# Patient Record
Sex: Male | Born: 1950 | Race: White | Hispanic: No | Smoking: Never smoker
Health system: Southern US, Community
[De-identification: ages and names within clinical notes are randomized; demographics above are authoritative.]

## PROBLEM LIST (undated history)

## (undated) DIAGNOSIS — M4696 Unspecified inflammatory spondylopathy, lumbar region: Secondary | ICD-10-CM

## (undated) DIAGNOSIS — F419 Anxiety disorder, unspecified: Secondary | ICD-10-CM

## (undated) DIAGNOSIS — R040 Epistaxis: Secondary | ICD-10-CM

## (undated) DIAGNOSIS — K219 Gastro-esophageal reflux disease without esophagitis: Secondary | ICD-10-CM

## (undated) DIAGNOSIS — I639 Cerebral infarction, unspecified: Secondary | ICD-10-CM

## (undated) DIAGNOSIS — Z8719 Personal history of other diseases of the digestive system: Secondary | ICD-10-CM

## (undated) DIAGNOSIS — F1021 Alcohol dependence, in remission: Secondary | ICD-10-CM

## (undated) DIAGNOSIS — S42302A Unspecified fracture of shaft of humerus, left arm, initial encounter for closed fracture: Secondary | ICD-10-CM

## (undated) DIAGNOSIS — N318 Other neuromuscular dysfunction of bladder: Secondary | ICD-10-CM

## (undated) DIAGNOSIS — I513 Intracardiac thrombosis, not elsewhere classified: Secondary | ICD-10-CM

## (undated) DIAGNOSIS — M810 Age-related osteoporosis without current pathological fracture: Secondary | ICD-10-CM

## (undated) DIAGNOSIS — F1027 Alcohol dependence with alcohol-induced persisting dementia: Secondary | ICD-10-CM

## (undated) DIAGNOSIS — K7010 Alcoholic hepatitis without ascites: Secondary | ICD-10-CM

## (undated) DIAGNOSIS — G8929 Other chronic pain: Secondary | ICD-10-CM

## (undated) DIAGNOSIS — F1910 Other psychoactive substance abuse, uncomplicated: Secondary | ICD-10-CM

## (undated) DIAGNOSIS — F325 Major depressive disorder, single episode, in full remission: Secondary | ICD-10-CM

## (undated) HISTORY — DX: Major depressive disorder, single episode, in full remission: F32.5

## (undated) HISTORY — DX: Personal history of other diseases of the digestive system: Z87.19

## (undated) HISTORY — DX: Age-related osteoporosis without current pathological fracture: M81.0

## (undated) HISTORY — DX: Alcohol dependence with alcohol-induced persisting dementia: F10.27

---

## 1994-03-10 HISTORY — PX: KNEE SURGERY: SHX244

## 1999-03-11 DIAGNOSIS — R040 Epistaxis: Secondary | ICD-10-CM

## 1999-03-11 DIAGNOSIS — F1910 Other psychoactive substance abuse, uncomplicated: Secondary | ICD-10-CM

## 1999-03-11 DIAGNOSIS — K7010 Alcoholic hepatitis without ascites: Secondary | ICD-10-CM

## 1999-03-11 HISTORY — DX: Other psychoactive substance abuse, uncomplicated: F19.10

## 1999-03-11 HISTORY — DX: Epistaxis: R04.0

## 1999-03-11 HISTORY — DX: Alcoholic hepatitis without ascites: K70.10

## 1999-05-07 ENCOUNTER — Encounter: Payer: Self-pay | Admitting: Emergency Medicine

## 1999-05-07 ENCOUNTER — Emergency Department (HOSPITAL_COMMUNITY): Admission: EM | Admit: 1999-05-07 | Discharge: 1999-05-07 | Payer: Self-pay | Admitting: Emergency Medicine

## 1999-05-08 ENCOUNTER — Inpatient Hospital Stay (HOSPITAL_COMMUNITY): Admission: EM | Admit: 1999-05-08 | Discharge: 1999-05-10 | Payer: Self-pay | Admitting: Emergency Medicine

## 2001-03-10 DIAGNOSIS — I513 Intracardiac thrombosis, not elsewhere classified: Secondary | ICD-10-CM

## 2001-03-10 HISTORY — PX: HAND SURGERY: SHX662

## 2001-03-10 HISTORY — DX: Intracardiac thrombosis, not elsewhere classified: I51.3

## 2001-06-01 ENCOUNTER — Encounter: Payer: Self-pay | Admitting: Emergency Medicine

## 2001-06-01 ENCOUNTER — Inpatient Hospital Stay (HOSPITAL_COMMUNITY): Admission: EM | Admit: 2001-06-01 | Discharge: 2001-06-04 | Payer: Self-pay | Admitting: Emergency Medicine

## 2001-06-12 ENCOUNTER — Ambulatory Visit (HOSPITAL_COMMUNITY): Admission: RE | Admit: 2001-06-12 | Discharge: 2001-06-13 | Payer: Self-pay | Admitting: Orthopedic Surgery

## 2001-06-24 ENCOUNTER — Inpatient Hospital Stay (HOSPITAL_COMMUNITY): Admission: EM | Admit: 2001-06-24 | Discharge: 2001-07-02 | Payer: Self-pay | Admitting: Emergency Medicine

## 2001-06-24 ENCOUNTER — Encounter: Payer: Self-pay | Admitting: Emergency Medicine

## 2001-06-25 ENCOUNTER — Encounter: Payer: Self-pay | Admitting: Internal Medicine

## 2001-09-24 ENCOUNTER — Encounter: Payer: Self-pay | Admitting: Emergency Medicine

## 2001-09-24 ENCOUNTER — Inpatient Hospital Stay (HOSPITAL_COMMUNITY): Admission: EM | Admit: 2001-09-24 | Discharge: 2001-10-04 | Payer: Self-pay | Admitting: Emergency Medicine

## 2001-09-25 ENCOUNTER — Encounter: Payer: Self-pay | Admitting: Internal Medicine

## 2001-09-27 ENCOUNTER — Encounter: Payer: Self-pay | Admitting: Cardiovascular Disease

## 2001-10-04 ENCOUNTER — Inpatient Hospital Stay (HOSPITAL_COMMUNITY)
Admission: RE | Admit: 2001-10-04 | Discharge: 2001-10-13 | Payer: Self-pay | Admitting: Physical Medicine & Rehabilitation

## 2006-12-05 ENCOUNTER — Emergency Department (HOSPITAL_COMMUNITY): Admission: EM | Admit: 2006-12-05 | Discharge: 2006-12-05 | Payer: Self-pay | Admitting: Emergency Medicine

## 2007-12-16 ENCOUNTER — Emergency Department (HOSPITAL_COMMUNITY): Admission: EM | Admit: 2007-12-16 | Discharge: 2007-12-16 | Payer: Self-pay | Admitting: Emergency Medicine

## 2008-03-21 DIAGNOSIS — I6350 Cerebral infarction due to unspecified occlusion or stenosis of unspecified cerebral artery: Secondary | ICD-10-CM

## 2008-03-21 DIAGNOSIS — R32 Unspecified urinary incontinence: Secondary | ICD-10-CM | POA: Insufficient documentation

## 2009-07-25 ENCOUNTER — Emergency Department (HOSPITAL_COMMUNITY): Admission: EM | Admit: 2009-07-25 | Discharge: 2009-07-25 | Payer: Self-pay | Admitting: Emergency Medicine

## 2010-01-02 ENCOUNTER — Ambulatory Visit: Payer: Self-pay | Admitting: Nurse Practitioner

## 2010-01-02 DIAGNOSIS — F1097 Alcohol use, unspecified with alcohol-induced persisting dementia: Secondary | ICD-10-CM

## 2010-01-02 DIAGNOSIS — R630 Anorexia: Secondary | ICD-10-CM

## 2010-01-08 ENCOUNTER — Ambulatory Visit: Payer: Self-pay | Admitting: Nurse Practitioner

## 2010-01-08 DIAGNOSIS — R209 Unspecified disturbances of skin sensation: Secondary | ICD-10-CM

## 2010-01-08 LAB — CONVERTED CEMR LAB
Basophils Absolute: 0 10*3/uL (ref 0.0–0.1)
Basophils Relative: 0 % (ref 0–1)
Cholesterol: 195 mg/dL (ref 0–200)
Eosinophils Absolute: 0.1 10*3/uL (ref 0.0–0.7)
Eosinophils Relative: 1 % (ref 0–5)
HCT: 48.6 % (ref 39.0–52.0)
HDL: 51 mg/dL (ref >39)
Hemoglobin: 16.1 g/dL (ref 13.0–17.0)
LDL Cholesterol: 126 mg/dL — ABNORMAL HIGH (ref 0–99)
Lymphocytes Relative: 35 % (ref 12–46)
Lymphs Abs: 1.8 10*3/uL (ref 0.7–4.0)
MCHC: 33.1 g/dL (ref 30.0–36.0)
MCV: 87.6 fL (ref 78.0–100.0)
Monocytes Absolute: 0.4 10*3/uL (ref 0.1–1.0)
Monocytes Relative: 7 % (ref 3–12)
Neutro Abs: 2.9 10*3/uL (ref 1.7–7.7)
Neutrophils Relative %: 56 % (ref 43–77)
Platelets: 196 10*3/uL (ref 150–400)
RBC: 5.55 M/uL (ref 4.22–5.81)
RDW: 13.3 % (ref 11.5–15.5)
TSH: 2.058 microintl units/mL (ref 0.350–4.500)
Total CHOL/HDL Ratio: 3.8
Triglycerides: 91 mg/dL (ref <150)
VLDL: 18 mg/dL (ref 0–40)
Vitamin B-12: 378 pg/mL (ref 211–911)
WBC: 5.1 10*3/uL (ref 4.0–10.5)

## 2010-01-09 ENCOUNTER — Encounter (INDEPENDENT_AMBULATORY_CARE_PROVIDER_SITE_OTHER): Payer: Self-pay | Admitting: Nurse Practitioner

## 2010-01-10 ENCOUNTER — Ambulatory Visit: Payer: Self-pay | Admitting: Nurse Practitioner

## 2010-01-10 DIAGNOSIS — I959 Hypotension, unspecified: Secondary | ICD-10-CM

## 2010-01-15 ENCOUNTER — Encounter (INDEPENDENT_AMBULATORY_CARE_PROVIDER_SITE_OTHER): Payer: Self-pay | Admitting: Nurse Practitioner

## 2010-02-20 ENCOUNTER — Encounter (INDEPENDENT_AMBULATORY_CARE_PROVIDER_SITE_OTHER): Payer: Self-pay | Admitting: Nurse Practitioner

## 2010-04-11 NOTE — Letter (Signed)
Summary: PT INFORMATION SHEET  PT INFORMATION SHEET   Imported By: Arta Bruce 01/03/2010 14:38:12  _____________________________________________________________________  External Attachment:    Type:   Image     Comment:   External Document

## 2010-04-11 NOTE — Letter (Signed)
Summary: Lipid Letter  [facility]  [address] [place], [place] [address]   Phone: [phone]  Fax: [phone]    01/09/2010  [name] [address] [place], [place]  [address]  Dear Steven [name]:  We have carefully reviewed your last lipid profile from 01/08/2010 and the results are noted below with a summary of recommendations for lipid management.    Cholesterol:       195     Goal: less than 200   HDL "good" Cholesterol:   51     Goal: greater than 40   LDL "bad" Cholesterol:   126     Goal: less than 130   Triglycerides:       91     Goal: less than 150    Labs done during recent office visit are normal.     Current Medications:  None If you have any questions, please call. We appreciate being able to work with you.   Sincerely,     Lehman Prom, FNP [facility]

## 2010-04-11 NOTE — Miscellaneous (Addendum)
Summary: RESCANNED W/DATES/AUTHORIZATION FOR HEALTH INFORMATION  RESCANNED W/DATES/AUTHORIZATION FOR HEALTH INFORMATION   Imported By: Arta Bruce 01/02/2010 15:53:34  _____________________________________________________________________  External Attachments:     1. Type:   Image          Comment:   External Document    2. Type:   Image          Comment:   External Document

## 2010-04-11 NOTE — Assessment & Plan Note (Signed)
Summary: S/P Fall  Nurse Visit   Vital Signs:  Patient profile:   60 year old male Temp:     97.6 degrees F oral Pulse rate:   108 / minute Pulse rhythm:   regular Resp:     20 per minute BP sitting:   96 / 62  (right arm) Cuff size:   regular  Vitals Entered By: Dutch Quint RN (January 10, 2010 10:24 AM)  CC:  S/p fall and injured ear.  History of Present Illness: Was here to have PPD read, sister states that pt. had fall night before last, injuring left ear.     Review of Systems Derm:  Complains of changes in color of skin; Bruised left ear with bleeding to external ear.   Sister states that he refused to allow ice or treatment to ear.Marland Kitchen   Physical Exam  Ears:  Left pinna has denuded area, actively bleeding.  Entire ear is bruised with some swelling.  Direct pressure applied and bleeding stopped. Lungs:  normal respiratory effort, no crackles, no wheezes, and L base dullness.    CC: S/p fall, injured ear Is Patient Diabetic? No Pain Assessment Patient in pain? yes     Location: left ear Type: sharp Onset of pain  s/p fall 01/08/10  Does patient need assistance? Ambulation Impaired:Risk for fall Comments Uses cane sporadically   Patient Instructions: 1)  Seen by Jesse Fall 2)  Tetanus vaccine given 3)  PPD was negative 4)  Blood pressure was low -- make sure you stand up and change position slowly; that could create another fall if your blood pressure drops too low. 5)  You are taking Plavix, which could cause increased bleeding.  Apply direct pressure to the ear if it starts to bleed again. 6)  You can take Tylenol regular strength 325 mg. two tabs by mouth every 4-6 hours.  If you take Extra Strength Tylenol 500 mg., take only one tablet.   7)  This office will let you know when your form is complete. 8)  Call if anything changes or if you have any questions.   Impression & Recommendations:  Problem # 1:  ACCIDENTAL FALL (ICD-E888.9) Trauma to left ear,   denuded area bleeding, stopped with direct pressure.  Taking Plavix.  Had been given aspirin for headache by sister, advised to use Tylenol.  Problem # 2:  HYPOTENSION (ICD-458.9) BP is low -- may be what contributed to fall Advised on care with postural changes  Complete Medication List: 1)  Plavix 75 Mg Tabs (Clopidogrel bisulfate) .... Take one tab daily 2)  Vesicare 5 Mg Tabs (Solifenacin succinate)  Other Orders: Tdap => 32yrs IM ([address]) Admin 1st Vaccine ([address])   Allergies: 1)  ! Penicillin  Immunizations Administered:  Tetanus Vaccine:    Vaccine Type: Tdap    Site: left deltoid    Mfr: Sanofi Pasteur    Dose: 0.5 ml    Route: IM    Given by: Dutch Quint RN    Exp. Date: 03/27/2012    Lot #: [id number]    VIS given: 01/26/08 version given January 10, 2010.  Orders Added: 1)  Est. Patient Level II [99212] 2)  Tdap => 47yrs IM [90715] 3)  Admin 1st Vaccine [47829]

## 2010-04-11 NOTE — Assessment & Plan Note (Signed)
Summary: NEW - Establish Care   Vital Signs:  Patient profile:   60 year old male Height:      62 inches Weight:      138 pounds BMI:     25.33 Temp:     98.1 degrees F oral Pulse rate:   88 / minute Pulse rhythm:   regular Resp:     18 per minute BP sitting:   110 / 84  (left arm) Cuff size:   regular  Vitals Entered By: Arm[place] Shannon (January 02, 2010 2:12 PM)  Nutrition Counseling: Patient's BMI is greater than 25 and therefore counseled on weight management options. CC: pt sister would like pt to be placed in an assist living...  pt needs a tb skin test... sister would like pt's b12 checked... sister says he takes vesicare and paxil but did not bring meds... Is Patient Diabetic? No Pain Assessment Patient in pain? no       Does patient need assistance? Functional Status Self care Ambulation Impaired:Risk for fall   CC:  pt sister would like pt to be placed in an assist living...  pt needs a tb skin test... sister would like pt's b12 checked... sister says he takes vesicare and paxil but did not bring meds....  History of Present Illness:  Pt into the office to establish care. No permanent PCP - he has been seeing several [facility] in [place] He was living at home and had some caretakers who the sister reports was taking him to several doctors.  Sister present with pt today and she states that she wants to get pt into the Assisted LIving. He is now living with her at this time. reports that he has a recent history of ETOH and drug use in his previous residence she moved him with her to separate him from that environment  Pt did not bring his medication into the office today She is unsure of the pt's history  Social - History of ETOH abuse but is now down to drinking 2 beers every other day (sister monitors ETOH use) Pt has a motorized wheelchair but did not present today with it in the office.  He does have a cane that he uses when he can. Pt does prefers to walk  rather than use the wheelchair however he has had several falls over the past few months Pt does need help getting dressed.  Habits & Providers  Alcohol-Tobacco-Diet     Alcohol drinks/day: 2     Alcohol Counseling: to decrease amount and/or frequency of alcohol intake     Alcohol type: beer     Tobacco Status: never  Exercise-Depression-Behavior     Does Patient Exercise: no     Have you felt down or hopeless? no     Have you felt little pleasure in things? no     Depression Counseling: not indicated; screening negative for depression     Drug Use: past  Allergies: 1)  ! Penicillin  Family History: mother - diabetes (deceased at age 54) father - diabetes (alive at 9) sister - htn  Social History: none ETOH - 2 beers per day Drug - previous cocaine use (last 2010) Tobacco - none Smoking Status:  never Does Patient Exercise:  no Drug Use:  past  Review of Systems General:  Complains of loss of appetite; "I'm just not hungry" Sister prepares breakfast and supper.  he can fix his own food during the day. . CV:  Denies chest pain or  discomfort. Resp:  Complains of cough; intermittent. GI:  Complains of diarrhea; sister has restriced chocolate and whole milk and diarrhea has improved. GU:  Complains of incontinence; wears incontinence pads. Neuro:  Complains of memory loss; denies numbness and tingling; some days pt has problems with memory - he is not able to recall the names of people in the family.  Physical Exam  General:  alert.   Eyes:  glasses Lungs:  normal breath sounds.   Heart:  normal rate and regular rhythm.   Neurologic:  cane use left sided weakness (hemiparesis) hobbling gait   Impression & Recommendations:  Problem # 1:  CVA (ICD-434.91) need to get full history from previous PCP  Problem # 2:  LOSS OF APPETITE (ICD-783.0) ? if due to hx of substance abuse will continue to workup  Problem # 3:  NEED PROPHYLACTIC VACCINATION&INOCULATION FLU  (ICD-V04.81) given today in office  Other Orders: Flu Vaccine 19yrs + ([address]) Admin 1st Vaccine ([address])  Patient Instructions: 1)  Sign a release to get records from [facility] 2)  Sign a release to get records from office on [address] 3)  I need the records from the previous providers before I can complete the FL2 form with this history and meds 4)  Schedule a nurse visit on Tuesday morning - will need to PPD 5)   come fasting for labs (no food after midnight before this visit). 6)  Will need lipids, cbc, cmp, tsh, vit b 12, rapid hiv 7)  Bring all your bottles of medications into this office 8)  Schedule nurse visit on Thursday or Friday morning for PPD reading.   Orders Added: 1)  Flu Vaccine 39yrs + [90658] 2)  Admin 1st Vaccine [90471] 3)  New Patient Level III [99203]   Immunizations Administered:  Influenza Vaccine # 1:    Vaccine Type: Fluvax 3+    Site: right deltoid    Mfr: GlaxoSmithKline    Dose: 0.5 ml    Route: IM    Given by: Levon Hedger    Exp. Date: 09/07/2010    Lot #: [id number]    VIS given: 10/02/09 version given January 02, 2010.  Flu Vaccine Consent Questions:    Do you have a history of severe allergic reactions to this vaccine? no    Any prior history of allergic reactions to egg and/or gelatin? no    Do you have a sensitivity to the preservative Thimersol? no    Do you have a past history of Guillan-Barre Syndrome? no    Do you currently have an acute febrile illness? no    Have you ever had a severe reaction to latex? no    Vaccine information given and explained to patient? yes    ndc [phone]  Immunizations Administered:  Influenza Vaccine # 1:    Vaccine Type: Fluvax 3+    Site: right deltoid    Mfr: GlaxoSmithKline    Dose: 0.5 ml    Route: IM    Given by: Levon Hedger    Exp. Date: 09/07/2010    Lot #: [id number]    VIS given: 10/02/09 version given January 02, 2010.  Prevention & Chronic  Care Immunizations   Influenza vaccine: Fluvax 3+  (01/02/2010)    Tetanus booster: Not documented    Pneumococcal vaccine: Not documented  Colorectal Screening   Hemoccult: Not documented    Colonoscopy: Not documented  Other Screening   PSA: Not documented   Smoking status: never  (  01/02/2010)  Lipids   Total Cholesterol: Not documented   LDL: Not documented   LDL Direct: Not documented   HDL: Not documented   Triglycerides: Not documented   Nursing Instructions: Give Flu vaccine today

## 2010-04-11 NOTE — Letter (Signed)
Summary: REQUESTING RECORDS FROM [facility]  REQUESTING RECORDS FROM [facility]   Imported By: Arta Bruce 01/17/2010 15:28:30  _____________________________________________________________________  External Attachment:    Type:   Image     Comment:   External Document

## 2010-04-11 NOTE — Miscellaneous (Signed)
Summary: AUTHORIZATION TO RELEASE MEDICAL INFORMATION TO SISTER  AUTHORIZATION TO RELEASE MEDICAL INFORMATION TO SISTER   Imported By: Arta Bruce 01/02/2010 14:15:44  _____________________________________________________________________  External Attachment:    Type:   Image     Comment:   External Document

## 2010-04-25 ENCOUNTER — Encounter (INDEPENDENT_AMBULATORY_CARE_PROVIDER_SITE_OTHER): Payer: Self-pay | Admitting: Nurse Practitioner

## 2010-04-25 DIAGNOSIS — F419 Anxiety disorder, unspecified: Secondary | ICD-10-CM | POA: Insufficient documentation

## 2010-04-25 DIAGNOSIS — S42309A Unspecified fracture of shaft of humerus, unspecified arm, initial encounter for closed fracture: Secondary | ICD-10-CM | POA: Insufficient documentation

## 2010-04-25 DIAGNOSIS — M47817 Spondylosis without myelopathy or radiculopathy, lumbosacral region: Secondary | ICD-10-CM | POA: Insufficient documentation

## 2010-04-25 DIAGNOSIS — K219 Gastro-esophageal reflux disease without esophagitis: Secondary | ICD-10-CM | POA: Insufficient documentation

## 2010-04-25 DIAGNOSIS — G894 Chronic pain syndrome: Secondary | ICD-10-CM | POA: Insufficient documentation

## 2010-05-01 NOTE — Letter (Signed)
Summary: RECEIVED RECORDS FROM [facility]  RECEIVED RECORDS FROM [facility]   Imported By: Arta Bruce 04/26/2010 09:32:45  _____________________________________________________________________  External Attachment:    Type:   Image     Comment:   External Document

## 2010-05-01 NOTE — Miscellaneous (Signed)
Summary: Hx update  Clinical Lists Changes  Problems: Changed problem from CVA (ICD-434.91) to CVA (ICD-434.91) Added new problem of URINARY INCONTINENCE (ICD-788.30) Added new problem of CHRONIC PAIN SYNDROME (ICD-338.4) Added new problem of ANXIETY (ICD-300.00) Added new problem of History of  FRACTURE, HUMERUS, LEFT (ICD-812.20) Added new problem of SPONDYLOSIS, LUMBAR (ICD-721.3) Added new problem of GERD (ICD-530.81)

## 2010-07-16 ENCOUNTER — Emergency Department (HOSPITAL_COMMUNITY)
Admission: EM | Admit: 2010-07-16 | Discharge: 2010-07-16 | Disposition: A | Discharge status: Home / Self Care | Payer: Medicaid Other | Attending: Emergency Medicine | Admitting: Emergency Medicine

## 2010-07-16 ENCOUNTER — Emergency Department (HOSPITAL_COMMUNITY): Payer: Medicaid Other

## 2010-07-16 DIAGNOSIS — S8000XA Contusion of unspecified knee, initial encounter: Secondary | ICD-10-CM | POA: Insufficient documentation

## 2010-07-16 DIAGNOSIS — Z8673 Personal history of transient ischemic attack (TIA), and cerebral infarction without residual deficits: Secondary | ICD-10-CM | POA: Insufficient documentation

## 2010-07-16 DIAGNOSIS — M25569 Pain in unspecified knee: Secondary | ICD-10-CM | POA: Insufficient documentation

## 2010-07-16 DIAGNOSIS — M542 Cervicalgia: Secondary | ICD-10-CM | POA: Insufficient documentation

## 2010-07-16 DIAGNOSIS — G8929 Other chronic pain: Secondary | ICD-10-CM | POA: Insufficient documentation

## 2010-07-16 DIAGNOSIS — Y9210 Unspecified residential institution as the place of occurrence of the external cause: Secondary | ICD-10-CM | POA: Insufficient documentation

## 2010-07-16 DIAGNOSIS — S0990XA Unspecified injury of head, initial encounter: Secondary | ICD-10-CM | POA: Insufficient documentation

## 2010-07-16 DIAGNOSIS — W050XXA Fall from non-moving wheelchair, initial encounter: Secondary | ICD-10-CM | POA: Insufficient documentation

## 2010-07-16 DIAGNOSIS — R51 Headache: Secondary | ICD-10-CM | POA: Insufficient documentation

## 2010-07-26 NOTE — Consult Note (Signed)
[facility]  Patient:    Steven [name], Steven [name]                       MRN: [id number] Proc. Date: 05/09/99 Adm. Date:  [id number] Attending:  Farley Ly                          Consultation Report  REASON FOR CONSULTATION:  Epistaxis.  HISTORY OF PRESENT ILLNESS:  This is a 60 year old who was evaluated in the emergency department a few nights ago with severe epistaxis on the left side. e had some treatment performed and then was sent home and started bleeding again nd bled throughout the night.  He was admitted to the hospital the next day. Hemoglobin was found to be 8.6.  He just finished receiving two units of red cells and has not been rechecked as of yet.  He has not had an further bleeding in about 24 hours now.   He has a history of cocaine abuse.   The last time he used it was about one month ago.  He also has a history of alcohol abuse.  No prior history of nose bleeds or nasal trauma.  PAST MEDICAL HISTORY: Otherwise negative.  MEDICATIONS:  No medications except occasional aspirin.  SOCIAL HISTORY:  Positive for alcohol and cocaine abuse.  No history of smoking.  PHYSICAL EXAMINATION:  Generally healthy appearing gentleman without any significant bleeding currently.  HEENT:   Oral cavity and pharynx of any abnormalities.  The dentition is sparse and in very poor condition.  Nasal exam reveals some deviation of the septum with a  large caliber vessel visible just below the mucosa of the anterior septum on the left side.   The are some cautery marks on the anterior edge of the left nasal vestibule, just behind the mucocutaneous junction.  Nasal endoscopy was performed and there was no other bleeding site identified or potential bleeding site identified.  The remainder of the head and neck exam was unremarkable.  IMPRESSION:  Severe epistaxis with anemia.   Possibly related to cold, dry air;  possibly related to cocaine  use.  Recommend cauterization of the vessel in the eft anterior septum.  This was then performed using topical cocaine anesthetic and ith silver nitrate.  The vessel was completely obliterated using this method. Recommend saline nasal spray several times a day to keep the nose moist and I will see him again if he starts having any further bleeding. DD:  05/09/99 TD:  05/09/99 Job: [id number]

## 2010-07-26 NOTE — Discharge Summary (Signed)
NAME:  Steven [name], Steven [name]                          ACCOUNT NO.:  [id number]   MEDICAL RECORD NO.:  [id number]                   PATIENT TYPE:  IPS   LOCATION:  4038                                 FACILITY:  [facility]   PHYSICIAN:  Bless Lisenby DICTATOR                    DATE OF BIRTH:  10-12-1950   DATE OF ADMISSION:  10/04/2001  DATE OF DISCHARGE:  10/13/2001                                 DISCHARGE SUMMARY   DISCHARGE DIAGNOSES:  1. 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  2. LAA clot.  3. Hypertension.  4. History of polysubstance abuse.   HISTORY OF PRESENT ILLNESS:  Steven [name] is a 60 year old right handed male  with a history of hypertension and polysubstance abuse, admitted on September 24, 2001 with a four day history of left lower extremity pain and left lower  extremity weakness. MRI and MRA done and showed pronounced atrophy with  chronic small vessel disease and a number of acute infarcts in each  hemispheres, worrisome for cardiac aortic fills. No stenosis noted. Carotid  duplex done showed no ICA stenosis. Cardiac echo was inadequate to rule out  left atrial thrombus. EF of 55-65%. TEE was therefore done showing small  density clots in LAA with possible thrombus in base of LAA. [facility] has been  following the patient and felt that the patient was a poor anticoagulation  candidate secondary to his poor insight and history of drug and alcohol  abuse. He was placed on aspirin for CVA prophylaxis. PT was initiated and  the patient was noted to be minimal assist for transfers and minimal assist  by family of 75 feet with walker. Problems advancing left lower extremity.   PAST MEDICAL HISTORY:  1. Hypertension.  2. History of bilateral hand crushing with compartment syndrome of right     upper extremity.  3. History of alcoholic hepatitis.  4. History of cocaine, alcohol, and marijuana abuse.   ALLERGIES:  ANCEF.   SOCIAL HISTORY:  The patient lives with a  girlfriend in a one level home  with three steps at entry. Was independent prior to admission. Denies  tobacco use and drug use. Reports that he drinks six packs on the weekend.   HOSPITAL COURSE:  Mr. Carlis Blanchard was admitted to rehab on October 04, 2001  for inpatient therapies to consist of PT, OT, and speech therapy daily. The  blood pressures were monitored on a bid basis and have been reasonably  stable. He is noted to have some tachycardia with increased activity. No  complaints of chest pain or pressure dyspnea reported. The patient was  maintained on folic acid for his history of drug and alcohol abuse. Plavix  was added additionally to aspirin for CVA prophylaxis. At the time of  admission, the patient was noted to have some confusion. He was able to  follow two step commands with  50% accuracy. He was noted to have mild left  hemiparesis. Speech therapy evaluation showed the patient with mild  cognitive linguistic deficits, minimal attention,  high level of multi-  process reasoning, short term memory deficits, as well as in reading and  comprehension. The patient is noted to have extreme deviation with decrease  in left knee flexion and hip hike. He has demonstrated the ability to safely  ambulate despite the deviation. He was supervision for decrease in safety  awareness at times. The patient is modified independence for ADLs, modified  independence for ambulating. Further follow-up therapy to include home  health, PT, and OT advanced home care post discharge.   DISPOSITION:  On October 13, 2001, the patient is discharged to home. Samples  for a month supply of Plavix given to the patient. The patient has been  instructed to follow-up with Dr. Meredith Pel in the next three to four weeks for  post hospital recheck and has been advised to continue his medications as  directed.   DISCHARGE MEDICATIONS:  1. Folic acid 1 mg daily.  2. Coated aspirin 325 mg per day.  3. Thiamine 100 mg  daily.  4. Plavix 75 mg daily.  5. May use alcohol as needed for pain management.   ACTIVITY:  Supervision.   SPECIAL INSTRUCTIONS:  Absolutely no alcohol, no smoking, no drugs, and no  driving.   FOLLOW UP:  [facility] to provide PT and OT. The patient to follow-  up with Dr. Thomasena Edis on December 02, 2001 at 12:30 p.m. and follow-up with  Dr. Meredith Pel in two to three weeks.                                               Quashon Jesus DICTATOR    DD/MEDQ  D:  10/13/2001  T:  10/18/2001  Job:  [id number]   cc:   Ileana Roup, M.D.

## 2010-07-26 NOTE — Discharge Summary (Signed)
[place]. [facility]  Patient:    Steven [name], Steven [name] Visit Number: [id number] MRN: [id number]          Service Type: MED Location: [phone] Attending Physician:  Farley Ly Admit Date:  09/24/2001 Disc. Date: 10/04/01   CC:         Rehab Unit   Discharge Summary  DATE OF BIRTH:  [birth year]  DISCHARGE DIAGNOSES: 1. Cerebrovascular accident with multiple infarcts. 2. Alcohol abuse. 3. Hypokalemia. 4. Thrombocytopenia. 5. Hypertension.  DISCHARGE MEDICATIONS: 1. Aspirin 325 mg p.o. q.d. 2. Thiamine 100 mg p.o. q.d. 3. Folic acid 1 mg p.o. q.d.  CHIEF COMPLAINT:  Left leg weakness.  HISTORY OF PRESENT ILLNESS:  This is a 60 year old white male with no significant past medical history except bilateral arm fractures with fixation following injury while at work.  The patient presents with a four-day history of left leg weakness, states that he woke up on September 20, 2001 with weakness in left leg.  States that he has fallen approximately five times since this initially happened; "my foot is dragging."  He did have some pain in his left hip for the last few months.  He has also had some urinary incontinence x2 months.  He has never had any similar events like this one before.  He denies any blackouts, any loss of consciousness with falls.  He may have hit his head on cabinet in bathroom with one of these falls.  He denies any pain in his left leg and he feels as though he cannot put any weight on his left leg.  PAST MEDICAL HISTORY: 1. Bilateral fixation of fractures in wrists sustained on the job where he    works.  The surgical repair was done March 2003 by Dr. Teressa Senter. 2. Hypertension. 3. Severe epistaxis, 2001, followed by Dr. Pollyann Kennedy. 4. Mental status changes, April 2003, questionable secondary to benzo or ETOH    withdrawal. 5. ETOH abuse. 6. History of cocaine abuse.  FAMILY HISTORY:  Father is 51 years old and healthy; only history  of hypertension.  Mother had hypertension, diabetes mellitus, and a CVA.  She died around the age of 2.  SOCIAL HISTORY:  Lives in [place] with girlfriend.  Denies any tobacco use. ETOH:  Has two six-packs a week for about 10 years.  Denies any IV drug use.  MEDICATIONS ON ADMISSION:  None.  ALLERGIES:  ANCEF.  REVIEW OF SYSTEMS:  Essentially negative except for incontinence.  PHYSICAL EXAMINATION ON ADMISSION:  VITAL SIGNS:  Temperature 97.8, blood pressure 107/85, pulse 109, respirations 20.  GENERAL:  Older-than-stated-age white male, alert and oriented x3, no acute distress.  HEENT:  Oropharynx clear.  No retinal hemorrhages.  Pupils equal, round, and reactive to light and accommodation.  Slight reactive exophthalmos on right. No lymphadenopathy, no facial muscle weakness, no JVD.  CARDIOVASCULAR:  Slightly tachycardic, regular rate.  No murmurs, gallops, or rubs.  LUNGS:  Clear to auscultation bilaterally.  ABDOMEN:  Soft, nontender, nondistended, positive bowel sounds.  EXTREMITIES:  Strength 5/5 on the right lower extremity; 4+/5 on left lower extremity.  Sensation decreased to pinprick dorsum of left foot up to ankle. Pulses 2+ bilaterally, bilaterally downgoing toes, and 2+ reflexes lower extremities.  Internal rotation of left leg and unsteady gait.  NEUROLOGIC:  Cranial nerves II-XII intact.  Mini mental status exam 25.  No facial weakness or deficits noted.  LABORATORY DATA ON ADMISSION:  UA 1.005, pH 5.5, all other  findings negative. WBC 4.2, HGB 15.5, platelets 130.  Sodium 134, potassium 3.3, chloride 100, bicarb 26, BUN 7, creatinine 0.6, glucose 116, total protein 7.1, albumin 3.9, AST 22, ALT 19, alk phos 55, T bili 0.5.  PT 13.0, INR 0.9, PTT 28.  Alcohol level found to be 16.  Procedures: 1. CT of head showed old lacunar stroke, no acute changes. 2. Carotid Doppler:  No ICA stenosis bilaterally. 3. MRI showed a number of acute infarcts in each  hemisphere; subcentimeter    strokes bilaterally in right thalmus, right basal ganglia, right corona    radiata, left caudate head, left white matter scattered. 4. MRA negative - intracranial. 5. A 2-D echo:  LVEF 55-65%. 6. Left hip:  Hip joints appear normal. 7. TEE:  Small 3-4 mm mobile density attached to LAA, possible thrombus.  PHYSICAL EXAMINATION ON DISCHARGE:  VITAL SIGNS:  Temperature 97.2, blood pressure 118/97, pulse 90, respirations 18, 96% on room air.  CARDIOVASCULAR:  Regular rate and rhythm, no murmurs.  LUNGS:  Clear to auscultation bilaterally.  ABDOMEN:  Soft, nontender, nondistended, positive bowel sounds.  EXTREMITIES:  No edema.  NEUROLOGIC:  Continues to have left leg weakness and internal rotation; no change since admission.  DISCHARGE LABORATORY DATA:  Labs last before discharge - October 01, 2001:  WBC 5.7, HGB 15.8, platelets 150.  Sodium 135, potassium 3.7, chloride 95, bicarb 28, BUN 9, creatinine 0.7, glucose 107, calcium 10.4.  HOSPITAL COURSE: #1 - CEREBROVASCULAR ACCIDENT WITH MULTIPLE INFARCTS:  The patient was initially seen in the emergency department with this left internal rotation of his leg and complaint of weakness in the left leg.  No other symptoms noted on admission.  A CT of head was obtained which showed old lacunar stroke but no acute changes.  MRA was ordered at that time which showed a number of acute infarcts; please refer to above for results.  These MRI results were indicative of a questionable cardiac source due to the multiple miniature strokes found.  A 2-D echo was obtained which essentially did not give any result for a cardiac source.  It did show the left ventricular EF of 55-65%. A TEE was obtained which did show a small 3-4 mm mobile density attached to the left atrial appendage could possibly be a thrombus.  The patient was initially approached about anticoagulation and initially was in agreement  to  anticoagulation, given the risks associated with bleeding related to his falls.  He was in agreement about having to follow up multiple times for Coumadin replacement as well as cessation of his ETOH and substance abuse.  He initially promised that he would cooperate with rehab, abstinence from ETOH and drugs, and appropriate medical follow-up, but quickly changed his mind. The patient appeared to have limited insight into exactly what the treatment would entail.  It was discussed with the team that anticoagulation would pose a substantial risk and be very difficult to use in this patients case, although his risk of stroke is also very substantial given findings on the TEE and MRI.  At this time, do not believe that it would be safe to anticoagulate him unless he was able to agree to further follow-up.  For this reason, we continued him on just antiplatelet therapy with aspirin; no other therapy suggested at this time.  While on rehab he may be approached again with the possibility of starting on Coumadin with the express knowledge that he will require multiple follow-ups for this and continual  treatment.  #2 - ALCOHOL ABUSE:  The patient states that he has significant alcohol consumption in the past several years and appears to have no insight into risk factors associated with heavy drinking.  He shows no interest in stopping ETOH consumption at this time.  #3 - HYPOKALEMIA:  On admission his potassium was 3.3.  Was given a one-time dose of K-Dur 40 mEq which brought his potassium up to a level in the normal range.  On discharge his potassium level was 3.7.  #4 - THROMBOCYTOPENIA:  His initial platelet count was 130.  This is probably secondary to his ETOH use.  It continued to increase slightly while in the hospital and on discharge it was 150.  #5 - HYPERTENSION:  Several times during admission his blood pressure reached in the 150s range over 100s range but it would slowly  come down to approximately the 100s range over 80s range.  No therapy was suggested at this time.  Continued monitoring of blood pressure will be essential for health maintenance.  His blood pressure on discharge was 118/97.  DISPOSITION:  The patient initially threatened to leave AMA and was really not interested in rehab but after sitting down and talking with him about the possibility of him going home and how he would take care of himself, and the problems he was having with his walking, he agreed that staying for rehab would be his best option.  The patient was transferred to rehab for further evaluation.  HOSPITAL FOLLOW-UP:  Once able to leave rehab may set up a follow-up appointment with Dr. Margo Aye in the outpatient clinic at [facility], number is [phone]. Attending Physician:  Farley Ly DD:  10/04/01 TD:  10/04/01 Job: [phone]

## 2010-07-26 NOTE — Discharge Summary (Signed)
[place]. [facility]  Patient:    Steven [name], Steven [name] Visit Number: [id number] MRN: [id number]          Service Type: MED Location: 5000 [id number] Attending Physician:  Alfonso Ramus Dictated by:   Harrold Donath, M.D. Admit Date:  06/24/2001 Discharge Date: 07/02/2001                             Discharge Summary  CONTINUATION:  ADDENDUM:  The patient also was told to follow up with Dr. Teressa Senter, the orthopedic surgeon who set his arm.  He is also arranged to have home health PT and OT. Dictated by:   Harrold Donath, M.D. Attending Physician:  Alfonso Ramus DD:  07/23/01 TD:  07/27/01 Job: [id number]

## 2010-07-26 NOTE — Op Note (Signed)
[place]. [facility]  Patient:    Steven [name], Steven [name] Visit Number: [id number] MRN: [id number]          Service Type: MED Location: MICU 2107 [id number] Attending Physician:  Steven [name] Dictated by:   Steven [name], M.D. Admit Date:  06/24/2001   CC:         Steven [name], M.D.  Steven [name]., M.D.   Operative Report  DATE OF BIRTH:  November 06, 1950  I was asked to see the patient today at the [facility] by the medicine service in regards to the patient. The patient is a 60 year old male who underwent a crushing injury to his bilateral hand and wrist regions. He was taken to surgery June 01, 2001, by Dr. Josephine [name]. He was taken back to the operative suite on June 12, 2001, for secondary reconstruction about the right hand. I have reviewed these notes at length. I have tried to contact Dr. Teressa [name] as well as his patients family. Dr. Teressa [name] is unavailable. I have thoroughly reviewed this patients history through the charts. This patient was admitted to the hospital on June 24, 2001, and was noted to be unresponsive. Secondary to being seen in the emergency room, he was admitted by the medicine service and has been followed since that time. I have discussed this with medicine service who asked me to see the patient. I was asked to see the patient on June 26, 2001. The patient has been fairly unresponsive. He has spiked a high fever during this hospital course. At the present time, he is noted to have a temperature of 100.4 with a T-max over the last 24 hours of 102.6. He is on Rocephin and acyclovir at present. He has previously been on Tequin and Zithromax but is currently off of the Tequin and Zithromax.  I have discussed his predicament with Dr. Bettye [name].  The patient has had a negative CT scan of his brain. Multiple old infarcts were noted. His white blood cell count is 6.8. His hematocrit is 35.9.  His platelets are 105. He has had a CSF which was not particularly remarkable in terms of the lumbar puncture results. I should note he has been with a normal white count since the time of his admission. His platelet count has gone from 150 to 105. His coag studies are normal. His sedimentation rate is 30. His urine drug screen is significant for cocaine and marijuana. I have been asked to examine his upper extremities due to the multiple surgeries he underwent to rule out any source of infection in the upper extremities that is contributing to his current unresponsiveness/mental status change, etc.  At the present time, the patient has a temperature of 100.4. He is breathing on his own and oxygenating well. He is unresponsive to command. He does respond to a sternal rub.  I have very carefully examined the hands, taking care to not manipulate the hand, wrist, or forearm on either extremity in an aggressive fashion. I have been cognizant of his prior surgeries and injuries in terms of his examination, etc.  On examining the left upper extremity, I have carefully removed Orthoplast splints and following removal of the Orthoplast splint, cleansed the hands. The patient had some devitalized skin within the web spaces. We washed his hands at bedside nicely. He has a well-healed incision about the volar midline including the carpel tunnel release extension. The patient has no erythema, fullness, signs of infection,  or vascular compromise. Thenar, hypothenar, and midpalmar space are soft; forearm is soft; there is no evidence of compartment syndrome or infection at the present time. The elbow is nontender. The patient does not use the fingers or wrist at the present time. The patient certainly is unable to give a motor or sensory examination due to his altered mental state. I see no evidence of infection at the present time. I have specifically tried to evaluate the patient to see if he has  any type of deep situated abscess and currently do not see any signs of this. Following thoroughly examining the patient, I then gently placed new stocking net on the arm after washing it and then placed a sugar tong OCL splint well-molded without difficulty. He tolerated this well.  Following this, I examined the right upper extremity. The patient has an IV access site in the right arm. The patient has pin protruding through the thumb, and pin visible in the 5th metacarpal region on the ulnar aspect of the wrist and hand. The patient has normal splay of the fingers. There is some swelling. I have removed the digital sleeves about the fingers. The patient has no obvious ulcers, breakdown, foul smell, erythema, or deeply situated abscess. There is no fluctuance in the soft tissues. Thenar and midpalmars as well as hypothenar space have no obvious evidence of abscess. His incisions have no dehiscence, and I see no evidence of obvious infection at the present time. I did not manipulate the hand aggressively. I was very careful when moving the wrist and was very protective of the MCP joints. At the present time, he has vascular integrity to the hand and no signs of compromise. It is absolutely impossible to perform a motor or sensory examination on the patient at the present time given his altered mental status.  I have reviewed his current medicines and his studies to date. I have gone over all of these issues in detail.  At the present time, I did not see any signs of an obvious deep abscess about the upper extremities including the wrist and hand region. I have examined him carefully and thoroughly and see no erythema, fluctuance, dehiscence, or warmth to the areas. It is always possible with multiple upper extremity injuries that a focus of osteomyelitis can develop, however, it would be very rare for osteomyelitis to present in this fashion. The patient has been on  appropriate  antibiotics throughout his course, and at the present time, the wounds look fairly benign.  I will continue to try and contact Dr. Teressa [name] to make him aware of all issues. This is a holiday weekend. The patient will be followed closely while in the hospital. I have discussed these issues with family members who came late in his examination. All questions have been encouraged and answered. Dictated by:   Steven [name], M.D. Attending Physician:  Steven [name] DD:  06/26/01 TD:  06/28/01 Job: [id number]

## 2010-07-26 NOTE — Op Note (Signed)
. [facility]  Patient:    Steven [name], Steven [name] Visit Number: [id number] MRN: [id number]          Service Type: DSU Location: 5000 [phone] Attending Physician:  Susa Day Dictated by:   [name], M.D. Proc. Date: 06/12/01 Admit Date:  06/12/2001 Discharge Date: 06/13/2001                             Operative Report  PREOPERATIVE DIAGNOSIS: 1. Status post complex crushing injury of right hand with comminuted fracture    of right thumb, proximal phalanx, right index metacarpal head with complete    disruption of metacarpal head articular surface. 2. Comminuted right long finger metacarpal shaft fracture. 3. Comminuted right ring finger metacarpal neck and head fracture.  POSTOPERATIVE DIAGNOSIS: 1. Status post complex crushing injury of right hand with comminuted fracture    of right thumb, proximal phalanx, right index metacarpal head with complete    disruption of metacarpal head articular surface. 2. Comminuted right long finger metacarpal shaft fracture. 3. Comminuted right ring finger metacarpal neck and head fracture.  OPERATION PERFORMED: 1. Implant hemiarthroplasty reconstruction of right index finger metacarpal    head with pyrocarbon implant. 2. Open reduction internal fixation of right long finger metacarpal fracture. 3. Open reduction internal fixation of right ring finger metacarpal head and    neck fracture intra-articular at the metacarpophalangeal joint. 4. Repeat 0.045 inch Kirschner wire fixation of right thumb proximal    phalanx fracture.  SURGEON:  Katy Fitch. Sypher, Montez Hageman., M.D.  ASSISTANT:  Jonni Sanger, P.A.  ANESTHESIA:  General orotracheal.  SUPERVISING ANESTHESIOLOGIST:  Dr. Gelene Mink.  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  He was  seen on an urgent basis at that time and underwent bilateral hand and forearm fasciotomies as well as carpal tunnel releases.  He had severe comminuted fractures of his right thumb, index, long and ring fingers.  The thumb fracture was addressed on June 01, 2001 with shortening osteoplasty and box wire, Kirschner wire fixation.  In the early postoperative period, Steven [name] accidentally caught one of his Kirschner wires caught one of his Kirschner wires on his clothing and removed it.  This rendered his thumb fracture somewhat unstable.  His index metacarpal head fracture was severely comminuted and would require hemiarthroplasty.  We postponed surgery to contact the manufacturers of the carbon implants to determine whether or not hemiarthroplasty was sanctioned at this time.  The long finger metacarpal fracture was severely comminuted.  The intrinsics were decompressed on June 01, 2001 and arrangements were made for delayed primary reconstruction at this time.  The ring finger metacarpal fracture was initially minimally displaced but comminuted.  With observation, this went on to be rather significantly displaced and will require internal fixation at this time.  After informed consent, Steven [name] returns for reconstruction of his right hand at this time.  DESCRIPTION OF PROCEDURE:  Steven [name] was brought to the operating room and placed in supine position on the operating table.  Following induction of general orotracheal anesthesia, the right arm was prepped with Betadine soap and solution and sterilely draped.  A pneumatic tourniquet was applied to the proximal brachium.  Following exsanguination of the limb with an Esmarch bandage, the arterial tourniquet  was inflated to 240 mHg at the proximal brachium.  Prior to tourniquet inflation, 1 gm of Ancef was administered as an IV prophylactic antibiotic.  The procedure commenced with exposure of the ring finger metacarpal head  and neck through a longitudinal incision.  The extensor mechanism was split in the midline and the periosteum split over the neck of the metacarpal.  The severely comminuted of the head and neck was identified.  There were about four small fragments that rendered anatomic reduction and use of lag screw fixation impossible.  Therefore, the head was replaced in the shaft as anatomically as possible and repaired with a pair of figure-of-eight 25 gauge wires placed with tension band technique.  This led to a virtually anatomic reconstruction that was quite stable.  Attention was directed to the long finger metacarpal.  The severely comminuted shaft fracture was not amenable to plate fixation.  We considered possible use of an intramedullary cortical graft.  However, due to the basically intact periosteum, I elected to piece the fracture back together and hold the main fragment with a lag screw followed by use of a cerclage type Vicryl suture to suture the periosteum around the fracture.  This led to a satisfactory approximation of the major fracture fragment.  The length of the fracture was maintained by driving a 1.610 inch Kirschner wire across the small, ring and long finger metacarpal heads maintaining proper rotation and length.  This should prevent a rotational deformity as well as help maintain the proper length of the long finger ray.  Attention was then directed to the index metacarpal.  The comminuted fracture was exposed through a longitudinal incision which revealed complete destruction of the head.  There were approximately 15 fragments of the head within the empty capsule.  There was some comminution of the metacarpal neck. The proximal origins of the lateral ligament were disrupted both on the radial and ulnar aspect.  We removed all the comminuted fractures of both metacarpal heads, debrided the MP joint followed by maintenance of the periosteal structures at the site  of the collateral ligaments origins.  The proper broaches and guides were used for the carbon implant set to obtain an osteotomy at 27.5 degrees of the  metacarpal neck followed by broaching up to a size 40 implant.  The size 40 trial was placed and found to be proper.  A size 40 carbon implant was placed with no-touch technique after thorough lavage with triple antibiotic solution.  This was tamped into place followed by reconstruction of the periosteal sleeve of the metacarpal around the implant with mattress sutures of 2-0 Ethibond and 3-0 Ethibond.  The collateral ligaments were tensioned appropriately by repair over the neck of the metacarpal.  The alignment of the finger was satisfactory with proper function of the radial collateral ligament.  The extensor mechanism was then repaired with a running suture of 3-0 Ethibond.  The thumb fracture was reduced and secured with percutaneous 0.045 inch Kirschner wire using C-arm fluoroscope to control proper positioning.  The voluminous dressings on the left hand were changed with a new sugar-tong splint maintaining the forearm in supination, stabilizing the radius and ulnar fractures on that side.  The right hand was mobilized with a voluminous hand dressing with dorsal and palmar plaster sandwich splints maintaining the hand in safe position.  There were no apparent complications.  Steven [name] will be admitted for observation of his vital signs, intravenous antibiotics in the form of Ancef 1 gm IV q.8h.  and analgesics in the form of IV PCA morphine and p.o. Dilaudid.Dictated by:   [name], M.D.  Attending Physician:  Susa Day DD:  06/12/01 TD:  06/14/01 Job: [id number]

## 2010-07-26 NOTE — Discharge Summary (Signed)
[place]  Patient:    Steven [name], Steven [name]                       MRN: [id number] Adm. Date:  [id number] Disc. Date: [id number] Attending:  Farley Ly Dictator:   Joetta Manners, M.D. CC:         Farley Ly, M.D., Medicine             Joetta Manners, M.D.             Serena Colonel, M.D., ENT Surgery                           Discharge Summary  CONSULTANTS:  Dr. Pollyann Kennedy, ENT.  ADMISSION DIAGNOSIS:  Epistaxis.  DISCHARGE DIAGNOSIS AND PROBLEM LIST: 1. Persistent epistaxis. 2. Anemia secondary to #1. 3. Hypokalemia, which is resolved. 4. Alcohol abuse with alcoholic hepatitis. 5. Cocaine and marijuana abuse.  PROCEDURES:  Nasal endoscopy performed by Dr. Pollyann Kennedy on 05/09/99.  DISCHARGE MEDICATIONS: 1. Saline nasal spray 2 sprays to each nostril four times a day. 2. Multivitamin p.o. q. day.  DISCHARGE ACTIVITY:  No restrictions.  DISCHARGE DIET:  No restrictions.  WOUND CARE:  He is to continue to use his nasal spray for nasal moisture.  FOLLOWUP:  He is to follow up Friday, which would be 05/17/99 at 2 p.m. with myself in the [facility].  ADMISSION HISTORY:  Briefly, this is a 60 year old gentleman with no past medical history who presented to the emergency department on 05/07/99 at 5 a.m. with a 12-hour history of persistent nosebleeds.   He was treated with cauterization to the left nares medial wall and IV fluids.  His labs were not concerning at that  time and therefore, he was discharged; however, shortly thereafter, his bleeding resumed throughout the day, soaking his pillows and his sheets.  Patient states  that the bleeding stopped the night prior to admission; however, states that he has vomited blood x 3 and had a black, tarry stool x anywhere from 5-10.   States that he is feeling palpitations, is dizzy on standing, and is worried about the blood in his stool.   He states that he believes the nosebleeds aer secondary  to compounding material, which has gotten into his nose while standing.  PAST MEDICAL HISTORY:  Knee surgery in 1996.   No history of hypertension, diabetes, coronary artery disease.  MEDICATIONS:   None, except states he uses aspirin daily for aches and pain.  ALLERGIES:  None.  FAMILY HISTORY:  Significant for diabetes mellitus in his mother and father.  A  previous cerebrovascular accident in his mother.  SOCIAL HISTORY:  He lives in [place] with his girlfriend.  He is a Occupational psychologist.  Does drink a significant amount of alcohol, which is hard to quantify.  Does use illicit substances, including THC and a history of cocaine use approximately one month ago.  REVIEW OF SYSTEMS ON ADMISSION:  Negative for heartburn, epigastric pain, prior  rectal bleeding, or history of alcohol withdrawal.  PHYSICAL EXAMINATION:   On admission, his temperature was 96.7, pulse 166, blood pressure 114/92, respiratory rate 20, O2 sat 100% on room air.  Physical exam of note, his left nares had dried blood, but no active bleeding.   Cardiovascular:  He was tachycardic with no murmurs, rubs, or gallops and regular rate and rhythm.  Chest:  Clear to auscultation bilaterally.  Abdomen:  Soft and nontender, nondistended, positive bowel sounds, no rebound or guarding.  Rectal:  Grossly heme-positive without prostate enlargement.  EKG on admission showed sinus tachycardia, some mild ST segment changes. Hemoglobin on admission was 11.2.  Electrolytes on admission included a sodium f 135, potassium 2.9, chloride 100, bicarb 21, BUN 27, creatinine 0.9, glucose 165. Other labs of note, SGOT 63, SGPT 43.  A urine drug screen was obtained, which as positive for cocaine and marijuana.   Urinalysis was significant for 100 glucose, small leukocyte esterase, few bacteria.  HOSPITAL COURSE: 1. Epistaxis/anemia.  After admission, patients EKG was rechecked and the ST segment changes were  resolved.   He suffered no chest pain, further palpitations, dizziness, or shortness of breath.   He did well during his admission with no further episodes of epistaxis.   ENT consult was obtained on 05/09/99 and patient  underwent nasal endoscopy, which was remarkable for a large caliber vessel visible just below the mucosa of the anterior septum on the left side.  There were no further bleeding sites identified and the vessel was cauterized, using a topical cocaine anesthetic with silver nitrate without complications.   On 05/09/99, patients H&H had dropped to 8.6 and 26.0, therefore, he was transfused two units f packed red blood cells.  By the time of discharge, his H&H had rebounded to 9.3 and 27.0 and there was no further evidence of blood loss.  As patients nosebleeds were stable and ENT was comfortable.  Patient was discharged on 05/10/99 on a saline nasal spray to be used q.i.d. to ensure nasal mucosa hydration.  2. Polysubstance abuse.  Patient was placed on Librium 50 mg p.o. q. day for DT  prophylaxis, as well as a multivitamin.  Patient continued to do well without evidence of DTs.  Social work consulted patient to offer substance abuse counseling; however, he refused all treatment options.  3. Hypokalemia.   This resolved by the time of discharge with oral potassium replacement.  His discharge potassium was 3.6.  ISSUES TO FOLLOW UP ON:  At his follow-up appointment, we will need to check a BG on the patient as he had glycosuria and a family history of diabetes.  We will lso need to recheck his H&H to make sure that it remains stable, as well as a serum  potassium.     DD:  05/10/99 TD:  05/12/99 Job: [id number]

## 2010-07-26 NOTE — Discharge Summary (Signed)
[place]  Patient:    Steven [name], Steven [name] Visit Number: [id number] MRN: [id number]          Service Type: MED Location: 5000 [id number] Attending Physician:  Alfonso Ramus Dictated by:   Harrold Donath, M.D. Admit Date:  06/24/2001 Discharge Date: 07/02/2001                             Discharge Summary  DISCHARGE DIAGNOSES: 1. Mental status changes. 2. Hypertension. 3. Polysubstance abuse. 4. Thrombocytopenia.  DISCHARGE MEDICATIONS: 1. Lopressor 25 mg p.o. q.d. 2. Hydrochlorothiazide 25 mg p.o. q.d.  HISTORY OF PRESENT ILLNESS:  The patient is a 60 year old, white male with history of recent bilateral arm fractures secondary to trauma who three weeks prior to admission, was found unresponsive at home.  The length of unresponsiveness is unknown.  He had a history of alcohol, marijuana and cocaine abuse.  When found by EMS, he had a normal CBG, but was noted to be agitated and hot to touch with sluggish pupils.  Narcan was given without response.  HOSPITAL COURSE:  #1 - MENTAL STATUS CHANGES:  The patient was noted to be combative and unresponsive on admission.  He would turn his head in response to voice, but would not follow commands.  He was moving lower extremities, but his upper extremities were in braces.  He was also noted to have clonus with multiple beats on the left and a few on the right.  His urine drug screen was positive for cocaine and marijuana and his head CT showed multiple old lacunar infarcts, but no acute disease.  The patient was admitted to the ICU and was ruled out by enzymes for an acute myocardial infarction.  Initially, this was thought this could be secondary to polysubstance abuse versus a cerebrovascular accident.  The patient was also noted to be febrile with a T-max of 102.6.  The patient was tapped and started on Tequin and Rocephin IV. He was then started on acyclovir when his cultures came back  negative.  After the HSV, TCR came back negative and acyclovir was discontinued.  After a few days, the patient had improved mental status.  He became more alert and oriented.  His antibiotics were discontinued after all cultures came back negative.  The patient began to take p.o. without aspiration and was started on a regular diet and physical therapy was consulted for any assistance.  #2 - HYPERTENSION:  Initially, the patient was noted to be hypertensive with a blood pressure of 198/109.  He was started on IV labetalol which was then switched to p.o. medications after the patient was taking p.o.  His blood pressures trended down into systolics of 150-170 with diastolic of 80.  #3 - THROMBOCYTOPENIA:  The patient was noted to have a relatively normal platelet count on admission of 150.  This then trended downward throughout his stay to a low of 82.  This was thought to be secondary to alcohol use.  His DIC panel was negative.  DISPOSITION:  The patient was going to have a course of rehabilitation with physical therapy since it was initially thought the family could not care for the patient.  The family then decided that they could supervise the patient 24 hours a day and the patient was discharged home with them after his mental status cleared.  CONDITION ON DISCHARGE:  Stable.  SPECIAL INSTRUCTIONS:  The patients family was  told of his medical regimen and also to call the [facility] to make an appointment with Dr. Rodman Pickle. Dictated by:   Harrold Donath, M.D. Attending Physician:  Alfonso Ramus DD:  07/23/01 TD:  07/27/01 Job: [phone]

## 2010-07-26 NOTE — Discharge Summary (Signed)
NAME:  Steven [name], Steven [name]                          ACCOUNT NO.:  [id number]   MEDICAL RECORD NO.:  [id number]                   PATIENT TYPE:  IPS   LOCATION:  4038                                 FACILITY:  [facility]   PHYSICIAN:  Rande Brunt. Thomasena Edis, M.D.             DATE OF BIRTH:  June 18, 1950   DATE OF ADMISSION:  10/04/2001  DATE OF DISCHARGE:  10/13/2001                                 DISCHARGE SUMMARY   DISCHARGE DIAGNOSES:  1. 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  2. Hypertension.  3. History of alcohol abuse.  4. 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   HISTORY OF PRESENT ILLNESS:  The patient is a 59 year old, right-handed male  with a history of hypertension and polysubstance abuse admitted to [facility] on July 18 with a four-day history of left lower extremity  weakness and left lower extremity pain.  MRI and MRA done showed pronounced  active acute on chronic small vessel disease with acute infarction in each  hemisphere worrisome for cardiac or aortic source, no stenosis.  Carotid  duplex done showed no internal carotid artery stenosis.  Echocardiogram  showed ejection fraction of 55 to 65% and  to rule out left atrial thrombus.  Therefore, followup transesophageal echocardiogram was done showing small  density compatible with clot in LAA, possible thrombus at the base of LAA.  [facility] has been following and felt the patient to be a high-risk for  anticoagulation secondary to poor insight and history of drug and alcohol  abuse.  The patient was placed on aspirin for cerebrovascular accident  prophylaxis.  Physical therapy was initiated and the patient was noted to be  minimal assistance for transfers, minimal assistance for ambulating 25 feet  with hemiwalker, noted to have problems advancing left lower extremity.   PAST MEDICAL HISTORY:  Significant for hypertension, history of epistaxis,  history of bilateral hand crush injuries with compartment syndrome right  upper extremity, history of cocaine, alcohol and marijuana abuse, history of  alcoholic hepatitis.   ALLERGIES:  To Ancef.   SOCIAL HISTORY:  The patient lives with his girlfriend in a one-level home  with three steps.  He was independent prior to admission.  He denies any  drug or tobacco use.  Reports using a six-pack on the weekends.   HOSPITAL COURSE:  The patient was admitted to rehabilitation on October 04, 2001, for inpatient therapies to consist of physical therapy and  occupational therapy daily.  Past admission Plavix was added to aspirin for  cerebrovascular accident prophylaxis.  At the time of admission the patient  was oriented to self and place, unable to recall situation.  Some impulsivity as well as problems with recall noted.  He was unable to  follow two-step commands with _________  and accuracy.  He was noted to have  mild left hemiparesis.   DICTATION ENDS HERE     Dalina Samara DICTATOR  Daniel L. Thomasena Edis, M.D.    DD/MEDQ  D:  10/13/2001  T:  10/18/2001  Job:  [id number]   cc:   Ileana Roup, M.D.

## 2010-07-26 NOTE — Op Note (Signed)
[place]  Patient:    KOSTON, HENNES Visit Number: [id number] MRN: [id number]          Service Type: SUR Location: 1800 [id number] Attending Physician:  Lorre Nick Dictated by:   Katy Fitch. Naaman Plummer., M.D. Proc. Date: 06/01/01 Admit Date:  06/01/2001                             Operative Report  PREOPERATIVE DIAGNOSIS:  Complex crushing lacerations, right and left upper extremities with the following injuries affecting the right upper extremity: near avulsion open fracture of proximal phalangeal diaphysis and proximal metaphysis with circumferential skin laceration; however, there was preservation of the radial and ulnar proper digital nerves and preservation of the ulnar proper digital artery and a few dorsal veins with preservation of an intact vascular status to the right thumb.  Avulsion of the extensor pollicis longus at the interphalangeal joint of the thumb.  The index finger sustained a closed severely comminuted head-splitting fracture of the metacarpal at the metacarpophalangeal joint with loss of length with angular and rotational deformity.  The long finger sustained an extraordinarily comminuted fracture of the entire shaft including the distal metaphysis, diaphysis and proximal metaphysis with impending compartment syndrome of the intrinsic muscles of the right hand and carpal tunnel syndrome.  Left upper extremity injuries were noted to be the following: Severely comminuted fractures of the distal diaphysis and metaphysis of the radius and ulna with shortening and angular deformity with forearm volar compartment syndrome and carpal tunnel syndrome.  There was a minor avulsion type laceration to the dorsal aspect of the left thumb interphalangeal joint.  This was explored to be certain that there was no sign of an extensor pollicis longus avulsion.  POSTOPERATIVE DIAGNOSIS:  OPERATION PERFORMED: Right upper extremity  - 1. Shortening osteoplasty, right thumb proximal phalanx followed by open    reduction internal fixation with interosseous wire technique utilizing two    intramedullary 0.045 inch Kirschner wire and a 26 gauge box wire. 2. Reconstruction of extensor pollicis longus. 3. Closure of complex near circumferential laceration. 4. Index finger incidental fasciotomy. 5. Attempted open reduction internal fixation of long finger metacarpal    comminuted fracture ultimately abandoned due to more than 10 fracture    fragments and an inability to create a proper fracture construct for stable    internal fixation utilizing either titanium or ASIF plate set.  This will    ultimately require some type of cortical bone grafting and either    interosseous wire technique and/or some type of external fixation to obtain    and maintain reduction of this fracture.  Left upper extremity - 1. Volar forearm fasciotomy. 2. Left carpal tunnel release and evacuation of clot. 3. Open reduction internal fixation of a severely comminuted radius fracture    utilizing a long DVR volar plate and supplemental open reduction internal    fixation of the shaft fracture with an ASIF neutralization plate placed    radially. 4. Application of an MIG graft substrate.  SURGEON:  Katy Fitch. Sypher, Montez Hageman., M.D.  ASSISTANT:  Marveen Reeks Dasnoit, P.A.C.  ANESTHESIA:  General orotracheal.  SUPERVISING ANESTHESIOLOGIST:  Dr. Ivin Booty.  FLUID REPLACEMENT:  See anesthesia sheets.  TOURNIQUET TIME:  Right upper extremity 1 hour 53 minutes at 240 mHg, left upper extremity 1 hour 24 minutes 240 mHg.  COMPLICATIONS:  None noted.  This is the first of a  staged procedure.  We will need to return for reconstruction of the right index metacarpophalangeal joint and open reduction internal fixation of the right long finger metacarpal, utilizing either cortical graft or some type of complex interosseous wire armature construct.  INDICATIONS  FOR PROCEDURE:  The patient is a 60 year old gentleman who is employed at [employer].  Early this morning, he accidentally caught his right and left upper extremities in a grinding mechanism sustaining severe crushing of his right hand, left thumb and left distal forearm.  He was transported by EMS to the [facility] Emergency Department where he was evaluated by Dr. Freida Busman, attending emergency room physician. Dr. Freida Busman identified impending compartment syndromes of the right hand and left forearm and called for immediate upper extremity orthopedic consult. X-rays revealed the aforementioned comminuted fractures and clinical examination documented the complex soft tissue injuries.  Arrangements were made for Mr. Schramm to be transferred to the operating room as a class 2 emergency.  After informed consent at which time he was advised we would likely need to perform a staged series of procedures following fasciotomy, irrigation and debridement of his wounds and internal fixation as complete as possible at this initial surgical setting.  Due to the severe comminuted nature of his metacarpal fracture on the right as well as his radius and ulnar fracture, plate fixation may be inadequate to obtain a stable construct.  We may need to rely on external fixation and/or some type of allograft/autogenous graft for late reconstruction.  Given the impending compartment syndrome predicament, we brought him to surgery on an urgent basis for decompression, irrigation and debridement and initial reconstruction.  DESCRIPTION OF PROCEDURE:  Mauricio Dahlen was brought to the operating room and placed in supine position on the operating table.  Following induction of general orotracheal anesthesia, pneumatic tourniquets were applied to proximal right and left brachii.  The right arm was prepped with Betadine soap and solution and sterilely draped as was the left arm prepped with  Betadine soap and solution and sterilely draped.  Following exsanguination of the right arm with an Esmarch bandage, the  arterial tourniquet on the proximal brachium was inflated to 240 mHg.  The procedure commenced with fasciotomy incisions on the dorsal aspect of the hand.  An extended incision was fashioned from the long finger metacarpal head to the base of the metacarpal.  The subcutaneous region was dissected and the intrinsic fascia released for the index and long fingers.  The ring and small fascia was not particularly tense due to the comminuted nature of the fractures.  In my judgment, compartment syndrome was unlikely in this compartment.  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  Given this predicament, I anticipate that Mr. Spurling would benefit from an implant arthroplasty reconstruction of his index metacarpophalangeal joint at a later date.  The long finger metacarpal fracture was exposed, taking care to preserve as much of the soft tissue envelope as possible on the fracture fragment.  There were at least eight large and more than 10 total fragments of this metacarpal.  We initially attempted to create a staggered plate fixation construct utilizing the ASIF minifragment set.  However, this was technically not obtaining adequate purchase to obtain stable fixation.  Therefore, we attempted use of Leibinger titanium plate set which allows more enveloping reconstruction of the fracture fragments; however, due to  extraordinary comminution as well as the degree of swelling of the hand, we simply could not achieve a stable construct.  Therefore, I elected to halt our efforts today and return in a second stage with either an allograft or autogenous graft to reconstruct the metacarpal.  Given the compartment predicament on  the left side, I simply could not spend hours reconstructing the fracture at this time. The long finger wound was irrigated and closed with a running intradermal Prolene suture.  Attention was directed to the thumb where the wound was irrigated and debrided.  The periosteum was peeled back allowing the severely comminuted fracture to be studied.  Once again. There were multiple small fragments which did not allow interdigitating the fracture.  Therefore an oscillating saw was used to remove approximately 3 mm of bone from the distal fragment and 2 mm of bone from the proximal fragment, performing a shortening osteoplasty of the thumb proximal phalanx allowing reconstruction.  The fracture was then repaired with a box wire 26 gauge stainless followed by use of two 0.045 inch Kirschner wires intramedullary with retrograde technique.  An anatomic albeit shortened proximal phalanx ORIF was achieved. The periosteum was then repaired with interrupted sutures of 4-0 Vicryl, followed by repair of the extensor pollicis longus with 4-0 Mersilene.  The K-wires were trimmed and bent.  The skin wound was then repaired with multiple interrupted sutures of 5-0 nylon.  The carpal tunnel was released through a standard long carpal tunnel palmar incision with release of the volar forearm fascia from the distal wrist flexion crease region subcutaneously with rhitidectomy scissors.  The carpal tunnel was extraordinarily tense with some hematoma noted.  This was irrigated and evacuated.  The palmar wound was then repaired with intradermal 3-0 Prolene suture.  The thenar muscles were moderately swollen; however, there did not appear to be signs of compartment syndrome affecting the adductor pollicis muscle.  The tourniquet was released with good capillary refill in the fingers and immediate capillary refill in the thumb.  The wounds were dressed with Lyda Perone and a voluminous gauze dressing with 4 x 4s and  Kerlix followed by application of a safe position plaster splint.  Attention was then directed to the left upper extremity.  This was exsanguinated with an Esmarch bandage and an arterial tourniquet about the proximal brachium inflated to 240 mHg.  An extended volar incision from the level of the midpalm releasing the carpal tunnel and volar forearm fascia was accomplished.  This was taken along the plane of the flexor carpi radialis in the forearm and traditional carpal tunnel incision was completed in the palm. The transverse carpal ligament was isolated, taking care to protect the superficial palmar arch followed by release of the volar carpal ligament and the transverse carpal ligament.  There was hematoma in the carpal canal and the synovium was under considerable tension due to edema.  The volar forearm fascia was released to nearly the level of the antecubital fossa subcutaneously followed by radial artery identification and ulnar retraction of the flexor pollicis longus.  The pronator quadratus was carefully elevated off the comminuted fracture as was the flexor pollicis longus.  The fracture was reconstructed with a neutralization plate along the radial shaft utilizing an ASIF semitubular plate and 2.7 mm cortical screws.  The radial fracture fragments were severely comminuted; however, a long DVR plate was useful for recovering full length of the radius.  It was challenging to recovery upper volar tilt; however, a virtually anatomic reduction was  achieved.  The comminuted fracture fragments were placed in their anatomic position and MIG substrate was injected after routine preparation to consolidate the comminuted fracture fragments.  The DVR plate was completed with distal fixation pegs utilizing threaded pegs and C-arm fluoroscopy revealed a very satisfactory anatomic reduction of the radius.  Given the extraordinary comminution of the ulna and concern about  devitalizing the ulna, I elected to treat this closed utilizing the radius as a traction device.  The wound was irrigated with sterile saline followed by triple antibiotic solution.  The pronator quadratus was repaired over the DVR plate with interrupted sutures of 4-0 Vicryl followed by repair of the skin with subdermal sutures of 4-0 Vicryl and intradermal 3-0 Prolene.  The tourniquet  was released with immediate capillary refill of the fingers and thumb followed by application of voluminous gauze dressing and sugar-tong splint maintaining the wrist in neutral position and full supination of the forearm.  There were no apparent complications.  Mr. Marchena was awakened from anesthesia and transferred to recovery with stable vital signs.  He will be admitted to the hospital for several days of observation.  At some point, we will need to schedule elective reconstruction of the right metacarpal predicaments. Dictated by:   [name], M.D. Attending Physician:  Lorre Nick DD:  06/01/01 TD:  06/02/01 Job: [id number]

## 2010-07-26 NOTE — Op Note (Signed)
[facility]  Patient:    Steven [name], Steven [name] Visit Number: [id number] MRN: [id number]          Service Type: MED Location: [phone] Attending Physician:  Farley Ly Dictated by:   Netta Cedars, M.D. Proc. Date: 09/30/01 Admit Date:  09/24/2001   CC:         Farley Ly, M.D.   Operative Report  REFERRING PHYSICIAN:  Farley Ly, M.D.  PROCEDURE:  Transesophageal echocardiography.  INDICATION:  History of strokes.  COMPLICATIONS:  None immediate.  SEDATION: Versed 3 mg IV and fentanyl 15 mg IV.  FINDINGS:  1. Aortic valve was thin and pliable with good excursion.  2. No aortic insufficiency noted.  3. Mitral valve is also reasonably thin with normal excursion.  4. A mild central jet of mitral regurgitation is noted.  5. Tricuspid valve is thin and pliable with normal excursion.  6. Mild tricuspid regurgitation.  7. Left ventricular chamber size and wall thicknesses appear normal.  8. Normal left ventricular systolic function.  9. No regional wall motion abnormality is noted. 10. Right ventricle also appears normal in size with normal right ventricular     systolic function. 11. Left atrium appears subjectively normal in size. 12. The left atrial appendage is small and is notable for a small (3-4 mm)     mobile density attached just to the mouth of the left atrial appendage     consistent with thrombus.  Also, cannot exclude the possibility of     thrombus at the base of the left atrial appendage which is not as well as     visualized. 13. Left atrial appendage outflow velocities are approximately 40 cm per     second. 14. Bubble study is negative. 15. Aorta has mild localized grade 1 plaque in the distending aorta just     beyond the aortic arch (otherwise the aorta is unremarkable). Dictated by:   Netta Cedars, M.D. Attending Physician:  Farley Ly DD:  09/30/01 TD:  10/04/01 Job:  [id number]

## 2010-07-26 NOTE — Discharge Summary (Signed)
[place]  Patient:    Steven [name], Steven [name] Visit Number: [id number] MRN: [id number]          Service Type: MED Location: [phone] Attending Physician:  Alfonso Ramus Dictated by:   Jonni Sanger, P.A. Admit Date:  06/24/2001 Discharge Date: 07/02/2001                             Discharge Summary  ADMISSION DIAGNOSIS:  Grinding wheel crush injuries to the right hand and left distal forearm with impending compartment syndrome of the right hand intrinsics.  DISCHARGE DIAGNOSIS: Grinding wheel crush injuries to the right hand and left distal forearm with impending compartment syndrome of the right hand intrinsics.  OPERATIONS PERFORMED: 1. Right hand    a. Thumb shortening of the proximal phalanx/ORIF with K-wire and  interosseous 26 gauge wire.    b. Index fascial release.    c. Attempted ORIF of comminuted fracture of the long metacarpal.    d. Right carpal tunnel release. 2. Left hand.    a. Left hand fasciotomy.    b. Left carpal tunnel release.    c. An ORIF of complex radial fracture with long DVR plate and ASIF       neutralization plate with MIG graft.  SURGEON: Katy Fitch. Naaman Plummer., M.D.  DATE OF SURGERY:  June 01, 2001.  CONSULTATIONS:  Pharmacy Department.  HISTORY OF PRESENT ILLNESS: The patient is a 60 year old right hand dominant gentleman who caught his bilateral upper extremities in a grinding wheel at work, which he works at the [employer]. He was transported by his brother to the [facility] emergency room. X-rays there revealed severely comminuted fractures of the thumb, middle finger metacarpal, and index finger metacarpal head in addition to severely comminuted fracture of the left distal radius and ulna. He was seen in an orthopedics/hand consult by Dr. Josephine Igo. It was felt that he would need to be taken to the operating room for extensive reconstructive surgeries of  both upper extremities.  LABORATORY DATA: Preoperative labs revealed a white blood cell count of 5.5, hemoglobin of 14.0 with hematocrit of 40.4, platelets 106,000.  Chemistry profile revealed a slightly low sodium at 133, CO2 was slightly low at 18, glucose was elevated at 125.  Preoperative EKG revealed normal sinus rhythm with sinus arrhythmia.  HOSPITAL COURSE: The patient was seen and evaluated in the emergency room at [facility]. It was felt that he would need to be taken to the operating room immediately for reconstructive surgery with fasciotomies for impending compartment syndrome. The procedure risks, benefits and complications were discussed with the patient at length and he was in agreement with this plan. He was taken to the operating room where he underwent the above listed surgeries under general anesthesia. He tolerated these procedures well and was taken to the recovery room in stable condition.  Postoperatively he was placed on vancomycin per the pharmacy protocol. His pain was well controlled with morphine by PCA and p.o. Percocet. The first day postoperatively he had a slightly elevated temperature of 101.2. His pain was well controlled. We had a lengthy discussion with the patient concerning the need for second and possible third procedures for further reconstruction of his hand. At this time we allowed the patient to be out of bed and asked the nurses to help the patient monitor his PCA since he was having difficulty  with this.  The second day postop on June 03, 2001, he was afebrile. Blood pressures remained elevated in the 150 to 100 range.  Oxygen saturations were normal. He was voiding without difficulty and had a bowel movement. He was tolerating the pain fairly well on his PCA morphine. He had a good appetite. On examination dressings were changed on both upper extremities. There was no sign of wound necrosis.  Neurovascularly he was intact.  He  had 1+ swelling of both hands. Pin tracts were OK. At this time new dressings were applied to bilateral hands. We continued his IV PCA and encouraged the patient to use p.o. Percocet and the patient was discharged the following day. We continued his bedside incentive spirometry and encouraged him to be out of bed walking.  On June 04, 2001, he remained afebrile. Blood pressures were under better control. He continued with good appetite. He was voiding well. His pain was under good control with p.o. medications. Neurovascularly he was intact. He had increasing range of motion to the left digits. Swelling was decreased and he had diffuse ecchymosis of both hands. The plan at this time, it was felt that the patient was stable and ready for discharge to home.  DISCHARGE MEDICATIONS: He was given prescriptions for Dilaudid 2 mg #30, 1 p.o. q.4-6h. p.r.n. pain and Motrin 600 mg 1 p.o. q.6h. with food and Keflex 500 mg 1 p.o. q.8h.  DISCHARGE INSTRUCTIONS:  Wound care, he will keep both dressings elevated and dry. Diet is as tolerated.  FOLLOW UP:  Appointments, he will return to see Dr. Teressa Senter in our office on Monday, June 07, 2001, or sooner should he have any problems.  CONDITION ON DISCHARGE:  Stable and improved. Dictated by:   Jonni Sanger, P.A. Attending Physician:  Alfonso Ramus DD:  07/15/01 TD:  07/20/98 Job: [id number]

## 2010-12-10 LAB — DIFFERENTIAL
Basophils Absolute: 0
Basophils Relative: 1
Eosinophils Absolute: 0.1
Eosinophils Relative: 1
Monocytes Absolute: 0.3
Neutro Abs: 3

## 2010-12-10 LAB — POCT I-STAT, CHEM 8
BUN: 12
Creatinine, Ser: 1
Glucose, Bld: 93
Hemoglobin: 15.6
Potassium: 4.2

## 2010-12-10 LAB — POCT URINALYSIS DIP (DEVICE)
Hgb urine dipstick: NEGATIVE
Protein, ur: NEGATIVE
Specific Gravity, Urine: 1.015
Urobilinogen, UA: 0.2

## 2010-12-10 LAB — CBC
Hemoglobin: 15
MCHC: 33.5
MCV: 89.4
RDW: 12.6

## 2010-12-17 ENCOUNTER — Emergency Department (HOSPITAL_COMMUNITY)
Admission: EM | Admit: 2010-12-17 | Discharge: 2010-12-17 | Disposition: A | Discharge status: Home / Self Care | Payer: Medicaid Other | Attending: Emergency Medicine | Admitting: Emergency Medicine

## 2010-12-17 DIAGNOSIS — Z8673 Personal history of transient ischemic attack (TIA), and cerebral infarction without residual deficits: Secondary | ICD-10-CM | POA: Insufficient documentation

## 2010-12-17 DIAGNOSIS — R1110 Vomiting, unspecified: Secondary | ICD-10-CM | POA: Insufficient documentation

## 2010-12-17 DIAGNOSIS — Z79899 Other long term (current) drug therapy: Secondary | ICD-10-CM | POA: Insufficient documentation

## 2010-12-17 DIAGNOSIS — K219 Gastro-esophageal reflux disease without esophagitis: Secondary | ICD-10-CM | POA: Insufficient documentation

## 2010-12-17 DIAGNOSIS — G8929 Other chronic pain: Secondary | ICD-10-CM | POA: Insufficient documentation

## 2010-12-17 DIAGNOSIS — K2970 Gastritis, unspecified, without bleeding: Secondary | ICD-10-CM | POA: Insufficient documentation

## 2011-06-02 ENCOUNTER — Emergency Department (HOSPITAL_COMMUNITY): Payer: Medicaid Other

## 2011-06-02 ENCOUNTER — Encounter (HOSPITAL_COMMUNITY): Payer: Self-pay | Admitting: *Deleted

## 2011-06-02 ENCOUNTER — Emergency Department (HOSPITAL_COMMUNITY)
Admission: EM | Admit: 2011-06-02 | Discharge: 2011-06-02 | Disposition: A | Discharge status: Skilled Nursing Facility | Payer: Medicaid Other | Attending: Emergency Medicine | Admitting: Emergency Medicine

## 2011-06-02 DIAGNOSIS — R112 Nausea with vomiting, unspecified: Secondary | ICD-10-CM | POA: Insufficient documentation

## 2011-06-02 DIAGNOSIS — R071 Chest pain on breathing: Secondary | ICD-10-CM | POA: Insufficient documentation

## 2011-06-02 DIAGNOSIS — Z8679 Personal history of other diseases of the circulatory system: Secondary | ICD-10-CM | POA: Insufficient documentation

## 2011-06-02 DIAGNOSIS — K219 Gastro-esophageal reflux disease without esophagitis: Secondary | ICD-10-CM | POA: Insufficient documentation

## 2011-06-02 DIAGNOSIS — R0789 Other chest pain: Secondary | ICD-10-CM

## 2011-06-02 DIAGNOSIS — G8929 Other chronic pain: Secondary | ICD-10-CM | POA: Insufficient documentation

## 2011-06-02 DIAGNOSIS — F411 Generalized anxiety disorder: Secondary | ICD-10-CM | POA: Insufficient documentation

## 2011-06-02 DIAGNOSIS — R197 Diarrhea, unspecified: Secondary | ICD-10-CM | POA: Insufficient documentation

## 2011-06-02 HISTORY — DX: Other chronic pain: G89.29

## 2011-06-02 HISTORY — DX: Unspecified fracture of shaft of humerus, left arm, initial encounter for closed fracture: S42.302A

## 2011-06-02 HISTORY — DX: Cerebral infarction, unspecified: I63.9

## 2011-06-02 HISTORY — DX: Other neuromuscular dysfunction of bladder: N31.8

## 2011-06-02 HISTORY — DX: Unspecified inflammatory spondylopathy, lumbar region: M46.96

## 2011-06-02 HISTORY — DX: Anxiety disorder, unspecified: F41.9

## 2011-06-02 HISTORY — DX: Gastro-esophageal reflux disease without esophagitis: K21.9

## 2011-06-02 MED ORDER — OXYCODONE-ACETAMINOPHEN 5-325 MG PO TABS
1.0000 | ORAL_TABLET | Freq: Once | ORAL | Status: AC
  Administered 2011-06-02: 1 via ORAL
  Filled 2011-06-02: qty 1

## 2011-06-02 MED ORDER — LOPERAMIDE HCL 2 MG PO CAPS: 2.0000 mg | ORAL_CAPSULE | Freq: Four times a day (QID) | ORAL | Status: AC | PRN

## 2011-06-02 MED ORDER — PROMETHAZINE HCL 25 MG PO TABS: 25.0000 mg | ORAL_TABLET | Freq: Four times a day (QID) | ORAL | Status: DC | PRN

## 2011-06-02 MED ORDER — OXYCODONE-ACETAMINOPHEN 5-325 MG PO TABS: 1.0000 | ORAL_TABLET | ORAL | Status: AC | PRN

## 2011-06-02 NOTE — ED Provider Notes (Signed)
History     CSN: [id number]  Arrival date & time 06/02/11  1040   First MD Initiated Contact with Patient 06/02/11 1157      Chief Complaint  Patient presents with  . Nausea  . Vomiting  . Diarrhea    (Consider location/radiation/quality/duration/timing/severity/associated sxs/prior treatment) Patient is a 61 y.o. male presenting with diarrhea. The history is provided by the patient and the nursing home.  Diarrhea The primary symptoms include nausea, vomiting and diarrhea. Primary symptoms do not include fever, abdominal pain or rash.  The illness does not include chills.   the patient is a 61 year old, male, with a history of stroke, who lives in a nursing home.  He was sent over here, but the nursing home report of vomiting and diarrhea.  He states that he is still nauseated.  He also says that he fell a few days ago onto his left chest wall.  He complains of left-sided chest wall pain.  He denies pain anywhere else.  He denies fever, chills, or respiratory symptoms.  He does not want anything for the pain   Past Medical History  Diagnosis Date  . Lumbar spondylitis   . Anxiety   . Urinary incontinence   . GERD (gastroesophageal reflux disease)   . Stroke   . Chronic pain   . Hypertonicity of bladder   . Fracture of left humerus     History reviewed. No pertinent past surgical history.  History reviewed. No pertinent family history.  History  Substance Use Topics  . Smoking status: Never Smoker   . Smokeless tobacco: Not on file  . Alcohol Use: Yes     6pack a week      Review of Systems  Constitutional: Negative for fever and chills.  Respiratory: Negative for cough and shortness of breath.   Cardiovascular: Positive for chest pain.       Left lower anterior chest wall pain  Gastrointestinal: Positive for nausea, vomiting and diarrhea. Negative for abdominal pain.  Skin: Negative for rash.  Neurological: Negative for headaches.  Psychiatric/Behavioral:  Negative for confusion.  All other systems reviewed and are negative.    Allergies  Penicillins  Home Medications   Current Outpatient Rx  Name Route Sig Dispense Refill  . ACETAMINOPHEN 325 MG PO TABS Oral Take 650 mg by mouth every 6 (six) hours as needed. For pain    . VITAMIN D 1000 UNITS PO TABS Oral Take 1,000 Units by mouth daily.    Marland Kitchen CLOPIDOGREL BISULFATE 75 MG PO TABS Oral Take 75 mg by mouth daily.    . ADULT MULTIVITAMIN W/MINERALS CH Oral Take 1 tablet by mouth daily.    Marland Kitchen OMEPRAZOLE 20 MG PO CPDR Oral Take 40 mg by mouth daily.    Marland Kitchen SIMVASTATIN 20 MG PO TABS Oral Take 20 mg by mouth at bedtime.    . TOLTERODINE TARTRATE ER 4 MG PO CP24 Oral Take 4 mg by mouth daily.      BP 109/79  Pulse 100  Temp(Src) 98.3 F (36.8 C) (Oral)  Resp 16  SpO2 94%  Physical Exam  Vitals reviewed. Constitutional: He is oriented to person, place, and time. He appears well-developed and well-nourished.  HENT:  Head: Normocephalic and atraumatic.  Eyes: Conjunctivae are normal. Pupils are equal, round, and reactive to light.  Neck: Normal range of motion. Neck supple.  Cardiovascular: Normal rate.   No murmur heard. Pulmonary/Chest: Effort normal and breath sounds normal. He exhibits tenderness.  Left lower anterior chest wall tenderness, with no ecchymoses.  No crepitance  Abdominal: Soft. Bowel sounds are normal. He exhibits no distension. There is no tenderness.  Neurological: He is alert and oriented to person, place, and time.  Skin: Skin is warm and dry.  Psychiatric: He has a normal mood and affect. Thought content normal.    ED Course  Procedures (including critical care time)  Labs Reviewed - No data to display Dg Chest [facility] 1 View  06/02/2011  *RADIOLOGY REPORT*  Clinical Data: Left-sided chest pain.  Fell.  PORTABLE CHEST - 1 VIEW  Comparison: None  Findings: The cardiac silhouette, mediastinal and hilar contours are within normal limits.  The lungs are clear.  Low lung volumes with minimal basilar atelectasis.  No pleural effusion or pneumothorax.  The bony thorax is intact.  No definite acute left- sided rib fractures.  Remote appearing post-traumatic changes involving the left humeral neck.  IMPRESSION: No acute cardiopulmonary findings.  Original Report Authenticated By: P. Loralie Champagne, M.D.     No diagnosis found.    MDM  Chest wall pain        Cheri Guppy, MD 06/02/11 1311

## 2011-06-02 NOTE — ED Notes (Signed)
To [facility] via [facility]

## 2011-06-02 NOTE — ED Notes (Signed)
ptar reports pt is from [facility]. Alert and oriented x4, semi ambulatory, hx of CVA. C/o of n/v/d x1 week. Denies pain and sob. Pt reports he "fell off the wheelchair van last week, but did not get hurt or come to hospital".

## 2011-06-02 NOTE — Discharge Instructions (Signed)
Use Phenergan for nausea and vomiting, Imodium for diarrhea, and ibuprofen for pain.  Use Percocet for more severe pain.  Followup with your Dr. if your symptoms.  Last more than 2-3 days.  Return for worse or uncontrolled symptoms

## 2011-06-02 NOTE — ED Notes (Signed)
Bed:WHALA<BR> Expected date:<BR> Expected time:10:27 AM<BR> Means of arrival:Ambulance<BR> Comments:<BR> [facility] 22 -- N/V/D

## 2011-06-11 ENCOUNTER — Encounter (HOSPITAL_COMMUNITY): Payer: Self-pay | Admitting: Emergency Medicine

## 2011-06-11 ENCOUNTER — Emergency Department (HOSPITAL_COMMUNITY)
Admission: EM | Admit: 2011-06-11 | Discharge: 2011-06-12 | Disposition: A | Discharge status: Skilled Nursing Facility | Payer: Medicaid Other | Attending: Emergency Medicine | Admitting: Emergency Medicine

## 2011-06-11 DIAGNOSIS — G8929 Other chronic pain: Secondary | ICD-10-CM | POA: Insufficient documentation

## 2011-06-11 DIAGNOSIS — Z8673 Personal history of transient ischemic attack (TIA), and cerebral infarction without residual deficits: Secondary | ICD-10-CM | POA: Insufficient documentation

## 2011-06-11 DIAGNOSIS — R109 Unspecified abdominal pain: Secondary | ICD-10-CM | POA: Insufficient documentation

## 2011-06-11 DIAGNOSIS — R110 Nausea: Secondary | ICD-10-CM | POA: Insufficient documentation

## 2011-06-11 DIAGNOSIS — K219 Gastro-esophageal reflux disease without esophagitis: Secondary | ICD-10-CM | POA: Insufficient documentation

## 2011-06-11 NOTE — ED Notes (Addendum)
No N/V/D. L sided abdominal pain. Staff at [facility] state that he has been to the hospital several times prior to this visit for same symptoms. [facility] Staff states that he has AMS but was behaving normally? Answered all questions appropriately for EMS. Hx of stroke and residual weakness on L side.

## 2011-06-11 NOTE — ED Notes (Signed)
Patient brought in by EMS from [facility]. Patient alert and oriented x 3, to self, place and time. Unable to say why he was brought to the ED tonight. Patient denies pain or any other complaints at this time.

## 2011-06-12 LAB — POCT I-STAT, CHEM 8
BUN: 17 mg/dL (ref 6–23)
Calcium, Ion: 1.17 mmol/L (ref 1.12–1.32)
Creatinine, Ser: 1 mg/dL (ref 0.50–1.35)
Glucose, Bld: 88 mg/dL (ref 70–99)
Hemoglobin: 14.6 g/dL (ref 13.0–17.0)
TCO2: 26 mmol/L (ref 0–100)

## 2011-06-12 LAB — URINALYSIS, ROUTINE W REFLEX MICROSCOPIC
Glucose, UA: NEGATIVE mg/dL
Hgb urine dipstick: NEGATIVE
Leukocytes, UA: NEGATIVE
Nitrite: NEGATIVE
Protein, ur: NEGATIVE mg/dL
Specific Gravity, Urine: 1.026 (ref 1.005–1.030)
Urobilinogen, UA: 1 mg/dL (ref 0.0–1.0)
pH: 5 (ref 5.0–8.0)

## 2011-06-12 MED ORDER — ONDANSETRON 8 MG PO TBDP
8.0000 mg | ORAL_TABLET | Freq: Once | ORAL | Status: AC
  Administered 2011-06-12: 8 mg via ORAL
  Filled 2011-06-12: qty 1

## 2011-06-12 MED ORDER — ONDANSETRON 8 MG PO TBDP: 8.0000 mg | ORAL_TABLET | Freq: Three times a day (TID) | ORAL | Status: AC | PRN

## 2011-06-12 NOTE — ED Notes (Signed)
[facility] called for transport back to [facility] 

## 2011-06-12 NOTE — ED Provider Notes (Signed)
Medical screening examination/treatment/procedure(s) were performed by non-physician practitioner and as supervising physician I was immediately available for consultation/collaboration.   Hanley Seamen, MD 06/12/11 [phone]

## 2011-06-12 NOTE — Discharge Instructions (Signed)
  Please review the instructions below. Steven [name] was seen in the emergency room tonight for his reported nausea and intermittent abdominal pain. He has denied abdominal pain in the emergency department. He was however given Zofran and has tolerated  PO fluids without difficulty. His blood work and urine were without acute findings. We have prescribed a short course of medication for nausea that can be can administered as directed for his complaints of nausea. Please arrange for him to be reevaluated by his primary care physician sometime in the next few days. Return him to the emergency department if he has worsening symptoms.    Abdominal Pain Many things can cause belly (abdominal) pain. Most times, the belly pain is not dangerous. The amount of belly pain does not tell how serious the problem may be. Many cases of belly pain can be watched and treated at home. HOME CARE   Do not take medicines that help you go poop (laxatives) unless told to by your doctor.   Only take medicine as told by your doctor.   Eat or drink as told by your doctor. Your doctor will tell you if you should be on a special diet.  GET HELP RIGHT AWAY IF:   The pain does not go away.   You have a fever.   You keep throwing up (vomiting).   The pain changes and is only in the right or left part of the belly.   You have bloody or tarry looking poop.  MAKE SURE YOU:   Understand these instructions.   Will watch your condition.   Will get help right away if you are not doing well or get worse.  Document Released: 08/13/2007 Document Revised: 02/13/2011 Document Reviewed: 03/12/2009 [facility] Patient Information 2012 [place], [place].Nausea, Adult Nausea is the feeling that you have an upset stomach or have to vomit. Nausea by itself is not likely a serious concern, but it may be an early sign of more serious medical problems. As nausea gets worse, it can lead to vomiting. If vomiting develops, there is the risk of  dehydration.  CAUSES   Viral infections.   Food poisoning.   Medicines.   Pregnancy.   Motion sickness.   Migraine headaches.   Emotional distress.   Severe pain from any source.   Alcohol intoxication.  HOME CARE INSTRUCTIONS  Get plenty of rest.   Ask your caregiver about specific rehydration instructions.   Eat small amounts of food and sip liquids more often.   Take all medicines as told by your caregiver.  SEEK MEDICAL CARE IF:  You have not improved after 2 days, or you get worse.   You have a headache.  SEEK IMMEDIATE MEDICAL CARE IF:   You have a fever.   You faint.   You keep vomiting or have blood in your vomit.   You are extremely weak or dehydrated.   You have dark or bloody stools.   You have severe chest or abdominal pain.  MAKE SURE YOU:  Understand these instructions.   Will watch your condition.   Will get help right away if you are not doing well or get worse.  Document Released: 04/03/2004 Document Revised: 02/13/2011 Document Reviewed: 11/06/2010 [facility] Patient Information 2012 [place], [place].

## 2011-06-12 NOTE — ED Provider Notes (Signed)
History     CSN: [id number]  Arrival date & time 06/11/11  2234   First MD Initiated Contact with Patient 06/11/11 2329      Chief Complaint  Patient presents with  . Abdominal Pain   HPI: Patient is a 61 y.o. male presenting with abdominal pain. The history is provided by the patient and the nursing home.  Abdominal Pain The primary symptoms of the illness include abdominal pain and nausea. The primary symptoms of the illness do not include fever, shortness of breath, vomiting, diarrhea, hematemesis, hematochezia or dysuria. The current episode started 6 to 12 hours ago. The onset of the illness was gradual. The problem has been resolved.  The patient states that she believes she is currently not pregnant. Risk factors for an acute abdominal problem include being elderly.  Upon initial examination patient questioned as to why he is in the emergency department tonight and patient states he does not know why he is here. States that the staff at the nursing home told him to come. Patient does admit that "my stomach hurts every now and then". RN spoke with staff at [facility] who stated that patient had complained of nausea and occasional abdominal pain today and was "giving them a hard time". Apparently there was no history for vomiting, diarrhea, fever, cough or other complaints. Past Medical History  Diagnosis Date  . Lumbar spondylitis   . Anxiety   . Urinary incontinence   . GERD (gastroesophageal reflux disease)   . Stroke   . Chronic pain   . Hypertonicity of bladder   . Fracture of left humerus     History reviewed. No pertinent past surgical history.  No family history on file.  History  Substance Use Topics  . Smoking status: Never Smoker   . Smokeless tobacco: Not on file  . Alcohol Use: Yes     6pack a week      Review of Systems  Constitutional: Negative.  Negative for fever.  HENT: Negative.   Eyes: Negative.   Respiratory: Negative.  Negative  for shortness of breath.   Cardiovascular: Negative.   Gastrointestinal: Positive for nausea and abdominal pain. Negative for vomiting, diarrhea, hematochezia and hematemesis.  Genitourinary: Negative.  Negative for dysuria.  Musculoskeletal: Negative.   Skin: Negative.   Neurological: Negative.   Hematological: Negative.   Psychiatric/Behavioral: Negative.     Allergies  Penicillins  Home Medications   Current Outpatient Rx  Name Route Sig Dispense Refill  . VITAMIN D 1000 UNITS PO TABS Oral Take 1,000 Units by mouth daily.    Marland Kitchen CLOPIDOGREL BISULFATE 75 MG PO TABS Oral Take 75 mg by mouth daily.    Marland Kitchen LOPERAMIDE HCL 2 MG PO CAPS Oral Take 1 capsule (2 mg total) by mouth 4 (four) times daily as needed for diarrhea or loose stools. 12 capsule 0  . ADULT MULTIVITAMIN W/MINERALS CH Oral Take 1 tablet by mouth daily.    Marland Kitchen OMEPRAZOLE 20 MG PO CPDR Oral Take 40 mg by mouth daily.    . OXYCODONE-ACETAMINOPHEN 5-325 MG PO TABS Oral Take 1 tablet by mouth every 4 (four) hours as needed for pain. 10 tablet 0  . PROMETHAZINE HCL 25 MG PO TABS Oral Take 25 mg by mouth every 6 (six) hours as needed. Nausea    . SIMVASTATIN 20 MG PO TABS Oral Take 20 mg by mouth at bedtime.    Marland Kitchen PROMETHAZINE HCL 25 MG PO TABS Oral Take 1  tablet (25 mg total) by mouth every 6 (six) hours as needed for nausea. 30 tablet 0    BP 108/74  Pulse 97  Temp(Src) 98.7 F (37.1 C) (Oral)  Resp 15  SpO2 96%  Physical Exam  Constitutional: He is oriented to person, place, and time. He appears well-developed and well-nourished.  HENT:  Head: Normocephalic and atraumatic.  Eyes: Conjunctivae are normal.  Neck: Neck supple.  Cardiovascular: Normal rate and regular rhythm.   Pulmonary/Chest: Effort normal and breath sounds normal.  Abdominal: Soft. Bowel sounds are normal.       No focal abdominal TTP  Musculoskeletal: Normal range of motion.  Neurological: He is alert and oriented to person, place, and time.  Skin:  Skin is warm and dry.  Psychiatric: He has a normal mood and affect.    ED Course  Procedures   The patient has denied abdominal pain in the emergency department. His exam is unremarkable. He was given Zofran ODT and has tolerated by mouth fluids without difficulty. Will plan for discharge back to the nursing home with a short course of ODT Zofran for nausea and encourage staff to arrange follow up with his PCP sometime in next few days.  Labs Reviewed  URINALYSIS, ROUTINE W REFLEX MICROSCOPIC - Abnormal; Notable for the following:    Color, Urine AMBER (*) BIOCHEMICALS MAY BE AFFECTED BY COLOR   Bilirubin Urine SMALL (*)    Ketones, ur TRACE (*)    All other components within normal limits  POCT I-STAT, CHEM 8   No results found.   No diagnosis found.    MDM  HPI/PE and clinical findings c/w 1. Nausea (reported by staff at nursing home the patient has had frequent complaints of nausea today without vomiting. Patient admits to occasional nausea. Given Zofran in the ED and has tolerated PO fluids w/o difficulty) 2. Abd pain. (Reported by staff at nursing home the patient complains of intermittent abdominal pain. Patient denies abdominal pain at this time and has a benign abdominal exam). Patient will be sent back to the nursing home with instructions to give Zofran as directed when necessary nausea and to arrange for patient to be followed up by primary care physician as soon as possible.       Leanne Chang, NP 06/12/11 0315   Roma Kayser Jabar Krysiak, NP 06/12/11 [phone]

## 2011-06-25 ENCOUNTER — Emergency Department (HOSPITAL_COMMUNITY)
Admission: EM | Admit: 2011-06-25 | Discharge: 2011-06-25 | Disposition: A | Discharge status: Home / Self Care | Payer: Medicaid Other | Attending: Emergency Medicine | Admitting: Emergency Medicine

## 2011-06-25 ENCOUNTER — Encounter (HOSPITAL_COMMUNITY): Payer: Self-pay | Admitting: Family Medicine

## 2011-06-25 ENCOUNTER — Emergency Department (HOSPITAL_COMMUNITY): Payer: Medicaid Other

## 2011-06-25 DIAGNOSIS — M25569 Pain in unspecified knee: Secondary | ICD-10-CM | POA: Insufficient documentation

## 2011-06-25 DIAGNOSIS — Z8673 Personal history of transient ischemic attack (TIA), and cerebral infarction without residual deficits: Secondary | ICD-10-CM | POA: Insufficient documentation

## 2011-06-25 DIAGNOSIS — W06XXXA Fall from bed, initial encounter: Secondary | ICD-10-CM | POA: Insufficient documentation

## 2011-06-25 DIAGNOSIS — Z79899 Other long term (current) drug therapy: Secondary | ICD-10-CM | POA: Insufficient documentation

## 2011-06-25 DIAGNOSIS — S82009A Unspecified fracture of unspecified patella, initial encounter for closed fracture: Secondary | ICD-10-CM | POA: Insufficient documentation

## 2011-06-25 DIAGNOSIS — G8929 Other chronic pain: Secondary | ICD-10-CM | POA: Insufficient documentation

## 2011-06-25 DIAGNOSIS — M25469 Effusion, unspecified knee: Secondary | ICD-10-CM | POA: Insufficient documentation

## 2011-06-25 DIAGNOSIS — K219 Gastro-esophageal reflux disease without esophagitis: Secondary | ICD-10-CM | POA: Insufficient documentation

## 2011-06-25 NOTE — ED Provider Notes (Signed)
History     CSN: [id number]  Arrival date & time 06/25/11  1226   First MD Initiated Contact with Patient 06/25/11 1421      Chief Complaint  Patient presents with  . Knee Pain  . Fall    (Consider location/radiation/quality/duration/timing/severity/associated sxs/prior treatment) Patient is a 61 y.o. male presenting with knee pain. The history is provided by the patient.  Knee Pain This is a new problem. The current episode started yesterday. The problem occurs constantly. The problem has been unchanged. Associated symptoms include arthralgias and joint swelling. Pertinent negatives include no numbness or weakness. The symptoms are aggravated by bending and standing. He has tried nothing for the symptoms.  Pt states he is wheelchair bound due to prior stroke, he transfers himself. States yestetrday, got out of his bed and fell onto right knee. Reports pain in right knee. Pain with bending and standing. Pt denies any other injuries. Denies hitting his head. Denies LOC.   Past Medical History  Diagnosis Date  . Lumbar spondylitis   . Anxiety   . Urinary incontinence   . GERD (gastroesophageal reflux disease)   . Stroke   . Chronic pain   . Hypertonicity of bladder   . Fracture of left humerus     History reviewed. No pertinent past surgical history.  History reviewed. No pertinent family history.  History  Substance Use Topics  . Smoking status: Never Smoker   . Smokeless tobacco: Not on file  . Alcohol Use: Yes     6pack a week      Review of Systems  Constitutional: Negative.   Respiratory: Negative.   Cardiovascular: Negative.   Gastrointestinal: Negative.   Musculoskeletal: Positive for joint swelling and arthralgias. Negative for back pain.  Skin: Negative.   Neurological: Negative for weakness, light-headedness and numbness.  Psychiatric/Behavioral: Negative.     Allergies  Penicillins  Home Medications   Current Outpatient Rx  Name Route Sig  Dispense Refill  . VITAMIN D 1000 UNITS PO TABS Oral Take 1,000 Units by mouth daily.    Marland Kitchen CLOPIDOGREL BISULFATE 75 MG PO TABS Oral Take 75 mg by mouth daily.    Marland Kitchen LOPERAMIDE HCL 2 MG PO CAPS Oral Take 2 mg by mouth 4 (four) times daily as needed. Loose stool    . ADULT MULTIVITAMIN W/MINERALS CH Oral Take 1 tablet by mouth daily.    Marland Kitchen OMEPRAZOLE 20 MG PO CPDR Oral Take 40 mg by mouth daily.    Marland Kitchen ONDANSETRON HCL 8 MG PO TABS Oral Take 8 mg by mouth every 8 (eight) hours as needed.    . OXYCODONE-ACETAMINOPHEN 5-325 MG PO TABS Oral Take 1 tablet by mouth every 4 (four) hours as needed. pain    . SIMVASTATIN 20 MG PO TABS Oral Take 20 mg by mouth at bedtime.    Marland Kitchen PROMETHAZINE HCL 25 MG PO TABS Oral Take 1 tablet (25 mg total) by mouth every 6 (six) hours as needed for nausea. 30 tablet 0  . PROMETHAZINE HCL 25 MG PO TABS Oral Take 25 mg by mouth every 6 (six) hours as needed. Nausea      BP 112/94  Pulse 93  Temp(Src) 98.3 F (36.8 C) (Oral)  Resp 18  SpO2 100%  Physical Exam  Nursing note and vitals reviewed. Constitutional: He is oriented to person, place, and time. He appears well-developed and well-nourished. No distress.  HENT:  Head: Normocephalic.  Eyes: Conjunctivae are normal.  Neck: Neck supple.  Cardiovascular: Normal rate, regular rhythm and normal heart sounds.   Pulmonary/Chest: Effort normal and breath sounds normal. No respiratory distress.  Musculoskeletal: He exhibits tenderness.       Mild swelling noted over anterior right knee. Tender to palpation over his patella. Patellar tendon intact. Joint stable with negative anterior and posterior drawer sighs. Full ROM of the knee, pain with ROM.   Neurological: He is alert and oriented to person, place, and time.  Skin: Skin is warm and dry.  Psychiatric: He has a normal mood and affect.    ED Course  Procedures (including critical care time)  Labs Reviewed - No data to display Dg Knee Complete 4 Views  Right  06/25/2011  *RADIOLOGY REPORT*  Clinical Data: Anterior knee pain since a fall.  RIGHT KNEE - COMPLETE 4+ VIEW  Comparison: Plain films 07/16/2010.  Findings: The patient has a transverse fracture through the mid pole of the patella.  The fracture shows minimal distraction. Anterior soft tissue swelling noted.  No other acute bony or joint abnormality.  Joint spaces are preserved.  Atherosclerosis noted.  IMPRESSION: Acute, mildly distracted transverse fracture through the mid pole of the patella.  Original Report Authenticated By: Bernadene Bell. Maricela Curet, M.D.   Pt with patellar fracture. He is at baseline non ambulatory, wheelchair bound. Spoke with Dr.Graves who recommended immobilizer and follow up next week with him in the office.   No diagnosis found.    MDM          Lottie Mussel, PA 06/25/11 1539

## 2011-06-25 NOTE — ED Notes (Signed)
Pt reports he slid out of his bed and fell-landed on his R knee.  No swelling or deformity noted at this time

## 2011-06-25 NOTE — ED Notes (Signed)
Patient reports that he fell yesterday striking right knee, slipped out of bed striking table. Reports pain with ambulation

## 2011-06-25 NOTE — ED Notes (Signed)
Assisted pt to the BR in a wheelchair

## 2011-06-25 NOTE — ED Notes (Signed)
Per EMS: Pt from [facility]. Reports fall yesterday, denies loc. Denies neck or back pain today. Reports right knee pain. No swelling or deformity noted. Normally ambulatory, unable to since fall.

## 2011-06-25 NOTE — Discharge Instructions (Signed)
You have a fracture in your patella bone. Keep immobilizer on at all times. Ice knee several times a day. Continue your pain medications. Call and get an appointment with Dr.Graves next week in the office.   Knee Fracture, Adult A knee fracture is a break in any of the bones of the lower part of the thigh bone, the upper part of the bones of the lower leg, or of the kneecap. When the bones no longer meet the way they are supposed to it is called a dislocation. Sometimes there can be a dislocation along with fractures. SYMPTOMS  Symptoms may include pain, swelling, inability to bend the knee, deformity of the knee, and inability to walk.  DIAGNOSIS  This problem is usually diagnosed with x-rays. Special studies are sometimes done if a fracture is suspected but cannot be seen on ordinary x-rays. If vessels around the knee are injured, special tests may be done to see what the damage is. TREATMENT   The leg is usually splinted for the first couple of days to allow for swelling. After the swelling is down a cast is put on. Sometimes a cast is put on right away with the sides of the cast cut to allow the knee to swell. If the bones are in place, this may be all that is needed.   If the bones are out of place, medications for pain are given to allow them to be put back in place. If they are seriously out of place, surgery may be needed to hold the pieces or breaks in place using wires, pins, screws or metal plates.   Generally most fractures will heal in 4 to 6 weeks.  HOME CARE INSTRUCTIONS   Use your crutches as directed.   To lessen the swelling, keep the injured leg elevated while sitting or lying down.   Apply ice to the injury for 15 to 20 minutes, 3 to 4 times per day while awake for 2 days. Put the ice in a plastic bag and place a thin towel between the bag of ice and your cast.   If you have a plaster or fiberglass cast:   Do not try to scratch the skin under the cast using sharp or  pointed objects.   Check the skin around the cast every day. You may put lotion on any red or sore areas.   Keep your cast dry and clean.   If you have a plaster splint:   Wear the splint as directed.   You may loosen the elastic around the splint if your toes become numb, tingle, or turn cold or blue.   Do not put pressure on any part of your cast or splint; it may break. Rest your cast only on a pillow the first 24 hours until it is fully hardened.   Your cast or splint can be protected during bathing with a plastic bag. Do not lower the cast or splint into water.   Only take over-the-counter or prescription medicines for pain, discomfort, or fever as directed by your caregiver.   See your caregiver soon if your cast gets damaged or breaks.   It is very important to keep all follow up appointments. Not following up as directed may result in a worsening of your condition or a failure of the fracture to heal properly.  SEEK IMMEDIATE MEDICAL CARE IF:  You have continued severe pain.   You have more swelling than you did before the cast was put on.  The area below the fracture becomes painful.   Your skin or toenails below the injury turn blue or gray, or feel cold or numb.   There is drainage coming from under the cast.   New, unexplained symptoms develop (drugs used in treatment may produce side effects).  MAKE SURE YOU:   Understand these instructions.   Will watch your condition.   Will get help right away if you are not doing well or get worse.  Document Released: 01/07/2006 Document Revised: 02/13/2011 Document Reviewed: 02/08/2007 [facility] Patient Information 2012 [place], [place].

## 2011-06-25 NOTE — ED Notes (Signed)
[facility] ortho tech contacted to place knee immobilizer on pt.  [facility] contacted for transport back to [facility]

## 2011-06-26 NOTE — ED Provider Notes (Signed)
Medical screening examination/treatment/procedure(s) were performed by non-physician practitioner and as supervising physician I was immediately available for consultation/collaboration.  Ameliyah Sarno, MD 06/26/11 1749 

## 2011-07-31 ENCOUNTER — Encounter (HOSPITAL_COMMUNITY): Payer: Self-pay

## 2011-07-31 ENCOUNTER — Emergency Department (HOSPITAL_COMMUNITY): Payer: Medicaid Other

## 2011-07-31 ENCOUNTER — Emergency Department (HOSPITAL_COMMUNITY)
Admission: EM | Admit: 2011-07-31 | Discharge: 2011-07-31 | Disposition: A | Discharge status: Home Health | Payer: Medicaid Other | Attending: Emergency Medicine | Admitting: Emergency Medicine

## 2011-07-31 DIAGNOSIS — Z8673 Personal history of transient ischemic attack (TIA), and cerebral infarction without residual deficits: Secondary | ICD-10-CM | POA: Insufficient documentation

## 2011-07-31 DIAGNOSIS — G8929 Other chronic pain: Secondary | ICD-10-CM | POA: Insufficient documentation

## 2011-07-31 DIAGNOSIS — W19XXXA Unspecified fall, initial encounter: Secondary | ICD-10-CM

## 2011-07-31 DIAGNOSIS — S0003XA Contusion of scalp, initial encounter: Secondary | ICD-10-CM | POA: Insufficient documentation

## 2011-07-31 DIAGNOSIS — W1809XA Striking against other object with subsequent fall, initial encounter: Secondary | ICD-10-CM | POA: Insufficient documentation

## 2011-07-31 DIAGNOSIS — K219 Gastro-esophageal reflux disease without esophagitis: Secondary | ICD-10-CM | POA: Insufficient documentation

## 2011-07-31 DIAGNOSIS — S0093XA Contusion of unspecified part of head, initial encounter: Secondary | ICD-10-CM

## 2011-07-31 DIAGNOSIS — Z79899 Other long term (current) drug therapy: Secondary | ICD-10-CM | POA: Insufficient documentation

## 2011-07-31 NOTE — ED Notes (Signed)
Called [facility], to check on ride for pt, [facility] states they are running behind

## 2011-07-31 NOTE — ED Provider Notes (Signed)
History     CSN: [id number]  Arrival date & time 07/31/11  1448   First MD Initiated Contact with Patient 07/31/11 1502      Chief Complaint  Patient presents with  . Fall    small abrasion to lt side of head, no loc    HPI The patient presents following a fall with head and R knee pain.  He had a fall one month ago, suffering a patellar fracture on the R.  Since that time he has been (supposedly) wearing an immobilizer.  Today, just pta, he was not wearing his immobilizer when he arose from a chair, felt the sudden onset of pain in the R knee and fell.  He struck his head against a coffee table.  No LOC, no emesis, no visual complaints, no nausea..  Since that fall he has had pain focally about the L parietal scalp and the knee.  No confusion / no disorientation.  Pain (in knee) is worse w motion.  No attempts at analgesia thus far.  Past Medical History  Diagnosis Date  . Lumbar spondylitis   . Anxiety   . Urinary incontinence   . GERD (gastroesophageal reflux disease)   . Stroke   . Chronic pain   . Hypertonicity of bladder   . Fracture of left humerus     History reviewed. No pertinent past surgical history.  No family history on file.  History  Substance Use Topics  . Smoking status: Never Smoker   . Smokeless tobacco: Not on file  . Alcohol Use: Yes     6pack a week      Review of Systems  Constitutional:       Per HPI, otherwise negative  HENT:       Per HPI, otherwise negative  Eyes: Negative.   Respiratory:       Per HPI, otherwise negative  Cardiovascular:       Per HPI, otherwise negative  Gastrointestinal: Negative for vomiting.  Genitourinary: Negative.   Musculoskeletal:       Chronic L shoulder pain.  Currently "the same"  Skin: Negative.   Neurological: Negative for syncope.    Allergies  Penicillins  Home Medications   Current Outpatient Rx  Name Route Sig Dispense Refill  . VITAMIN D 1000 UNITS PO TABS Oral Take 1,000 Units by  mouth daily.    Marland Kitchen CLOPIDOGREL BISULFATE 75 MG PO TABS Oral Take 75 mg by mouth daily.    Marland Kitchen LOPERAMIDE HCL 2 MG PO CAPS Oral Take 2 mg by mouth 4 (four) times daily as needed. Loose stool    . ADULT MULTIVITAMIN W/MINERALS CH Oral Take 1 tablet by mouth daily.    Marland Kitchen OMEPRAZOLE 20 MG PO CPDR Oral Take 40 mg by mouth daily.    Marland Kitchen ONDANSETRON HCL 8 MG PO TABS Oral Take 8 mg by mouth every 8 (eight) hours as needed.    . OXYCODONE-ACETAMINOPHEN 5-325 MG PO TABS Oral Take 1 tablet by mouth every 4 (four) hours as needed. pain    . PROMETHAZINE HCL 25 MG PO TABS Oral Take 1 tablet (25 mg total) by mouth every 6 (six) hours as needed for nausea. 30 tablet 0  . PROMETHAZINE HCL 25 MG PO TABS Oral Take 25 mg by mouth every 6 (six) hours as needed. Nausea    . SIMVASTATIN 20 MG PO TABS Oral Take 20 mg by mouth at bedtime.      BP 107/75  Pulse 81  Temp(Src) 98 F (36.7 C) (Oral)  SpO2 99%  Physical Exam  Constitutional: He appears well-developed and well-nourished. No distress.  HENT:  Head: Normocephalic.    Eyes: Conjunctivae and EOM are normal.  Neck: Trachea normal and full passive range of motion without pain. No spinous process tenderness and no muscular tenderness present.  Cardiovascular: Normal rate and regular rhythm.   Pulmonary/Chest: Effort normal. No respiratory distress.  Abdominal: Soft. He exhibits no distension.  Musculoskeletal:       Left shoulder: He exhibits tenderness, bony tenderness and decreased strength. He exhibits no swelling, no effusion, no crepitus and normal pulse.       Right knee: He exhibits decreased range of motion. He exhibits no swelling, no effusion, no ecchymosis, no deformity and no laceration. tenderness found. No medial joint line and no lateral joint line tenderness noted.       Patient denies any recent changes in his L shoulder.    ED Course  Procedures (including critical care time)  Labs Reviewed - No data to display No results  found.   No diagnosis found.  XR reviewed by me  MDM  This patient presents one month after a fall that resulted in the patellar fracture, now after another fall occurred when he was not wearing his knee immobilizer.  On exam the patient is in no distress.  He has a minor hematoma to the left parietal scalp.  Absent any neural complaints, and with his absence of distress, there is no indication for CT brain.  The patient's knee was x-rayed, and he was discharged in stable condition with instructions to resume wearing his immobilizer as directed.  (XR demonstrated a still distracted fracture).     Gerhard Munch, MD 07/31/11 [phone]

## 2011-07-31 NOTE — ED Notes (Signed)
Called report to [facility] at [place]. Called [facility] for pt to be returned.

## 2011-07-31 NOTE — Discharge Instructions (Signed)
It is very important that you followup with your orthopedist via telephone tomorrow to discuss today's fall and to arrange appropriate followup care.  If you develop any new, or concerning changes in your condition, please return to the emergency department immediately.  It is extremely important that you wear your immobilizer at all times, as directed.

## 2011-07-31 NOTE — ED Notes (Signed)
Per EMS pt from [facility], states stood up out of wheel chair to change tv channel and fell, abrasion to lt side of head, no bleeding noting, denies LOC.

## 2011-07-31 NOTE — ED Notes (Signed)
[facility] came to pick up patient. -- [facility] received facesheet, pt's documents in chart, and discharge paperwork.

## 2012-06-23 ENCOUNTER — Emergency Department (HOSPITAL_COMMUNITY): Payer: Medicaid Other

## 2012-06-23 ENCOUNTER — Inpatient Hospital Stay (HOSPITAL_COMMUNITY)
Admission: EM | Admit: 2012-06-23 | Discharge: 2012-07-15 | DRG: 480 | Disposition: A | Discharge status: Skilled Nursing Facility | Payer: Medicaid Other | Attending: Internal Medicine | Admitting: Internal Medicine

## 2012-06-23 ENCOUNTER — Encounter (HOSPITAL_COMMUNITY): Payer: Self-pay

## 2012-06-23 DIAGNOSIS — J189 Pneumonia, unspecified organism: Secondary | ICD-10-CM | POA: Diagnosis not present

## 2012-06-23 DIAGNOSIS — S42302D Unspecified fracture of shaft of humerus, left arm, subsequent encounter for fracture with routine healing: Secondary | ICD-10-CM

## 2012-06-23 DIAGNOSIS — K838 Other specified diseases of biliary tract: Secondary | ICD-10-CM | POA: Clinically undetermined

## 2012-06-23 DIAGNOSIS — K8020 Calculus of gallbladder without cholecystitis without obstruction: Secondary | ICD-10-CM

## 2012-06-23 DIAGNOSIS — N318 Other neuromuscular dysfunction of bladder: Secondary | ICD-10-CM | POA: Diagnosis present

## 2012-06-23 DIAGNOSIS — K55059 Acute (reversible) ischemia of intestine, part and extent unspecified: Secondary | ICD-10-CM | POA: Diagnosis not present

## 2012-06-23 DIAGNOSIS — K859 Acute pancreatitis without necrosis or infection, unspecified: Secondary | ICD-10-CM | POA: Clinically undetermined

## 2012-06-23 DIAGNOSIS — K219 Gastro-esophageal reflux disease without esophagitis: Secondary | ICD-10-CM

## 2012-06-23 DIAGNOSIS — I69959 Hemiplegia and hemiparesis following unspecified cerebrovascular disease affecting unspecified side: Secondary | ICD-10-CM

## 2012-06-23 DIAGNOSIS — Z6826 Body mass index (BMI) 26.0-26.9, adult: Secondary | ICD-10-CM

## 2012-06-23 DIAGNOSIS — K55069 Acute infarction of intestine, part and extent unspecified: Secondary | ICD-10-CM | POA: Diagnosis not present

## 2012-06-23 DIAGNOSIS — G894 Chronic pain syndrome: Secondary | ICD-10-CM

## 2012-06-23 DIAGNOSIS — R630 Anorexia: Secondary | ICD-10-CM

## 2012-06-23 DIAGNOSIS — F1021 Alcohol dependence, in remission: Secondary | ICD-10-CM

## 2012-06-23 DIAGNOSIS — R209 Unspecified disturbances of skin sensation: Secondary | ICD-10-CM

## 2012-06-23 DIAGNOSIS — E860 Dehydration: Secondary | ICD-10-CM

## 2012-06-23 DIAGNOSIS — F1010 Alcohol abuse, uncomplicated: Secondary | ICD-10-CM

## 2012-06-23 DIAGNOSIS — E876 Hypokalemia: Secondary | ICD-10-CM | POA: Diagnosis not present

## 2012-06-23 DIAGNOSIS — R32 Unspecified urinary incontinence: Secondary | ICD-10-CM | POA: Diagnosis present

## 2012-06-23 DIAGNOSIS — M84429A Pathological fracture, unspecified humerus, initial encounter for fracture: Secondary | ICD-10-CM | POA: Diagnosis present

## 2012-06-23 DIAGNOSIS — F411 Generalized anxiety disorder: Secondary | ICD-10-CM | POA: Diagnosis present

## 2012-06-23 DIAGNOSIS — M47817 Spondylosis without myelopathy or radiculopathy, lumbosacral region: Secondary | ICD-10-CM

## 2012-06-23 DIAGNOSIS — I959 Hypotension, unspecified: Secondary | ICD-10-CM

## 2012-06-23 DIAGNOSIS — S72142A Displaced intertrochanteric fracture of left femur, initial encounter for closed fracture: Secondary | ICD-10-CM

## 2012-06-23 DIAGNOSIS — S72143A Displaced intertrochanteric fracture of unspecified femur, initial encounter for closed fracture: Principal | ICD-10-CM | POA: Diagnosis present

## 2012-06-23 DIAGNOSIS — M4690 Unspecified inflammatory spondylopathy, site unspecified: Secondary | ICD-10-CM | POA: Diagnosis present

## 2012-06-23 DIAGNOSIS — R7881 Bacteremia: Secondary | ICD-10-CM

## 2012-06-23 DIAGNOSIS — I6350 Cerebral infarction due to unspecified occlusion or stenosis of unspecified cerebral artery: Secondary | ICD-10-CM | POA: Diagnosis present

## 2012-06-23 DIAGNOSIS — S42309A Unspecified fracture of shaft of humerus, unspecified arm, initial encounter for closed fracture: Secondary | ICD-10-CM

## 2012-06-23 DIAGNOSIS — B961 Klebsiella pneumoniae [K. pneumoniae] as the cause of diseases classified elsewhere: Secondary | ICD-10-CM | POA: Diagnosis present

## 2012-06-23 DIAGNOSIS — R933 Abnormal findings on diagnostic imaging of other parts of digestive tract: Secondary | ICD-10-CM | POA: Clinically undetermined

## 2012-06-23 DIAGNOSIS — S72002D Fracture of unspecified part of neck of left femur, subsequent encounter for closed fracture with routine healing: Secondary | ICD-10-CM

## 2012-06-23 DIAGNOSIS — B952 Enterococcus as the cause of diseases classified elsewhere: Secondary | ICD-10-CM

## 2012-06-23 DIAGNOSIS — E46 Unspecified protein-calorie malnutrition: Secondary | ICD-10-CM | POA: Diagnosis present

## 2012-06-23 DIAGNOSIS — S42302A Unspecified fracture of shaft of humerus, left arm, initial encounter for closed fracture: Secondary | ICD-10-CM

## 2012-06-23 DIAGNOSIS — K8591 Acute pancreatitis with uninfected necrosis, unspecified: Secondary | ICD-10-CM

## 2012-06-23 DIAGNOSIS — S72002A Fracture of unspecified part of neck of left femur, initial encounter for closed fracture: Secondary | ICD-10-CM

## 2012-06-23 DIAGNOSIS — D7389 Other diseases of spleen: Secondary | ICD-10-CM | POA: Diagnosis present

## 2012-06-23 DIAGNOSIS — D72829 Elevated white blood cell count, unspecified: Secondary | ICD-10-CM | POA: Diagnosis present

## 2012-06-23 DIAGNOSIS — D638 Anemia in other chronic diseases classified elsewhere: Secondary | ICD-10-CM | POA: Diagnosis present

## 2012-06-23 DIAGNOSIS — I498 Other specified cardiac arrhythmias: Secondary | ICD-10-CM | POA: Diagnosis present

## 2012-06-23 DIAGNOSIS — F0390 Unspecified dementia without behavioral disturbance: Secondary | ICD-10-CM | POA: Diagnosis present

## 2012-06-23 DIAGNOSIS — Z79899 Other long term (current) drug therapy: Secondary | ICD-10-CM

## 2012-06-23 DIAGNOSIS — R000 Tachycardia, unspecified: Secondary | ICD-10-CM

## 2012-06-23 DIAGNOSIS — E871 Hypo-osmolality and hyponatremia: Secondary | ICD-10-CM | POA: Diagnosis not present

## 2012-06-23 DIAGNOSIS — I252 Old myocardial infarction: Secondary | ICD-10-CM

## 2012-06-23 DIAGNOSIS — W19XXXA Unspecified fall, initial encounter: Secondary | ICD-10-CM | POA: Diagnosis present

## 2012-06-23 DIAGNOSIS — K8689 Other specified diseases of pancreas: Secondary | ICD-10-CM | POA: Clinically undetermined

## 2012-06-23 HISTORY — DX: Alcoholic hepatitis without ascites: K70.10

## 2012-06-23 HISTORY — DX: Alcohol dependence, in remission: F10.21

## 2012-06-23 HISTORY — DX: Other psychoactive substance abuse, uncomplicated: F19.10

## 2012-06-23 HISTORY — DX: Epistaxis: R04.0

## 2012-06-23 HISTORY — DX: Intracardiac thrombosis, not elsewhere classified: I51.3

## 2012-06-23 LAB — CBC WITH DIFFERENTIAL/PLATELET
Eosinophils Absolute: 0 10*3/uL (ref 0.0–0.7)
Eosinophils Relative: 0 % (ref 0–5)
HCT: 47.7 % (ref 39.0–52.0)
Lymphocytes Relative: 6 % — ABNORMAL LOW (ref 12–46)
Lymphs Abs: 1.2 10*3/uL (ref 0.7–4.0)
MCH: 29.4 pg (ref 26.0–34.0)
MCV: 81.4 fL (ref 78.0–100.0)
Monocytes Absolute: 1.2 10*3/uL — ABNORMAL HIGH (ref 0.1–1.0)
Platelets: 193 10*3/uL (ref 150–400)
RBC: 5.86 MIL/uL — ABNORMAL HIGH (ref 4.22–5.81)
RDW: 12.8 % (ref 11.5–15.5)

## 2012-06-23 MED ORDER — SODIUM CHLORIDE 0.9 % IV SOLN: Freq: Once | INTRAVENOUS | Status: DC

## 2012-06-23 MED ORDER — DILTIAZEM HCL 25 MG/5ML IV SOLN
20.0000 mg | Freq: Once | INTRAVENOUS | Status: AC
  Administered 2012-06-24: 20 mg via INTRAVENOUS
  Filled 2012-06-23: qty 5

## 2012-06-23 NOTE — ED Notes (Signed)
NGE:XBMW<UX> Expected date:<BR> Expected time:<BR> Means of arrival:<BR> Comments:<BR> EMS/61 yo male from SNF-fall

## 2012-06-23 NOTE — ED Notes (Signed)
Pt arrives from [name]-fall 30 minutes ago-hx CVA in past-tried to stand and pushed over his night stand-unwitnessed fall-c/o left upper leg pain-no LOC

## 2012-06-24 ENCOUNTER — Encounter (HOSPITAL_COMMUNITY): Payer: Self-pay | Admitting: Internal Medicine

## 2012-06-24 ENCOUNTER — Inpatient Hospital Stay (HOSPITAL_COMMUNITY): Payer: Medicaid Other

## 2012-06-24 DIAGNOSIS — S72143A Displaced intertrochanteric fracture of unspecified femur, initial encounter for closed fracture: Secondary | ICD-10-CM

## 2012-06-24 DIAGNOSIS — D72829 Elevated white blood cell count, unspecified: Secondary | ICD-10-CM

## 2012-06-24 DIAGNOSIS — F0390 Unspecified dementia without behavioral disturbance: Secondary | ICD-10-CM | POA: Diagnosis present

## 2012-06-24 DIAGNOSIS — I498 Other specified cardiac arrhythmias: Secondary | ICD-10-CM

## 2012-06-24 DIAGNOSIS — S72002A Fracture of unspecified part of neck of left femur, initial encounter for closed fracture: Secondary | ICD-10-CM

## 2012-06-24 DIAGNOSIS — S42302A Unspecified fracture of shaft of humerus, left arm, initial encounter for closed fracture: Secondary | ICD-10-CM

## 2012-06-24 LAB — CBC
HCT: 44 % (ref 39.0–52.0)
Hemoglobin: 15.6 g/dL (ref 13.0–17.0)
MCHC: 35.5 g/dL (ref 30.0–36.0)
RBC: 5.36 MIL/uL (ref 4.22–5.81)

## 2012-06-24 LAB — TYPE AND SCREEN
ABO/RH(D): A POS
Antibody Screen: NEGATIVE

## 2012-06-24 LAB — BASIC METABOLIC PANEL
BUN: 17 mg/dL (ref 6–23)
BUN: 19 mg/dL (ref 6–23)
CO2: 20 mEq/L (ref 19–32)
CO2: 20 mEq/L (ref 19–32)
Calcium: 9.3 mg/dL (ref 8.4–10.5)
Chloride: 100 mEq/L (ref 96–112)
Creatinine, Ser: 0.74 mg/dL (ref 0.50–1.35)
GFR calc non Af Amer: >90 mL/min (ref >90)
GFR calc non Af Amer: >90 mL/min (ref >90)
Glucose, Bld: 129 mg/dL — ABNORMAL HIGH (ref 70–99)
Glucose, Bld: 144 mg/dL — ABNORMAL HIGH (ref 70–99)
Potassium: 3.7 mEq/L (ref 3.5–5.1)
Sodium: 135 mEq/L (ref 135–145)
Sodium: 136 mEq/L (ref 135–145)

## 2012-06-24 LAB — URINALYSIS, MICROSCOPIC ONLY
Glucose, UA: NEGATIVE mg/dL
Hgb urine dipstick: NEGATIVE
Specific Gravity, Urine: 1.029 (ref 1.005–1.030)
Urobilinogen, UA: 2 mg/dL — ABNORMAL HIGH (ref 0.0–1.0)
pH: 5 (ref 5.0–8.0)

## 2012-06-24 LAB — BLOOD GAS, VENOUS
Acid-base deficit: 2.3 mmol/L — ABNORMAL HIGH (ref 0.0–2.0)
Bicarbonate: 20.3 mEq/L (ref 20.0–24.0)
O2 Content: 3 L/min
O2 Saturation: 94.2 %
TCO2: 17.1 mmol/L (ref 0–100)
pCO2, Ven: 31.4 mmHg — ABNORMAL LOW (ref 45.0–50.0)
pO2, Ven: 71.7 mmHg — ABNORMAL HIGH (ref 30.0–45.0)

## 2012-06-24 LAB — TSH: TSH: 0.757 u[IU]/mL (ref 0.350–4.500)

## 2012-06-24 LAB — ABO/RH: ABO/RH(D): A POS

## 2012-06-24 LAB — PRO B NATRIURETIC PEPTIDE: Pro B Natriuretic peptide (BNP): 1830 pg/mL — ABNORMAL HIGH (ref 0–125)

## 2012-06-24 MED ORDER — SODIUM CHLORIDE 0.9 % IV BOLUS (SEPSIS)
1000.0000 mL | Freq: Once | INTRAVENOUS | Status: AC
  Administered 2012-06-24: 1000 mL via INTRAVENOUS

## 2012-06-24 MED ORDER — HYDROCODONE-ACETAMINOPHEN 5-325 MG PO TABS
1.0000 | ORAL_TABLET | Freq: Four times a day (QID) | ORAL | Status: DC | PRN
  Administered 2012-06-24: 2 via ORAL
  Administered 2012-06-25 – 2012-06-26 (×3): 1 via ORAL
  Administered 2012-06-26 – 2012-07-01 (×5): 2 via ORAL
  Administered 2012-07-02: 1 via ORAL
  Filled 2012-06-24 (×4): qty 2
  Filled 2012-06-24 (×2): qty 1
  Filled 2012-06-24: qty 2
  Filled 2012-06-24: qty 1
  Filled 2012-06-24: qty 2
  Filled 2012-06-24: qty 1

## 2012-06-24 MED ORDER — FENTANYL CITRATE 0.05 MG/ML IJ SOLN
INTRAMUSCULAR | Status: AC
  Administered 2012-06-24: 50 ug via INTRAVENOUS
  Filled 2012-06-24: qty 2

## 2012-06-24 MED ORDER — DEXTROSE 5 % IV SOLN
1.0000 g | Freq: Three times a day (TID) | INTRAVENOUS | Status: DC
  Administered 2012-06-24 – 2012-06-27 (×9): 1 g via INTRAVENOUS
  Filled 2012-06-24 (×11): qty 1

## 2012-06-24 MED ORDER — TAMSULOSIN HCL 0.4 MG PO CAPS
0.4000 mg | ORAL_CAPSULE | Freq: Every day | ORAL | Status: DC
  Administered 2012-06-24 – 2012-07-14 (×18): 0.4 mg via ORAL
  Filled 2012-06-24 (×22): qty 1

## 2012-06-24 MED ORDER — IOHEXOL 350 MG/ML SOLN
100.0000 mL | Freq: Once | INTRAVENOUS | Status: AC | PRN
  Administered 2012-06-24: 100 mL via INTRAVENOUS

## 2012-06-24 MED ORDER — FENTANYL CITRATE 0.05 MG/ML IJ SOLN: 100.0000 ug | Freq: Once | INTRAMUSCULAR | Status: AC

## 2012-06-24 MED ORDER — MORPHINE SULFATE 2 MG/ML IJ SOLN: 0.5000 mg | INTRAMUSCULAR | Status: DC | PRN

## 2012-06-24 MED ORDER — LEVOFLOXACIN IN D5W 750 MG/150ML IV SOLN
750.0000 mg | INTRAVENOUS | Status: DC
  Administered 2012-06-26 – 2012-06-27 (×2): 750 mg via INTRAVENOUS
  Filled 2012-06-24 (×3): qty 150

## 2012-06-24 MED ORDER — DEXTROSE 5 % IV SOLN
2.0000 g | Freq: Once | INTRAVENOUS | Status: AC
  Administered 2012-06-24: 2 g via INTRAVENOUS
  Filled 2012-06-24 (×2): qty 2

## 2012-06-24 MED ORDER — METOPROLOL TARTRATE 25 MG PO TABS
25.0000 mg | ORAL_TABLET | Freq: Two times a day (BID) | ORAL | Status: DC
  Administered 2012-06-24: 25 mg via ORAL
  Filled 2012-06-24 (×3): qty 1

## 2012-06-24 MED ORDER — METOPROLOL TARTRATE 1 MG/ML IV SOLN
2.5000 mg | Freq: Once | INTRAVENOUS | Status: AC | PRN
  Filled 2012-06-24: qty 5

## 2012-06-24 MED ORDER — LEVOFLOXACIN IN D5W 750 MG/150ML IV SOLN
750.0000 mg | Freq: Once | INTRAVENOUS | Status: AC
  Administered 2012-06-24: 750 mg via INTRAVENOUS
  Filled 2012-06-24: qty 150

## 2012-06-24 MED ORDER — VANCOMYCIN HCL IN DEXTROSE 1-5 GM/200ML-% IV SOLN
1000.0000 mg | Freq: Once | INTRAVENOUS | Status: AC
  Administered 2012-06-24: 1000 mg via INTRAVENOUS
  Filled 2012-06-24 (×2): qty 200

## 2012-06-24 MED ORDER — FENTANYL CITRATE 0.05 MG/ML IJ SOLN: 50.0000 ug | Freq: Once | INTRAMUSCULAR | Status: AC

## 2012-06-24 MED ORDER — PANTOPRAZOLE SODIUM 40 MG PO TBEC
40.0000 mg | DELAYED_RELEASE_TABLET | Freq: Every day | ORAL | Status: DC
  Administered 2012-06-24 – 2012-07-15 (×17): 40 mg via ORAL
  Filled 2012-06-24 (×22): qty 1

## 2012-06-24 MED ORDER — VANCOMYCIN HCL IN DEXTROSE 750-5 MG/150ML-% IV SOLN
750.0000 mg | Freq: Two times a day (BID) | INTRAVENOUS | Status: DC
  Administered 2012-06-24 – 2012-06-26 (×3): 750 mg via INTRAVENOUS
  Filled 2012-06-24 (×5): qty 150

## 2012-06-24 MED ORDER — SODIUM CHLORIDE 0.9 % IV SOLN
INTRAVENOUS | Status: AC
  Administered 2012-06-24: 05:00:00 via INTRAVENOUS
  Administered 2012-06-24: 100 mL/h via INTRAVENOUS

## 2012-06-24 MED ORDER — MORPHINE SULFATE 2 MG/ML IJ SOLN
1.0000 mg | INTRAMUSCULAR | Status: DC | PRN
Start: 2012-06-24 — End: 2012-07-03
  Administered 2012-06-24 – 2012-07-03 (×7): 2 mg via INTRAVENOUS
  Filled 2012-06-24 (×8): qty 1

## 2012-06-24 MED ORDER — FENTANYL CITRATE 0.05 MG/ML IJ SOLN
INTRAMUSCULAR | Status: AC
  Administered 2012-06-24: 100 ug via INTRAVENOUS
  Filled 2012-06-24: qty 2

## 2012-06-24 NOTE — Progress Notes (Signed)
Nutrition Brief Note  RD consulted due to Hip Fracture Protocol  Body mass index is 26.13 kg/(m^2). Patient meets criteria for Overweight based on current BMI. Pt denies wt loss stating that he usually weighs 142 lbs- wt history shows wt maintenance for past year.   Current diet order is NPO but, pt reports having a good appetite and eating 3 meals daly PTA. Labs and medications reviewed.   No nutrition interventions warranted at this time. If nutrition issues arise, please re-consult RD.   Ian Malkin RD, LDN Inpatient Clinical Dietitian Pager: [phone] After Hours Pager: [phone]

## 2012-06-24 NOTE — ED Provider Notes (Addendum)
History     CSN: [id number]  Arrival date & time 06/23/12  2327   First MD Initiated Contact with Patient 06/23/12 2341      Chief Complaint  Patient presents with  . Fall    (Consider location/radiation/quality/duration/timing/severity/associated sxs/prior treatment) HPI Level 5 Caveat: dementia. This is a 62 year old nursing home patient who fell attempting to get out of bed about 30 minutes prior to arrival. The fall was unwitnessed but it was noted that he pushed over his night stand. He is complaining of moderate to severe pain in his left hip. He is also complaining of pain in his left upper arm at the site of a known nonhealing fracture. He was not given any pain medications prior to arrival. He was noted to be tachycardic up to the 160s on arrival.  Past Medical History  Diagnosis Date  . Lumbar spondylitis   . Anxiety   . Urinary incontinence   . GERD (gastroesophageal reflux disease)   . Stroke   . Chronic pain   . Hypertonicity of bladder   . Fracture of left humerus     History reviewed. No pertinent past surgical history.  No family history on file.  History  Substance Use Topics  . Smoking status: Never Smoker   . Smokeless tobacco: Not on file  . Alcohol Use: No     Comment: 6pack a week      Review of Systems  Unable to perform ROS   Allergies  Penicillins  Home Medications   Current Outpatient Rx  Name  Route  Sig  Dispense  Refill  . cholecalciferol (VITAMIN D) 1000 UNITS tablet   Oral   Take 1,000 Units by mouth every morning.          . clopidogrel (PLAVIX) 75 MG tablet   Oral   Take 75 mg by mouth every morning.          . Multiple Vitamin (MULITIVITAMIN WITH MINERALS) TABS   Oral   Take 1 tablet by mouth every morning.          Marland Kitchen omeprazole (PRILOSEC) 20 MG capsule   Oral   Take 40 mg by mouth every morning.          . tamsulosin (FLOMAX) 0.4 MG CAPS   Oral   Take 0.4 mg by mouth at bedtime.           BP  131/78  Pulse 124  Temp(Src) 97.9 F (36.6 C) (Oral)  Resp 27  SpO2 99%  Physical Exam General: Well-developed, well-nourished male in no acute distress; appears older than age of record HENT: normocephalic, atraumatic Eyes: pupils equal round and reactive to light; extraocular muscles intact Neck: supple Heart: regular rate and rhythm; tachycardic Lungs: clear to auscultation bilaterally; tachypnea Abdomen: soft; nondistended; nontender; bowel sounds present Extremities: Chronic appearing deformity of left upper arm with a pseudarthrosis, tender to palpation; tenderness of left hip with shortening and external rotation of left lower extremity; extremity is distally neurovascularly intact Neurologic: Awake, alert; motor function intact in all extremities but examination limited due to fractures; no facial droop; hard of hearing Skin: Warm and dry Psychiatric: Flat affect    ED Course  Procedures (including critical care time)     MDM   Nursing notes and vitals signs, including pulse oximetry, reviewed.  Summary of this visit's results, reviewed by myself:  Labs:  Results for orders placed during the hospital encounter of 06/23/12 (from the past 24 hour(s))  CBC WITH DIFFERENTIAL     Status: Abnormal   Collection Time    06/23/12 11:41 PM      Result Value Range   WBC 20.7 (*) 4.0 - 10.5 K/uL   RBC 5.86 (*) 4.22 - 5.81 MIL/uL   Hemoglobin 17.2 (*) 13.0 - 17.0 g/dL   HCT 01.0  27.2 - 53.6 %   MCV 81.4  78.0 - 100.0 fL   MCH 29.4  26.0 - 34.0 pg   MCHC 36.1 (*) 30.0 - 36.0 g/dL   RDW 64.4  03.4 - 74.2 %   Platelets 193  150 - 400 K/uL   Neutrophils Relative 88 (*) 43 - 77 %   Neutro Abs 18.2 (*) 1.7 - 7.7 K/uL   Lymphocytes Relative 6 (*) 12 - 46 %   Lymphs Abs 1.2  0.7 - 4.0 K/uL   Monocytes Relative 6  3 - 12 %   Monocytes Absolute 1.2 (*) 0.1 - 1.0 K/uL   Eosinophils Relative 0  0 - 5 %   Eosinophils Absolute 0.0  0.0 - 0.7 K/uL   Basophils Relative 0  0 - 1 %    Basophils Absolute 0.0  0.0 - 0.1 K/uL  BASIC METABOLIC PANEL     Status: Abnormal   Collection Time    06/23/12 11:41 PM      Result Value Range   Sodium 136  135 - 145 mEq/L   Potassium 4.0  3.5 - 5.1 mEq/L   Chloride 97  96 - 112 mEq/L   CO2 20  19 - 32 mEq/L   Glucose, Bld 144 (*) 70 - 99 mg/dL   BUN 19  6 - 23 mg/dL   Creatinine, Ser 5.95  0.50 - 1.35 mg/dL   Calcium 9.3  8.4 - 63.8 mg/dL   GFR calc non Af Amer >90  >90 mL/min   GFR calc Af Amer >90  >90 mL/min  TROPONIN I     Status: None   Collection Time    06/23/12 11:41 PM      Result Value Range   Troponin I <0.30  <0.30 ng/mL  BLOOD GAS, VENOUS     Status: Abnormal   Collection Time    06/24/12 12:20 AM      Result Value Range   O2 Content 3.0     Delivery systems NASAL CANNULA     pH, Ven 7.428 (*) 7.250 - 7.300   pCO2, Ven 31.4 (*) 45.0 - 50.0 mmHg   pO2, Ven 71.7 (*) 30.0 - 45.0 mmHg   Bicarbonate 20.3  20.0 - 24.0 mEq/L   TCO2 17.1  0 - 100 mmol/L   Acid-base deficit 2.3 (*) 0.0 - 2.0 mmol/L   O2 Saturation 94.2     Patient temperature 98.6     Collection site VEIN     Drawn by COLLECTED BY LABORATORY     Sample type VEIN    URINALYSIS, MICROSCOPIC ONLY     Status: Abnormal   Collection Time    06/24/12 12:23 AM      Result Value Range   Color, Urine AMBER (*) YELLOW   APPearance CLOUDY (*) CLEAR   Specific Gravity, Urine 1.029  1.005 - 1.030   pH 5.0  5.0 - 8.0   Glucose, UA NEGATIVE  NEGATIVE mg/dL   Hgb urine dipstick NEGATIVE  NEGATIVE   Bilirubin Urine SMALL (*) NEGATIVE   Ketones, ur NEGATIVE  NEGATIVE mg/dL   Protein, ur 30 (*)  NEGATIVE mg/dL   Urobilinogen, UA 2.0 (*) 0.0 - 1.0 mg/dL   Nitrite NEGATIVE  NEGATIVE   Leukocytes, UA MODERATE (*) NEGATIVE   WBC, UA 3-6  <3 WBC/hpf   Bacteria, UA RARE  RARE   Squamous Epithelial / LPF RARE  RARE   Urine-Other MUCOUS PRESENT      Imaging Studies: Dg Chest 1 View  06/24/2012  *RADIOLOGY REPORT*  Clinical Data: Status post fall; concern  for chest injury.  CHEST - 1 VIEW  Comparison: Chest radiograph performed 06/02/2011  Findings: The lungs are mildly hypoexpanded.  Mild vascular congestion is seen.  The lungs remain grossly clear.  There is no evidence of focal opacification, pleural effusion or pneumothorax.  The cardiomediastinal silhouette is within normal limits.  No acute osseous abnormalities are seen.  IMPRESSION: Mild vascular congestion seen; lungs hypoexpanded but grossly clear.  No displaced rib fractures identified.   Original Report Authenticated By: Tonia Ghent, M.D.    Dg Hip Complete Left  06/24/2012  *RADIOLOGY REPORT*  Clinical Data: Status post fall; left hip pain.  LEFT HIP - COMPLETE 2+ VIEW  Comparison: None.  Findings: There is a comminuted left femoral intertrochanteric fracture, though given comminution, this appears to involve an underlying basicervical left femoral neck fracture.  A displaced lesser trochanteric fragment is seen.  There is significant medial angulation; the left femoral head remains seated at the acetabulum.  No additional fractures are seen.  The right hip joint is grossly unremarkable in appearance.  No significant degenerative change is appreciated.  The sacroiliac joints are unremarkable in appearance.  The visualized bowel gas pattern is grossly unremarkable in appearance.  Scattered vascular calcifications are seen.  IMPRESSION:  1.  Comminuted left femoral intertrochanteric fracture; given comminution, this appears to involve an underlying basicervical left femoral neck fracture.  Displaced lesser trochanteric fragment seen, with significant medial angulation.  No evidence of dislocation. 2.  Scattered vascular calcifications seen.   Original Report Authenticated By: Tonia Ghent, M.D.    Dg Shoulder Left  06/24/2012  *RADIOLOGY REPORT*  Clinical Data: Status post fall; left shoulder pain.  LEFT SHOULDER - 2+ VIEW  Comparison: None.  Findings: There is a chronic fracture of the proximal  left humeral diaphysis, with significant surrounding healing reaction and bony remodelling, and an apparent pseudoarthrosis.  Mild medial angulation is noted.  The left humeral head remains seated at the glenoid fossa.  No additional fractures are seen.  The visualized portions of the left lung appear clear.  No significant soft tissue abnormalities are characterized on radiograph.  IMPRESSION: Chronic fracture of the proximal left humeral diaphysis, with significant surrounding healing reaction and bony remodelling, and apparent pseudarthrosis.  Mild medial angulation noted.   Original Report Authenticated By: Tonia Ghent, M.D.    12:54 AM No significant change in heart rate with 20 mg Cardizem IV. Rhythm appears to be sinus tachycardia. There has been some improvement with IV hydration.  EKG Interpretation:  Date & Time: 06/23/2012 11:37 AM  Rate: 154  Rhythm: sinus tachycardia  QRS Axis: right  Intervals: normal  ST/T Wave abnormalities: Q waves inferiorly  Conduction Disutrbances:none  Narrative Interpretation:   Old EKG Reviewed: Previously normal sinus rhythm, inferior Q waves not seen           Hanley Seamen, MD 06/24/12 0143  Hanley Seamen, MD 06/24/12 1610

## 2012-06-24 NOTE — Consult Note (Addendum)
Reason for Consult:  Comminuted left femoral intertrochanteric hip fracture Referring Physician:  ER Physician Consulted Physician:  Dr. Shela Leff is an 62 y.o. male.  HPI:   No family in room and patient a poor historian. Information gathered from previous notes. Steven [name] is a 62 y.o. male was brought from the nursing home after patient had a fall which was unwitnessed. Patient as per the history has not had any loss of consciousness. In the ER x-rays revealed comminuted left femoral intertrochanteric hip fracture. Patient does have chronic left humerus fracture. In the ER patient was found to be tachycardic at this time pain the medication and fluids were given. As per patient's sister with whom the medical Dr. Sherron Monday, patient also had a fall last week and was taken to the orthopedic doctor and at that time no acute fractures.  Patient is usually not ambulatory as per patient's sister. At this time patient does not complain of any chest pain or short of breath. Patient documented dementia and contributes little to the history. Dr. Charlann Boxer was consulted by the ER physician.  Past Medical History  Diagnosis Date  . Lumbar spondylitis   . Anxiety   . Urinary incontinence   . GERD (gastroesophageal reflux disease)   . Stroke   . Chronic pain   . Hypertonicity of bladder   . Fracture of left humerus     History reviewed. No pertinent past surgical history.  Family History  Problem Relation Age of Onset  . Diabetes type II Mother   . Hypertension Mother   . Diabetes type II Father   . Hypertension Father   . Dementia Father     Social History:  reports that he has never smoked. He does not have any smokeless tobacco history on file. He reports that he does not drink alcohol or use illicit drugs.  Allergies:  Allergies  Allergen Reactions  . Penicillins     REACTION: unknown allergy - hx since childhood    Results for orders placed during the hospital encounter  of 06/23/12 (from the past 48 hour(s))  CBC WITH DIFFERENTIAL     Status: Abnormal   Collection Time    06/23/12 11:41 PM      Result Value Range   WBC 20.7 (*) 4.0 - 10.5 K/uL   RBC 5.86 (*) 4.22 - 5.81 MIL/uL   Hemoglobin 17.2 (*) 13.0 - 17.0 g/dL   HCT 40.9  81.1 - 91.4 %   MCV 81.4  78.0 - 100.0 fL   MCH 29.4  26.0 - 34.0 pg   MCHC 36.1 (*) 30.0 - 36.0 g/dL   RDW 78.2  95.6 - 21.3 %   Platelets 193  150 - 400 K/uL   Neutrophils Relative 88 (*) 43 - 77 %   Neutro Abs 18.2 (*) 1.7 - 7.7 K/uL   Lymphocytes Relative 6 (*) 12 - 46 %   Lymphs Abs 1.2  0.7 - 4.0 K/uL   Monocytes Relative 6  3 - 12 %   Monocytes Absolute 1.2 (*) 0.1 - 1.0 K/uL   Eosinophils Relative 0  0 - 5 %   Eosinophils Absolute 0.0  0.0 - 0.7 K/uL   Basophils Relative 0  0 - 1 %   Basophils Absolute 0.0  0.0 - 0.1 K/uL  BASIC METABOLIC PANEL     Status: Abnormal   Collection Time    06/23/12 11:41 PM      Result Value  Range   Sodium 136  135 - 145 mEq/L   Potassium 4.0  3.5 - 5.1 mEq/L   Comment: SLIGHT HEMOLYSIS     HEMOLYSIS AT THIS LEVEL MAY AFFECT RESULT   Chloride 97  96 - 112 mEq/L   CO2 20  19 - 32 mEq/L   Glucose, Bld 144 (*) 70 - 99 mg/dL   BUN 19  6 - 23 mg/dL   Creatinine, Ser 0.34  0.50 - 1.35 mg/dL   Calcium 9.3  8.4 - 74.2 mg/dL   GFR calc non Af Amer >90  >90 mL/min   GFR calc Af Amer >90  >90 mL/min   Comment:            The eGFR has been calculated     using the CKD EPI equation.     This calculation has not been     validated in all clinical     situations.     eGFR's persistently     <90 mL/min signify     possible Chronic Kidney Disease.  TROPONIN I     Status: None   Collection Time    06/23/12 11:41 PM      Result Value Range   Troponin I <0.30  <0.30 ng/mL   Comment:            Due to the release kinetics of cTnI,     a negative result within the first hours     of the onset of symptoms does not rule out     myocardial infarction with certainty.     If myocardial  infarction is still suspected,     repeat the test at appropriate intervals.  BLOOD GAS, VENOUS     Status: Abnormal   Collection Time    06/24/12 12:20 AM      Result Value Range   O2 Content 3.0     Delivery systems NASAL CANNULA     pH, Ven 7.428 (*) 7.250 - 7.300   pCO2, Ven 31.4 (*) 45.0 - 50.0 mmHg   pO2, Ven 71.7 (*) 30.0 - 45.0 mmHg   Bicarbonate 20.3  20.0 - 24.0 mEq/L   TCO2 17.1  0 - 100 mmol/L   Acid-base deficit 2.3 (*) 0.0 - 2.0 mmol/L   O2 Saturation 94.2     Patient temperature 98.6     Collection site VEIN     Drawn by COLLECTED BY LABORATORY     Sample type VEIN    URINALYSIS, MICROSCOPIC ONLY     Status: Abnormal   Collection Time    06/24/12 12:23 AM      Result Value Range   Color, Urine AMBER (*) YELLOW   Comment: BIOCHEMICALS MAY BE AFFECTED BY COLOR   APPearance CLOUDY (*) CLEAR   Specific Gravity, Urine 1.029  1.005 - 1.030   pH 5.0  5.0 - 8.0   Glucose, UA NEGATIVE  NEGATIVE mg/dL   Hgb urine dipstick NEGATIVE  NEGATIVE   Bilirubin Urine SMALL (*) NEGATIVE   Ketones, ur NEGATIVE  NEGATIVE mg/dL   Protein, ur 30 (*) NEGATIVE mg/dL   Urobilinogen, UA 2.0 (*) 0.0 - 1.0 mg/dL   Nitrite NEGATIVE  NEGATIVE   Leukocytes, UA MODERATE (*) NEGATIVE   WBC, UA 3-6  <3 WBC/hpf   Bacteria, UA RARE  RARE   Squamous Epithelial / LPF RARE  RARE   Urine-Other MUCOUS PRESENT    TSH     Status: None  Collection Time    06/24/12  4:05 AM      Result Value Range   TSH 0.757  0.350 - 4.500 uIU/mL  T4, FREE     Status: None   Collection Time    06/24/12  4:05 AM      Result Value Range   Free T4 1.41  0.80 - 1.80 ng/dL  T3, FREE     Status: None   Collection Time    06/24/12  4:05 AM      Result Value Range   T3, Free 2.4  2.3 - 4.2 pg/mL  TROPONIN I     Status: None   Collection Time    06/24/12  4:05 AM      Result Value Range   Troponin I <0.30  <0.30 ng/mL   Comment:            Due to the release kinetics of cTnI,     a negative result within the  first hours     of the onset of symptoms does not rule out     myocardial infarction with certainty.     If myocardial infarction is still suspected,     repeat the test at appropriate intervals.  APTT     Status: None   Collection Time    06/24/12  4:05 AM      Result Value Range   aPTT 34  24 - 37 seconds  ALBUMIN     Status: Abnormal   Collection Time    06/24/12  4:05 AM      Result Value Range   Albumin 3.1 (*) 3.5 - 5.2 g/dL  TYPE AND SCREEN     Status: None   Collection Time    06/24/12  4:05 AM      Result Value Range   ABO/RH(D) A POS     Antibody Screen NEG     Sample Expiration 06/27/2012    PRO B NATRIURETIC PEPTIDE     Status: Abnormal   Collection Time    06/24/12  4:05 AM      Result Value Range   Pro B Natriuretic peptide (BNP) 1874.0 (*) 0 - 125 pg/mL  CBC     Status: Abnormal   Collection Time    06/24/12  4:05 AM      Result Value Range   WBC 17.0 (*) 4.0 - 10.5 K/uL   RBC 5.36  4.22 - 5.81 MIL/uL   Hemoglobin 15.6  13.0 - 17.0 g/dL   HCT 45.4  09.8 - 11.9 %   MCV 82.1  78.0 - 100.0 fL   MCH 29.1  26.0 - 34.0 pg   MCHC 35.5  30.0 - 36.0 g/dL   RDW 14.7  82.9 - 56.2 %   Platelets 147 (*) 150 - 400 K/uL   Comment: DELTA CHECK NOTED     REPEATED TO VERIFY     SPECIMEN CHECKED FOR CLOTS  BASIC METABOLIC PANEL     Status: Abnormal   Collection Time    06/24/12  4:05 AM      Result Value Range   Sodium 135  135 - 145 mEq/L   Potassium 3.7  3.5 - 5.1 mEq/L   Chloride 100  96 - 112 mEq/L   CO2 20  19 - 32 mEq/L   Glucose, Bld 129 (*) 70 - 99 mg/dL   BUN 17  6 - 23 mg/dL   Creatinine, Ser 1.30  0.50 - 1.35 mg/dL  Calcium 8.4  8.4 - 10.5 mg/dL   GFR calc non Af Amer >90  >90 mL/min   GFR calc Af Amer >90  >90 mL/min   Comment:            The eGFR has been calculated     using the CKD EPI equation.     This calculation has not been     validated in all clinical     situations.     eGFR's persistently     <90 mL/min signify     possible Chronic  Kidney Disease.  ABO/RH     Status: None   Collection Time    06/24/12  4:05 AM      Result Value Range   ABO/RH(D) A POS    MRSA PCR SCREENING     Status: None   Collection Time    06/24/12  4:52 AM      Result Value Range   MRSA by PCR NEGATIVE  NEGATIVE   Comment:            The GeneXpert MRSA Assay (FDA     approved for NASAL specimens     only), is one component of a     comprehensive MRSA colonization     surveillance program. It is not     intended to diagnose MRSA     infection nor to guide or     monitor treatment for     MRSA infections.  PRO B NATRIURETIC PEPTIDE     Status: Abnormal   Collection Time    06/24/12  5:51 AM      Result Value Range   Pro B Natriuretic peptide (BNP) 1830.0 (*) 0 - 125 pg/mL    Dg Chest 1 View  06/24/2012  *RADIOLOGY REPORT*  Clinical Data: Status post fall; concern for chest injury.  CHEST - 1 VIEW  Comparison: Chest radiograph performed 06/02/2011  Findings: The lungs are mildly hypoexpanded.  Mild vascular congestion is seen.  The lungs remain grossly clear.  There is no evidence of focal opacification, pleural effusion or pneumothorax.  The cardiomediastinal silhouette is within normal limits.  No acute osseous abnormalities are seen.  IMPRESSION: Mild vascular congestion seen; lungs hypoexpanded but grossly clear.  No displaced rib fractures identified.   Original Report Authenticated By: Tonia Ghent, M.D.    Dg Hip Complete Left  06/24/2012  *RADIOLOGY REPORT*  Clinical Data: Status post fall; left hip pain.  LEFT HIP - COMPLETE 2+ VIEW  Comparison: None.  Findings: There is a comminuted left femoral intertrochanteric fracture, though given comminution, this appears to involve an underlying basicervical left femoral neck fracture.  A displaced lesser trochanteric fragment is seen.  There is significant medial angulation; the left femoral head remains seated at the acetabulum.  No additional fractures are seen.  The right hip joint is  grossly unremarkable in appearance.  No significant degenerative change is appreciated.  The sacroiliac joints are unremarkable in appearance.  The visualized bowel gas pattern is grossly unremarkable in appearance.  Scattered vascular calcifications are seen.  IMPRESSION:  1.  Comminuted left femoral intertrochanteric fracture; given comminution, this appears to involve an underlying basicervical left femoral neck fracture.  Displaced lesser trochanteric fragment seen, with significant medial angulation.  No evidence of dislocation. 2.  Scattered vascular calcifications seen.   Original Report Authenticated By: Tonia Ghent, M.D.    Ct Head Wo Contrast  06/24/2012  *RADIOLOGY REPORT*  Clinical Data: Status post fall  out of bed.  Concern for head injury.  CT HEAD WITHOUT CONTRAST  Technique:  Contiguous axial images were obtained from the base of the skull through the vertex without contrast.  Comparison: CT of the head performed 07/16/2010  Findings: There is no evidence of acute infarction, mass lesion, or intra- or extra-axial hemorrhage on CT.  Prominence of the ventricles and sulci reflects moderate cortical volume loss.  Diffuse periventricular and subcortical white matter change reflects small vessel ischemic microangiopathy.  Chronic lacunar infarcts are seen within the basal ganglia bilaterally, and within the right cerebellar hemisphere.  Mild cerebellar atrophy is noted.  The brainstem and fourth ventricle are within normal limits.  The cerebral hemispheres demonstrate grossly normal gray-white differentiation.  No mass effect or midline shift is seen.  There is no evidence of fracture; visualized osseous structures are unremarkable in appearance.  The orbits are within normal limits. The paranasal sinuses and mastoid air cells are well-aerated.  No significant soft tissue abnormalities are seen.  IMPRESSION:  1.  No evidence of traumatic intracranial injury or fracture. 2.  Moderate cortical volume  loss and diffuse small vessel ischemic microangiopathy. 3.  Chronic lacunar infarcts within the basal ganglia bilaterally, and in the right cerebellar hemisphere.   Original Report Authenticated By: Tonia Ghent, M.D.    Ct Angio Chest Pe W/cm &/or Wo Cm  06/24/2012  *RADIOLOGY REPORT*  Clinical Data: Larey Seat out of bed.  Tachycardia.  Leukocytosis. Concern for pulmonary embolus.  CT ANGIOGRAPHY CHEST  Technique:  Multidetector CT imaging of the chest using the standard protocol during bolus administration of intravenous contrast. Multiplanar reconstructed images including MIPs were obtained and reviewed to evaluate the vascular anatomy.  Contrast: OMNIPAQUE IOHEXOL 350 MG/ML SOLN  Comparison: Chest radiograph performed 06/23/2012  Findings: There is no evidence of significant pulmonary embolus.  Patchy airspace opacities within both lower lung lobes most likely reflect atelectasis, though mild pneumonia could have a similar appearance.  No pleural effusion or pneumothorax is seen.  No masses are identified; no abnormal focal contrast enhancement is seen.  Diffuse coronary artery calcifications are seen.  The mediastinum is otherwise unremarkable in appearance.  No mediastinal lymphadenopathy is seen.  No pericardial effusion is identified. The great vessels are grossly unremarkable in appearance.  The esophagus is largely filled with air and fluid, raising question for esophageal dysmotility; an apparent small hiatal hernia is noted.  No axillary lymphadenopathy is seen.  The thyroid gland is unremarkable in appearance.  The visualized portions of the liver are unremarkable.  No acute osseous abnormalities are seen.  IMPRESSION:  1.  No evidence of significant pulmonary embolus. 2.  Patchy airspace opacities within both lower lung lobes most likely reflect atelectasis, though mild pneumonia could have a similar appearance. 3.  Diffuse coronary artery calcifications seen. 4.  Esophagus largely filled with air  and fluid, raising question for esophageal dysmotility.  Apparent small hiatal hernia seen.   Original Report Authenticated By: Tonia Ghent, M.D.    Dg Shoulder Left  06/24/2012  *RADIOLOGY REPORT*  Clinical Data: Status post fall; left shoulder pain.  LEFT SHOULDER - 2+ VIEW  Comparison: None.  Findings: There is a chronic fracture of the proximal left humeral diaphysis, with significant surrounding healing reaction and bony remodelling, and an apparent pseudoarthrosis.  Mild medial angulation is noted.  The left humeral head remains seated at the glenoid fossa.  No additional fractures are seen.  The visualized portions of the left lung appear clear.  No significant soft tissue abnormalities are characterized on radiograph.  IMPRESSION: Chronic fracture of the proximal left humeral diaphysis, with significant surrounding healing reaction and bony remodelling, and apparent pseudarthrosis.  Mild medial angulation noted.   Original Report Authenticated By: Tonia Ghent, M.D.     Review of Systems  Unable to perform ROS: dementia  Musculoskeletal: Positive for joint pain (left hip pain with motion, however the patient does have the leg pulled up under him. Also left arm pain from chronic humerus fracture.).   Blood pressure 136/92, pulse 135, temperature 97.7 F (36.5 C), temperature source Oral, resp. rate 20, height 5' 1.81" (1.57 m), weight 64.411 kg (142 lb), SpO2 96.00%. Physical Exam  Constitutional: He appears well-developed.  HENT:  Head: Normocephalic.  Eyes: Pupils are equal, round, and reactive to light.  Neck: Neck supple. No JVD present. No tracheal deviation present. No thyromegaly present.  Cardiovascular: Intact distal pulses.  Tachycardia present.   Respiratory: Effort normal and breath sounds normal. No respiratory distress. He has no wheezes.  GI: Soft. There is no tenderness. There is no guarding.  Musculoskeletal:       Left shoulder: He exhibits decreased range of motion,  tenderness, bony tenderness, deformity and decreased strength. He exhibits no effusion.       Left hip: He exhibits decreased range of motion, decreased strength, tenderness, bony tenderness and deformity. He exhibits no crepitus and no laceration.  Neurological: He is alert.  Skin: Skin is warm, dry and intact.  Psychiatric: His affect is not angry.  Appears to have confusion and inability to answer some questions. Reported to have dementia.     Assessment: Comminuted left femoral intertrochanteric hip fracture   Plan: Awaiting clearance for surgery Will discuss case with family Will allow the patient to eat today, but make NPO after midnight in anticipation for surgery tomorrow. Plan is for a ORIF of the left hip fracture on Fri 06/25/2012, if consent obtained and he gets clearance.    Gerrit Halls 06/24/2012, 11:57 AM     Patient has been seen and examined.  Agree with above assessment and plan. Consent signed by his sister I spoke to his sister about the medical necessity of this procedure and she wishes for it to be performed in his best interest  Spoke to her  About post-operative course and expectations Patient will need to SNF

## 2012-06-24 NOTE — Progress Notes (Signed)
ANTIBIOTIC CONSULT NOTE - INITIAL  Pharmacy Consult for Vancomycin, Levofloxacin, Aztreonam Indication: rule out Health-care Acquired Pneumonia  Allergies  Allergen Reactions  . Penicillins     REACTION: unknown allergy - hx since childhood    Patient Measurements: Height: 5' 1.81" (157 cm) Weight: 142 lb (64.411 kg) IBW/kg (Calculated) : 54.17   Vital Signs: Temp: 97.7 F (36.5 C) (04/17 0448) Temp src: Oral (04/17 0448) BP: 136/92 mmHg (04/17 0448) Pulse Rate: 135 (04/17 0448) Intake/Output from previous day: 04/16 0701 - 04/17 0700 In: 2000 [I.V.:2000] Out: 600 [Urine:600] Intake/Output from this shift:    Labs:  Recent Labs  06/23/12 2341 06/24/12 0405  WBC 20.7* 17.0*  HGB 17.2* 15.6  PLT 193 147*  CREATININE 0.74 0.66   Estimated Creatinine Clearance: 74.3 ml/min (by C-G formula based on Cr of 0.66). No results found for this basename: VANCOTROUGH, Leodis Binet, VANCORANDOM, GENTTROUGH, GENTPEAK, GENTRANDOM, TOBRATROUGH, TOBRAPEAK, TOBRARND, AMIKACINPEAK, AMIKACINTROU, AMIKACIN,  in the last 72 hours   Microbiology: Recent Results (from the past 720 hour(s))  MRSA PCR SCREENING     Status: None   Collection Time    06/24/12  4:52 AM      Result Value Range Status   MRSA by PCR NEGATIVE  NEGATIVE Final   Comment:            The GeneXpert MRSA Assay (FDA     approved for NASAL specimens     only), is one component of a     comprehensive MRSA colonization     surveillance program. It is not     intended to diagnose MRSA     infection nor to guide or     monitor treatment for     MRSA infections.    Medical History: Past Medical History  Diagnosis Date  . Lumbar spondylitis   . Anxiety   . Urinary incontinence   . GERD (gastroesophageal reflux disease)   . Stroke   . Chronic pain   . Hypertonicity of bladder   . Fracture of left humerus     Medications:  Prescriptions prior to admission  Medication Sig Dispense Refill  . cholecalciferol  (VITAMIN D) 1000 UNITS tablet Take 1,000 Units by mouth every morning.       . clopidogrel (PLAVIX) 75 MG tablet Take 75 mg by mouth every morning.       . Multiple Vitamin (MULITIVITAMIN WITH MINERALS) TABS Take 1 tablet by mouth every morning.       Marland Kitchen omeprazole (PRILOSEC) 20 MG capsule Take 40 mg by mouth every morning.       . tamsulosin (FLOMAX) 0.4 MG CAPS Take 0.4 mg by mouth at bedtime.       Scheduled:  . [COMPLETED] aztreonam  2 g Intravenous Once  . [COMPLETED] diltiazem  20 mg Intravenous Once  . [COMPLETED] fentaNYL  100 mcg Intravenous Once  . [COMPLETED] fentaNYL  50 mcg Intravenous Once  . [COMPLETED] levofloxacin (LEVAQUIN) IV  750 mg Intravenous Once  . pantoprazole  40 mg Oral Daily  . [COMPLETED] sodium chloride  1,000 mL Intravenous Once  . [COMPLETED] sodium chloride  1,000 mL Intravenous Once  . tamsulosin  0.4 mg Oral QHS  . [COMPLETED] vancomycin  1,000 mg Intravenous Once  . [DISCONTINUED] sodium chloride   Intravenous Once   Assessment: 62 y.o. male s/p unwitnessed fall at assisted living facility. X-ray reveals left hip fracture. CT of chest shows possible pneumonia. Pharmacy consulted to assist with ABX for  suspected HCAP.   Blood cultures pending, MRSA by PCR negative   SCr WNL, CrCl ~74 ml/min CG  Vancomycin 1g, levofloxacin 750mg , aztreonam 2g given x 1 dose pending patient's weight  Goal of Therapy:  Vancomycin trough level 15-20 mcg/ml  Plan:   Decrease Vancomycin dose from 1g to 750mg  IV q12h   Continue Levofloxacin 750mg  IV q24h  Decrease Aztreonam dose from 2g to 1g IV q8h  F/U with cultures, SCr, VT levels at steady state  Beatrix Shipper 06/24/2012,9:29 AM

## 2012-06-24 NOTE — H&P (Addendum)
[facility] History and Physical  Steven [name] NFA:[id number] DOB: 11-May-1950 DOA: 06/23/2012  Referring physician: Dr.Molpus. PCP: No primary provider on file.   Chief Complaint: Fall with left hip pain.  HPI: Steven [name] is a 62 y.o. male was brought from the nursing home after patient had a fall which was unwitnessed. Patient as per the history has not had any loss of consciousness. In the ER x-rays revealed left hip fracture. Patient does have chronic left humerus fracture. In the ER patient was found to be tachycardic at this time pain the medication and fluids were given. As per patient's sister with whom I spoke patient also had a fall last week and was taken to the orthopedic doctor and at that time no acute fractures were found except for the chronic left humerus fracture. Patient is usually is not ambulatory as per patient's sister. At this time patient does not complain of any chest pain or short of breath. Patient probably has dementia and contributes very less to history. Orthopedic surgeon on call was consulted by the ER physician.  Review of Systems: As presented in the history of presenting illness, rest negative.  Past Medical History  Diagnosis Date  . Lumbar spondylitis   . Anxiety   . Urinary incontinence   . GERD (gastroesophageal reflux disease)   . Stroke   . Chronic pain   . Hypertonicity of bladder   . Fracture of left humerus    History reviewed. No pertinent past surgical history. Social History:  reports that he has never smoked. He does not have any smokeless tobacco history on file. He reports that he does not drink alcohol or use illicit drugs. Lives at nursing home. where does patient live-- Cannot do ADLs. Can patient participate in ADLs?  Allergies  Allergen Reactions  . Penicillins     REACTION: unknown allergy - hx since childhood    Family History  Problem Relation Age of Onset  . Diabetes type II Mother   . Hypertension Mother    . Diabetes type II Father   . Hypertension Father   . Dementia Father       Prior to Admission medications   Medication Sig Start Date End Date Taking? Authorizing Provider  cholecalciferol (VITAMIN D) 1000 UNITS tablet Take 1,000 Units by mouth every morning.    Yes Historical Provider, MD  clopidogrel (PLAVIX) 75 MG tablet Take 75 mg by mouth every morning.    Yes Historical Provider, MD  Multiple Vitamin (MULITIVITAMIN WITH MINERALS) TABS Take 1 tablet by mouth every morning.    Yes Historical Provider, MD  omeprazole (PRILOSEC) 20 MG capsule Take 40 mg by mouth every morning.    Yes Historical Provider, MD  tamsulosin (FLOMAX) 0.4 MG CAPS Take 0.4 mg by mouth at bedtime.   Yes Historical Provider, MD   Physical Exam: Filed Vitals:   06/23/12 2340 06/24/12 0031 06/24/12 0100 06/24/12 0130  BP:  125/79 131/78 130/82  Pulse:  128 124 125  Temp:      TempSrc:      Resp: 41 31 27 30   SpO2:  99% 99% 98%     General:  Well-developed well-nourished.  Eyes: Anicteric no pallor.  ENT: No discharge from the ears eyes nose and mouth.  Neck: No mass felt.  Cardiovascular: S1-S2 heard tachycardic.  Respiratory: No rhonchi or crepitations.  Abdomen: Soft nontender bowel sounds present.  Skin: No rash seen.  Musculoskeletal: Pain on moving left shoulder and left  leg.  Psychiatric: Patient looks demented. Oriented to his name only.  Neurologic: Has left-sided weakness from old stroke.  Labs on Admission:  Basic Metabolic Panel:  Recent Labs Lab 06/23/12 2341  NA 136  K 4.0  CL 97  CO2 20  GLUCOSE 144*  BUN 19  CREATININE 0.74  CALCIUM 9.3   Liver Function Tests: No results found for this basename: AST, ALT, ALKPHOS, BILITOT, PROT, ALBUMIN,  in the last 168 hours No results found for this basename: LIPASE, AMYLASE,  in the last 168 hours No results found for this basename: AMMONIA,  in the last 168 hours CBC:  Recent Labs Lab 06/23/12 2341  WBC 20.7*   NEUTROABS 18.2*  HGB 17.2*  HCT 47.7  MCV 81.4  PLT 193   Cardiac Enzymes:  Recent Labs Lab 06/23/12 2341  TROPONINI <0.30    BNP (last 3 results) No results found for this basename: PROBNP,  in the last 8760 hours CBG: No results found for this basename: GLUCAP,  in the last 168 hours  Radiological Exams on Admission: Dg Chest 1 View  06/24/2012  *RADIOLOGY REPORT*  Clinical Data: Status post fall; concern for chest injury.  CHEST - 1 VIEW  Comparison: Chest radiograph performed 06/02/2011  Findings: The lungs are mildly hypoexpanded.  Mild vascular congestion is seen.  The lungs remain grossly clear.  There is no evidence of focal opacification, pleural effusion or pneumothorax.  The cardiomediastinal silhouette is within normal limits.  No acute osseous abnormalities are seen.  IMPRESSION: Mild vascular congestion seen; lungs hypoexpanded but grossly clear.  No displaced rib fractures identified.   Original Report Authenticated By: Tonia Ghent, M.D.    Dg Hip Complete Left  06/24/2012  *RADIOLOGY REPORT*  Clinical Data: Status post fall; left hip pain.  LEFT HIP - COMPLETE 2+ VIEW  Comparison: None.  Findings: There is a comminuted left femoral intertrochanteric fracture, though given comminution, this appears to involve an underlying basicervical left femoral neck fracture.  A displaced lesser trochanteric fragment is seen.  There is significant medial angulation; the left femoral head remains seated at the acetabulum.  No additional fractures are seen.  The right hip joint is grossly unremarkable in appearance.  No significant degenerative change is appreciated.  The sacroiliac joints are unremarkable in appearance.  The visualized bowel gas pattern is grossly unremarkable in appearance.  Scattered vascular calcifications are seen.  IMPRESSION:  1.  Comminuted left femoral intertrochanteric fracture; given comminution, this appears to involve an underlying basicervical left femoral  neck fracture.  Displaced lesser trochanteric fragment seen, with significant medial angulation.  No evidence of dislocation. 2.  Scattered vascular calcifications seen.   Original Report Authenticated By: Tonia Ghent, M.D.    Dg Shoulder Left  06/24/2012  *RADIOLOGY REPORT*  Clinical Data: Status post fall; left shoulder pain.  LEFT SHOULDER - 2+ VIEW  Comparison: None.  Findings: There is a chronic fracture of the proximal left humeral diaphysis, with significant surrounding healing reaction and bony remodelling, and an apparent pseudoarthrosis.  Mild medial angulation is noted.  The left humeral head remains seated at the glenoid fossa.  No additional fractures are seen.  The visualized portions of the left lung appear clear.  No significant soft tissue abnormalities are characterized on radiograph.  IMPRESSION: Chronic fracture of the proximal left humeral diaphysis, with significant surrounding healing reaction and bony remodelling, and apparent pseudarthrosis.  Mild medial angulation noted.   Original Report Authenticated By: Tonia Ghent, M.D.  EKG: Independently reviewed. Sinus tachycardia.  Assessment/Plan Principal Problem:   Closed left hip fracture Active Problems:   CVA   Dementia   Fracture of left humerus   Leucocytosis   1. Left femoral intertrochanteric fracture - at this time patient will be kept n.p.o. in anticipation of possible surgery. Orthopedic surgery to see. Patient does have significant tachycardia and if this continues may have to get cardiac clearance before surgery. See #2. 2. Sinus tachycardia - not sure what's causing it. May be pain related or due to dehydration. Continue with hydration. Patient was given monitored normal saline bolus in the ER. At this time I had CT angiogram of the chest to rule out PE which is pending. Check thyroid function tests and troponins and BNP. As discussed in #1 if tachycardia persist may need cardiac clearance. 3. History of  CVA with left-sided hemiparesis - presently holding of Plavix for possible surgery of the left hip. 4. Dementia - no acute issues. 5. Chronic left humerus fracture. 6. Leukocytosis - patient is afebrile. Chest x-ray and UA are unremarkable except the chest x-ray showing mild congestion. Closely follow CBC with differential. Follow CT angiogram of the chest.  CT of the head is pending.  Code Status: Full code as discussed with patient's sister.  Family Communication: Patient's sister.  Disposition Plan: Admit to inpatient.    Steven [name] N. [facility] Pager [phone].  If 7PM-7AM, please contact night-coverage [email or web address] Password [email or web address] 06/24/2012, 2:19 AM Addendum - CT chest was negative for any PE but was showing possibility of pneumonic process. Given patient's leukocytosis at this time I have started patient on empiric antibiotics to cover for health care associated pneumonia and also ordered blood cultures.  Midge Minium

## 2012-06-24 NOTE — Progress Notes (Signed)
[facility] PROGRESS NOTE  Steven [name] RUE:[id number] DOB: [birth year] DOA: 06/23/2012 PCP: No primary provider on file.  Assessment/Plan:                                                                                                                1. Left femoral intertrochanteric fracture -  Orthopedic surgery to see. Cardiology called for pre op clearance. 2.  Sinus tachycardia - not sure what's causing it. May be pain related or due to dehydration. Continue with hydration. Patient was given monitored normal saline bolus in the ER. At this time I had CT angiogram of the chest to rule out PE which was negative. Free t4 and TSH within normal limits.  3. History of CVA with left-sided hemiparesis - presently holding of Plavix for possible surgery of the left hip. 4. Dementia - no acute issues. 5. Chronic left humerus fracture. 6. Leukocytosis - patient is afebrile. Chest x-ray and UA are unremarkable except the chest x-ray showing mild congestion. Closely follow CBC with differential.  7. DVT prophylaxis.  Code Status: full code Family Communication: none at bedside Disposition Plan: pending PT eval.    Consultants:  Cardiology  orthopedics  HPI/Subjective: Pain is controlled.   Objective: Filed Vitals:   06/24/12 0800 06/24/12 0837 06/24/12 1200 06/24/12 1318  BP:    110/79  Pulse:    147  Temp:    98.2 F (36.8 C)  TempSrc:    Oral  Resp: 20  18 18   Height:  5' 1.81" (1.57 m)    Weight:  64.411 kg (142 lb)    SpO2: 96%  96% 94%    Intake/Output Summary (Last 24 hours) at 06/24/12 1455 Last data filed at 06/24/12 1300  Gross per 24 hour  Intake   2000 ml  Output    900 ml  Net   1100 ml   Filed Weights   06/24/12 0837  Weight: 64.411 kg (142 lb)    Exam:  General: Well-developed well-nourished.   Cardiovascular: S1-S2 heard tachycardic.  Respiratory: No rhonchi or crepitations. A bdomen: Soft nontender bowel sounds present.   Musculoskeletal: Pain  on moving left shoulder and left leg.  Marland Kitchen Neurologic: Has left-sided weakness from old stroke  Data Reviewed: Basic Metabolic Panel:  Recent Labs Lab 06/23/12 2341 06/24/12 0405  NA 136 135  K 4.0 3.7  CL 97 100  CO2 20 20  GLUCOSE 144* 129*  BUN 19 17  CREATININE 0.74 0.66  CALCIUM 9.3 8.4   Liver Function Tests:  Recent Labs Lab 06/24/12 0405  ALBUMIN 3.1*   No results found for this basename: LIPASE, AMYLASE,  in the last 168 hours No results found for this basename: AMMONIA,  in the last 168 hours CBC:  Recent Labs Lab 06/23/12 2341 06/24/12 0405  WBC 20.7* 17.0*  NEUTROABS 18.2*  --   HGB 17.2* 15.6  HCT 47.7 44.0  MCV 81.4 82.1  PLT 193 147*   Cardiac Enzymes:  Recent Labs Lab 06/23/12 2341  06/24/12 0405  TROPONINI <0.30 <0.30   BNP (last 3 results)  Recent Labs  06/24/12 0405 06/24/12 0551  PROBNP 1874.0* 1830.0*   CBG: No results found for this basename: GLUCAP,  in the last 168 hours  Recent Results (from the past 240 hour(s))  MRSA PCR SCREENING     Status: None   Collection Time    06/24/12  4:52 AM      Result Value Range Status   MRSA by PCR NEGATIVE  NEGATIVE Final   Comment:            The GeneXpert MRSA Assay (FDA     approved for NASAL specimens     only), is one component of a     comprehensive MRSA colonization     surveillance program. It is not     intended to diagnose MRSA     infection nor to guide or     monitor treatment for     MRSA infections.     Studies: Dg Chest 1 View  06/24/2012  *RADIOLOGY REPORT*  Clinical Data: Status post fall; concern for chest injury.  CHEST - 1 VIEW  Comparison: Chest radiograph performed 06/02/2011  Findings: The lungs are mildly hypoexpanded.  Mild vascular congestion is seen.  The lungs remain grossly clear.  There is no evidence of focal opacification, pleural effusion or pneumothorax.  The cardiomediastinal silhouette is within normal limits.  No acute osseous abnormalities are  seen.  IMPRESSION: Mild vascular congestion seen; lungs hypoexpanded but grossly clear.  No displaced rib fractures identified.   Original Report Authenticated By: Tonia Ghent, M.D.    Dg Hip Complete Left  06/24/2012  *RADIOLOGY REPORT*  Clinical Data: Status post fall; left hip pain.  LEFT HIP - COMPLETE 2+ VIEW  Comparison: None.  Findings: There is a comminuted left femoral intertrochanteric fracture, though given comminution, this appears to involve an underlying basicervical left femoral neck fracture.  A displaced lesser trochanteric fragment is seen.  There is significant medial angulation; the left femoral head remains seated at the acetabulum.  No additional fractures are seen.  The right hip joint is grossly unremarkable in appearance.  No significant degenerative change is appreciated.  The sacroiliac joints are unremarkable in appearance.  The visualized bowel gas pattern is grossly unremarkable in appearance.  Scattered vascular calcifications are seen.  IMPRESSION:  1.  Comminuted left femoral intertrochanteric fracture; given comminution, this appears to involve an underlying basicervical left femoral neck fracture.  Displaced lesser trochanteric fragment seen, with significant medial angulation.  No evidence of dislocation. 2.  Scattered vascular calcifications seen.   Original Report Authenticated By: Tonia Ghent, M.D.    Ct Head Wo Contrast  06/24/2012  *RADIOLOGY REPORT*  Clinical Data: Status post fall out of bed.  Concern for head injury.  CT HEAD WITHOUT CONTRAST  Technique:  Contiguous axial images were obtained from the base of the skull through the vertex without contrast.  Comparison: CT of the head performed 07/16/2010  Findings: There is no evidence of acute infarction, mass lesion, or intra- or extra-axial hemorrhage on CT.  Prominence of the ventricles and sulci reflects moderate cortical volume loss.  Diffuse periventricular and subcortical white matter change reflects small  vessel ischemic microangiopathy.  Chronic lacunar infarcts are seen within the basal ganglia bilaterally, and within the right cerebellar hemisphere.  Mild cerebellar atrophy is noted.  The brainstem and fourth ventricle are within normal limits.  The cerebral hemispheres demonstrate grossly normal gray-white  differentiation.  No mass effect or midline shift is seen.  There is no evidence of fracture; visualized osseous structures are unremarkable in appearance.  The orbits are within normal limits. The paranasal sinuses and mastoid air cells are well-aerated.  No significant soft tissue abnormalities are seen.  IMPRESSION:  1.  No evidence of traumatic intracranial injury or fracture. 2.  Moderate cortical volume loss and diffuse small vessel ischemic microangiopathy. 3.  Chronic lacunar infarcts within the basal ganglia bilaterally, and in the right cerebellar hemisphere.   Original Report Authenticated By: Tonia Ghent, M.D.    Ct Angio Chest Pe W/cm &/or Wo Cm  06/24/2012  *RADIOLOGY REPORT*  Clinical Data: Larey Seat out of bed.  Tachycardia.  Leukocytosis. Concern for pulmonary embolus.  CT ANGIOGRAPHY CHEST  Technique:  Multidetector CT imaging of the chest using the standard protocol during bolus administration of intravenous contrast. Multiplanar reconstructed images including MIPs were obtained and reviewed to evaluate the vascular anatomy.  Contrast: OMNIPAQUE IOHEXOL 350 MG/ML SOLN  Comparison: Chest radiograph performed 06/23/2012  Findings: There is no evidence of significant pulmonary embolus.  Patchy airspace opacities within both lower lung lobes most likely reflect atelectasis, though mild pneumonia could have a similar appearance.  No pleural effusion or pneumothorax is seen.  No masses are identified; no abnormal focal contrast enhancement is seen.  Diffuse coronary artery calcifications are seen.  The mediastinum is otherwise unremarkable in appearance.  No mediastinal lymphadenopathy is  seen.  No pericardial effusion is identified. The great vessels are grossly unremarkable in appearance.  The esophagus is largely filled with air and fluid, raising question for esophageal dysmotility; an apparent small hiatal hernia is noted.  No axillary lymphadenopathy is seen.  The thyroid gland is unremarkable in appearance.  The visualized portions of the liver are unremarkable.  No acute osseous abnormalities are seen.  IMPRESSION:  1.  No evidence of significant pulmonary embolus. 2.  Patchy airspace opacities within both lower lung lobes most likely reflect atelectasis, though mild pneumonia could have a similar appearance. 3.  Diffuse coronary artery calcifications seen. 4.  Esophagus largely filled with air and fluid, raising question for esophageal dysmotility.  Apparent small hiatal hernia seen.   Original Report Authenticated By: Tonia Ghent, M.D.    Dg Shoulder Left  06/24/2012  *RADIOLOGY REPORT*  Clinical Data: Status post fall; left shoulder pain.  LEFT SHOULDER - 2+ VIEW  Comparison: None.  Findings: There is a chronic fracture of the proximal left humeral diaphysis, with significant surrounding healing reaction and bony remodelling, and an apparent pseudoarthrosis.  Mild medial angulation is noted.  The left humeral head remains seated at the glenoid fossa.  No additional fractures are seen.  The visualized portions of the left lung appear clear.  No significant soft tissue abnormalities are characterized on radiograph.  IMPRESSION: Chronic fracture of the proximal left humeral diaphysis, with significant surrounding healing reaction and bony remodelling, and apparent pseudarthrosis.  Mild medial angulation noted.   Original Report Authenticated By: Tonia Ghent, M.D.     Scheduled Meds: . aztreonam  1 g Intravenous Q8H  . [START ON 06/25/2012] levofloxacin (LEVAQUIN) IV  750 mg Intravenous Q24H  . pantoprazole  40 mg Oral Daily  . tamsulosin  0.4 mg Oral QHS  . vancomycin  750 mg  Intravenous Q12H   Continuous Infusions: . sodium chloride 100 mL/hr at 06/24/12 0447    Principal Problem:   Closed left hip fracture Active Problems:   CVA  Dementia   Fracture of left humerus   Leucocytosis    Time spent: 30 min    Kea Callan  [facility] Pager [phone] If 7PM-7AM, please contact night-coverage at [email or web address], password [facility] 06/24/2012, 2:55 PM  LOS: 1 day

## 2012-06-24 NOTE — ED Notes (Signed)
Pts belongings, pajama pants, socks, and t shirt along with a stretch band quartz watch placed in a belongings bag.

## 2012-06-24 NOTE — Progress Notes (Signed)
Hessie Knows, PharmD, BCPS Pager [phone] 06/24/2012 10:12 AM

## 2012-06-24 NOTE — ED Notes (Signed)
Admitting MD at bedside.

## 2012-06-24 NOTE — Consult Note (Signed)
Admit date: 06/23/2012 Referring Physician  Blake Divine, MD Primary Physician  Unknown Primary Cardiologist  None Reason for Consultation  Cardiac clearance for Left hip fracture  ASSESSMENT: 1. Left hip fracture requiring surgery 2. Pre-operative cardiac assessment - has underlying cardiac disease based on data, with no way to judge stability. 3. CAD with prior Inferior MI ? Age 62. Heart failure, possibly systolic, given prior MI on ECG and elevated BNP 5. Sinus tachycardia without explanation 6. Dementia  PLAN: 1. 2 D echocardiogram 2. Cycle cardiac markers 3. Diuresis as tolerated by BP and renal function 4. Low dose beta blocker therapy 5. Clearance once questions answered about cause of tachycardia and LV function.   HPI:  Un-witnessed fall at nursing home. Found to have left hip fracture which needs surgery. We are consulted for cardiac clearance due to tachycardia and elevated troponin. No specific current cardiac complaints but patient is not very communicative.   PMH:   Past Medical History  Diagnosis Date  . Lumbar spondylitis   . Anxiety   . Urinary incontinence   . GERD (gastroesophageal reflux disease)   . Stroke   . Chronic pain   . Hypertonicity of bladder   . Fracture of left humerus      PSH:  History reviewed. No pertinent past surgical history.  Allergies:  Penicillins Prior to Admit Meds:   Prescriptions prior to admission  Medication Sig Dispense Refill  . cholecalciferol (VITAMIN D) 1000 UNITS tablet Take 1,000 Units by mouth every morning.       . clopidogrel (PLAVIX) 75 MG tablet Take 75 mg by mouth every morning.       . Multiple Vitamin (MULITIVITAMIN WITH MINERALS) TABS Take 1 tablet by mouth every morning.       Marland Kitchen omeprazole (PRILOSEC) 20 MG capsule Take 40 mg by mouth every morning.       . tamsulosin (FLOMAX) 0.4 MG CAPS Take 0.4 mg by mouth at bedtime.       Fam HX:    Family History  Problem Relation Age of Onset  . Diabetes type II  Mother   . Hypertension Mother   . Diabetes type II Father   . Hypertension Father   . Dementia Father    Social HX:    History   Social History  . Marital Status: Single    Spouse Name: N/A    Number of Children: N/A  . Years of Education: N/A   Occupational History  . Not on file.   Social History Main Topics  . Smoking status: Never Smoker   . Smokeless tobacco: Not on file  . Alcohol Use: No     Comment: 6pack a week  . Drug Use: No  . Sexually Active: Not on file   Other Topics Concern  . Not on file   Social History Narrative  . No narrative on file     Review of Systems: Not able to obtain Short yes and no answers that contradict within seconds if asked twice.  Physical Exam: Blood pressure 110/79, pulse 147, temperature 98.2 F (36.8 C), temperature source Oral, resp. rate 18, height 5' 1.81" (1.57 m), weight 64.411 kg (142 lb), SpO2 94.00%. Weight change:    Awake and in no distress.  Neck veins are not visible.  Chest is clear to auscultation and percussion  Cardiac exam reveals tachycardia and no gallop or murmur.  Extremities reveal left sided weakness and partial atrophy in left arm and leg.  Neuro reveals gaze disturbance, left sided rigidity, and communication disturbance. Labs:   Lab Results  Component Value Date   WBC 17.0* 06/24/2012   HGB 15.6 06/24/2012   HCT 44.0 06/24/2012   MCV 82.1 06/24/2012   PLT 147* 06/24/2012    Recent Labs Lab 06/24/12 0405  NA 135  K 3.7  CL 100  CO2 20  BUN 17  CREATININE 0.66  CALCIUM 8.4  GLUCOSE 129*   No results found for this basename: PTT   No results found for this basename: INR, PROTIME   Lab Results  Component Value Date   TROPONINI <0.30 06/24/2012     Lab Results  Component Value Date   CHOL 195 01/08/2010   Lab Results  Component Value Date   HDL 51 01/08/2010   Lab Results  Component Value Date   LDLCALC 126* 01/08/2010   Lab Results  Component Value Date   TRIG 91  01/08/2010   Lab Results  Component Value Date   CHOLHDL 3.8 Ratio 01/08/2010   No results found for this basename: LDLDIRECT      Radiology:  Dg Chest 1 View  06/24/2012  *RADIOLOGY REPORT*  Clinical Data: Status post fall; concern for chest injury.  CHEST - 1 VIEW  Comparison: Chest radiograph performed 06/02/2011  Findings: The lungs are mildly hypoexpanded.  Mild vascular congestion is seen.  The lungs remain grossly clear.  There is no evidence of focal opacification, pleural effusion or pneumothorax.  The cardiomediastinal silhouette is within normal limits.  No acute osseous abnormalities are seen.  IMPRESSION: Mild vascular congestion seen; lungs hypoexpanded but grossly clear.  No displaced rib fractures identified.   Original Report Authenticated By: Tonia Ghent, M.D.    Dg Hip Complete Left  06/24/2012  *RADIOLOGY REPORT*  Clinical Data: Status post fall; left hip pain.  LEFT HIP - COMPLETE 2+ VIEW  Comparison: None.  Findings: There is a comminuted left femoral intertrochanteric fracture, though given comminution, this appears to involve an underlying basicervical left femoral neck fracture.  A displaced lesser trochanteric fragment is seen.  There is significant medial angulation; the left femoral head remains seated at the acetabulum.  No additional fractures are seen.  The right hip joint is grossly unremarkable in appearance.  No significant degenerative change is appreciated.  The sacroiliac joints are unremarkable in appearance.  The visualized bowel gas pattern is grossly unremarkable in appearance.  Scattered vascular calcifications are seen.  IMPRESSION:  1.  Comminuted left femoral intertrochanteric fracture; given comminution, this appears to involve an underlying basicervical left femoral neck fracture.  Displaced lesser trochanteric fragment seen, with significant medial angulation.  No evidence of dislocation. 2.  Scattered vascular calcifications seen.   Original Report  Authenticated By: Tonia Ghent, M.D.    Ct Head Wo Contrast  06/24/2012  *RADIOLOGY REPORT*  Clinical Data: Status post fall out of bed.  Concern for head injury.  CT HEAD WITHOUT CONTRAST  Technique:  Contiguous axial images were obtained from the base of the skull through the vertex without contrast.  Comparison: CT of the head performed 07/16/2010  Findings: There is no evidence of acute infarction, mass lesion, or intra- or extra-axial hemorrhage on CT.  Prominence of the ventricles and sulci reflects moderate cortical volume loss.  Diffuse periventricular and subcortical white matter change reflects small vessel ischemic microangiopathy.  Chronic lacunar infarcts are seen within the basal ganglia bilaterally, and within the right cerebellar hemisphere.  Mild cerebellar atrophy is noted.  The brainstem and fourth ventricle are within normal limits.  The cerebral hemispheres demonstrate grossly normal gray-white differentiation.  No mass effect or midline shift is seen.  There is no evidence of fracture; visualized osseous structures are unremarkable in appearance.  The orbits are within normal limits. The paranasal sinuses and mastoid air cells are well-aerated.  No significant soft tissue abnormalities are seen.  IMPRESSION:  1.  No evidence of traumatic intracranial injury or fracture. 2.  Moderate cortical volume loss and diffuse small vessel ischemic microangiopathy. 3.  Chronic lacunar infarcts within the basal ganglia bilaterally, and in the right cerebellar hemisphere.   Original Report Authenticated By: Tonia Ghent, M.D.    Ct Angio Chest Pe W/cm &/or Wo Cm  06/24/2012  *RADIOLOGY REPORT*  Clinical Data: Larey Seat out of bed.  Tachycardia.  Leukocytosis. Concern for pulmonary embolus.  CT ANGIOGRAPHY CHEST  Technique:  Multidetector CT imaging of the chest using the standard protocol during bolus administration of intravenous contrast. Multiplanar reconstructed images including MIPs were obtained and  reviewed to evaluate the vascular anatomy.  Contrast: OMNIPAQUE IOHEXOL 350 MG/ML SOLN  Comparison: Chest radiograph performed 06/23/2012  Findings: There is no evidence of significant pulmonary embolus.  Patchy airspace opacities within both lower lung lobes most likely reflect atelectasis, though mild pneumonia could have a similar appearance.  No pleural effusion or pneumothorax is seen.  No masses are identified; no abnormal focal contrast enhancement is seen.  Diffuse coronary artery calcifications are seen.  The mediastinum is otherwise unremarkable in appearance.  No mediastinal lymphadenopathy is seen.  No pericardial effusion is identified. The great vessels are grossly unremarkable in appearance.  The esophagus is largely filled with air and fluid, raising question for esophageal dysmotility; an apparent small hiatal hernia is noted.  No axillary lymphadenopathy is seen.  The thyroid gland is unremarkable in appearance.  The visualized portions of the liver are unremarkable.  No acute osseous abnormalities are seen.  IMPRESSION:  1.  No evidence of significant pulmonary embolus. 2.  Patchy airspace opacities within both lower lung lobes most likely reflect atelectasis, though mild pneumonia could have a similar appearance. 3.  Diffuse coronary artery calcifications seen. 4.  Esophagus largely filled with air and fluid, raising question for esophageal dysmotility.  Apparent small hiatal hernia seen.   Original Report Authenticated By: Tonia Ghent, M.D.    Dg Shoulder Left  06/24/2012  *RADIOLOGY REPORT*  Clinical Data: Status post fall; left shoulder pain.  LEFT SHOULDER - 2+ VIEW  Comparison: None.  Findings: There is a chronic fracture of the proximal left humeral diaphysis, with significant surrounding healing reaction and bony remodelling, and an apparent pseudoarthrosis.  Mild medial angulation is noted.  The left humeral head remains seated at the glenoid fossa.  No additional fractures are  seen.  The visualized portions of the left lung appear clear.  No significant soft tissue abnormalities are characterized on radiograph.  IMPRESSION: Chronic fracture of the proximal left humeral diaphysis, with significant surrounding healing reaction and bony remodelling, and apparent pseudarthrosis.  Mild medial angulation noted.   Original Report Authenticated By: Tonia Ghent, M.D.    EKG:  ST with inferolateral Q waves c/w prior MI.    Lesleigh Noe 06/24/2012 3:00 PM

## 2012-06-25 ENCOUNTER — Inpatient Hospital Stay (HOSPITAL_COMMUNITY): Payer: Medicaid Other

## 2012-06-25 ENCOUNTER — Inpatient Hospital Stay (HOSPITAL_COMMUNITY): Payer: Medicaid Other | Admitting: Anesthesiology

## 2012-06-25 ENCOUNTER — Encounter (HOSPITAL_COMMUNITY)
Admission: EM | Disposition: A | Discharge status: Skilled Nursing Facility | Payer: Self-pay | Source: Home / Self Care | Attending: Internal Medicine

## 2012-06-25 ENCOUNTER — Encounter (HOSPITAL_COMMUNITY): Payer: Self-pay | Admitting: Anesthesiology

## 2012-06-25 DIAGNOSIS — F411 Generalized anxiety disorder: Secondary | ICD-10-CM

## 2012-06-25 DIAGNOSIS — G894 Chronic pain syndrome: Secondary | ICD-10-CM

## 2012-06-25 DIAGNOSIS — E860 Dehydration: Secondary | ICD-10-CM

## 2012-06-25 HISTORY — PX: FEMUR IM NAIL: SHX1597

## 2012-06-25 LAB — CBC
Hemoglobin: 11.6 g/dL — ABNORMAL LOW (ref 13.0–17.0)
MCHC: 34.9 g/dL (ref 30.0–36.0)
RBC: 3.96 MIL/uL — ABNORMAL LOW (ref 4.22–5.81)
WBC: 13.5 10*3/uL — ABNORMAL HIGH (ref 4.0–10.5)

## 2012-06-25 LAB — TROPONIN I: Troponin I: <0.3 ng/mL (ref <0.30)

## 2012-06-25 SURGERY — INSERTION, INTRAMEDULLARY ROD, FEMUR
Anesthesia: General | Site: Hip | Laterality: Left | Wound class: Clean

## 2012-06-25 MED ORDER — SUCCINYLCHOLINE CHLORIDE 20 MG/ML IJ SOLN
INTRAMUSCULAR | Status: DC | PRN
  Administered 2012-06-25: 100 mg via INTRAVENOUS

## 2012-06-25 MED ORDER — MENTHOL 3 MG MT LOZG: 1.0000 | LOZENGE | OROMUCOSAL | Status: DC | PRN

## 2012-06-25 MED ORDER — CISATRACURIUM BESYLATE (PF) 10 MG/5ML IV SOLN
INTRAVENOUS | Status: DC | PRN
  Administered 2012-06-25: 4 mg via INTRAVENOUS

## 2012-06-25 MED ORDER — ACETAMINOPHEN 325 MG PO TABS: 650.0000 mg | ORAL_TABLET | Freq: Four times a day (QID) | ORAL | Status: DC | PRN

## 2012-06-25 MED ORDER — MEPERIDINE HCL 25 MG/ML IJ SOLN: 6.2500 mg | INTRAMUSCULAR | Status: DC | PRN

## 2012-06-25 MED ORDER — EPHEDRINE SULFATE 50 MG/ML IJ SOLN
INTRAMUSCULAR | Status: DC | PRN
  Administered 2012-06-25: 15 mg via INTRAVENOUS

## 2012-06-25 MED ORDER — FERROUS SULFATE 325 (65 FE) MG PO TABS
325.0000 mg | ORAL_TABLET | Freq: Three times a day (TID) | ORAL | Status: DC
  Administered 2012-06-25 – 2012-07-03 (×21): 325 mg via ORAL
  Filled 2012-06-25 (×27): qty 1

## 2012-06-25 MED ORDER — ENOXAPARIN SODIUM 40 MG/0.4ML ~~LOC~~ SOLN
40.0000 mg | SUBCUTANEOUS | Status: DC
  Administered 2012-06-26 – 2012-07-06 (×11): 40 mg via SUBCUTANEOUS
  Filled 2012-06-25 (×15): qty 0.4

## 2012-06-25 MED ORDER — LACTATED RINGERS IV SOLN
INTRAVENOUS | Status: DC | PRN
  Administered 2012-06-25 (×2): via INTRAVENOUS

## 2012-06-25 MED ORDER — PROMETHAZINE HCL 25 MG/ML IJ SOLN: 6.2500 mg | INTRAMUSCULAR | Status: DC | PRN

## 2012-06-25 MED ORDER — 0.9 % SODIUM CHLORIDE (POUR BTL) OPTIME
TOPICAL | Status: DC | PRN
  Administered 2012-06-25: 1000 mL

## 2012-06-25 MED ORDER — ACETAMINOPHEN 10 MG/ML IV SOLN: INTRAVENOUS | Status: DC | PRN

## 2012-06-25 MED ORDER — PROPOFOL 10 MG/ML IV EMUL
INTRAVENOUS | Status: DC | PRN
  Administered 2012-06-25: 130 mg via INTRAVENOUS

## 2012-06-25 MED ORDER — ONDANSETRON HCL 4 MG/2ML IJ SOLN
INTRAMUSCULAR | Status: DC | PRN
  Administered 2012-06-25 (×2): 2 mg via INTRAVENOUS

## 2012-06-25 MED ORDER — FENTANYL CITRATE 0.05 MG/ML IJ SOLN
INTRAMUSCULAR | Status: AC
  Filled 2012-06-25: qty 2

## 2012-06-25 MED ORDER — DOCUSATE SODIUM 100 MG PO CAPS
100.0000 mg | ORAL_CAPSULE | Freq: Two times a day (BID) | ORAL | Status: DC
  Administered 2012-06-25 – 2012-06-29 (×7): 100 mg via ORAL
  Filled 2012-06-25 (×11): qty 1

## 2012-06-25 MED ORDER — FENTANYL CITRATE 0.05 MG/ML IJ SOLN
25.0000 ug | INTRAMUSCULAR | Status: DC | PRN
  Administered 2012-06-25: 25 ug via INTRAVENOUS

## 2012-06-25 MED ORDER — HYDROCODONE-ACETAMINOPHEN 5-325 MG PO TABS: 1.0000 | ORAL_TABLET | Freq: Four times a day (QID) | ORAL | Status: DC | PRN

## 2012-06-25 MED ORDER — GLYCOPYRROLATE 0.2 MG/ML IJ SOLN
INTRAMUSCULAR | Status: DC | PRN
  Administered 2012-06-25: 0.4 mg via INTRAVENOUS

## 2012-06-25 MED ORDER — METOPROLOL TARTRATE 25 MG PO TABS
25.0000 mg | ORAL_TABLET | Freq: Two times a day (BID) | ORAL | Status: DC
  Administered 2012-06-25 (×2): 25 mg via ORAL
  Filled 2012-06-25 (×4): qty 1

## 2012-06-25 MED ORDER — ENOXAPARIN SODIUM 40 MG/0.4ML ~~LOC~~ SOLN: 40.0000 mg | SUBCUTANEOUS | Status: DC

## 2012-06-25 MED ORDER — ACETAMINOPHEN 10 MG/ML IV SOLN
INTRAVENOUS | Status: DC | PRN
  Administered 2012-06-25: 500 mg via INTRAVENOUS

## 2012-06-25 MED ORDER — POLYETHYLENE GLYCOL 3350 17 G PO PACK
17.0000 g | PACK | Freq: Every day | ORAL | Status: DC | PRN
  Filled 2012-06-25: qty 1

## 2012-06-25 MED ORDER — METOCLOPRAMIDE HCL 5 MG/ML IJ SOLN: 5.0000 mg | Freq: Three times a day (TID) | INTRAMUSCULAR | Status: DC | PRN

## 2012-06-25 MED ORDER — PHENYLEPHRINE HCL 10 MG/ML IJ SOLN
10000.0000 ug | INTRAVENOUS | Status: DC | PRN
  Administered 2012-06-25: 50 ug/min via INTRAVENOUS

## 2012-06-25 MED ORDER — METOCLOPRAMIDE HCL 10 MG PO TABS: 5.0000 mg | ORAL_TABLET | Freq: Three times a day (TID) | ORAL | Status: DC | PRN

## 2012-06-25 MED ORDER — PHENOL 1.4 % MT LIQD: 1.0000 | OROMUCOSAL | Status: DC | PRN

## 2012-06-25 MED ORDER — ONDANSETRON HCL 4 MG/2ML IJ SOLN: 4.0000 mg | Freq: Four times a day (QID) | INTRAMUSCULAR | Status: DC | PRN

## 2012-06-25 MED ORDER — ACETAMINOPHEN 650 MG RE SUPP: 650.0000 mg | Freq: Four times a day (QID) | RECTAL | Status: DC | PRN

## 2012-06-25 MED ORDER — CLINDAMYCIN PHOSPHATE 900 MG/50ML IV SOLN
INTRAVENOUS | Status: DC | PRN
  Administered 2012-06-25: 900 mg via INTRAVENOUS

## 2012-06-25 MED ORDER — PHENYLEPHRINE HCL 10 MG/ML IJ SOLN
INTRAMUSCULAR | Status: DC | PRN
  Administered 2012-06-25: 80 ug via INTRAVENOUS
  Administered 2012-06-25 (×3): 40 ug via INTRAVENOUS

## 2012-06-25 MED ORDER — NEOSTIGMINE METHYLSULFATE 1 MG/ML IJ SOLN
INTRAMUSCULAR | Status: DC | PRN
  Administered 2012-06-25: 3 mg via INTRAVENOUS

## 2012-06-25 MED ORDER — CLINDAMYCIN PHOSPHATE 900 MG/50ML IV SOLN: 900.0000 mg | Freq: Once | INTRAVENOUS | Status: DC

## 2012-06-25 MED ORDER — ONDANSETRON HCL 4 MG PO TABS: 4.0000 mg | ORAL_TABLET | Freq: Four times a day (QID) | ORAL | Status: DC | PRN

## 2012-06-25 MED ORDER — FENTANYL CITRATE 0.05 MG/ML IJ SOLN
INTRAMUSCULAR | Status: DC | PRN
  Administered 2012-06-25: 25 ug via INTRAVENOUS
  Administered 2012-06-25 (×3): 50 ug via INTRAVENOUS
  Administered 2012-06-25: 25 ug via INTRAVENOUS

## 2012-06-25 MED ORDER — LACTATED RINGERS IV SOLN
INTRAVENOUS | Status: AC
  Administered 2012-06-25 – 2012-07-07 (×10): via INTRAVENOUS

## 2012-06-25 SURGICAL SUPPLY — 36 items
ADH SKN CLS APL DERMABOND .7 (GAUZE/BANDAGES/DRESSINGS) ×1
BAG SPEC THK2 15X12 ZIP CLS (MISCELLANEOUS) ×1
BAG ZIPLOCK 12X15 (MISCELLANEOUS) ×2 IMPLANT
BIT DRILL CANN LG 4.3MM (BIT) IMPLANT
CLOTH BEACON ORANGE TIMEOUT ST (SAFETY) ×2 IMPLANT
DERMABOND ADVANCED (GAUZE/BANDAGES/DRESSINGS) ×1
DERMABOND ADVANCED .7 DNX12 (GAUZE/BANDAGES/DRESSINGS) IMPLANT
DRAPE STERI IOBAN 125X83 (DRAPES) ×2 IMPLANT
DRILL BIT CANN LG 4.3MM (BIT) ×2
DRSG AQUACEL AG ADV 3.5X 4 (GAUZE/BANDAGES/DRESSINGS) ×4 IMPLANT
DRSG AQUACEL AG ADV 3.5X 6 (GAUZE/BANDAGES/DRESSINGS) ×2 IMPLANT
DURAPREP 26ML APPLICATOR (WOUND CARE) ×2 IMPLANT
ELECT REM PT RETURN 9FT ADLT (ELECTROSURGICAL) ×2
ELECTRODE REM PT RTRN 9FT ADLT (ELECTROSURGICAL) ×1 IMPLANT
GLOVE BIOGEL PI IND STRL 7.5 (GLOVE) ×1 IMPLANT
GLOVE BIOGEL PI IND STRL 8 (GLOVE) ×1 IMPLANT
GLOVE BIOGEL PI INDICATOR 7.5 (GLOVE) ×1
GLOVE BIOGEL PI INDICATOR 8 (GLOVE) ×1
GLOVE ECLIPSE 8.0 STRL XLNG CF (GLOVE) ×1 IMPLANT
GLOVE ORTHO TXT STRL SZ7.5 (GLOVE) ×3 IMPLANT
GOWN BRE IMP PREV XXLGXLNG (GOWN DISPOSABLE) ×3 IMPLANT
GOWN STRL NON-REIN LRG LVL3 (GOWN DISPOSABLE) ×3 IMPLANT
GUIDEPIN 3.2X17.5 THRD DISP (PIN) ×2 IMPLANT
HFN LAG SCREW 10.5MM X 115MM (Orthopedic Implant) ×1 IMPLANT
KIT BASIN OR (CUSTOM PROCEDURE TRAY) ×2 IMPLANT
MANIFOLD NEPTUNE II (INSTRUMENTS) ×2 IMPLANT
NAIL HIP FRACT 130D 11X180 (Screw) ×1 IMPLANT
PACK GENERAL/GYN (CUSTOM PROCEDURE TRAY) ×2 IMPLANT
POSITIONER SURGICAL ARM (MISCELLANEOUS) ×3 IMPLANT
SCREW BONE CORTICAL 5.0X40 (Screw) ×1 IMPLANT
STAPLER VISISTAT 35W (STAPLE) ×2 IMPLANT
SUT VIC AB 1 CT1 27 (SUTURE) ×2
SUT VIC AB 1 CT1 27XBRD ANTBC (SUTURE) ×1 IMPLANT
SUT VIC AB 2-0 CT1 27 (SUTURE) ×4
SUT VIC AB 2-0 CT1 27XBRD (SUTURE) ×2 IMPLANT
TOWEL OR 17X26 10 PK STRL BLUE (TOWEL DISPOSABLE) ×2 IMPLANT

## 2012-06-25 NOTE — Progress Notes (Signed)
Patient is confused and lethargic and unable to participate in assessment. Patient will need snf. CSW called patient's sister and left voicemail.  Kishan Wachsmuth C. Nyriah Coote MSW, LCSW [phone]

## 2012-06-25 NOTE — Transfer of Care (Signed)
Immediate Anesthesia Transfer of Care Note  Patient: Steven [name]  Procedure(s) Performed: Procedure(s): INTRAMEDULLARY (IM) NAIL FEMORAL (Left)  Patient Location: PACU  Anesthesia Type:General  Level of Consciousness: awake, alert , oriented and patient cooperative  Airway & Oxygen Therapy: Patient Spontanous Breathing, Patient connected to face mask oxygen and Patient connected to face mask  Post-op Assessment: Report given to PACU RN and Post -op Vital signs reviewed and stable  Post vital signs: stable  Complications: No apparent anesthesia complications

## 2012-06-25 NOTE — Progress Notes (Signed)
  Echocardiogram 2D Echocardiogram has been performed.  Steven [name] 06/25/2012, 2:52 PM

## 2012-06-25 NOTE — Progress Notes (Signed)
MD notified of pt oxygen saturations sustaining in upper 80's.  Lung sounds diminished. No crackles auscultated. Pt alert and watching TV.  Confused and tachycardic however these are not new symptoms for this patient. Per MD oxygen increased to 4L per [place].

## 2012-06-25 NOTE — Progress Notes (Addendum)
Subjective:  Had OR this am.  Tells me he has not had an operation.  No c/o chest pain or SOB  Objective:  Vital Signs in the last 24 hours: BP 110/72  Pulse 128  Temp(Src) 98.9 F (37.2 C) (Oral)  Resp 18  Ht 5' 1.81" (1.57 m)  Wt 64.411 kg (142 lb)  BMI 26.13 kg/m2  SpO2 98%  Physical Exam: Confused WM lying quietly in bed Lungs:  Clear Cardiac:  Rapid irregular rhythm, normal S1 and S2, no S3 Extremities:  No edema present  Intake/Output from previous day: 04/17 0701 - 04/18 0700 In: 2811.7 [P.O.:240; I.V.:2321.7; IV Piggyback:250] Out: 650 [Urine:650] Weight Filed Weights   06/24/12 0837  Weight: 64.411 kg (142 lb)    Lab Results: Basic Metabolic Panel:  Recent Labs  65/78/46 2341 06/24/12 0405  NA 136 135  K 4.0 3.7  CL 97 100  CO2 20 20  GLUCOSE 144* 129*  BUN 19 17  CREATININE 0.74 0.66    CBC:  Recent Labs  06/23/12 2341 06/24/12 0405 06/25/12 0949  WBC 20.7* 17.0* 13.5*  NEUTROABS 18.2*  --   --   HGB 17.2* 15.6 11.6*  HCT 47.7 44.0 33.2*  MCV 81.4 82.1 83.8  PLT 193 147* 107*    BNP    Component Value Date/Time   PROBNP 1830.0* 06/24/2012 0551    Telemetry: Sinus tachycardia  Assessment/Plan:  1. Appears relatively stable immediately post op 2. Dementia 3. ?history of CAD  Rec:  Be careful with fluids.  Check ECHO when able to.      Darden Palmer  MD [facility] Cardiology  06/25/2012, 1:55 PM

## 2012-06-25 NOTE — Brief Op Note (Signed)
06/23/2012 - 06/25/2012  8:20 AM  PATIENT:  Steven [name]  62 y.o. male  PRE-OPERATIVE DIAGNOSIS:  Comminuted left intertrochanteric fracture  POST-OPERATIVE DIAGNOSIS:  Comminuted left intertrochanteric fracture  PROCEDURE:  Procedure(s): INTRAMEDULLARY (IM) NAIL FEMORAL (Left), ORIF left hip fracture  SURGEON:  Surgeon(s) and Role:    * Shelda Pal, MD - Primary  PHYSICIAN ASSISTANT: Lanney Gins, PA-C  ANESTHESIA:   general  EBL:  Total I/O In: 1000 [I.V.:1000] Out: -   BLOOD ADMINISTERED:none  DRAINS: none   LOCAL MEDICATIONS USED:  NONE  SPECIMEN:  No Specimen  DISPOSITION OF SPECIMEN:  N/A  COUNTS:  YES  TOURNIQUET:  * No tourniquets in log *  DICTATION: .Other Dictation: Dictation Number [id number]  PLAN OF CARE: Admit to inpatient   PATIENT DISPOSITION:  PACU - hemodynamically stable.   Delay start of Pharmacological VTE agent (>24hrs) due to surgical blood loss or risk of bleeding: no

## 2012-06-25 NOTE — Anesthesia Postprocedure Evaluation (Signed)
  Anesthesia Post-op Note  Patient: Steven [name]  Procedure(s) Performed: Procedure(s) (LRB): INTRAMEDULLARY (IM) NAIL FEMORAL (Left)  Patient Location: PACU  Anesthesia Type: General  Level of Consciousness: awake and alert   Airway and Oxygen Therapy: Patient Spontanous Breathing  Post-op Pain: mild  Post-op Assessment: Post-op Vital signs reviewed, Patient's Cardiovascular Status Stable, Respiratory Function Stable, Patent Airway and No signs of Nausea or vomiting  Last Vitals:  Filed Vitals:   06/25/12 0930  BP: 95/74  Pulse: 124  Temp:   Resp: 12    Post-op Vital Signs: stable   Complications: No apparent anesthesia complications

## 2012-06-25 NOTE — Progress Notes (Signed)
Patient ID: Steven [name], male   DOB: [birth year], 62 y.o.   MRN: [id number]  Patient ready for the OR today Consent signed yesterday by his sister  To OR today for ORIF of left hip

## 2012-06-25 NOTE — Op Note (Signed)
NAMEMarland Kitchen  OLUWADEMILADE, MCKIVER NO.:  [id number]  MEDICAL RECORD NO.:  [id number]  LOCATION:  1508                         FACILITY:  [facility]  PHYSICIAN:  Madlyn Frankel. Charlann Boxer, M.D.  DATE OF BIRTH:  01-27-1951  DATE OF PROCEDURE:  06/25/2012 DATE OF DISCHARGE:                              OPERATIVE REPORT   PREOPERATIVE DIAGNOSIS:  Comminuted left intertrochanteric femur fracture.  POSTOPERATIVE DIAGNOSIS:  Comminuted left intertrochanteric femur fracture.  PROCEDURE:  Open reduction and internal fixation of the left comminuted intertrochanteric femur fracture utilizing a Biomet Affixus nail 11 x 180 mm, 130 degree lag screw, distal interlock.  SURGEON:  Madlyn Frankel. Charlann Boxer, M.D.  ASSISTANT:  Lanney Gins, PA-C.  Mr. Carmon Sails was present for the entirety of the case, utilized for preparation of proximal femur as the fracture was maintained and it was reduced, placed general facilitation of the case, primary wound closure.  ANESTHESIA:  General.  ESTIMATED BLOOD LOSS:  Probably 100 mL.  DRAINS:  None.  COMPLICATION:  None.  SPECIMENS:  None.  INDICATIONS FOR PROCEDURE:  Mr. Mofield is a 62 year old male with multiple medical comorbidities including dementia.  He currently resides in a nursing facility and had a ground level fall.  He was brought to the emergency room with obvious deformity of his hip.  Radiographs revealed a comminuted intertrochanteric fracture.  Orthopedics was consulted.  He was admitted to the medical service.  Risks and benefits of the fracture, reduction, and surgery were reviewed with his sister acting as power of attorney.  Consent was obtained for fracture management.  PROCEDURE IN DETAIL:  The patient was brought to the operative theater. Once adequate anesthesia, preoperative antibiotics, clindamycin administered the patient already had been preoperatively on at least 1 other antibiotic, so we gave him clindamycin preoperatively.  He was  positioned supine on the fracture table.  His left foot was placed in the traction boot.  His right leg was flexed and abducted out of way with bony prominence padded particularly over the lateral peroneal nerve region. Traction and internal rotation applied.  A fluoroscopy used to confirm fracture reduction as well as defined the true anatomy.  The fracture including comminution in the coronal plane  component.  At this point, the left hip was prepped and draped in the sterile fashion using shower curtain technique from the iliac crest to the knee area.  Fluoroscopy was brought into the field again based on the evaluation preoperatively to determine the location of the incision.  The time-out was performed identifying the patient, planned procedure, and extremity.  Incision was then made  posterior lateral to be able to get to the posterior aspect of the greater trochanter based on my assessment preoperatively.  The guidewire was then inserted.  I maintained reduction of the fracture segment by holding the greater trochanter in an elevated position.  The guidewire was then inserted through the tip of the trochanter with the assistance of Babish.  The wire was passed into the femur and confirmed radiographically.  I then drilled the proximal femur.  Then passed the intramedullary nail by hand into the shaft of the femur.  Once it was placed into its  appropriate depth.  I then passed the guidewire into the center of the head in AP and lateral planes, confirmed radiographically.  I measured and selected a 115 mm lag screw.  I drilled and then placed the screw. Based on the comminuted segments and bone quality, I chose to lock this screw down to make it a fixed angle device.  Distal interlock was placed distally.  All wounds were irrigated.  Final radiographs were obtained in the AP and lateral planes.  The proximal wound was closed with #1 Vicryl, then 2-0 Vicryl, and 4-0  running Monocryl.  The other wounds were closed with 2-0 Vicryl and Dermabond. The hip wounds were cleaned, dried, and dressed sterilely using Aquacel dressing.  The patient was brought to the recovery room, extubated in stable condition tolerating the procedure well.     Madlyn Frankel Charlann Boxer, M.D.     MDO/MEDQ  D:  06/25/2012  T:  06/25/2012  Job:  [id number]

## 2012-06-25 NOTE — Progress Notes (Signed)
[facility] PROGRESS NOTE  PAULA ZIETZ ZOX:[id number] DOB: [birth year] DOA: 06/23/2012 PCP: No primary provider on file.  Assessment/Plan:                                                                                                                1. Left femoral intertrochanteric fracture -   Cardiology called for pre op clearance. Orthopedics on board.  2.  Sinus tachycardia - not sure what's causing it.  Improved. May be pain related or due to dehydration. Continue with hydration. CT angiogram of the chest to rule out PE was negative. Free t4 and TSH within normal limits. Echocardiogram ordered. Repeat EKG pending.  3. History of CVA with left-sided hemiparesis - presently holding of Plavix for possible surgery of the left hip. 4. Dementia - no acute issues. 5. Chronic left humerus fracture. 6. Leukocytosis - patient is afebrile. Chest x-ray and UA are unremarkable except the chest x-ray showing mild congestion.  Repeat CBC is pending. 7. Positive Blood cultures showing gram positive cocci and gram negative rods:  Patient is on aztreonam, vancomycin and levaquin for HCAP, which should cover bacteremia.  8. DVT prophylaxis.  Code Status: full code Family Communication: none at bedside Disposition Plan: pending PT eval.    Consultants:  Cardiology  orthopedics  HPI/Subjective:   Objective: Filed Vitals:   06/25/12 0400 06/25/12 0455 06/25/12 0858 06/25/12 0900  BP:  117/78 122/86   Pulse:  110 120 120  Temp:  98.2 F (36.8 C) 98.7 F (37.1 C)   TempSrc:  Oral    Resp: 18 18 16    Height:      Weight:      SpO2: 94% 94% 95%     Intake/Output Summary (Last 24 hours) at 06/25/12 0908 Last data filed at 06/25/12 0903  Gross per 24 hour  Intake 4211.67 ml  Output    900 ml  Net 3311.67 ml   Filed Weights   06/24/12 0837  Weight: 64.411 kg (142 lb)    Exam:  General: Well-developed well-nourished.   Cardiovascular: S1-S2 heard tachycardic.  Respiratory:  No rhonchi or crepitations. A bdomen: Soft nontender bowel sounds present.   Musculoskeletal: Pain on moving left shoulder and left leg.  Marland Kitchen Neurologic: Has left-sided weakness from old stroke  Data Reviewed: Basic Metabolic Panel:  Recent Labs Lab 06/23/12 2341 06/24/12 0405  NA 136 135  K 4.0 3.7  CL 97 100  CO2 20 20  GLUCOSE 144* 129*  BUN 19 17  CREATININE 0.74 0.66  CALCIUM 9.3 8.4   Liver Function Tests:  Recent Labs Lab 06/24/12 0405  ALBUMIN 3.1*   No results found for this basename: LIPASE, AMYLASE,  in the last 168 hours No results found for this basename: AMMONIA,  in the last 168 hours CBC:  Recent Labs Lab 06/23/12 2341 06/24/12 0405  WBC 20.7* 17.0*  NEUTROABS 18.2*  --   HGB 17.2* 15.6  HCT 47.7 44.0  MCV 81.4 82.1  PLT 193 147*  Cardiac Enzymes:  Recent Labs Lab 06/23/12 2341 06/24/12 0405  TROPONINI <0.30 <0.30   BNP (last 3 results)  Recent Labs  06/24/12 0405 06/24/12 0551  PROBNP 1874.0* 1830.0*   CBG: No results found for this basename: GLUCAP,  in the last 168 hours  Recent Results (from the past 240 hour(s))  CULTURE, BLOOD (ROUTINE X 2)     Status: None   Collection Time    06/24/12  4:05 AM      Result Value Range Status   Specimen Description BLOOD LEFT ARM   Final   Special Requests BOTTLES DRAWN AEROBIC AND ANAEROBIC 4CC   Final   Culture  Setup Time 06/24/2012 08:30   Final   Culture     Final   Value: GRAM NEGATIVE RODS     GRAM POSITIVE COCCI IN CHAINS     Note: Gram Stain Report Called to,Read Back By and Verified With: MONIQUE HOWLETT ON 06/24/2012 AT 11:16P BY WILEJ   Report Status PENDING   Incomplete  CULTURE, BLOOD (ROUTINE X 2)     Status: None   Collection Time    06/24/12  4:05 AM      Result Value Range Status   Specimen Description BLOOD RIGHT ANTECUBITAL   Final   Special Requests BOTTLES DRAWN AEROBIC AND ANAEROBIC 5CC   Final   Culture  Setup Time 06/24/2012 08:30   Final   Culture     Final    Value: GRAM POSITIVE COCCI IN CHAINS     GRAM NEGATIVE RODS     Note: Gram Stain Report Called to,Read Back By and Verified With: MONIQUE HOWLETT ON 06/24/2012 AT 11:16P   Report Status PENDING   Incomplete  MRSA PCR SCREENING     Status: None   Collection Time    06/24/12  4:52 AM      Result Value Range Status   MRSA by PCR NEGATIVE  NEGATIVE Final   Comment:            The GeneXpert MRSA Assay (FDA     approved for NASAL specimens     only), is one component of a     comprehensive MRSA colonization     surveillance program. It is not     intended to diagnose MRSA     infection nor to guide or     monitor treatment for     MRSA infections.     Studies: Dg Chest 1 View  06/24/2012  *RADIOLOGY REPORT*  Clinical Data: Status post fall; concern for chest injury.  CHEST - 1 VIEW  Comparison: Chest radiograph performed 06/02/2011  Findings: The lungs are mildly hypoexpanded.  Mild vascular congestion is seen.  The lungs remain grossly clear.  There is no evidence of focal opacification, pleural effusion or pneumothorax.  The cardiomediastinal silhouette is within normal limits.  No acute osseous abnormalities are seen.  IMPRESSION: Mild vascular congestion seen; lungs hypoexpanded but grossly clear.  No displaced rib fractures identified.   Original Report Authenticated By: Tonia Ghent, M.D.    Dg Hip Complete Left  06/24/2012  *RADIOLOGY REPORT*  Clinical Data: Status post fall; left hip pain.  LEFT HIP - COMPLETE 2+ VIEW  Comparison: None.  Findings: There is a comminuted left femoral intertrochanteric fracture, though given comminution, this appears to involve an underlying basicervical left femoral neck fracture.  A displaced lesser trochanteric fragment is seen.  There is significant medial angulation; the left femoral head remains seated at  the acetabulum.  No additional fractures are seen.  The right hip joint is grossly unremarkable in appearance.  No significant degenerative change  is appreciated.  The sacroiliac joints are unremarkable in appearance.  The visualized bowel gas pattern is grossly unremarkable in appearance.  Scattered vascular calcifications are seen.  IMPRESSION:  1.  Comminuted left femoral intertrochanteric fracture; given comminution, this appears to involve an underlying basicervical left femoral neck fracture.  Displaced lesser trochanteric fragment seen, with significant medial angulation.  No evidence of dislocation. 2.  Scattered vascular calcifications seen.   Original Report Authenticated By: Tonia Ghent, M.D.    Dg Hip Operative Left  06/25/2012  *RADIOLOGY REPORT*  Clinical Data: Left hip intramedullary rod.  OPERATIVE LEFT HIP  Comparison: Radiographs 06/23/2012.  Findings: Three spot fluoroscopic images demonstrate the placement of a left hip dynamic screw and intramedullary rod.  This is secured by a distal interlocking screw.  The comminuted intertrochanteric femur fracture demonstrates near anatomic reduction.  No complications are identified.  IMPRESSION: Near anatomic reduction of the intertrochanteric femur fracture status post fixation.   Original Report Authenticated By: Carey Bullocks, M.D.    Ct Head Wo Contrast  06/24/2012  *RADIOLOGY REPORT*  Clinical Data: Status post fall out of bed.  Concern for head injury.  CT HEAD WITHOUT CONTRAST  Technique:  Contiguous axial images were obtained from the base of the skull through the vertex without contrast.  Comparison: CT of the head performed 07/16/2010  Findings: There is no evidence of acute infarction, mass lesion, or intra- or extra-axial hemorrhage on CT.  Prominence of the ventricles and sulci reflects moderate cortical volume loss.  Diffuse periventricular and subcortical white matter change reflects small vessel ischemic microangiopathy.  Chronic lacunar infarcts are seen within the basal ganglia bilaterally, and within the right cerebellar hemisphere.  Mild cerebellar atrophy is noted.  The  brainstem and fourth ventricle are within normal limits.  The cerebral hemispheres demonstrate grossly normal gray-white differentiation.  No mass effect or midline shift is seen.  There is no evidence of fracture; visualized osseous structures are unremarkable in appearance.  The orbits are within normal limits. The paranasal sinuses and mastoid air cells are well-aerated.  No significant soft tissue abnormalities are seen.  IMPRESSION:  1.  No evidence of traumatic intracranial injury or fracture. 2.  Moderate cortical volume loss and diffuse small vessel ischemic microangiopathy. 3.  Chronic lacunar infarcts within the basal ganglia bilaterally, and in the right cerebellar hemisphere.   Original Report Authenticated By: Tonia Ghent, M.D.    Ct Angio Chest Pe W/cm &/or Wo Cm  06/24/2012  *RADIOLOGY REPORT*  Clinical Data: Larey Seat out of bed.  Tachycardia.  Leukocytosis. Concern for pulmonary embolus.  CT ANGIOGRAPHY CHEST  Technique:  Multidetector CT imaging of the chest using the standard protocol during bolus administration of intravenous contrast. Multiplanar reconstructed images including MIPs were obtained and reviewed to evaluate the vascular anatomy.  Contrast: OMNIPAQUE IOHEXOL 350 MG/ML SOLN  Comparison: Chest radiograph performed 06/23/2012  Findings: There is no evidence of significant pulmonary embolus.  Patchy airspace opacities within both lower lung lobes most likely reflect atelectasis, though mild pneumonia could have a similar appearance.  No pleural effusion or pneumothorax is seen.  No masses are identified; no abnormal focal contrast enhancement is seen.  Diffuse coronary artery calcifications are seen.  The mediastinum is otherwise unremarkable in appearance.  No mediastinal lymphadenopathy is seen.  No pericardial effusion is identified. The great vessels are  grossly unremarkable in appearance.  The esophagus is largely filled with air and fluid, raising question for esophageal  dysmotility; an apparent small hiatal hernia is noted.  No axillary lymphadenopathy is seen.  The thyroid gland is unremarkable in appearance.  The visualized portions of the liver are unremarkable.  No acute osseous abnormalities are seen.  IMPRESSION:  1.  No evidence of significant pulmonary embolus. 2.  Patchy airspace opacities within both lower lung lobes most likely reflect atelectasis, though mild pneumonia could have a similar appearance. 3.  Diffuse coronary artery calcifications seen. 4.  Esophagus largely filled with air and fluid, raising question for esophageal dysmotility.  Apparent small hiatal hernia seen.   Original Report Authenticated By: Tonia Ghent, M.D.    Dg Shoulder Left  06/24/2012  *RADIOLOGY REPORT*  Clinical Data: Status post fall; left shoulder pain.  LEFT SHOULDER - 2+ VIEW  Comparison: None.  Findings: There is a chronic fracture of the proximal left humeral diaphysis, with significant surrounding healing reaction and bony remodelling, and an apparent pseudoarthrosis.  Mild medial angulation is noted.  The left humeral head remains seated at the glenoid fossa.  No additional fractures are seen.  The visualized portions of the left lung appear clear.  No significant soft tissue abnormalities are characterized on radiograph.  IMPRESSION: Chronic fracture of the proximal left humeral diaphysis, with significant surrounding healing reaction and bony remodelling, and apparent pseudarthrosis.  Mild medial angulation noted.   Original Report Authenticated By: Tonia Ghent, M.D.     Scheduled Meds: . aztreonam  1 g Intravenous Q8H  . levofloxacin (LEVAQUIN) IV  750 mg Intravenous Q24H  . metoprolol tartrate  25 mg Oral BID  . pantoprazole  40 mg Oral Daily  . tamsulosin  0.4 mg Oral QHS  . vancomycin  750 mg Intravenous Q12H   Continuous Infusions:    Principal Problem:   Closed left hip fracture Active Problems:   CVA   Dementia   Fracture of left humerus    Leucocytosis    Time spent: 30 min    Malorie Bigford  [facility] Pager [phone] If 7PM-7AM, please contact night-coverage at [email or web address], password [facility] 06/25/2012, 9:08 AM  LOS: 2 days

## 2012-06-25 NOTE — Anesthesia Preprocedure Evaluation (Signed)
Anesthesia Evaluation  Patient identified by MRN, date of birth, ID band Patient awake and Patient confused    Reviewed: Allergy & Precautions, H&P , NPO status , Patient's Chart, lab work & pertinent test results  Airway Mallampati: II TM Distance: >3 FB Neck ROM: Full    Dental no notable dental hx.    Pulmonary neg pulmonary ROS,  breath sounds clear to auscultation  Pulmonary exam normal       Cardiovascular - Peripheral Vascular Disease negative cardio ROS  Rhythm:Regular Rate:Normal     Neuro/Psych PSYCHIATRIC DISORDERS Anxiety Dementia. ?h/o EtOH abuse CVA, Residual Symptoms negative neurological ROS  negative psych ROS   GI/Hepatic negative GI ROS, Neg liver ROS,   Endo/Other  negative endocrine ROS  Renal/GU negative Renal ROS  negative genitourinary   Musculoskeletal negative musculoskeletal ROS (+)   Abdominal   Peds negative pediatric ROS (+)  Hematology negative hematology ROS (+)   Anesthesia Other Findings   Reproductive/Obstetrics negative OB ROS                           Anesthesia Physical Anesthesia Plan  ASA: III  Anesthesia Plan: General   Post-op Pain Management:    Induction: Intravenous  Airway Management Planned: Oral ETT  Additional Equipment:   Intra-op Plan:   Post-operative Plan: Extubation in OR  Informed Consent: I have reviewed the patients History and Physical, chart, labs and discussed the procedure including the risks, benefits and alternatives for the proposed anesthesia with the patient or authorized representative who has indicated his/her understanding and acceptance.   Dental advisory given  Plan Discussed with: CRNA  Anesthesia Plan Comments:         Anesthesia Quick Evaluation

## 2012-06-26 DIAGNOSIS — B952 Enterococcus as the cause of diseases classified elsewhere: Secondary | ICD-10-CM | POA: Diagnosis present

## 2012-06-26 LAB — BASIC METABOLIC PANEL
Chloride: 98 mEq/L (ref 96–112)
GFR calc Af Amer: >90 mL/min (ref >90)
GFR calc non Af Amer: >90 mL/min (ref >90)
Potassium: 3.2 mEq/L — ABNORMAL LOW (ref 3.5–5.1)
Sodium: 131 mEq/L — ABNORMAL LOW (ref 135–145)

## 2012-06-26 LAB — CBC
MCHC: 35.3 g/dL (ref 30.0–36.0)
Platelets: 116 10*3/uL — ABNORMAL LOW (ref 150–400)
RDW: 13.3 % (ref 11.5–15.5)
WBC: 13 10*3/uL — ABNORMAL HIGH (ref 4.0–10.5)

## 2012-06-26 LAB — VANCOMYCIN, TROUGH: Vancomycin Tr: <5 ug/mL — ABNORMAL LOW (ref 10.0–20.0)

## 2012-06-26 MED ORDER — METOPROLOL TARTRATE 50 MG PO TABS
50.0000 mg | ORAL_TABLET | Freq: Two times a day (BID) | ORAL | Status: DC
  Administered 2012-06-26 – 2012-07-15 (×31): 50 mg via ORAL
  Filled 2012-06-26 (×40): qty 1

## 2012-06-26 MED ORDER — VANCOMYCIN HCL IN DEXTROSE 1-5 GM/200ML-% IV SOLN
1000.0000 mg | Freq: Once | INTRAVENOUS | Status: AC
  Administered 2012-06-26: 1000 mg via INTRAVENOUS
  Filled 2012-06-26: qty 200

## 2012-06-26 MED ORDER — VANCOMYCIN HCL IN DEXTROSE 1-5 GM/200ML-% IV SOLN
1000.0000 mg | Freq: Three times a day (TID) | INTRAVENOUS | Status: DC
  Administered 2012-06-26 – 2012-06-27 (×3): 1000 mg via INTRAVENOUS
  Filled 2012-06-26 (×4): qty 200

## 2012-06-26 MED ORDER — POTASSIUM CHLORIDE CRYS ER 20 MEQ PO TBCR
40.0000 meq | EXTENDED_RELEASE_TABLET | Freq: Two times a day (BID) | ORAL | Status: AC
  Administered 2012-06-26 (×2): 40 meq via ORAL
  Filled 2012-06-26 (×2): qty 2

## 2012-06-26 NOTE — Progress Notes (Signed)
Subjective: Pain well controlled. Progressing well. Tolerating PO's. Denies BM at this time.  Objective: Vital signs in last 24 hours: Temp:  [97.8 F (36.6 C)-99.1 F (37.3 C)] 98.6 F (37 C) (04/19 0600) Pulse Rate:  [108-137] 113 (04/19 0600) Resp:  [11-19] 16 (04/19 0600) BP: (92-122)/(62-86) 105/75 mmHg (04/19 0600) SpO2:  [87 %-100 %] 91 % (04/19 0600)  Intake/Output from previous day: 04/18 0701 - 04/19 0700 In: 3600 [I.V.:3250; IV Piggyback:350] Out: 1250 [Urine:1100; Blood:150] Intake/Output this shift:     Recent Labs  06/23/12 2341 06/24/12 0405 06/25/12 0949 06/26/12 0533  HGB 17.2* 15.6 11.6* 10.8*    Recent Labs  06/25/12 0949 06/26/12 0533  WBC 13.5* 13.0*  RBC 3.96* 3.69*  HCT 33.2* 30.6*  PLT 107* 116*    Recent Labs  06/24/12 0405 06/26/12 0533  NA 135 131*  K 3.7 3.2*  CL 100 98  CO2 20 26  BUN 17 22  CREATININE 0.66 0.67  GLUCOSE 129* 125*  CALCIUM 8.4 7.7*   No results found for this basename: LABPT, INR,  in the last 72 hours  Left hip dressing C/D/I. Mild edema of the left hip area. LLE neurovascularly intact. No signs of SOB. Pleasantly confused.   Assessment/Plan: POD# 2  S/P Left Hip ORIF  Continue current care. Progress with PT.  Continue care with cardiology and medicine service.    Kani Jobson L 06/26/2012, 8:31 AM

## 2012-06-26 NOTE — Progress Notes (Signed)
Occupational Therapy  Order received, reviewed chart/spoke with PT.  Pt admitted from SNF with plans to return to SNF and was dependent with most ADLs.  Currently, he is moving with total A +2.  No acute OT needs identified.  Will sign off and defer OT to SNF.   Jeani Hawking, OTR/L [phone]

## 2012-06-26 NOTE — Evaluation (Signed)
Physical Therapy Evaluation Patient Details Name: Steven [name] MRN: [id number] DOB: 05/13/1950 Today's Date: 06/26/2012 Time: 4098-1191 PT Time Calculation (min): 23 min  PT Assessment / Plan / Recommendation Clinical Impression  Pt s/p L hip fx and ORIF presents with Functional mobility limited by post op pain and L side weakness 2*  prior CVA     PT Assessment  Patient needs continued PT services    Follow Up Recommendations  SNF    Does the patient have the potential to tolerate intense rehabilitation      Barriers to Discharge None      Equipment Recommendations  None recommended by PT    Recommendations for Other Services     Frequency Min 3X/week    Precautions / Restrictions Precautions Precautions: Fall Restrictions Weight Bearing Restrictions: Yes LLE Weight Bearing: Partial weight bearing LLE Partial Weight Bearing Percentage or Pounds: 50%   Pertinent Vitals/Pain Min c/o discomfort, premed, ice packs provided      Mobility  Bed Mobility Bed Mobility: Supine to Sit Supine to Sit: 1: +2 Total assist Supine to Sit: Patient Percentage: 40% Details for Bed Mobility Assistance: Pt assisted with pad to turn in bed and bring LEs over side.  Pt voluntarily grasping rail and assisting self to upright sitting Transfers Transfers: Sit to Stand;Stand to Sit Sit to Stand: 2: Max assist Stand to Sit: 2: Max assist Stand Pivot Transfers: 2: Max assist Details for Transfer Assistance: Pt tolerating min wt on LE and unable to assist with L UE Ambulation/Gait Ambulation/Gait Assistance: Not tested (comment)    Exercises General Exercises - Lower Extremity Ankle Circles/Pumps: PROM;10 reps;Supine;Left Heel Slides: PROM;10 reps;Supine;Left Hip ABduction/ADduction: Left;Supine;10 reps;PROM   PT Diagnosis: Difficulty walking;Hemiplegia non-dominant side;Altered mental status  PT Problem List: Decreased strength;Decreased range of motion;Decreased activity  tolerance;Decreased balance;Decreased mobility;Decreased cognition;Pain;Decreased safety awareness PT Treatment Interventions: DME instruction;Functional mobility training;Therapeutic activities;Therapeutic exercise;Patient/family education   PT Goals Acute Rehab PT Goals PT Goal Formulation: Patient unable to participate in goal setting Time For Goal Achievement: 07/02/12 Potential to Achieve Goals: Fair Pt will go Supine/Side to Sit: with mod assist PT Goal: Supine/Side to Sit - Progress: Goal set today Pt will Sit at Edge of Bed: with supervision;3-5 min;with no upper extremity support PT Goal: Sit at Edge Of Bed - Progress: Goal set today Pt will go Sit to Supine/Side: with mod assist PT Goal: Sit to Supine/Side - Progress: Goal set today Pt will go Sit to Stand: with mod assist PT Goal: Sit to Stand - Progress: Goal set today Pt will go Stand to Sit: with mod assist PT Goal: Stand to Sit - Progress: Goal set today Pt will Transfer Bed to Chair/Chair to Bed: with mod assist PT Transfer Goal: Bed to Chair/Chair to Bed - Progress: Goal set today  Visit Information  Last PT Received On: 06/26/12 Assistance Needed: +2    Subjective Data  Subjective: Sometimes I get out of bed Patient Stated Goal: No specific goals expressed   Prior Functioning  Home Living Lives With: Other (Comment) (SNF resident) Prior Function Level of Independence: Needs assistance Communication Communication: No difficulties Dominant Hand: Right    Cognition  Cognition Arousal/Alertness: Awake/alert Behavior During Therapy: Flat affect Overall Cognitive Status: History of cognitive impairments - at baseline    Extremity/Trunk Assessment Right Upper Extremity Assessment RUE ROM/Strength/Tone: [facility] for tasks assessed Left Upper Extremity Assessment LUE ROM/Strength/Tone: Deficits LUE ROM/Strength/Tone Deficits: Pt utilizing R UE to move L 2* prior CVA Right Lower  Extremity Assessment RLE  ROM/Strength/Tone: [facility] for tasks assessed Left Lower Extremity Assessment LLE ROM/Strength/Tone: Deficits LLE ROM/Strength/Tone Deficits: MIn Active movement noted with pt stating L LE similar to L UE.  Pt tolerating PROM to 90 flex and 15 abd   Balance Balance Balance Assessed: Yes Static Sitting Balance Static Sitting - Balance Support: Right upper extremity supported;Feet supported Static Sitting - Level of Assistance: 3: Mod assist;4: Min assist;5: Stand by assistance Static Sitting - Comment/# of Minutes: 5  End of Session PT - End of Session Equipment Utilized During Treatment: Gait belt Activity Tolerance: Patient tolerated treatment well Patient left: in chair;with call bell/phone within reach Nurse Communication: Mobility status  GP     Steven [name] 06/26/2012, 9:09 AM

## 2012-06-26 NOTE — Progress Notes (Signed)
ANTIBIOTIC CONSULT NOTE - Follow up  Pharmacy Consult for Vancomycin, Levofloxacin, Aztreonam Indication: rule out HCAP, bacteremia  Allergies  Allergen Reactions  . Penicillins     REACTION: unknown allergy - hx since childhood    Patient Measurements: Height: 5' 1.81" (157 cm) Weight: 142 lb (64.411 kg) IBW/kg (Calculated) : 54.17   Vital Signs: Temp: 98.6 F (37 C) (04/19 0600) Temp src: Oral (04/19 0600) BP: 105/75 mmHg (04/19 0600) Pulse Rate: 113 (04/19 0600) Intake/Output from previous day: 04/18 0701 - 04/19 0700 In: 3600 [I.V.:3250; IV Piggyback:350] Out: 1250 [Urine:1100; Blood:150]  Labs:  Recent Labs  06/23/12 2341 06/24/12 0405 06/25/12 0949 06/26/12 0533  WBC 20.7* 17.0* 13.5* 13.0*  HGB 17.2* 15.6 11.6* 10.8*  PLT 193 147* 107* 116*  CREATININE 0.74 0.66  --  0.67   Estimated Creatinine Clearance: 74.3 ml/min (by C-G formula based on Cr of 0.67).  Recent Labs  06/26/12 1000  VANCOTROUGH <5.0*    Microbiology: Recent Results (from the past 720 hour(s))  CULTURE, BLOOD (ROUTINE X 2)     Status: None   Collection Time    06/24/12  4:05 AM      Result Value Range Status   Specimen Description BLOOD LEFT ARM   Final   Special Requests BOTTLES DRAWN AEROBIC AND ANAEROBIC 4CC   Final   Culture  Setup Time 06/24/2012 08:30   Final   Culture     Final   Value: GRAM NEGATIVE RODS     GRAM POSITIVE COCCI IN CHAINS     Note: Gram Stain Report Called to,Read Back By and Verified With: MONIQUE HOWLETT ON 06/24/2012 AT 11:16P BY WILEJ   Report Status PENDING   Incomplete  CULTURE, BLOOD (ROUTINE X 2)     Status: None   Collection Time    06/24/12  4:05 AM      Result Value Range Status   Specimen Description BLOOD RIGHT ANTECUBITAL   Final   Special Requests BOTTLES DRAWN AEROBIC AND ANAEROBIC 5CC   Final   Culture  Setup Time 06/24/2012 08:30   Final   Culture     Final   Value: GRAM POSITIVE COCCI IN CHAINS     GRAM NEGATIVE RODS     Note:  Gram Stain Report Called to,Read Back By and Verified With: MONIQUE HOWLETT ON 06/24/2012 AT 11:16P   Report Status PENDING   Incomplete  MRSA PCR SCREENING     Status: None   Collection Time    06/24/12  4:52 AM      Result Value Range Status   MRSA by PCR NEGATIVE  NEGATIVE Final   Comment:            The GeneXpert MRSA Assay (FDA     approved for NASAL specimens     only), is one component of a     comprehensive MRSA colonization     surveillance program. It is not     intended to diagnose MRSA     infection nor to guide or     monitor treatment for     MRSA infections.    Anti-infectives:  4/17 >> Vancomycin >> 4/17 >> Levofloxacin >> 4/17 >> Aztreonam >>    Assessment: 62 y.o. [name] s/p unwitnessed fall at assisted living facility. X-ray reveals left hip fracture. CT of chest shows possible pneumonia. Pharmacy consulted to assist with ABX for suspected HCAP.   Blood cultures with GNR and GPC  MRSA by PCR negative  SCr WNL, CrCl ~74 ml/min CG  Vancomycin trough level (<0.5) is subtherapeutic  Goal of Therapy:  Vancomycin trough level 15-20 mcg/ml  Plan:   Continue Levofloxacin 750mg  IV q24h  Continue Aztreonam 1g IV q8h Increase to Vancomycin 1g IV q8h. Re-check Vanc trough at steady state. Follow up renal fxn and culture results.  Lynann Beaver PharmD, BCPS Pager [phone] 06/26/2012 11:Steven AM

## 2012-06-26 NOTE — Progress Notes (Signed)
Subjective:  Sitting in chair at bedside and no SOB.  Confused.  No chest pain. Says he feels so so.  Objective:  Vital Signs in the last 24 hours: BP 105/75  Pulse 113  Temp(Src) 98.6 F (37 C) (Oral)  Resp 16  Ht 5' 1.81" (1.57 m)  Wt 64.411 kg (142 lb)  BMI 26.13 kg/m2  SpO2 91%  Physical Exam: Middle aged white male sitting in chair in NAD Lungs:  Clear Cardiac:  Rapid regular rhythm,  normal S1 and S2, no S3 Extremities:  No edema present  Intake/Output from previous day: 04/18 0701 - 04/19 0700 In: 3600 [I.V.:3250; IV Piggyback:350] Out: 1250 [Urine:1100; Blood:150] Weight Filed Weights   06/24/12 0837  Weight: 64.411 kg (142 lb)    Lab Results: Basic Metabolic Panel:  Recent Labs  40/98/11 0405 06/26/12 0533  NA 135 131*  K 3.7 3.2*  CL 100 98  CO2 20 26  GLUCOSE 129* 125*  BUN 17 22  CREATININE 0.66 0.67    CBC:  Recent Labs  06/23/12 2341  06/25/12 0949 06/26/12 0533  WBC 20.7*  < > 13.5* 13.0*  NEUTROABS 18.2*  --   --   --   HGB 17.2*  < > 11.6* 10.8*  HCT 47.7  < > 33.2* 30.6*  MCV 81.4  < > 83.8 82.9  PLT 193  < > 107* 116*  < > = values in this interval not displayed.  BNP    Component Value Date/Time   PROBNP 1830.0* 06/24/2012 0551    Telemetry: Sinus tachycardia  ECHO  Preserved LV function EF 60%  Assessment/Plan:  1. Appears relatively stable immediately post op 2. Dementia 3. ?history of CAD 4. Persistent sinus tachycardia  Rec:  Increase beta blocker and reduce IV fluids.  Advance activity.   Darden Palmer  MD [facility] Cardiology  06/26/2012, 8:45 AM

## 2012-06-26 NOTE — Progress Notes (Signed)
Clinical Social Work Department BRIEF PSYCHOSOCIAL ASSESSMENT 06/26/2012  Patient:  Steven [name], Steven [name]     Account Number:  [id number]     Admit date:  06/23/2012  Clinical Social Worker:  Doree Albee  Date/Time:  06/26/2012 02:35 PM  Referred by:  RN  Date Referred:  06/26/2012 Referred for  SNF Placement  ALF Placement   Other Referral:   Interview type:  Patient Other interview type:    PSYCHOSOCIAL DATA Living Status:  FACILITY Admitted from facility:  [facility] Level of care:  Assisted Living Primary support name:  Tula Nakayama Primary support relationship to patient:  SIBLING Degree of support available:   strong    CURRENT CONCERNS Current Concerns  Post-Acute Placement   Other Concerns:    SOCIAL WORK ASSESSMENT / PLAN CSW met with pt at bedside to complete psychosocial assessment. Pt alert and orient to self and place. Pt states he lives at home with his sister and gave CSW permission to speak with pt sister.    CSW spoke with pt sister by phone [phone]). Pt sister informed CSW that patient lives at [facility]. Per discussion with pt sister, patient has to be close to independent in order to return to [place]. Per pt sister, patient is in wheelchair at [facility].    Per chart review, patient was recommended for SNF and requiring 2 person assist. Pt sister agreed that skilled nursing would be more approrpriate for patient. Patient sister shared that she will follow up with [facility] in order to hold patient bed while patient is at short term rehab at skilled.    Pt and Patient sister agreed to look at skilled nursing faiclities available in [facility]. Patient shared that patient sister helps with all of his decisions and would like her to be included.    Pt sister does not have HCPOA at this time, however is thinking about trying to obtain for the future.    CSW will initiate snf placement, please see placement note for  placement progress.   Assessment/plan status:  Psychosocial Support/Ongoing Assessment of Needs Other assessment/ plan:   Information/referral to community resources:   Skilled nursing facility list    PATIENT'S/FAMILY'S RESPONSE TO PLAN OF CARE: Patient and patient sister thanked csw for concern and support. Pt is motivated to discharge to skilled nursing for short term rehab.      Catha Gosselin, LCSWA  [phone] 06/26/2012 14:41pm

## 2012-06-26 NOTE — Progress Notes (Addendum)
Clinical Social Work Department CLINICAL SOCIAL WORK PLACEMENT NOTE 06/26/2012  Patient:  Steven [name], Steven [name]  Account Number:  [id number] Admit date:  06/23/2012  Clinical Social Worker:  Doree Albee  Date/time:  06/26/2012 03:15 PM  Clinical Social Work is seeking post-discharge placement for this patient at the following level of care:   SKILLED NURSING   (*CSW will update this form in Epic as items are completed)   06/26/2012  Patient/family provided with [facility] Department of Clinical Social Work's list of facilities offering this level of care within the geographic area requested by the patient (or if unable, by the patient's family).  06/26/2012  Patient/family informed of their freedom to choose among providers that offer the needed level of care, that participate in Medicare, Medicaid or managed care program needed by the patient, have an available bed and are willing to accept the patient.  06/26/2012  Patient/family informed of [facility]' ownership interest in [facility], as well as of the fact that they are under no obligation to receive care at this facility.  PASARR submitted to EDS on 06/26/2012 PASARR number received from EDS on 06/26/2012  FL2 transmitted to all facilities in geographic area requested by pt/family on  06/26/2012 FL2 transmitted to all facilities within larger geographic area on   Patient informed that his/her managed care company has contracts with or will negotiate with  certain facilities, including the following:     Patient/family informed of bed offers received:  07/12/12 Patient chooses bed at [facility] Physician recommends and patient chooses bed at    [facility]  [facility]12 Patient to be transferred to facility by [facility]  The following physician request were entered in Epic:   Additional Comments:  .Catha Gosselin, Theresia Majors  [phone] 06/26/2012 1516pm covering WEEKEND [phone]

## 2012-06-26 NOTE — Progress Notes (Signed)
[facility] PROGRESS NOTE  Steven [name] ZOX:[id number] DOB: [birth year] DOA: 06/23/2012 PCP: No primary provider on file.  Assessment/Plan:                                                                                                                1. Left femoral intertrochanteric fracture -  Orthopedics on board. Underwent surgical repair. PT/OT evaluation.  2.  Sinus tachycardia - not sure what's causing it.  Improved. May be pain related or due to dehydration. Continue with hydration. CT angiogram of the chest to rule out PE was negative. Free t4 and TSH within normal limits. Echocardiogram showed  3. History of CVA with left-sided hemiparesis - presently holding of Plavix forsurgery of the left hip. Will restart plavix if ortho agrees.  4. Dementia - no acute issues. 5. Chronic left humerus fracture. 6. Leukocytosis - patient is afebrile. Chest x-ray and UA are unremarkable except the chest x-ray showing mild congestion.  Repeat CBC shows improvement in leukocytosis.  7. Positive Blood cultures showing gram positive cocci and gram negative rods:  Patient is on aztreonam, vancomycin and levaquin for HCAP, which should cover bacteremia.  8. DVT prophylaxis.  Code Status: full code Family Communication: none at bedside Disposition Plan: pending PT eval.    Consultants:  Cardiology  orthopedics  HPI/Subjective:  alert but confused.  Objective: Filed Vitals:   06/26/12 0348 06/26/12 0600 06/26/12 1013 06/26/12 1325  BP:  105/75 92/59 111/75  Pulse:  113 117 103  Temp:  98.6 F (37 C) 97.8 F (36.6 C) 99.6 F (37.6 C)  TempSrc:  Oral Oral Oral  Resp: 16 16 16 16   Height:      Weight:      SpO2: 92% 91% 91% 91%    Intake/Output Summary (Last 24 hours) at 06/26/12 1335 Last data filed at 06/26/12 0640  Gross per 24 hour  Intake   1600 ml  Output    900 ml  Net    700 ml   Filed Weights   06/24/12 0837  Weight: 64.411 kg (142 lb)    Exam:  General:  Well-developed well-nourished.   Cardiovascular: S1-S2 heard tachycardic.  Respiratory: No rhonchi or crepitations. A bdomen: Soft nontender bowel sounds present.   Musculoskeletal: Pain on moving left shoulder and left leg.  Marland Kitchen Neurologic: Has left-sided weakness from old stroke  Data Reviewed: Basic Metabolic Panel:  Recent Labs Lab 06/23/12 2341 06/24/12 0405 06/26/12 0533  NA 136 135 131*  K 4.0 3.7 3.2*  CL 97 100 98  CO2 20 20 26   GLUCOSE 144* 129* 125*  BUN 19 17 22   CREATININE 0.74 0.66 0.67  CALCIUM 9.3 8.4 7.7*   Liver Function Tests:  Recent Labs Lab 06/24/12 0405  ALBUMIN 3.1*   No results found for this basename: LIPASE, AMYLASE,  in the last 168 hours No results found for this basename: AMMONIA,  in the last 168 hours CBC:  Recent Labs Lab 06/23/12 2341 06/24/12 0405 06/25/12 0949 06/26/12 0533  WBC 20.7* 17.0* 13.5* 13.0*  NEUTROABS 18.2*  --   --   --   HGB 17.2* 15.6 11.6* 10.8*  HCT 47.7 44.0 33.2* 30.6*  MCV 81.4 82.1 83.8 82.9  PLT 193 147* 107* 116*   Cardiac Enzymes:  Recent Labs Lab 06/23/12 2341 06/24/12 0405 06/25/12 0950  TROPONINI <0.30 <0.30 <0.30   BNP (last 3 results)  Recent Labs  06/24/12 0405 06/24/12 0551  PROBNP 1874.0* 1830.0*   CBG: No results found for this basename: GLUCAP,  in the last 168 hours  Recent Results (from the past 240 hour(s))  CULTURE, BLOOD (ROUTINE X 2)     Status: None   Collection Time    06/24/12  4:05 AM      Result Value Range Status   Specimen Description BLOOD LEFT ARM   Final   Special Requests BOTTLES DRAWN AEROBIC AND ANAEROBIC 4CC   Final   Culture  Setup Time 06/24/2012 08:30   Final   Culture     Final   Value: GRAM NEGATIVE RODS     ENTEROCOCCUS SPECIES     Note: Gram Stain Report Called to,Read Back By and Verified With: MONIQUE HOWLETT ON 06/24/2012 AT 11:16P BY WILEJ   Report Status PENDING   Incomplete  CULTURE, BLOOD (ROUTINE X 2)     Status: None   Collection Time     06/24/12  4:05 AM      Result Value Range Status   Specimen Description BLOOD RIGHT ANTECUBITAL   Final   Special Requests BOTTLES DRAWN AEROBIC AND ANAEROBIC 5CC   Final   Culture  Setup Time 06/24/2012 08:30   Final   Culture     Final   Value: ENTEROCOCCUS SPECIES     GRAM NEGATIVE RODS     Note: Gram Stain Report Called to,Read Back By and Verified With: MONIQUE HOWLETT ON 06/24/2012 AT 11:16P   Report Status PENDING   Incomplete  MRSA PCR SCREENING     Status: None   Collection Time    06/24/12  4:52 AM      Result Value Range Status   MRSA by PCR NEGATIVE  NEGATIVE Final   Comment:            The GeneXpert MRSA Assay (FDA     approved for NASAL specimens     only), is one component of a     comprehensive MRSA colonization     surveillance program. It is not     intended to diagnose MRSA     infection nor to guide or     monitor treatment for     MRSA infections.     Studies: Dg Hip Operative Left  06/25/2012  *RADIOLOGY REPORT*  Clinical Data: Left hip intramedullary rod.  OPERATIVE LEFT HIP  Comparison: Radiographs 06/23/2012.  Findings: Three spot fluoroscopic images demonstrate the placement of a left hip dynamic screw and intramedullary rod.  This is secured by a distal interlocking screw.  The comminuted intertrochanteric femur fracture demonstrates near anatomic reduction.  No complications are identified.  IMPRESSION: Near anatomic reduction of the intertrochanteric femur fracture status post fixation.   Original Report Authenticated By: Carey Bullocks, M.D.    Dg Pelvis Portable  06/25/2012  *RADIOLOGY REPORT*  Clinical Data: Status post left nail  PORTABLE PELVIS  Comparison: 06/23/12  Findings: There has been interval open reduction and internal fixation of the comminuted intertrochanteric fracture of the left proximal femur.  Intramedullary rod and screw  device is in place. Hardware components and fracture fragments are in anatomic alignment.  IMPRESSION:  1.   Status post ORIF of left proximal femur fracture.   Original Report Authenticated By: Signa Kell, M.D.     Scheduled Meds: . aztreonam  1 g Intravenous Q8H  . clindamycin  900 mg Intravenous Once  . docusate sodium  100 mg Oral BID  . enoxaparin (LOVENOX) injection  40 mg Subcutaneous Q24H  . ferrous sulfate  325 mg Oral TID PC  . levofloxacin (LEVAQUIN) IV  750 mg Intravenous Q24H  . metoprolol tartrate  50 mg Oral BID  . pantoprazole  40 mg Oral Daily  . potassium chloride  40 mEq Oral BID  . tamsulosin  0.4 mg Oral QHS  . vancomycin  1,000 mg Intravenous Q8H   Continuous Infusions: . lactated ringers 50 mL/hr at 06/26/12 1610    Principal Problem:   Closed left hip fracture Active Problems:   CVA   Dementia   Fracture of left humerus   Leucocytosis    Time spent: 30 min    Steven [name]  [facility] Pager [phone] If 7PM-7AM, please contact night-coverage at [email or web address], password [facility] 06/26/2012, 1:35 PM  LOS: 3 days

## 2012-06-27 ENCOUNTER — Encounter (HOSPITAL_COMMUNITY): Payer: Self-pay | Admitting: Radiology

## 2012-06-27 ENCOUNTER — Inpatient Hospital Stay (HOSPITAL_COMMUNITY): Payer: Medicaid Other

## 2012-06-27 DIAGNOSIS — R7881 Bacteremia: Secondary | ICD-10-CM

## 2012-06-27 DIAGNOSIS — B952 Enterococcus as the cause of diseases classified elsewhere: Secondary | ICD-10-CM

## 2012-06-27 DIAGNOSIS — B961 Klebsiella pneumoniae [K. pneumoniae] as the cause of diseases classified elsewhere: Secondary | ICD-10-CM

## 2012-06-27 LAB — MAGNESIUM: Magnesium: 2.1 mg/dL (ref 1.5–2.5)

## 2012-06-27 LAB — CULTURE, BLOOD (ROUTINE X 2)

## 2012-06-27 LAB — BASIC METABOLIC PANEL
BUN: 17 mg/dL (ref 6–23)
Chloride: 98 mEq/L (ref 96–112)
GFR calc Af Amer: >90 mL/min (ref >90)
Potassium: 2.9 mEq/L — ABNORMAL LOW (ref 3.5–5.1)
Sodium: 132 mEq/L — ABNORMAL LOW (ref 135–145)

## 2012-06-27 LAB — VITAMIN D 1,25 DIHYDROXY
Vitamin D2 1, 25 (OH)2: <8 pg/mL
Vitamin D3 1, 25 (OH)2: 28 pg/mL

## 2012-06-27 LAB — CBC
HCT: 30.2 % — ABNORMAL LOW (ref 39.0–52.0)
RDW: 13.1 % (ref 11.5–15.5)
WBC: 13.1 10*3/uL — ABNORMAL HIGH (ref 4.0–10.5)

## 2012-06-27 MED ORDER — POTASSIUM CHLORIDE 10 MEQ/100ML IV SOLN
10.0000 meq | INTRAVENOUS | Status: AC
  Administered 2012-06-27 (×4): 10 meq via INTRAVENOUS
  Filled 2012-06-27 (×4): qty 100

## 2012-06-27 MED ORDER — IOHEXOL 300 MG/ML  SOLN
50.0000 mL | Freq: Once | INTRAMUSCULAR | Status: AC | PRN
  Administered 2012-06-27: 50 mL via ORAL

## 2012-06-27 MED ORDER — METRONIDAZOLE 500 MG PO TABS
500.0000 mg | ORAL_TABLET | Freq: Three times a day (TID) | ORAL | Status: DC
  Filled 2012-06-27 (×3): qty 1

## 2012-06-27 MED ORDER — LIDOCAINE HCL 2 % EX GEL
Freq: Once | CUTANEOUS | Status: AC
  Administered 2012-06-27: 5 via TOPICAL
  Filled 2012-06-27: qty 5

## 2012-06-27 MED ORDER — ENOXAPARIN SODIUM 40 MG/0.4ML ~~LOC~~ SOLN: 40.0000 mg | SUBCUTANEOUS | Status: DC

## 2012-06-27 MED ORDER — IOHEXOL 300 MG/ML  SOLN
100.0000 mL | Freq: Once | INTRAMUSCULAR | Status: AC | PRN
  Administered 2012-06-27: 100 mL via INTRAVENOUS

## 2012-06-27 MED ORDER — HYDROCODONE-ACETAMINOPHEN 5-325 MG PO TABS: 1.0000 | ORAL_TABLET | Freq: Four times a day (QID) | ORAL | Status: DC | PRN

## 2012-06-27 NOTE — Progress Notes (Signed)
   Subjective: 2 Days Post-Op Procedure(s) (LRB): INTRAMEDULLARY (IM) NAIL FEMORAL (Left)   Patient reports pain as mild, pain well controlled. No events throughout the night.   Objective:   VITALS:   Filed Vitals:   06/27/12 0529  BP: 121/77  Pulse: 111  Temp: 99.1 F (37.3 C)  Resp: 18    Neurovascular intact Dorsiflexion/Plantar flexion intact Incision: dressing C/D/I No cellulitis present Compartment soft  LABS  Recent Labs  06/25/12 0949 06/26/12 0533 06/27/12 0520  HGB 11.6* 10.8* 10.3*  HCT 33.2* 30.6* 30.2*  WBC 13.5* 13.0* 13.1*  PLT 107* 116* 141*     Recent Labs  06/26/12 0533 06/27/12 0520  NA 131* 132*  K 3.2* 2.9*  BUN 22 17  CREATININE 0.67 0.63  GLUCOSE 125* 138*     Assessment/Plan: 2 Days Post-Op Procedure(s) (LRB): INTRAMEDULLARY (IM) NAIL FEMORAL (Left) Up with therapy Discharge to SNF eventually, when ready medically. Orthopaedically stable 50% weight bearing left leg Lovenox for anticoagulation, Rx on chart Hydrocodone for pain management, Rx on chart Follow up in 2 weeks at [facility]. Follow up with OLIN,Jrue Jarriel D in 2 weeks.  Contact information:  [facility] [address], [address] [place] [address] [phone]       Steven [name]. Steven [name]   PAC  06/27/2012, 8:49 AM

## 2012-06-27 NOTE — Progress Notes (Signed)
I have attempted at great length to assist pt. With drinking his CT contrast--he is unwilling to drink more than a sip or two at a time; and ultimately refuses any more.  Dr. Blake Divine notified--orders received; and contrast is given through a #16 fr. NG tube inserted without difficulty via right nare.  NG d/c'd. Immediately after contrast instilled after placement confirmed with auscultation.  He tolerated this procedure quite well and without emesis or cough.

## 2012-06-27 NOTE — Progress Notes (Signed)
[facility] PROGRESS NOTE  Steven [name] ZOX:[id number] DOB: 01-29-1951 DOA: 06/23/2012 PCP: No primary provider on file.  Assessment/Plan:                                                                                                                1. Left femoral intertrochanteric fracture -  Orthopedics on board. Underwent surgical repair. PT/OT evaluation.  2.  Sinus tachycardia - not sure what's causing it.  Improved. May be pain related or due to dehydration.  CT angiogram of the chest to rule out PE was negative. Free t4 and TSH within normal limits. Echocardiogram showed good LVEF without any wall abn., but with polymicrobial bacteremia, worth getting  A TEE.  3. History of CVA with left-sided hemiparesis - presently holding of Plavix forsurgery of the left hip. Will restart plavix if ortho agrees.  4. Dementia - no acute issues. 5. Chronic left humerus fracture. 6. Leukocytosis - patient is afebrile. Chest x-ray and UA are unremarkable except the chest x-ray showing mild congestion.  Repeat CBC shows improvement in leukocytosis.  7. Positive Blood cultures showing gram positive cocci and gram negative rods/ polymicrobial bacteremia:  Patient is on aztreonam, vancomycin and levaquin , but ID consulted for further recommendations.  8. DVT prophylaxis.  Code Status: full code Family Communication: none at bedside Disposition Plan: pending PT eval.    Consultants:  Cardiology  orthopedics  HPI/Subjective:  alert but confused.  Objective: Filed Vitals:   06/26/12 2023 06/27/12 0245 06/27/12 0529 06/27/12 1014  BP: 98/66 114/70 121/77 126/82  Pulse: 112 112 111 119  Temp: 98.8 F (37.1 C) 99.7 F (37.6 C) 99.1 F (37.3 C) 99.2 F (37.3 C)  TempSrc: Oral Oral Oral Oral  Resp: 16 18 18 18   Height:      Weight:      SpO2: 90% 92% 90% 92%    Intake/Output Summary (Last 24 hours) at 06/27/12 1437 Last data filed at 06/27/12 1100  Gross per 24 hour  Intake    340  ml  Output      0 ml  Net    340 ml   Filed Weights   06/24/12 0837  Weight: 64.411 kg (142 lb)    Exam:  General: Well-developed well-nourished.   Cardiovascular: S1-S2 heard tachycardic.  Respiratory: No rhonchi or crepitations. A bdomen: Soft nontender bowel sounds present.   Musculoskeletal: Pain on moving left shoulder and left leg.  Marland Kitchen Neurologic: Has left-sided weakness from old stroke  Data Reviewed: Basic Metabolic Panel:  Recent Labs Lab 06/23/12 2341 06/24/12 0405 06/26/12 0533 06/27/12 0520  NA 136 135 131* 132*  K 4.0 3.7 3.2* 2.9*  CL 97 100 98 98  CO2 20 20 26 26   GLUCOSE 144* 129* 125* 138*  BUN 19 17 22 17   CREATININE 0.74 0.66 0.67 0.63  CALCIUM 9.3 8.4 7.7* 7.8*  MG  --   --   --  2.1   Liver Function Tests:  Recent Labs Lab 06/24/12 0405  ALBUMIN 3.1*   No results found for this basename: LIPASE, AMYLASE,  in the last 168 hours No results found for this basename: AMMONIA,  in the last 168 hours CBC:  Recent Labs Lab 06/23/12 2341 06/24/12 0405 06/25/12 0949 06/26/12 0533 06/27/12 0520  WBC 20.7* 17.0* 13.5* 13.0* 13.1*  NEUTROABS 18.2*  --   --   --   --   HGB 17.2* 15.6 11.6* 10.8* 10.3*  HCT 47.7 44.0 33.2* 30.6* 30.2*  MCV 81.4 82.1 83.8 82.9 83.4  PLT 193 147* 107* 116* 141*   Cardiac Enzymes:  Recent Labs Lab 06/23/12 2341 06/24/12 0405 06/25/12 0950  TROPONINI <0.30 <0.30 <0.30   BNP (last 3 results)  Recent Labs  06/24/12 0405 06/24/12 0551  PROBNP 1874.0* 1830.0*   CBG: No results found for this basename: GLUCAP,  in the last 168 hours  Recent Results (from the past 240 hour(s))  CULTURE, BLOOD (ROUTINE X 2)     Status: None   Collection Time    06/24/12  4:05 AM      Result Value Range Status   Specimen Description BLOOD LEFT ARM   Final   Special Requests BOTTLES DRAWN AEROBIC AND ANAEROBIC 4CC   Final   Culture  Setup Time 06/24/2012 08:30   Final   Culture     Final   Value: GRAM NEGATIVE RODS      ENTEROCOCCUS SPECIES     Note: Gram Stain Report Called to,Read Back By and Verified With: MONIQUE HOWLETT ON 06/24/2012 AT 11:16P BY WILEJ   Report Status 06/27/2012 FINAL   Final  CULTURE, BLOOD (ROUTINE X 2)     Status: None   Collection Time    06/24/12  4:05 AM      Result Value Range Status   Specimen Description BLOOD RIGHT ANTECUBITAL   Final   Special Requests BOTTLES DRAWN AEROBIC AND ANAEROBIC 5CC   Final   Culture  Setup Time 06/24/2012 08:30   Final   Culture     Final   Value: ENTEROCOCCUS SPECIES     Note: COMBINATION THERAPY OF HIGH DOSE AMPICILLIN OR VANCOMYCIN, PLUS AN AMINOGLYCOSIDE, IS USUALLY INDICATED FOR SERIOUS ENTEROCOCCAL INFECTIONS.     KLEBSIELLA PNEUMONIAE     Note: Gram Stain Report Called to,Read Back By and Verified With: MONIQUE HOWLETT ON 06/24/2012 AT 11:16P   Report Status 06/27/2012 FINAL   Final   Organism ID, Bacteria ENTEROCOCCUS SPECIES   Final   Organism ID, Bacteria KLEBSIELLA PNEUMONIAE   Final  MRSA PCR SCREENING     Status: None   Collection Time    06/24/12  4:52 AM      Result Value Range Status   MRSA by PCR NEGATIVE  NEGATIVE Final   Comment:            The GeneXpert MRSA Assay (FDA     approved for NASAL specimens     only), is one component of a     comprehensive MRSA colonization     surveillance program. It is not     intended to diagnose MRSA     infection nor to guide or     monitor treatment for     MRSA infections.     Studies: No results found.  Scheduled Meds: . docusate sodium  100 mg Oral BID  . enoxaparin (LOVENOX) injection  40 mg Subcutaneous Q24H  . ferrous sulfate  325 mg Oral TID PC  . metoprolol tartrate  50 mg Oral BID  . pantoprazole  40 mg Oral Daily  . tamsulosin  0.4 mg Oral QHS   Continuous Infusions: . lactated ringers 50 mL/hr at 06/26/12 1610    Principal Problem:   Closed left hip fracture Active Problems:   CVA   Dementia   Fracture of left humerus   Leucocytosis   Positive blood  cultures    Time spent: 30 min    Clarinda Obi  [facility] Pager [phone] If 7PM-7AM, please contact night-coverage at [email or web address], password [facility] 06/27/2012, 2:37 PM  LOS: 4 days

## 2012-06-27 NOTE — Progress Notes (Signed)
ANTIBIOTIC CONSULT NOTE - INITIAL  Pharmacy Consult for Primaxin  Indication: Bacteremia   Allergies  Allergen Reactions  . Cefazolin Rash  . Penicillins     REACTION: unknown allergy - hx since childhood    Patient Measurements: Height: 5' 1.81" (157 cm) Weight: 142 lb (64.411 kg) IBW/kg (Calculated) : 54.17   Vital Signs: Temp: 99.2 F (37.3 C) (04/20 1014) Temp src: Oral (04/20 1014) BP: 126/82 mmHg (04/20 1014) Pulse Rate: 119 (04/20 1014) Intake/Output from previous day: 04/19 0701 - 04/20 0700 In: 220 [P.O.:180; I.V.:40] Out: -  Intake/Output from this shift: Total I/O In: 120 [P.O.:120] Out: -   Labs:  Recent Labs  06/25/12 0949 06/26/12 0533 06/27/12 0520  WBC 13.5* 13.0* 13.1*  HGB 11.6* 10.8* 10.3*  PLT 107* 116* 141*  CREATININE  --  0.67 0.63   Estimated Creatinine Clearance: 74.3 ml/min (by C-G formula based on Cr of 0.63).  Recent Labs  06/26/12 1000  VANCOTROUGH <5.0*     Microbiology: Recent Results (from the past 720 hour(s))  CULTURE, BLOOD (ROUTINE X 2)     Status: None   Collection Time    06/24/12  4:05 AM      Result Value Range Status   Specimen Description BLOOD LEFT ARM   Final   Special Requests BOTTLES DRAWN AEROBIC AND ANAEROBIC 4CC   Final   Culture  Setup Time 06/24/2012 08:30   Final   Culture     Final   Value: GRAM NEGATIVE RODS     ENTEROCOCCUS SPECIES     Note: Gram Stain Report Called to,Read Back By and Verified With: MONIQUE HOWLETT ON 06/24/2012 AT 11:16P BY WILEJ   Report Status 06/27/2012 FINAL   Final  CULTURE, BLOOD (ROUTINE X 2)     Status: None   Collection Time    06/24/12  4:05 AM      Result Value Range Status   Specimen Description BLOOD RIGHT ANTECUBITAL   Final   Special Requests BOTTLES DRAWN AEROBIC AND ANAEROBIC 5CC   Final   Culture  Setup Time 06/24/2012 08:30   Final   Culture     Final   Value: ENTEROCOCCUS SPECIES     Note: COMBINATION THERAPY OF HIGH DOSE AMPICILLIN OR VANCOMYCIN,  PLUS AN AMINOGLYCOSIDE, IS USUALLY INDICATED FOR SERIOUS ENTEROCOCCAL INFECTIONS.     KLEBSIELLA PNEUMONIAE     Note: Gram Stain Report Called to,Read Back By and Verified With: MONIQUE HOWLETT ON 06/24/2012 AT 11:16P   Report Status 06/27/2012 FINAL   Final   Organism ID, Bacteria ENTEROCOCCUS SPECIES   Final   Organism ID, Bacteria KLEBSIELLA PNEUMONIAE   Final  MRSA PCR SCREENING     Status: None   Collection Time    06/24/12  4:52 AM      Result Value Range Status   MRSA by PCR NEGATIVE  NEGATIVE Final   Comment:            The GeneXpert MRSA Assay (FDA     approved for NASAL specimens     only), is one component of a     comprehensive MRSA colonization     surveillance program. It is not     intended to diagnose MRSA     infection nor to guide or     monitor treatment for     MRSA infections.    Medical History: Past Medical History  Diagnosis Date  . Lumbar spondylitis   . Anxiety   .  Urinary incontinence   . GERD (gastroesophageal reflux disease)   . Stroke   . Chronic pain   . Hypertonicity of bladder   . Fracture of left humerus     Medications:  Scheduled:  . docusate sodium  100 mg Oral BID  . enoxaparin (LOVENOX) injection  40 mg Subcutaneous Q24H  . ferrous sulfate  325 mg Oral TID PC  . metoprolol tartrate  50 mg Oral BID  . pantoprazole  40 mg Oral Daily  . potassium chloride  10 mEq Intravenous Q1 Hr x 4  . [COMPLETED] potassium chloride  40 mEq Oral BID  . tamsulosin  0.4 mg Oral QHS  . [COMPLETED] vancomycin  1,000 mg Intravenous Once  . [DISCONTINUED] aztreonam  1 g Intravenous Q8H  . [DISCONTINUED] clindamycin  900 mg Intravenous Once  . [DISCONTINUED] levofloxacin (LEVAQUIN) IV  750 mg Intravenous Q24H  . [DISCONTINUED] metroNIDAZOLE  500 mg Oral Q8H  . [DISCONTINUED] vancomycin  1,000 mg Intravenous Q8H   Infusions:  . lactated ringers 50 mL/hr at 06/26/12 0905   PRN: acetaminophen, acetaminophen, fentaNYL, HYDROcodone-acetaminophen,  iohexol, [COMPLETED] iohexol, menthol-cetylpyridinium, meperidine (DEMEROL) injection, metoCLOPramide (REGLAN) injection, metoCLOPramide, morphine injection, ondansetron (ZOFRAN) IV, ondansetron, phenol, polyethylene glycol, promethazine Assessment:  62 yo M with enterococcus/klebsiella bacteremia from 4/17 blood cultures.  Patient was on Vanco/Levaquin/Aztreonam from 4/17-4/20.  Cultures sensitive to ampicillin, however patient has a PCN and Ancef allergy listed.  Per ID, narrow to primaxin today.    WBC remains slightly elevated 13.1, but improved   Scr remains WNL with CrCl 75 ml/min   Repeat blood cultures sent   2D Echo from 4/18 negative for vegitation   Goal of Therapy:  Primaxin per renal function   Plan:  1.) Primaxin 500 mg IV q8h 2.) Monitor renal function  3.) will f/u with ID  Christopherjohn Schiele, Loma Messing PharmD Pager #: [phone] 12:51 PM 06/27/2012

## 2012-06-27 NOTE — Progress Notes (Signed)
Subjective:  Lying in bed but no complaints of shortness of breath. Does have positive blood cultures. Has a persistent sinus tachycardia.  Objective:  Vital Signs in the last 24 hours: BP 121/77  Pulse 111  Temp(Src) 99.1 F (37.3 C) (Oral)  Resp 18  Ht 5' 1.81" (1.57 m)  Wt 64.411 kg (142 lb)  BMI 26.13 kg/m2  SpO2 90%  Physical Exam: Middle aged white male lying in bed was in no acute distress Lungs:  Clear Cardiac:  Rapid regular rhythm,  normal S1 and S2, no S3 Extremities:  No edema present left hand contracted  Intake/Output from previous day: 04/19 0701 - 04/20 0700 In: 220 [P.O.:180; I.V.:40] Out: -  Weight Filed Weights   06/24/12 0837  Weight: 64.411 kg (142 lb)    Lab Results: Basic Metabolic Panel:  Recent Labs  16/10/96 0533 06/27/12 0520  NA 131* 132*  K 3.2* 2.9*  CL 98 98  CO2 26 26  GLUCOSE 125* 138*  BUN 22 17  CREATININE 0.67 0.63    CBC:  Recent Labs  06/26/12 0533 06/27/12 0520  WBC 13.0* 13.1*  HGB 10.8* 10.3*  HCT 30.6* 30.2*  MCV 82.9 83.4  PLT 116* 141*    BNP    Component Value Date/Time   PROBNP 1830.0* 06/24/2012 0551    Telemetry: Sinus tachycardia  ECHO  Preserved LV function EF 60%  Assessment/Plan:  1. Persistent sinus tachycardia with occasional PVCs 2. Dementia 3. ?history of CAD 4. Gram-negative sepsis  Rec:  Continue to treat infection. Beta blockers were increased yesterday. Continue to watch.   Darden Palmer  MD [facility]  06/27/2012, 8:43 AM

## 2012-06-27 NOTE — Consult Note (Signed)
[facility]    Date of Admission:  06/23/2012  Date of Consult:  06/27/2012  Reason for Consult: Polymicrobial bacteremia Referring Physician: Dr. Blake Divine   HPI: Steven [name] is an 62 y.o. male with past medical history significant for dementia lumbar spondylitis prior stroke who was admitted to [facility] long after he had a unwitnessed fall and sustained a fracture of his left humerus. Noted was a long and blood cultures were drawn which now are growing both ampicillin sensitive enterococcus as well as Klebsiella pneumonia species in 2 out of 2 blood cultures. On admission he had had a urinalysis performed which showed 3-6 white blood cells, but no urine culture was done and suspicion was low for urinary tract infection. We were consulted to assist in the management work up of this patient with polymicrobial bacteremia. The patient himself as far centerline upon denies he has never had any intra-abdominal surgeries. He denies abdominal pain dysuria or flank pain. We're endeavoring to get a CT scan of the abdomen and pelvis with oral and IV contrast to exclude intra-abdominal abscess. He is apparently allergic to penicillin but the reaction is unknown I found that he is allergic to cefazolin with documentation of a rash in e- chart.      Past Medical History  Diagnosis Date  . Lumbar spondylitis   . Anxiety   . Urinary incontinence   . GERD (gastroesophageal reflux disease)   . Stroke   . Chronic pain   . Hypertonicity of bladder   . Fracture of left humerus     History reviewed. No pertinent past surgical history.ergies:   Allergies  Allergen Reactions  . Cefazolin Rash  . Penicillins     REACTION: unknown allergy - hx since childhood     Medications: I have reviewed patients current medications as documented in Epic Anti-infectives   Start     Dose/Rate Route Frequency Ordered Stop   06/27/12 1400  metroNIDAZOLE (FLAGYL) tablet 500 mg  Status:   Discontinued     500 mg Oral 3 times per day 06/27/12 1032 06/27/12 1229   06/26/12 2000  vancomycin (VANCOCIN) IVPB 1000 mg/200 mL premix  Status:  Discontinued     1,000 mg 200 mL/hr over 60 Minutes Intravenous Every 8 hours 06/26/12 1200 06/27/12 1229   06/26/12 1215  vancomycin (VANCOCIN) IVPB 1000 mg/200 mL premix     1,000 mg 200 mL/hr over 60 Minutes Intravenous  Once 06/26/12 1203 06/26/12 1315   06/25/12 0800  levofloxacin (LEVAQUIN) IVPB 750 mg  Status:  Discontinued     750 mg 100 mL/hr over 90 Minutes Intravenous Every 24 hours 06/24/12 1014 06/27/12 1039   06/25/12 0700  clindamycin (CLEOCIN) IVPB 900 mg  Status:  Discontinued     900 mg 100 mL/hr over 30 Minutes Intravenous  Once 06/25/12 0912 06/27/12 1205   06/24/12 2200  vancomycin (VANCOCIN) IVPB 750 mg/150 ml premix  Status:  Discontinued     750 mg 150 mL/hr over 60 Minutes Intravenous Every 12 hours 06/24/12 1014 06/26/12 1200   06/24/12 1400  aztreonam (AZACTAM) 1 g in dextrose 5 % 50 mL IVPB  Status:  Discontinued     1 g 100 mL/hr over 30 Minutes Intravenous 3 times per day 06/24/12 1014 06/27/12 1229   06/24/12 0415  levofloxacin (LEVAQUIN) IVPB 750 mg     750 mg 100 mL/hr over 90 Minutes Intravenous  Once 06/24/12 0400 06/24/12 0557   06/24/12  0415  vancomycin (VANCOCIN) IVPB 1000 mg/200 mL premix     1,000 mg 200 mL/hr over 60 Minutes Intravenous  Once 06/24/12 0400 06/24/12 0654   06/24/12 0415  aztreonam (AZACTAM) 2 g in dextrose 5 % 50 mL IVPB     2 g 100 mL/hr over 30 Minutes Intravenous  Once 06/24/12 0409 06/24/12 1610      Social History:  reports that he has never smoked. He does not have any smokeless tobacco history on file. He reports that he does not drink alcohol or use illicit drugs.  Family History  Problem Relation Age of Onset  . Diabetes type II Mother   . Hypertension Mother   . Diabetes type II Father   . Hypertension Father   . Dementia Father     As in HPI and primary teams  notes otherwise 12 point review of systems is negative  Blood pressure 126/82, pulse 119, temperature 99.2 F (37.3 C), temperature source Oral, resp. rate 18, height 5' 1.81" (1.57 m), weight 142 lb (64.411 kg), SpO2 92.00%. General: Alert and awake,  HEENT: anicteric sclera, pupils reactive to light and accommodation, EOMI, oropharynx clear and without exudate CVS regular rate, normal r,  no murmur rubs or gallops Chest: clear to auscultation bilaterally, no wheezing, rales or rhonchi Abdomen: soft nontender, nondistended, normal bowel sounds, Extremities:he has bandage right site Skin: no rashes Neuro: nonfocal, strength and sensation intact   Results for orders placed during the hospital encounter of 06/23/12 (from the past 48 hour(s))  CBC     Status: Abnormal   Collection Time    06/26/12  5:33 AM      Result Value Range   WBC 13.0 (*) 4.0 - 10.5 K/uL   RBC 3.69 (*) 4.22 - 5.81 MIL/uL   Hemoglobin 10.8 (*) 13.0 - 17.0 g/dL   HCT 96.0 (*) 45.4 - 09.8 %   MCV 82.9  78.0 - 100.0 fL   MCH 29.3  26.0 - 34.0 pg   MCHC 35.3  30.0 - 36.0 g/dL   RDW 11.9  14.7 - 82.9 %   Platelets 116 (*) 150 - 400 K/uL   Comment: CONSISTENT WITH PREVIOUS RESULT  BASIC METABOLIC PANEL     Status: Abnormal   Collection Time    06/26/12  5:33 AM      Result Value Range   Sodium 131 (*) 135 - 145 mEq/L   Potassium 3.2 (*) 3.5 - 5.1 mEq/L   Chloride 98  96 - 112 mEq/L   CO2 26  19 - 32 mEq/L   Glucose, Bld 125 (*) 70 - 99 mg/dL   BUN 22  6 - 23 mg/dL   Creatinine, Ser 5.62  0.50 - 1.35 mg/dL   Calcium 7.7 (*) 8.4 - 10.5 mg/dL   GFR calc non Af Amer >90  >90 mL/min   GFR calc Af Amer >90  >90 mL/min   Comment:            The eGFR has been calculated     using the CKD EPI equation.     This calculation has not been     validated in all clinical     situations.     eGFR's persistently     <90 mL/min signify     possible Chronic Kidney Disease.  VANCOMYCIN, TROUGH     Status: Abnormal    Collection Time    06/26/12 10:00 AM      Result Value Range  Vancomycin Tr <5.0 (*) 10.0 - 20.0 ug/mL  CBC     Status: Abnormal   Collection Time    06/27/12  5:20 AM      Result Value Range   WBC 13.1 (*) 4.0 - 10.5 K/uL   RBC 3.62 (*) 4.22 - 5.81 MIL/uL   Hemoglobin 10.3 (*) 13.0 - 17.0 g/dL   HCT 16.1 (*) 09.6 - 04.5 %   MCV 83.4  78.0 - 100.0 fL   MCH 28.5  26.0 - 34.0 pg   MCHC 34.1  30.0 - 36.0 g/dL   RDW 40.9  81.1 - 91.4 %   Platelets 141 (*) 150 - 400 K/uL  BASIC METABOLIC PANEL     Status: Abnormal   Collection Time    06/27/12  5:20 AM      Result Value Range   Sodium 132 (*) 135 - 145 mEq/L   Potassium 2.9 (*) 3.5 - 5.1 mEq/L   Chloride 98  96 - 112 mEq/L   CO2 26  19 - 32 mEq/L   Glucose, Bld 138 (*) 70 - 99 mg/dL   BUN 17  6 - 23 mg/dL   Creatinine, Ser 7.82  0.50 - 1.35 mg/dL   Calcium 7.8 (*) 8.4 - 10.5 mg/dL   GFR calc non Af Amer >90  >90 mL/min   GFR calc Af Amer >90  >90 mL/min   Comment:            The eGFR has been calculated     using the CKD EPI equation.     This calculation has not been     validated in all clinical     situations.     eGFR's persistently     <90 mL/min signify     possible Chronic Kidney Disease.  MAGNESIUM     Status: None   Collection Time    06/27/12  5:20 AM      Result Value Range   Magnesium 2.1  1.5 - 2.5 mg/dL      Component Value Date/Time   SDES BLOOD LEFT ARM 06/24/2012 0405   SDES BLOOD RIGHT ANTECUBITAL 06/24/2012 0405   SPECREQUEST BOTTLES DRAWN AEROBIC AND ANAEROBIC 4CC 06/24/2012 0405   SPECREQUEST BOTTLES DRAWN AEROBIC AND ANAEROBIC 5CC 06/24/2012 0405   CULT  Value: GRAM NEGATIVE RODS ENTEROCOCCUS SPECIES Note: Gram Stain Report Called to,Read Back By and Verified With: MONIQUE HOWLETT ON 06/24/2012 AT 11:16P BY Serafina Mitchell 06/24/2012 0405   CULT  Value: ENTEROCOCCUS SPECIES Note: COMBINATION THERAPY OF HIGH DOSE AMPICILLIN OR VANCOMYCIN, PLUS AN AMINOGLYCOSIDE, IS USUALLY INDICATED FOR SERIOUS ENTEROCOCCAL  INFECTIONS. KLEBSIELLA PNEUMONIAE Note: Gram Stain Report Called to,Read Back By and Verified With: MONIQUE HOWLETT ON 06/24/2012 AT 11:16P 06/24/2012 0405   REPTSTATUS 06/27/2012 FINAL 06/24/2012 0405   REPTSTATUS 06/27/2012 FINAL 06/24/2012 0405   No results found.   Recent Results (from the past 720 hour(s))  CULTURE, BLOOD (ROUTINE X 2)     Status: None   Collection Time    06/24/12  4:05 AM      Result Value Range Status   Specimen Description BLOOD LEFT ARM   Final   Special Requests BOTTLES DRAWN AEROBIC AND ANAEROBIC 4CC   Final   Culture  Setup Time 06/24/2012 08:30   Final   Culture     Final   Value: GRAM NEGATIVE RODS     ENTEROCOCCUS SPECIES     Note: Gram Stain Report Called to,Read Back By and Verified  With: MONIQUE HOWLETT ON 06/24/2012 AT 11:16P BY WILEJ   Report Status 06/27/2012 FINAL   Final  CULTURE, BLOOD (ROUTINE X 2)     Status: None   Collection Time    06/24/12  4:05 AM      Result Value Range Status   Specimen Description BLOOD RIGHT ANTECUBITAL   Final   Special Requests BOTTLES DRAWN AEROBIC AND ANAEROBIC 5CC   Final   Culture  Setup Time 06/24/2012 08:30   Final   Culture     Final   Value: ENTEROCOCCUS SPECIES     Note: COMBINATION THERAPY OF HIGH DOSE AMPICILLIN OR VANCOMYCIN, PLUS AN AMINOGLYCOSIDE, IS USUALLY INDICATED FOR SERIOUS ENTEROCOCCAL INFECTIONS.     KLEBSIELLA PNEUMONIAE     Note: Gram Stain Report Called to,Read Back By and Verified With: MONIQUE HOWLETT ON 06/24/2012 AT 11:16P   Report Status 06/27/2012 FINAL   Final   Organism ID, Bacteria ENTEROCOCCUS SPECIES   Final   Organism ID, Bacteria KLEBSIELLA PNEUMONIAE   Final  MRSA PCR SCREENING     Status: None   Collection Time    06/24/12  4:52 AM      Result Value Range Status   MRSA by PCR NEGATIVE  NEGATIVE Final   Comment:            The GeneXpert MRSA Assay (FDA     approved for NASAL specimens     only), is one component of a     comprehensive MRSA colonization     surveillance  program. It is not     intended to diagnose MRSA     infection nor to guide or     monitor treatment for     MRSA infections.     Impression/Recommendation  62 year old man who has dementia sustained a fall with an intertrochanteric fracture status post ORIF by Dr. Charlann Boxer on the 17th. His blood cultures on admission have grown both ampicillin sensitive enterococcus along with Klebsiella pneumonia species in 2 of 2 blood cultures.  #1 Polymicrobial bacteremia: not clear what source is here. Urine was not cultured on admission. I will re-culture it now. I think it is essential to get CT abdomen and pelvis  --reculture urine --CT abdomen and pelvis --2d echo, consideration for TEE --I will simplify to Imipenem (he had reash to Ancef, ? Rxn to PCN) there is chance for cross rxn but given nature of his ancef alergy worth try with a carbapenem  Thank you so much for this interesting consult  [facility] [phone] (pager) [phone] (office) 06/27/2012, 1:52 PM  Paulette Blanch Dam 06/27/2012, 1:52 PM

## 2012-06-28 ENCOUNTER — Encounter (HOSPITAL_COMMUNITY): Payer: Self-pay | Admitting: Orthopedic Surgery

## 2012-06-28 ENCOUNTER — Encounter: Payer: Self-pay | Admitting: Emergency Medicine

## 2012-06-28 DIAGNOSIS — R7881 Bacteremia: Secondary | ICD-10-CM | POA: Diagnosis present

## 2012-06-28 LAB — COMPREHENSIVE METABOLIC PANEL
ALT: 41 U/L (ref 0–53)
AST: 34 U/L (ref 0–37)
CO2: 24 mEq/L (ref 19–32)
Chloride: 100 mEq/L (ref 96–112)
GFR calc Af Amer: >90 mL/min (ref >90)
GFR calc non Af Amer: >90 mL/min (ref >90)
Glucose, Bld: 130 mg/dL — ABNORMAL HIGH (ref 70–99)
Sodium: 134 mEq/L — ABNORMAL LOW (ref 135–145)
Total Bilirubin: 0.6 mg/dL (ref 0.3–1.2)

## 2012-06-28 LAB — BASIC METABOLIC PANEL
CO2: 25 mEq/L (ref 19–32)
Calcium: 7.5 mg/dL — ABNORMAL LOW (ref 8.4–10.5)
Chloride: 102 mEq/L (ref 96–112)
Creatinine, Ser: 0.62 mg/dL (ref 0.50–1.35)
Glucose, Bld: 108 mg/dL — ABNORMAL HIGH (ref 70–99)
Sodium: 133 mEq/L — ABNORMAL LOW (ref 135–145)

## 2012-06-28 LAB — MAGNESIUM: Magnesium: 2.3 mg/dL (ref 1.5–2.5)

## 2012-06-28 MED ORDER — SODIUM CHLORIDE 0.9 % IV SOLN
500.0000 mg | Freq: Three times a day (TID) | INTRAVENOUS | Status: DC
  Filled 2012-06-28 (×2): qty 500

## 2012-06-28 MED ORDER — SODIUM CHLORIDE 0.9 % IV SOLN
500.0000 mg | Freq: Three times a day (TID) | INTRAVENOUS | Status: DC
  Administered 2012-06-28 – 2012-07-05 (×21): 500 mg via INTRAVENOUS
  Filled 2012-06-28 (×25): qty 500

## 2012-06-28 MED ORDER — POTASSIUM CHLORIDE CRYS ER 20 MEQ PO TBCR
40.0000 meq | EXTENDED_RELEASE_TABLET | Freq: Two times a day (BID) | ORAL | Status: AC
  Administered 2012-06-28 – 2012-06-29 (×2): 40 meq via ORAL
  Filled 2012-06-28 (×2): qty 2

## 2012-06-28 MED ORDER — POTASSIUM CHLORIDE 10 MEQ/100ML IV SOLN
10.0000 meq | INTRAVENOUS | Status: AC
  Administered 2012-06-28 (×4): 10 meq via INTRAVENOUS
  Filled 2012-06-28 (×4): qty 100

## 2012-06-28 NOTE — Progress Notes (Signed)
Patient ID: Steven [name], male   DOB: [birth year], 62 y.o.   MRN: [id number]         [facility] for Infectious Disease    Date of Admission:  06/23/2012   Total days of antibiotics 3, but currently off antibiotics         Principal Problem:   Closed left hip fracture Active Problems:   CVA   Dementia   Fracture of left humerus   Leucocytosis   Bacteremia due to Enterococcus   Bacteremia due to Klebsiella pneumoniae   . docusate sodium  100 mg Oral BID  . enoxaparin (LOVENOX) injection  40 mg Subcutaneous Q24H  . ferrous sulfate  325 mg Oral TID PC  . imipenem-cilastatin  500 mg Intravenous Q8H  . metoprolol tartrate  50 mg Oral BID  . pantoprazole  40 mg Oral Daily  . tamsulosin  0.4 mg Oral QHS    Subjective: He is having pain in his left hip. He appears to deny any abdominal pain.  Objective: Temp:  [98.2 F (36.8 C)-99.4 F (37.4 C)] 98.8 F (37.1 C) (04/21 1451) Pulse Rate:  [102-112] 106 (04/21 1451) Resp:  [18-20] 20 (04/21 1451) BP: (116-139)/(68-86) 139/68 mmHg (04/21 1451) SpO2:  [92 %-95 %] 95 % (04/21 1451)  General: Alert Skin: No rash Lungs: Clear Cor: Regular S1-S2 no murmurs Abdomen: Soft and nontender without palpable masses   Lab Results Lab Results  Component Value Date   WBC 13.1* 06/27/2012   HGB 10.3* 06/27/2012   HCT 30.2* 06/27/2012   MCV 83.4 06/27/2012   PLT 141* 06/27/2012    Lab Results  Component Value Date   CREATININE 0.62 06/28/2012   BUN 16 06/28/2012   NA 133* 06/28/2012   K 2.9* 06/28/2012   CL 102 06/28/2012   CO2 25 06/28/2012    No results found for this basename: ALT, AST, GGT, ALKPHOS, BILITOT      Microbiology: Recent Results (from the past 240 hour(s))  CULTURE, BLOOD (ROUTINE X 2)     Status: None   Collection Time    06/24/12  4:05 AM      Result Value Range Status   Specimen Description BLOOD LEFT ARM   Final   Special Requests BOTTLES DRAWN AEROBIC AND ANAEROBIC 4CC   Final   Culture  Setup Time  06/24/2012 08:30   Final   Culture     Final   Value: GRAM NEGATIVE RODS     ENTEROCOCCUS SPECIES     Note: Gram Stain Report Called to,Read Back By and Verified With: MONIQUE HOWLETT ON 06/24/2012 AT 11:16P BY WILEJ   Report Status 06/27/2012 FINAL   Final  CULTURE, BLOOD (ROUTINE X 2)     Status: None   Collection Time    06/24/12  4:05 AM      Result Value Range Status   Specimen Description BLOOD RIGHT ANTECUBITAL   Final   Special Requests BOTTLES DRAWN AEROBIC AND ANAEROBIC 5CC   Final   Culture  Setup Time 06/24/2012 08:30   Final   Culture     Final   Value: ENTEROCOCCUS SPECIES     Note: COMBINATION THERAPY OF HIGH DOSE AMPICILLIN OR VANCOMYCIN, PLUS AN AMINOGLYCOSIDE, IS USUALLY INDICATED FOR SERIOUS ENTEROCOCCAL INFECTIONS.     KLEBSIELLA PNEUMONIAE     Note: Gram Stain Report Called to,Read Back By and Verified With: MONIQUE HOWLETT ON 06/24/2012 AT 11:16P   Report Status 06/27/2012 FINAL   Final  Organism ID, Bacteria ENTEROCOCCUS SPECIES   Final   Organism ID, Bacteria KLEBSIELLA PNEUMONIAE   Final  MRSA PCR SCREENING     Status: None   Collection Time    06/24/12  4:52 AM      Result Value Range Status   MRSA by PCR NEGATIVE  NEGATIVE Final   Comment:            The GeneXpert MRSA Assay (FDA     approved for NASAL specimens     only), is one component of a     comprehensive MRSA colonization     surveillance program. It is not     intended to diagnose MRSA     infection nor to guide or     monitor treatment for     MRSA infections.  CULTURE, BLOOD (ROUTINE X 2)     Status: None   Collection Time    06/27/12  1:01 PM      Result Value Range Status   Specimen Description BLOOD LEFT ARM   Final   Special Requests BOTTLES DRAWN AEROBIC ONLY 1CC   Final   Culture  Setup Time 06/27/2012 17:11   Final   Culture     Final   Value:        BLOOD CULTURE RECEIVED NO GROWTH TO DATE CULTURE WILL BE HELD FOR 5 DAYS BEFORE ISSUING A FINAL NEGATIVE REPORT   Report Status  PENDING   Incomplete  CULTURE, BLOOD (ROUTINE X 2)     Status: None   Collection Time    06/27/12  1:21 PM      Result Value Range Status   Specimen Description BLOOD LEFT HAND   Final   Special Requests BOTTLES DRAWN AEROBIC ONLY 1CC   Final   Culture  Setup Time 06/27/2012 17:10   Final   Culture     Final   Value:        BLOOD CULTURE RECEIVED NO GROWTH TO DATE CULTURE WILL BE HELD FOR 5 DAYS BEFORE ISSUING A FINAL NEGATIVE REPORT   Report Status PENDING   Incomplete    Studies/Results: Ct Abdomen Pelvis W Contrast  06/27/2012  *RADIOLOGY REPORT*  Clinical Data: Evaluate for GI source of infection. Elevated white blood cell count.  Demented.  Gastroesophageal reflux disease.  No pain.  CT ABDOMEN AND PELVIS WITH CONTRAST  Technique:  Multidetector CT imaging of the abdomen and pelvis was performed following the standard protocol during bolus administration of intravenous contrast.  Contrast: OMNIPAQUE IOHEXOL 300 MG/ML  SOLN, 50mL OMNIPAQUE IOHEXOL 300 MG/ML  SOLN  Comparison: Chest CT 06/24/2012.  No prior abdominal imaging.  Findings: Lung bases:  Motion degradation throughout.  Right greater than left bibasilar airspace disease.  Mild cardiomegaly with coronary artery atherosclerosis.  Interval development of small bilateral pleural effusions.  Abdomen/pelvis:  Motion degradation continuing into the abdomen. Normal liver, spleen.  Tiny hiatal hernia.  Underdistended proximal stomach.  Mildly edematous transverse duodenum.  There is pancreatic and peripancreatic edema which is moderate in severity.  An area of ill defined gas in the region of the inferior pancreatic head and pancreatic uncinate process measures 3.2 cm on image 40/series 2.  There is adjacent filling defect consistent with nonocclusive thrombus as well as  air within the superior mesenteric vein.  Example images 33 - 40/series 2 and image 36/series 5.  Continues to the level of the splenoportal confluence, but no portal  venous air is seen.  Normal  gallbladder.  Minimal intrahepatic biliary ductal dilatation.  The common duct measures 1.0 cm on image 35/series 2. No definite obstructive stone is seen.  There is hyperattenuation in the region of the ampulla on image 38/series 2.  No peripancreatic fluid collections seen.  Normal adrenal glands and kidneys.  Age advanced aortic atherosclerosis without aneurysm.  Normal caliber of large and small bowel loops.  Small volume abdominal ascites.  Fat containing left inguinal hernia. No pelvic adenopathy.  Tiny amount of air within the nondependent urinary bladder. Normal prostate, without significant free pelvic fluid.  Trace fluid in the pelvis bilaterally.  Bones/Musculoskeletal:  Comminuted proximal left femoral fracture with intramedullary rod in place.  IMPRESSION:  1.  Moderate pancreatic and peripancreatic edema, most consistent with pancreatitis.  Ill defined gas "collection" in the region of the pancreatic head/uncinate process.  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  Differential considerations include extraluminal air from the adjacent duodenum.  Although a duodenal diverticulum could have this appearance, the presence of concurrent superior mesenteric vein thrombus and air argues for an infectious etiology. Biliary ductal dilatation.  Cannot exclude distal choledocholithiasis. If this is a concern, consider MRCP. This study was made a "call report". 2.  Small volume abdominal pelvic ascites. 3.  Development of small bilateral pleural effusions with adjacent collapse / consolidative change. 4.  Air within urinary bladder, likely iatrogenic.  Correlate with prior instrumentation.   Original Report Authenticated By: Jeronimo Greaves, M.D.     Assessment: He has polymicrobial bacteremia due to to enterococcus and Klebsiella. His CT scan shows what appears to be gas and inflammation in the pancreatic head which is the most obvious source for his infection. My  partner, Dr. Daiva Eves, consolidative his antibiotic therapy to imipenem yesterday but I am not sure he has received a dose she had. For some reason it was discontinued this morning by pharmacy that pharmacist and is no longer here today. A call was placed pharmacy to make sure that we started as soon as possible. We'll check his liver enzymes, amylase and lipase and suggested we get a formal GI evaluation.  Plan: 1. Restart imipenem 2. Check complete metabolic panel, amylase and lipase 3. Recommend GI evaluation in the morning  Cliffton Asters, MD [facility] for Infectious Disease [facility] [phone] pager   [phone] cell 06/28/2012, 4:32 PM

## 2012-06-28 NOTE — Progress Notes (Signed)
[facility] PROGRESS NOTE  Steven [name] ZOX:[id number] DOB: 1951/01/18 DOA: 06/23/2012 PCP: No primary provider on file.  Assessment/Plan:                                                                                                                1. Left femoral intertrochanteric fracture -  Orthopedics on board. Underwent surgical repair. PT/OT evaluation.  2.  Sinus tachycardia - not sure what's causing it.  Improved. May be pain related or due to dehydration.  CT angiogram of the chest to rule out PE was negative. Free t4 and TSH within normal limits. Echocardiogram showed good LVEF without any wall abn. 3. History of CVA with left-sided hemiparesis - presently holding of Plavix forsurgery of the left hip. Will restart plavix if ortho agrees.  4. Dementia - no acute issues. 5. Chronic left humerus fracture. 6. Leukocytosis - patient is afebrile. Chest x-ray and UA are unremarkable except the chest x-ray showing mild congestion.  Repeat CBC shows improvement in leukocytosis.  7. Positive Blood cultures showing gram positive cocci and gram negative rods/ polymicrobial bacteremia:   ID consulted for further recommendations. 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 Started him on primaxin.  8. DVT prophylaxis.  Code Status: full code Family Communication: none at bedside Disposition Plan: pending PT eval.    Consultants:  Cardiology  orthopedics  HPI/Subjective:  alert but confused.  Objective: Filed Vitals:   06/28/12 0622 06/28/12 0800 06/28/12 1200 06/28/12 1451  BP: 126/74   139/68  Pulse: 104   106  Temp: 99.4 F (37.4 C)   98.8 F (37.1 C)  TempSrc: Oral   Oral  Resp: 20 20 18 20   Height:      Weight:      SpO2: 92% 93% 92% 95%    Intake/Output Summary (Last 24 hours) at 06/28/12 1745 Last data filed at 06/27/12 2200  Gross per 24 hour  Intake    120 ml  Output      0 ml  Net    120 ml   Filed Weights   06/24/12 0837  Weight: 64.411 kg  (142 lb)    Exam:  General: Well-developed well-nourished.   Cardiovascular: S1-S2 heard tachycardic.  Respiratory: No rhonchi or crepitations. A bdomen: Soft nontender bowel sounds present.   Musculoskeletal: Pain on moving left shoulder and left leg.  Marland Kitchen Neurologic: Has left-sided weakness from old stroke  Data Reviewed: Basic Metabolic Panel:  Recent Labs Lab 06/23/12 2341 06/24/12 0405 06/26/12 0533 06/27/12 0520 06/28/12 0855  NA 136 135 131* 132* 133*  K 4.0 3.7 3.2* 2.9* 2.9*  CL 97 100 98 98 102  CO2 20 20 26 26 25   GLUCOSE 144* 129* 125* 138* 108*  BUN 19 17 22 17 16   CREATININE 0.74 0.66 0.67 0.63 0.62  CALCIUM 9.3 8.4 7.7* 7.8* 7.5*  MG  --   --   --  2.1 2.3   Liver Function Tests:  Recent Labs Lab 06/24/12 0405  ALBUMIN 3.1*  No results found for this basename: LIPASE, AMYLASE,  in the last 168 hours No results found for this basename: AMMONIA,  in the last 168 hours CBC:  Recent Labs Lab 06/23/12 2341 06/24/12 0405 06/25/12 0949 06/26/12 0533 06/27/12 0520  WBC 20.7* 17.0* 13.5* 13.0* 13.1*  NEUTROABS 18.2*  --   --   --   --   HGB 17.2* 15.6 11.6* 10.8* 10.3*  HCT 47.7 44.0 33.2* 30.6* 30.2*  MCV 81.4 82.1 83.8 82.9 83.4  PLT 193 147* 107* 116* 141*   Cardiac Enzymes:  Recent Labs Lab 06/23/12 2341 06/24/12 0405 06/25/12 0950  TROPONINI <0.30 <0.30 <0.30   BNP (last 3 results)  Recent Labs  06/24/12 0405 06/24/12 0551  PROBNP 1874.0* 1830.0*   CBG: No results found for this basename: GLUCAP,  in the last 168 hours  Recent Results (from the past 240 hour(s))  CULTURE, BLOOD (ROUTINE X 2)     Status: None   Collection Time    06/24/12  4:05 AM      Result Value Range Status   Specimen Description BLOOD LEFT ARM   Final   Special Requests BOTTLES DRAWN AEROBIC AND ANAEROBIC 4CC   Final   Culture  Setup Time 06/24/2012 08:30   Final   Culture     Final   Value: GRAM NEGATIVE RODS     ENTEROCOCCUS SPECIES     Note: Gram  Stain Report Called to,Read Back By and Verified With: MONIQUE HOWLETT ON 06/24/2012 AT 11:16P BY WILEJ   Report Status 06/27/2012 FINAL   Final  CULTURE, BLOOD (ROUTINE X 2)     Status: None   Collection Time    06/24/12  4:05 AM      Result Value Range Status   Specimen Description BLOOD RIGHT ANTECUBITAL   Final   Special Requests BOTTLES DRAWN AEROBIC AND ANAEROBIC 5CC   Final   Culture  Setup Time 06/24/2012 08:30   Final   Culture     Final   Value: ENTEROCOCCUS SPECIES     Note: COMBINATION THERAPY OF HIGH DOSE AMPICILLIN OR VANCOMYCIN, PLUS AN AMINOGLYCOSIDE, IS USUALLY INDICATED FOR SERIOUS ENTEROCOCCAL INFECTIONS.     KLEBSIELLA PNEUMONIAE     Note: Gram Stain Report Called to,Read Back By and Verified With: MONIQUE HOWLETT ON 06/24/2012 AT 11:16P   Report Status 06/27/2012 FINAL   Final   Organism ID, Bacteria ENTEROCOCCUS SPECIES   Final   Organism ID, Bacteria KLEBSIELLA PNEUMONIAE   Final  MRSA PCR SCREENING     Status: None   Collection Time    06/24/12  4:52 AM      Result Value Range Status   MRSA by PCR NEGATIVE  NEGATIVE Final   Comment:            The GeneXpert MRSA Assay (FDA     approved for NASAL specimens     only), is one component of a     comprehensive MRSA colonization     surveillance program. It is not     intended to diagnose MRSA     infection nor to guide or     monitor treatment for     MRSA infections.  CULTURE, BLOOD (ROUTINE X 2)     Status: None   Collection Time    06/27/12  1:01 PM      Result Value Range Status   Specimen Description BLOOD LEFT ARM   Final   Special Requests BOTTLES DRAWN  AEROBIC ONLY 1CC   Final   Culture  Setup Time 06/27/2012 17:11   Final   Culture     Final   Value:        BLOOD CULTURE RECEIVED NO GROWTH TO DATE CULTURE WILL BE HELD FOR 5 DAYS BEFORE ISSUING A FINAL NEGATIVE REPORT   Report Status PENDING   Incomplete  CULTURE, BLOOD (ROUTINE X 2)     Status: None   Collection Time    06/27/12  1:21 PM       Result Value Range Status   Specimen Description BLOOD LEFT HAND   Final   Special Requests BOTTLES DRAWN AEROBIC ONLY 1CC   Final   Culture  Setup Time 06/27/2012 17:10   Final   Culture     Final   Value:        BLOOD CULTURE RECEIVED NO GROWTH TO DATE CULTURE WILL BE HELD FOR 5 DAYS BEFORE ISSUING A FINAL NEGATIVE REPORT   Report Status PENDING   Incomplete     Studies: Ct Abdomen Pelvis W Contrast  06/27/2012  *RADIOLOGY REPORT*  Clinical Data: Evaluate for GI source of infection. Elevated white blood cell count.  Demented.  Gastroesophageal reflux disease.  No pain.  CT ABDOMEN AND PELVIS WITH CONTRAST  Technique:  Multidetector CT imaging of the abdomen and pelvis was performed following the standard protocol during bolus administration of intravenous contrast.  Contrast: OMNIPAQUE IOHEXOL 300 MG/ML  SOLN, 50mL OMNIPAQUE IOHEXOL 300 MG/ML  SOLN  Comparison: Chest CT 06/24/2012.  No prior abdominal imaging.  Findings: Lung bases:  Motion degradation throughout.  Right greater than left bibasilar airspace disease.  Mild cardiomegaly with coronary artery atherosclerosis.  Interval development of small bilateral pleural effusions.  Abdomen/pelvis:  Motion degradation continuing into the abdomen. Normal liver, spleen.  Tiny hiatal hernia.  Underdistended proximal stomach.  Mildly edematous transverse duodenum.  There is pancreatic and peripancreatic edema which is moderate in severity.  An area of ill defined gas in the region of the inferior pancreatic head and pancreatic uncinate process measures 3.2 cm on image 40/series 2.  There is adjacent filling defect consistent with nonocclusive thrombus as well as  air within the superior mesenteric vein.  Example images 33 - 40/series 2 and image 36/series 5.  Continues to the level of the splenoportal confluence, but no portal venous air is seen.  Normal gallbladder.  Minimal intrahepatic biliary ductal dilatation.  The common duct measures 1.0 cm on  image 35/series 2. No definite obstructive stone is seen.  There is hyperattenuation in the region of the ampulla on image 38/series 2.  No peripancreatic fluid collections seen.  Normal adrenal glands and kidneys.  Age advanced aortic atherosclerosis without aneurysm.  Normal caliber of large and small bowel loops.  Small volume abdominal ascites.  Fat containing left inguinal hernia. No pelvic adenopathy.  Tiny amount of air within the nondependent urinary bladder. Normal prostate, without significant free pelvic fluid.  Trace fluid in the pelvis bilaterally.  Bones/Musculoskeletal:  Comminuted proximal left femoral fracture with intramedullary rod in place.  IMPRESSION:  1.  Moderate pancreatic and peripancreatic edema, most consistent with pancreatitis.  Ill defined gas "collection" in the region of the pancreatic head/uncinate process.  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  Differential considerations include extraluminal air from the adjacent duodenum.  Although a duodenal diverticulum could have this appearance, the presence of concurrent superior mesenteric vein thrombus and air argues for an infectious  etiology. Biliary ductal dilatation.  Cannot exclude distal choledocholithiasis. If this is a concern, consider MRCP. This study was made a "call report". 2.  Small volume abdominal pelvic ascites. 3.  Development of small bilateral pleural effusions with adjacent collapse / consolidative change. 4.  Air within urinary bladder, likely iatrogenic.  Correlate with prior instrumentation.   Original Report Authenticated By: Jeronimo Greaves, M.D.     Scheduled Meds: . docusate sodium  100 mg Oral BID  . enoxaparin (LOVENOX) injection  40 mg Subcutaneous Q24H  . ferrous sulfate  325 mg Oral TID PC  . imipenem-cilastatin  500 mg Intravenous Q8H  . metoprolol tartrate  50 mg Oral BID  . pantoprazole  40 mg Oral Daily  . potassium chloride  40 mEq Oral BID  . tamsulosin  0.4 mg Oral QHS    Continuous Infusions: . lactated ringers 50 mL/hr at 06/26/12 6213    Principal Problem:   Closed left hip fracture Active Problems:   CVA   Dementia   Fracture of left humerus   Leucocytosis   Bacteremia due to Enterococcus   Bacteremia due to Klebsiella pneumoniae    Time spent: 30 min    Devesh Monforte  [facility] Pager [phone] If 7PM-7AM, please contact night-coverage at [email or web address], password [facility] 06/28/2012, 5:45 PM  LOS: 5 days

## 2012-06-28 NOTE — Progress Notes (Signed)
Subjective:  Lying in the recliner. No complaints of shortness of breath. Does have positive blood cultures. Has intermittent sinus tachycardia.  Difficult to get more than one word answers from him.  Objective:  Vital Signs in the last 24 hours: BP 126/74  Pulse 104  Temp(Src) 99.4 F (37.4 C) (Oral)  Resp 18  Ht 5' 1.81" (1.57 m)  Wt 64.411 kg (142 lb)  BMI 26.13 kg/m2  SpO2 92%  Physical Exam: Middle aged white male lying in a recliner  no acute distress  Lungs:  No wheezing Cardiac:  Rapid regular rhythm,  normal S1 and S2, no S3 Extremities:  No edema present left hand contracted  Intake/Output from previous day: 04/20 0701 - 04/21 0700 In: 240 [P.O.:240] Out: -  Weight Filed Weights   06/24/12 0837  Weight: 64.411 kg (142 lb)    Lab Results: Basic Metabolic Panel:  Recent Labs  16/10/96 0520 06/28/12 0855  NA 132* 133*  K 2.9* 2.9*  CL 98 102  CO2 26 25  GLUCOSE 138* 108*  BUN 17 16  CREATININE 0.63 0.62    CBC:  Recent Labs  06/26/12 0533 06/27/12 0520  WBC 13.0* 13.1*  HGB 10.8* 10.3*  HCT 30.6* 30.2*  MCV 82.9 83.4  PLT 116* 141*    BNP    Component Value Date/Time   PROBNP 1830.0* 06/24/2012 0551    Telemetry: Sinus tachycardia, PVCs  ECHO  Preserved LV function EF 60%  Assessment/Plan:  1. Persistent sinus tachycardia with occasional PVCs 2. Dementia 3. Gram-negative sepsis  Rec:  Continue to treat infection. Beta blockers were increased. Tachycardia improving.  Difficult to get any history from the patient.   Corky Crafts  MD [facility] Cardiology  06/28/2012, 1:49 PM

## 2012-06-28 NOTE — Progress Notes (Signed)
Physical Therapy Treatment Patient Details Name: Steven [name] MRN: [id number] DOB: 10-22-1950 Today's Date: 06/28/2012 Time: 8416-6063 PT Time Calculation (min): 25 min  PT Assessment / Plan / Recommendation Comments on Treatment Session  POD # 3 L ORIF 2nd fall/fx.  Pt resides at SNF and has L hemiparesis s/p CVA "a couple of years ago".  Assisted pt from supine to EOB.  Sat EOB at min assist X 4 min. Noted to have loose, watery stools so assisted to [facility].  Pt requires total assist for tranfers    Follow Up Recommendations  SNF     Does the patient have the potential to tolerate intense rehabilitation     Barriers to Discharge        Equipment Recommendations  None recommended by PT    Recommendations for Other Services    Frequency Min 3X/week   Plan      Precautions / Restrictions Precautions Precautions: Fall Precaution Comments: L hemiparesis s/p CVA Restrictions Weight Bearing Restrictions: Yes LLE Weight Bearing: Partial weight bearing LLE Partial Weight Bearing Percentage or Pounds: 50%   Pertinent Vitals/Pain No c/o pain I asked him if his hip hurt and pt stated "not really"    Mobility  Bed Mobility Bed Mobility: Supine to Sit Supine to Sit: 1: +2 Total assist Supine to Sit: Patient Percentage: 40% Details for Bed Mobility Assistance: Pt assisted with pad to turn in bed and bring LEs over side.  Pt voluntarily grasping rail and assisting self to upright sitting Transfers Transfers: Stand Pivot Transfers Stand Pivot Transfers: 1: +1 Total assist Details for Transfer Assistance: using "Bear Hug" stand pivot to pt's R from bed to [facility] then to recliner total assist +1 pt 40%     PT Goals                                                     progressing    Visit Information  Last PT Received On: 06/28/12 Assistance Needed: +2    Subjective Data      Cognition    impaired/delayed   Balance   sitting balance static..........Marland Kitchenpoor  End of Session PT -  End of Session Equipment Utilized During Treatment: Gait belt Activity Tolerance: Patient tolerated treatment well Patient left: in chair;with call bell/phone within reach Nurse Communication: Need for lift equipment;Mobility status   [name]  PTA [facility]  Acute  Rehab Pager      [phone]

## 2012-06-28 NOTE — Evaluation (Signed)
SLP Cancellation Note  Patient Details Name: Steven [name] MRN: [id number] DOB: [birth year]   Cancelled treatment:       Reason Eval/Treat Not Completed: Other (comment) (pt NPO for TEE today, will continue efforts to evaluate pt. )   Donavan Burnet, MS [facility] SLP [phone]

## 2012-06-28 NOTE — Progress Notes (Signed)
Patient ID: Steven [name], male   DOB: 27-Mar-1950, 62 y.o.   MRN: [id number] Subjective: 3 Days Post-Op Procedure(s) (LRB): INTRAMEDULLARY (IM) NAIL FEMORAL (Left)    Patient remains confused.  Does complain of being sore particularly around left hip  Objective:   VITALS:   Filed Vitals:   06/28/12 0622  BP: 126/74  Pulse: 104  Temp: 99.4 F (37.4 C)  Resp: 20    Incision: dressing C/D/I, no drainage.  Could have some soiling of outer part of dressing  LABS  Recent Labs  06/26/12 0533 06/27/12 0520  HGB 10.8* 10.3*  HCT 30.6* 30.2*  WBC 13.0* 13.1*  PLT 116* 141*     Recent Labs  06/26/12 0533 06/27/12 0520 06/28/12 0855  NA 131* 132* 133*  K 3.2* 2.9* 2.9*  BUN 22 17 16   CREATININE 0.67 0.63 0.62  GLUCOSE 125* 138* 108*    No results found for this basename: LABPT, INR,  in the last 72 hours   Assessment/Plan: 3 Days Post-Op Procedure(s) (LRB): INTRAMEDULLARY (IM) NAIL FEMORAL (Left)   Up with therapy Discharge to SNF once cleared

## 2012-06-29 ENCOUNTER — Encounter (HOSPITAL_COMMUNITY): Payer: Self-pay | Admitting: Physician Assistant

## 2012-06-29 DIAGNOSIS — R933 Abnormal findings on diagnostic imaging of other parts of digestive tract: Secondary | ICD-10-CM | POA: Clinically undetermined

## 2012-06-29 LAB — BASIC METABOLIC PANEL
BUN: 18 mg/dL (ref 6–23)
Calcium: 7.6 mg/dL — ABNORMAL LOW (ref 8.4–10.5)
Creatinine, Ser: 0.6 mg/dL (ref 0.50–1.35)
GFR calc non Af Amer: >90 mL/min (ref >90)
Glucose, Bld: 124 mg/dL — ABNORMAL HIGH (ref 70–99)

## 2012-06-29 LAB — CBC
HCT: 30.1 % — ABNORMAL LOW (ref 39.0–52.0)
MCH: 27.7 pg (ref 26.0–34.0)
MCHC: 33.6 g/dL (ref 30.0–36.0)
MCV: 82.5 fL (ref 78.0–100.0)
Platelets: 192 10*3/uL (ref 150–400)
RDW: 13.4 % (ref 11.5–15.5)

## 2012-06-29 LAB — MAGNESIUM: Magnesium: 2.4 mg/dL (ref 1.5–2.5)

## 2012-06-29 MED ORDER — CLOPIDOGREL BISULFATE 75 MG PO TABS
75.0000 mg | ORAL_TABLET | Freq: Every day | ORAL | Status: DC
  Administered 2012-06-29 – 2012-07-15 (×16): 75 mg via ORAL
  Filled 2012-06-29 (×21): qty 1

## 2012-06-29 MED ORDER — CLOPIDOGREL BISULFATE 75 MG PO TABS: 75.0000 mg | ORAL_TABLET | Freq: Every day | ORAL | Status: DC

## 2012-06-29 NOTE — Evaluation (Signed)
Clinical/Bedside Swallow Evaluation Patient Details  Name: Steven [name] MRN: [id number] Date of Birth: Jul 18, 1950  Today's Date: 06/29/2012 Time: 3086-5784 SLP Time Calculation (min): 40 min  Past Medical History:  Past Medical History  Diagnosis Date  . Lumbar spondylitis   . Anxiety   . Urinary incontinence   . GERD (gastroesophageal reflux disease)   . Stroke   . Chronic pain   . Hypertonicity of bladder   . Fracture of left humerus   . History of alcoholism   . Alcoholic hepatitis 2001  . 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   Marland Kitchen Epistaxis 2001    required cauterization  . Thrombus of left atrial appendage 2003   Past Surgical History:  Past Surgical History  Procedure Laterality Date  . Femur im nail Left 06/25/2012    Procedure: INTRAMEDULLARY (IM) NAIL FEMORAL;  Surgeon: Shelda Pal, MD;  Location: [facility];  Service: Orthopedics;  Laterality: Left;  . Knee surgery  1996  . Hand surgery Bilateral 2003    crush injuries with multitrauma   HPI:  Pt is a 62 yo male adm to [facility] after fall with left femoral fx required surgical repair.  PMH + for hand injury - crush, left humerus fx, GERD, dementia, and CVA.  Pt denies dysphagia, GI following and pt is scheduled for UGI tomorrow, ? necrotic pancreatitis.  Speech swallow evaluation ordered but pt unable to participate yesterday due to npo status for possible testing.  RN reports some difficulties with pt swallowing pills.     Assessment / Plan / Recommendation Clinical Impression  Pt presents with symptoms of moderate oropharyngeal dysphagia with concern for overt s/s of aspiration of solids requiring mastication and liquids via straw c/b overt strong coughing.  Voice was also wet/gurgly likely indicating penetration of liquids or secretions at least - cleared with cued cough/strong throat clear.  Decreased oral bolus control, cohesion noted with delayed swallow (suspect oral and pharyngeal delay) likely secondary to hx  of neurological event exacerbated by current medical illness- s/p fall/ surgery/ ongoing issues.    Pt tolerated pureed/thin via cup without overt clinical indications of aspiration.  Risk is present due to multifactorial dysphagia issues, weakness and decreased awareness.  Note CXR does not indicate acute event.  Pt denies worsening changes with swallow currently but demonstrates poor awareness/cognitive deficits present.   No family present.    Will modify diet to mitigate aspiration risk and follow for dysphagia management.  Thanks for the order.     Aspiration Risk       Diet Recommendation     Liquid Administration via: Cup;No straw Medication Administration: Crushed with puree (or whole with puree) Supervision: Full supervision/cueing for compensatory strategies;Patient able to self feed Compensations: Slow rate;Small sips/bites;Hard cough after swallow (cough if voice is gurgly) Postural Changes and/or Swallow Maneuvers: Upright 30-60 min after meal;Seated upright 90 degrees    Other  Recommendations Oral Care Recommendations: Oral care QID   Follow Up Recommendations  Skilled Nursing facility    Frequency and Duration min 2x/week  1 week   Pertinent Vitals/Pain Afebrile, decreased    SLP Swallow Goals Patient will utilize recommended strategies during swallow to increase swallowing safety with: Total assistance Goal #3: Pt's family or significant other will verbalize precautions to mitigate aspiration risk with mod assist.    Swallow Study Prior Functional Status   unknown    General Date of Onset: 06/29/12 HPI: Pt is a 62 yo male  adm to [facility] after fall with left femoral fx required surgical repair.  PMH + for hand injury - crush, left humerus fx, GERD, dementia, and CVA.  Pt denies dysphagia, GI following and pt is scheduled for UGI tomorrow, ? necrotic pancreatitis.  Speech swallow evaluation ordered but pt unable to participate yesterday due to npo status for possible  testing.  RN reports some difficulties with pt swallowing pills.   Type of Study: Bedside swallow evaluation Previous Swallow Assessment: none seen in chart Diet Prior to this Study: Regular;Thin liquids Temperature Spikes Noted: No Respiratory Status: Room air History of Recent Intubation: No Behavior/Cognition: Alert;Cooperative;Decreased sustained attention;Requires cueing;Distractible Oral Cavity - Dentition: Poor condition (few upper teeth only) Self-Feeding Abilities: Needs set up Patient Positioning: Upright in bed Baseline Vocal Quality: Wet Volitional Cough: Weak Volitional Swallow: Unable to elicit    Oral/Motor/Sensory Function Overall Oral Motor/Sensory Function:  (no focal cn deficits but pt weak)   Ice Chips Ice chips: Not tested   Thin Liquid Thin Liquid: Impaired Presentation: Straw;Cup;Self Fed Oral Phase Impairments: Impaired anterior to posterior transit Oral Phase Functional Implications: Prolonged oral transit Pharyngeal  Phase Impairments: Suspected delayed Swallow;Cough - Immediate;Wet Vocal Quality Other Comments: cough via straw was significant: suspect aspiration    Nectar Thick Nectar Thick Liquid: Impaired Presentation: Cup;Self Fed;Straw Oral Phase Impairments: Impaired anterior to posterior transit Oral phase functional implications: Prolonged oral transit Pharyngeal Phase Impairments: Cough - Immediate;Suspected delayed Swallow;Multiple swallows Other Comments: cough via straw   Honey Thick Honey Thick Liquid: Not tested   Puree Puree: Impaired Presentation: Self Fed;Spoon Oral Phase Impairments: Impaired anterior to posterior transit Oral Phase Functional Implications: Prolonged oral transit Pharyngeal Phase Impairments: Suspected delayed Swallow;Wet Vocal Quality Other Comments: cough with wet voice x1 cleared with cued cough/throat clear   Solid   GO    Solid: Impaired Presentation: Self Fed Oral Phase Impairments: Impaired anterior to  posterior transit;Reduced lingual movement/coordination Oral Phase Functional Implications: Oral residue Pharyngeal Phase Impairments: Suspected delayed Swallow;Cough - Immediate;Multiple swallows Other Comments: OVERT coughing with solids requiring mastication, suspect aspiration overt due to premature loss of bolus into pharynx       Donavan Burnet, MS [facility] SLP [phone]

## 2012-06-29 NOTE — Consult Note (Signed)
[facility] Consult: 11:56 AM 06/29/2012   Referring Provider: Dr Blake Divine Primary Care Physician:   Primary Gastroenterologist:  Gentry Fitz.  No endoscopic evals found in Epic  Reason for Consultation:  Pancreatitis.   HPI: Steven [name] is a 62 y.o. male with dementia.  Hx CVA on Plavix.  Chronic left humerus fracture and hx of UE crush injuries.  Pt is non-ambulatory. Admitted 4/16 from SNF after falling, fracturing left femur.  S/p 4/18 ORIF. Has had sinus tachycardia and PVCs. Growing enterococcus and Klebsiella from blood cultures.  Started on po Iron for acute anemia, Hgb drop from ~16.0 1 week ago to 10.1 today. MCV normal. DVT prophylaxis with Lovenox since admission.    CT abdomen performed to look for source of bacteremias, leukocytosis.  This shows pancreatic edema, gas collection in head/uncinate raising question of necrotizing pancreatitis. SMA thrombus. Transverse duodenal edema.  CBD is 1.0 cm and not obstructed. However lipase is 22 and LFTs are normal.  Pt says he has had moderate mid, upper abdominal pain and some nausea. No previous episodes pancreatitis.   Was tolerating heart healthy diet but when pancreatitis discovered, was changed to clear liquids.   Past Medical History  Diagnosis Date  . Lumbar spondylitis   . Anxiety   . Urinary incontinence   . GERD (gastroesophageal reflux disease)   . Stroke   . Chronic pain   . Hypertonicity of bladder   . Fracture of left humerus   . History of alcoholism   . Alcoholic hepatitis 2001  . 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   Marland Kitchen Epistaxis 2001    required cauterization  . Thrombus of left atrial appendage 2003    Past Surgical History  Procedure Laterality Date  . Femur im nail Left 06/25/2012    Procedure: INTRAMEDULLARY (IM) NAIL FEMORAL;  Surgeon: Shelda Pal, MD;  Location: [facility];  Service: Orthopedics;  Laterality: Left;  . Knee surgery  1996  . Hand  surgery Bilateral 2003    crush injuries with multitrauma    Prior to Admission medications   Medication Sig Start Date End Date Taking? Authorizing Provider  cholecalciferol (VITAMIN D) 1000 UNITS tablet Take 1,000 Units by mouth every morning.    Yes Historical Provider, MD  clopidogrel (PLAVIX) 75 MG tablet Take 75 mg by mouth every morning.    Yes Historical Provider, MD  Multiple Vitamin (MULITIVITAMIN WITH MINERALS) TABS Take 1 tablet by mouth every morning.    Yes Historical Provider, MD  omeprazole (PRILOSEC) 20 MG capsule Take 40 mg by mouth every morning.    Yes Historical Provider, MD  tamsulosin (FLOMAX) 0.4 MG CAPS Take 0.4 mg by mouth at bedtime.   Yes Historical Provider, MD  enoxaparin (LOVENOX) 40 MG/0.4ML injection Inject 0.4 mLs (40 mg total) into the skin daily. 06/27/12   Genelle Gather Babish, PA-C  HYDROcodone-acetaminophen (NORCO) 5-325 MG per tablet Take 1-2 tablets by mouth every 6 (six) hours as needed for pain. 06/27/12   Genelle Gather Babish, PA-C    Scheduled Meds: . clopidogrel  75 mg Oral Q breakfast  . enoxaparin (LOVENOX) injection  40 mg Subcutaneous Q24H  . ferrous sulfate  325 mg Oral TID PC  . imipenem-cilastatin  500 mg Intravenous Q8H  . metoprolol tartrate  50 mg Oral BID  . pantoprazole  40 mg Oral Daily  . tamsulosin  0.4 mg Oral QHS   Infusions: . lactated ringers 50 mL/hr at 06/26/12 [phone]  PRN Meds: acetaminophen, acetaminophen, fentaNYL, HYDROcodone-acetaminophen, menthol-cetylpyridinium, meperidine (DEMEROL) injection, metoCLOPramide (REGLAN) injection, metoCLOPramide, morphine injection, ondansetron (ZOFRAN) IV, ondansetron, phenol, promethazine   Allergies as of 06/23/2012 - Review Complete 06/23/2012  Allergen Reaction Noted  . Penicillins  01/02/2010    Family History  Problem Relation Age of Onset  . Diabetes type II Mother   . Hypertension Mother   . Diabetes type II Father   . Hypertension Father   . Dementia Father      History   Social History  . Marital Status: Single    Spouse Name: N/A    Number of Children: N/A  . Years of Education: N/A   Occupational History  . Previously employed as Occupational psychologist   Social History Main Topics  . Smoking status: Never Smoker   . Smokeless tobacco: Not on file  . Alcohol Use: No     Comment: 6pack a week  . Drug Use: No  . Sexually Active: Not on file   Other Topics Concern  . Not on file   Social History Narrative  . No narrative on file    REVIEW OF SYSTEMS: Urinary incontinence.   No recent ETOH No headaches or seizures. Left hemiparesis from previous CVA No NSAIDs Some left LE swelling and thigh bruising since fracture and surgery No itching.  No sores No chest pain or dyspnea. Weighed 138 # in 2011, 142# now Diarrheal stools overnight:  Miralax and colace discontinued 4/22.     PHYSICAL EXAM: Vital signs in last 24 hours: Temp:  [97.8 F (36.6 C)-99.3 F (37.4 C)] 97.8 F (36.6 C) (04/22 0600) Pulse Rate:  [96-106] 103 (04/22 0600) Resp:  [16-20] 16 (04/22 0733) BP: (100-139)/(66-72) 100/66 mmHg (04/22 0600) SpO2:  [92 %-96 %] 94 % (04/22 0733) Weight:  142#/64.4 Kg  General: pleasant, looks unwell and old for age but not acutely ill or toxic Head:  Symmetric and unswollen  Eyes:  No icterus.  Conjunctiva pale. Strabismus/ambyopia. Ears:  Not HOH  Nose:  No discharge Mouth:  Very poor nubs of caries ridden teeth remain.  Pink, moist and clear oral MM Neck:  No mass or JVD Lungs:  Clear.  Breathing unlabored Heart: RRR.  Not tachy.  No MRG Abdomen:  Soft, NT, ND.  No mass or HSM.  BS hypoacive.   Rectal: deferred   Musc/Skeltl: incision on left hip is partially bandaged and non-erythematous.  Bruising extending to left medial thigh.  Extremities:  Swelling in left leg  Neurologic:  Oriented only to self.  Fully alert, follows commands.   Limited historian.  Skin:  No telangectasias.  Tattoos:  None seen Nodes:  No  cervical adenopathy   Psych:  Cooperative; not agitated, depressed or anxious  Intake/Output from previous day: 04/21 0701 - 04/22 0700 In: -  Out: 700 [Urine:700] Intake/Output this shift:    LAB RESULTS:  Recent Labs  06/27/12 0520 06/29/12 0450  WBC 13.1* 14.1*  HGB 10.3* 10.1*  HCT 30.2* 30.1*  PLT 141* 192   BMET Lab Results  Component Value Date   NA 134* 06/29/2012   NA 134* 06/28/2012   NA 133* 06/28/2012   K 3.1* 06/29/2012   K 2.7* 06/28/2012   K 2.6* 06/28/2012   CL 101 06/29/2012   CL 100 06/28/2012   CL 102 06/28/2012   CO2 23 06/29/2012   CO2 24 06/28/2012   CO2 25 06/28/2012   GLUCOSE 124* 06/29/2012   GLUCOSE 130* 06/28/2012  GLUCOSE 108* 06/28/2012   BUN 18 06/29/2012   BUN 18 06/28/2012   BUN 16 06/28/2012   CREATININE 0.60 06/29/2012   CREATININE 0.62 06/28/2012   CREATININE 0.62 06/28/2012   CALCIUM 7.6* 06/29/2012   CALCIUM 7.5* 06/28/2012   CALCIUM 7.5* 06/28/2012   LFT  Recent Labs  06/28/12 1740  PROT 5.2*  ALBUMIN 1.9*  AST 34  ALT 41  ALKPHOS 69  BILITOT 0.6   PT/INR No results found for this basename: INR,  PROTIME   Hepatitis Panel No results found for this basename: HEPBSAG, HCVAB, HEPAIGM, HEPBIGM,  in the last 72 hours C-Diff No components found with this basename: cdiff    RADIOLOGY STUDIES: Ct Abdomen Pelvis W Contrast 06/27/2012  Findings: Lung bases:  Motion degradation throughout.  Right greater than left bibasilar airspace disease.  Mild cardiomegaly with coronary artery atherosclerosis.  Interval development of small bilateral pleural effusions.  Abdomen/pelvis:  Motion degradation continuing into the abdomen. Normal liver, spleen.  Tiny hiatal hernia.  Underdistended proximal stomach.  Mildly edematous transverse duodenum.  There is pancreatic and peripancreatic edema which is moderate in severity.  An area of ill defined gas in the region of the inferior pancreatic head and pancreatic uncinate process measures 3.2 cm on image  40/series 2.  There is adjacent filling defect consistent with nonocclusive thrombus as well as  air within the superior mesenteric vein.  Example images 33 - 40/series 2 and image 36/series 5.  Continues to the level of the splenoportal confluence, but no portal venous air is seen.  Normal gallbladder.  Minimal intrahepatic biliary ductal dilatation.  The common duct measures 1.0 cm on image 35/series 2. No definite obstructive stone is seen.  There is hyperattenuation in the region of the ampulla on image 38/series 2.  No peripancreatic fluid collections seen.  Normal adrenal glands and kidneys.  Age advanced aortic atherosclerosis without aneurysm.  Normal caliber of large and small bowel loops.  Small volume abdominal ascites.  Fat containing left inguinal hernia. No pelvic adenopathy.  Tiny amount of air within the nondependent urinary bladder. Normal prostate, without significant free pelvic fluid.  Trace fluid in the pelvis bilaterally.  Bones/Musculoskeletal:  Comminuted proximal left femoral fracture with intramedullary rod in place.  IMPRESSION:  1.  Moderate pancreatic and peripancreatic edema, most consistent with pancreatitis.  Ill defined gas "collection" in the region of the pancreatic head/uncinate process.  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  Differential considerations include extraluminal air from the adjacent duodenum.  Although a duodenal diverticulum could have this appearance, the presence of concurrent superior mesenteric vein thrombus and air argues for an infectious etiology. Biliary ductal dilatation.  Cannot exclude distal choledocholithiasis. If this is a concern, consider MRCP. This study was made a "call report". 2.  Small volume abdominal pelvic ascites. 3.  Development of small bilateral pleural effusions with adjacent collapse / consolidative change. 4.  Air within urinary bladder, likely iatrogenic.  Correlate with prior instrumentation.   Original Report  Authenticated By: Jeronimo Greaves, M.D.     ENDOSCOPIC STUDIES: None found or mentioned in notes  IMPRESSION: *  Apparent pancreatitis found on CT performed to look for source of bacteremia.  CBD dilated with no obvious bile duct stones. Lipase and LFTs normal. Abdominal exam unremarkable.  *  Klebsiella and enterococcal bacteremia:  On Primaxin *  Left hip fracture.  ORIF 4/18. *  Post trauma and post op anemia. On po Iron.  *  Hypokalemia, hyponatremia, hyperglycemia.  *  Dementia *  Non-ambulatory prior to fall.  *  Non-critical thrombocytopenia.   PLAN: *  MRCP?Marland Kitchen  See Dr Marzetta Board addendum.  *  Will allow heart healthy diet, solids not contraindicated in pancreatitis and do not see documented emesis.    LOS: 6 days   Jennye Moccasin  06/29/2012, 11:56 AM Pager: [phone]  Chart was reviewed and patient was examined. X-rays and lab were reviewed.   The patient has an abscess in the area of the head of the pancreas which very likely could be due to a posterior penetrating ulcer with a walled off abscess.  This may explain why his pancreatic enzymes are normal.  Alternatively he may have primary pancreatitis.  Bile duct dilatation is probably related to an inflammatory stricture in the area the head of the pancreas.  Recommend Gastrografin upper GI series. There doesn't appear to be anything to drain percutaneously.  Continue broad-spectrum antibiotics.  Barbette Hair. Arlyce Dice, M.D., [facility] Cell [phone]

## 2012-06-29 NOTE — Progress Notes (Signed)
Patient ID: Steven [name], male   DOB: [birth year], 62 y.o.   MRN: [id number]         [facility]    Date of Admission:  06/23/2012   Total days of antibiotics 4        Day 2 imipenem         Principal Problem:   Closed left hip fracture Active Problems:   CVA   Dementia   Fracture of left humerus   Leucocytosis   Bacteremia due to Enterococcus   Bacteremia due to Klebsiella pneumoniae   Abnormal finding on GI tract imaging   . clopidogrel  75 mg Oral Q breakfast  . enoxaparin (LOVENOX) injection  40 mg Subcutaneous Q24H  . ferrous sulfate  325 mg Oral TID PC  . imipenem-cilastatin  500 mg Intravenous Q8H  . metoprolol tartrate  50 mg Oral BID  . pantoprazole  40 mg Oral Daily  . tamsulosin  0.4 mg Oral QHS    Subjective: He denies abdominal pain.  Objective: Temp:  [97.8 F (36.6 C)-99.3 F (37.4 C)] 97.8 F (36.6 C) (04/22 0600) Pulse Rate:  [96-103] 103 (04/22 0600) Resp:  [16-20] 16 (04/22 0733) BP: (100-107)/(66-72) 100/66 mmHg (04/22 0600) SpO2:  [92 %-96 %] 94 % (04/22 0733)  General:  resting quietly in bed Lungs:  clear Cor:  regular S1 and S2 no murmurs Abdomen:  nontender  Lab Results Lab Results  Component Value Date   WBC 14.1* 06/29/2012   HGB 10.1* 06/29/2012   HCT 30.1* 06/29/2012   MCV 82.5 06/29/2012   PLT 192 06/29/2012    Lab Results  Component Value Date   CREATININE 0.60 06/29/2012   BUN 18 06/29/2012   NA 134* 06/29/2012   K 3.1* 06/29/2012   CL 101 06/29/2012   CO2 23 06/29/2012    Lab Results  Component Value Date   ALT 41 06/28/2012   AST 34 06/28/2012   ALKPHOS 69 06/28/2012   BILITOT 0.6 06/28/2012  Amylase 53 Lipase 22   Microbiology: Recent Results (from the past 240 hour(s))  CULTURE, BLOOD (ROUTINE X 2)     Status: None   Collection Time    06/24/12  4:05 AM      Result Value Range Status   Specimen Description BLOOD LEFT ARM   Final   Special Requests BOTTLES DRAWN AEROBIC AND ANAEROBIC 4CC    Final   Culture  Setup Time 06/24/2012 08:30   Final   Culture     Final   Value: GRAM NEGATIVE RODS     ENTEROCOCCUS SPECIES     Note: Gram Stain Report Called to,Read Back By and Verified With: MONIQUE HOWLETT ON 06/24/2012 AT 11:16P BY WILEJ   Report Status 06/27/2012 FINAL   Final  CULTURE, BLOOD (ROUTINE X 2)     Status: None   Collection Time    06/24/12  4:05 AM      Result Value Range Status   Specimen Description BLOOD RIGHT ANTECUBITAL   Final   Special Requests BOTTLES DRAWN AEROBIC AND ANAEROBIC 5CC   Final   Culture  Setup Time 06/24/2012 08:30   Final   Culture     Final   Value: ENTEROCOCCUS SPECIES     Note: COMBINATION THERAPY OF HIGH DOSE AMPICILLIN OR VANCOMYCIN, PLUS AN AMINOGLYCOSIDE, IS USUALLY INDICATED FOR SERIOUS ENTEROCOCCAL INFECTIONS.     KLEBSIELLA PNEUMONIAE     Note: Gram Stain Report Called to,Read Back  By and Verified With: MONIQUE HOWLETT ON 06/24/2012 AT 11:16P   Report Status 06/27/2012 FINAL   Final   Organism ID, Bacteria ENTEROCOCCUS SPECIES   Final   Organism ID, Bacteria KLEBSIELLA PNEUMONIAE   Final  MRSA PCR SCREENING     Status: None   Collection Time    06/24/12  4:52 AM      Result Value Range Status   MRSA by PCR NEGATIVE  NEGATIVE Final   Comment:            The GeneXpert MRSA Assay (FDA     approved for NASAL specimens     only), is one component of a     comprehensive MRSA colonization     surveillance program. It is not     intended to diagnose MRSA     infection nor to guide or     monitor treatment for     MRSA infections.  CULTURE, BLOOD (ROUTINE X 2)     Status: None   Collection Time    06/27/12  1:01 PM      Result Value Range Status   Specimen Description BLOOD LEFT ARM   Final   Special Requests BOTTLES DRAWN AEROBIC ONLY 1CC   Final   Culture  Setup Time 06/27/2012 17:11   Final   Culture     Final   Value:        BLOOD CULTURE RECEIVED NO GROWTH TO DATE CULTURE WILL BE HELD FOR 5 DAYS BEFORE ISSUING A FINAL  NEGATIVE REPORT   Report Status PENDING   Incomplete  CULTURE, BLOOD (ROUTINE X 2)     Status: None   Collection Time    06/27/12  1:21 PM      Result Value Range Status   Specimen Description BLOOD LEFT HAND   Final   Special Requests BOTTLES DRAWN AEROBIC ONLY 1CC   Final   Culture  Setup Time 06/27/2012 17:10   Final   Culture     Final   Value:        BLOOD CULTURE RECEIVED NO GROWTH TO DATE CULTURE WILL BE HELD FOR 5 DAYS BEFORE ISSUING A FINAL NEGATIVE REPORT   Report Status PENDING   Incomplete    Studies/Results: Ct Abdomen Pelvis W Contrast  06/27/2012  *RADIOLOGY REPORT*  Clinical Data: Evaluate for GI source of infection. Elevated white blood cell count.  Demented.  Gastroesophageal reflux disease.  No pain.  CT ABDOMEN AND PELVIS WITH CONTRAST  Technique:  Multidetector CT imaging of the abdomen and pelvis was performed following the standard protocol during bolus administration of intravenous contrast.  Contrast: OMNIPAQUE IOHEXOL 300 MG/ML  SOLN, 50mL OMNIPAQUE IOHEXOL 300 MG/ML  SOLN  Comparison: Chest CT 06/24/2012.  No prior abdominal imaging.  Findings: Lung bases:  Motion degradation throughout.  Right greater than left bibasilar airspace disease.  Mild cardiomegaly with coronary artery atherosclerosis.  Interval development of small bilateral pleural effusions.  Abdomen/pelvis:  Motion degradation continuing into the abdomen. Normal liver, spleen.  Tiny hiatal hernia.  Underdistended proximal stomach.  Mildly edematous transverse duodenum.  There is pancreatic and peripancreatic edema which is moderate in severity.  An area of ill defined gas in the region of the inferior pancreatic head and pancreatic uncinate process measures 3.2 cm on image 40/series 2.  There is adjacent filling defect consistent with nonocclusive thrombus as well as  air within the superior mesenteric vein.  Example images 33 - 40/series 2 and image  36/series 5.  Continues to the level of the  splenoportal confluence, but no portal venous air is seen.  Normal gallbladder.  Minimal intrahepatic biliary ductal dilatation.  The common duct measures 1.0 cm on image 35/series 2. No definite obstructive stone is seen.  There is hyperattenuation in the region of the ampulla on image 38/series 2.  No peripancreatic fluid collections seen.  Normal adrenal glands and kidneys.  Age advanced aortic atherosclerosis without aneurysm.  Normal caliber of large and small bowel loops.  Small volume abdominal ascites.  Fat containing left inguinal hernia. No pelvic adenopathy.  Tiny amount of air within the nondependent urinary bladder. Normal prostate, without significant free pelvic fluid.  Trace fluid in the pelvis bilaterally.  Bones/Musculoskeletal:  Comminuted proximal left femoral fracture with intramedullary rod in place.  IMPRESSION:  1.  Moderate pancreatic and peripancreatic edema, most consistent with pancreatitis.  Ill defined gas "collection" in the region of the pancreatic head/uncinate process.  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  Differential considerations include extraluminal air from the adjacent duodenum.  Although a duodenal diverticulum could have this appearance, the presence of concurrent superior mesenteric vein thrombus and air argues for an infectious etiology. Biliary ductal dilatation.  Cannot exclude distal choledocholithiasis. If this is a concern, consider MRCP. This study was made a "call report". 2.  Small volume abdominal pelvic ascites. 3.  Development of small bilateral pleural effusions with adjacent collapse / consolidative change. 4.  Air within urinary bladder, likely iatrogenic.  Correlate with prior instrumentation.   Original Report Authenticated By: Jeronimo Greaves, M.D.     Assessment:  I appreciate Dr. Marzetta Board evaluation and agree with his assessment and plans. Given the normal amylase and lipase he could have contained bowel perforation. He seems to  be responding to antibiotic therapy for his polymicrobial bacteremia and localized intra-abdominal abscess.  Plan: 1. Continue imipenem 2. Gastrografin upper GI series  Cliffton Asters, MD [facility] [phone] pager   [phone] cell 06/29/2012, 2:56 PM

## 2012-06-29 NOTE — Progress Notes (Signed)
Please call if we can be of further help. Continue beta blocker

## 2012-06-29 NOTE — Progress Notes (Signed)
Patient has had 6 episodes of loose watery, black stools through out the night shift

## 2012-06-29 NOTE — Progress Notes (Signed)
CRITICAL VALUE ALERT  Critical value received:  Potassium 2.6  Date of notification:  06-28-12  Time of notification:  1850  Critical value read back:yes  Nurse who received alert:  Silvano Bilis, RN  MD notified (1st page):  Dr. Blake Divine  Time of first page:  1850  MD notified (2nd page):  Time of second page:  Responding MD:  Dr. Blake Divine  Time MD responded:  [phone]

## 2012-06-29 NOTE — Progress Notes (Signed)
[facility] PROGRESS NOTE  Steven [name] ZOX:[id number] DOB: [birth year] DOA: 06/23/2012 PCP: No primary provider on file.  Assessment/Plan:                                                                                                                1. Left femoral intertrochanteric fracture -  Orthopedics on board. Underwent ORIF of the left femur on 4/18. PT/OT evaluation , plan for SNF placement. Pain control and anticoagulation on board.  2.  Sinus tachycardia - not sure what's causing it.  Improved. May be pain related or due to dehydration.  CT angiogram of the chest to rule out PE was negative. Free t4 and TSH within normal limits. Echocardiogram showed good LVEF without any wall abn. His tachycardia improved and he is b blockers.  3. History of CVA with left-sided hemiparesis - initially plavix held  For surgery of the left hip. Will restart plavix .  4. Dementia - no acute issues. 5. Chronic left humerus fracture.; no acute issues.  6. Polymicrobial bacteremia/Leukocytosis - slightly worsened when compared to the last 2 days.  patient is afebrile. Chest x-ray and UA are unremarkable except the chest x-ray showing mild congestion. He was found to have polymicrobial bacteremia with Positive Blood cultures showing enterococcus and klebsiella.   ID consulted for further recommendations. CT abd and pelvis was done and showed necrotizing pancreatitis. Started him on primaxin.  Overnight RN also reported about 5  watery stools , he was on colace and miralax which were discontinued, will order c diff pcr  As his leukocytosis is getting worse.  7. Hypokalemia; replete as needed. Will order a magnesium level .  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 Lipase levels within normal limits. LFT'S within normal limits. GI consulted from [facility] .  9. DVT prophylaxis on lovenox.  Code Status: full code Family Communication: none at bedside, spoke to his sister PAMELA who  makes medical decisions.  Disposition Plan: SNF when medically stable  PROCEDURE: Open reduction and internal fixation of the left comminuted intertrochanteric femur fracture on 4/18  Consultants:  Cardiology for tachycardic  Orthopedics   ID Dr Orvan Falconer.  HPI/Subjective: He wants to know when his sister is coming to visit him. He is more calmer today and making conversation.  Objective: Filed Vitals:   06/29/12 0000 06/29/12 0400 06/29/12 0600 06/29/12 0733  BP:   100/66   Pulse:   103   Temp:   97.8 F (36.6 C)   TempSrc:   Oral   Resp: 16 16 16 16   Height:      Weight:      SpO2: 96% 96% 92% 94%    Intake/Output Summary (Last 24 hours) at 06/29/12 0912 Last data filed at 06/29/12 0645  Gross per 24 hour  Intake      0 ml  Output    700 ml  Net   -700 ml   Filed Weights   06/24/12 0837  Weight: 64.411 kg (142 lb)  Exam:  General: Well-developed well-nourished.   Cardiovascular: S1-S2 heard tachycardic.  Respiratory: No rhonchi or crepitations. A bdomen: Soft nontender bowel sounds present.   Musculoskeletal: Pain on moving left shoulder and left leg.  Marland Kitchen Neurologic: Has left-sided weakness from old stroke  Data Reviewed: Basic Metabolic Panel:  Recent Labs Lab 06/26/12 0533 06/27/12 0520 06/28/12 0855 06/28/12 1740 06/28/12 1913 06/29/12 0450  NA 131* 132* 133* 134*  --  134*  K 3.2* 2.9* 2.9* 2.6* 2.7* 3.1*  CL 98 98 102 100  --  101  CO2 26 26 25 24   --  23  GLUCOSE 125* 138* 108* 130*  --  124*  BUN 22 17 16 18   --  18  CREATININE 0.67 0.63 0.62 0.62  --  0.60  CALCIUM 7.7* 7.8* 7.5* 7.5*  --  7.6*  MG  --  2.1 2.3  --   --   --    Liver Function Tests:  Recent Labs Lab 06/24/12 0405 06/28/12 1740  AST  --  34  ALT  --  41  ALKPHOS  --  69  BILITOT  --  0.6  PROT  --  5.2*  ALBUMIN 3.1* 1.9*    Recent Labs Lab 06/28/12 1740  LIPASE 22  AMYLASE 53   No results found for this basename: AMMONIA,  in the last 168  hours CBC:  Recent Labs Lab 06/23/12 2341 06/24/12 0405 06/25/12 0949 06/26/12 0533 06/27/12 0520 06/29/12 0450  WBC 20.7* 17.0* 13.5* 13.0* 13.1* 14.1*  NEUTROABS 18.2*  --   --   --   --   --   HGB 17.2* 15.6 11.6* 10.8* 10.3* 10.1*  HCT 47.7 44.0 33.2* 30.6* 30.2* 30.1*  MCV 81.4 82.1 83.8 82.9 83.4 82.5  PLT 193 147* 107* 116* 141* 192   Cardiac Enzymes:  Recent Labs Lab 06/23/12 2341 06/24/12 0405 06/25/12 0950  TROPONINI <0.30 <0.30 <0.30   BNP (last 3 results)  Recent Labs  06/24/12 0405 06/24/12 0551  PROBNP 1874.0* 1830.0*   CBG: No results found for this basename: GLUCAP,  in the last 168 hours  Recent Results (from the past 240 hour(s))  CULTURE, BLOOD (ROUTINE X 2)     Status: None   Collection Time    06/24/12  4:05 AM      Result Value Range Status   Specimen Description BLOOD LEFT ARM   Final   Special Requests BOTTLES DRAWN AEROBIC AND ANAEROBIC 4CC   Final   Culture  Setup Time 06/24/2012 08:30   Final   Culture     Final   Value: GRAM NEGATIVE RODS     ENTEROCOCCUS SPECIES     Note: Gram Stain Report Called to,Read Back By and Verified With: MONIQUE HOWLETT ON 06/24/2012 AT 11:16P BY WILEJ   Report Status 06/27/2012 FINAL   Final  CULTURE, BLOOD (ROUTINE X 2)     Status: None   Collection Time    06/24/12  4:05 AM      Result Value Range Status   Specimen Description BLOOD RIGHT ANTECUBITAL   Final   Special Requests BOTTLES DRAWN AEROBIC AND ANAEROBIC 5CC   Final   Culture  Setup Time 06/24/2012 08:30   Final   Culture     Final   Value: ENTEROCOCCUS SPECIES     Note: COMBINATION THERAPY OF HIGH DOSE AMPICILLIN OR VANCOMYCIN, PLUS AN AMINOGLYCOSIDE, IS USUALLY INDICATED FOR SERIOUS ENTEROCOCCAL INFECTIONS.     KLEBSIELLA PNEUMONIAE  Note: Gram Stain Report Called to,Read Back By and Verified With: MONIQUE HOWLETT ON 06/24/2012 AT 11:16P   Report Status 06/27/2012 FINAL   Final   Organism ID, Bacteria ENTEROCOCCUS SPECIES   Final    Organism ID, Bacteria KLEBSIELLA PNEUMONIAE   Final  MRSA PCR SCREENING     Status: None   Collection Time    06/24/12  4:52 AM      Result Value Range Status   MRSA by PCR NEGATIVE  NEGATIVE Final   Comment:            The GeneXpert MRSA Assay (FDA     approved for NASAL specimens     only), is one component of a     comprehensive MRSA colonization     surveillance program. It is not     intended to diagnose MRSA     infection nor to guide or     monitor treatment for     MRSA infections.  CULTURE, BLOOD (ROUTINE X 2)     Status: None   Collection Time    06/27/12  1:01 PM      Result Value Range Status   Specimen Description BLOOD LEFT ARM   Final   Special Requests BOTTLES DRAWN AEROBIC ONLY 1CC   Final   Culture  Setup Time 06/27/2012 17:11   Final   Culture     Final   Value:        BLOOD CULTURE RECEIVED NO GROWTH TO DATE CULTURE WILL BE HELD FOR 5 DAYS BEFORE ISSUING A FINAL NEGATIVE REPORT   Report Status PENDING   Incomplete  CULTURE, BLOOD (ROUTINE X 2)     Status: None   Collection Time    06/27/12  1:21 PM      Result Value Range Status   Specimen Description BLOOD LEFT HAND   Final   Special Requests BOTTLES DRAWN AEROBIC ONLY 1CC   Final   Culture  Setup Time 06/27/2012 17:10   Final   Culture     Final   Value:        BLOOD CULTURE RECEIVED NO GROWTH TO DATE CULTURE WILL BE HELD FOR 5 DAYS BEFORE ISSUING A FINAL NEGATIVE REPORT   Report Status PENDING   Incomplete     Studies: Ct Abdomen Pelvis W Contrast  06/27/2012  *RADIOLOGY REPORT*  Clinical Data: Evaluate for GI source of infection. Elevated white blood cell count.  Demented.  Gastroesophageal reflux disease.  No pain.  CT ABDOMEN AND PELVIS WITH CONTRAST  Technique:  Multidetector CT imaging of the abdomen and pelvis was performed following the standard protocol during bolus administration of intravenous contrast.  Contrast: OMNIPAQUE IOHEXOL 300 MG/ML  SOLN, 50mL OMNIPAQUE IOHEXOL 300 MG/ML  SOLN   Comparison: Chest CT 06/24/2012.  No prior abdominal imaging.  Findings: Lung bases:  Motion degradation throughout.  Right greater than left bibasilar airspace disease.  Mild cardiomegaly with coronary artery atherosclerosis.  Interval development of small bilateral pleural effusions.  Abdomen/pelvis:  Motion degradation continuing into the abdomen. Normal liver, spleen.  Tiny hiatal hernia.  Underdistended proximal stomach.  Mildly edematous transverse duodenum.  There is pancreatic and peripancreatic edema which is moderate in severity.  An area of ill defined gas in the region of the inferior pancreatic head and pancreatic uncinate process measures 3.2 cm on image 40/series 2.  There is adjacent filling defect consistent with nonocclusive thrombus as well as  air within the superior mesenteric vein.  Example images 33 - 40/series 2 and image 36/series 5.  Continues to the level of the splenoportal confluence, but no portal venous air is seen.  Normal gallbladder.  Minimal intrahepatic biliary ductal dilatation.  The common duct measures 1.0 cm on image 35/series 2. No definite obstructive stone is seen.  There is hyperattenuation in the region of the ampulla on image 38/series 2.  No peripancreatic fluid collections seen.  Normal adrenal glands and kidneys.  Age advanced aortic atherosclerosis without aneurysm.  Normal caliber of large and small bowel loops.  Small volume abdominal ascites.  Fat containing left inguinal hernia. No pelvic adenopathy.  Tiny amount of air within the nondependent urinary bladder. Normal prostate, without significant free pelvic fluid.  Trace fluid in the pelvis bilaterally.  Bones/Musculoskeletal:  Comminuted proximal left femoral fracture with intramedullary rod in place.  IMPRESSION:  1.  Moderate pancreatic and peripancreatic edema, most consistent with pancreatitis.  Ill defined gas "collection" in the region of the pancreatic head/uncinate process.  This is suspicious for an  area of necrotizing pancreatitis.  Differential considerations include extraluminal air from the adjacent duodenum.  Although a duodenal diverticulum could have this appearance, the presence of concurrent superior mesenteric vein thrombus and air argues for an infectious etiology. Biliary ductal dilatation.  Cannot exclude distal choledocholithiasis. If this is a concern, consider MRCP. This study was made a "call report". 2.  Small volume abdominal pelvic ascites. 3.  Development of small bilateral pleural effusions with adjacent collapse / consolidative change. 4.  Air within urinary bladder, likely iatrogenic.  Correlate with prior instrumentation.   Original Report Authenticated By: Jeronimo Greaves, M.D.     Scheduled Meds: . docusate sodium  100 mg Oral BID  . enoxaparin (LOVENOX) injection  40 mg Subcutaneous Q24H  . ferrous sulfate  325 mg Oral TID PC  . imipenem-cilastatin  500 mg Intravenous Q8H  . metoprolol tartrate  50 mg Oral BID  . pantoprazole  40 mg Oral Daily  . potassium chloride  40 mEq Oral BID  . tamsulosin  0.4 mg Oral QHS   Continuous Infusions: . lactated ringers 50 mL/hr at 06/26/12 2130    Principal Problem:   Closed left hip fracture Active Problems:   CVA   Dementia   Fracture of left humerus   Leucocytosis   Bacteremia due to Enterococcus   Bacteremia due to Klebsiella pneumoniae    Time spent: 30 min    Daking Westervelt  [facility] Pager [phone] If 7PM-7AM, please contact night-coverage at [email or web address], password [facility] 06/29/2012, 9:12 AM  LOS: 6 days

## 2012-06-30 ENCOUNTER — Inpatient Hospital Stay (HOSPITAL_COMMUNITY): Payer: Medicaid Other

## 2012-06-30 DIAGNOSIS — S42309A Unspecified fracture of shaft of humerus, unspecified arm, initial encounter for closed fracture: Secondary | ICD-10-CM

## 2012-06-30 DIAGNOSIS — R933 Abnormal findings on diagnostic imaging of other parts of digestive tract: Secondary | ICD-10-CM

## 2012-06-30 LAB — COMPREHENSIVE METABOLIC PANEL
ALT: 33 U/L (ref 0–53)
AST: 28 U/L (ref 0–37)
Alkaline Phosphatase: 70 U/L (ref 39–117)
CO2: 22 mEq/L (ref 19–32)
GFR calc Af Amer: >90 mL/min (ref >90)
GFR calc non Af Amer: >90 mL/min (ref >90)
Glucose, Bld: 130 mg/dL — ABNORMAL HIGH (ref 70–99)
Potassium: 2.9 mEq/L — ABNORMAL LOW (ref 3.5–5.1)
Sodium: 136 mEq/L (ref 135–145)
Total Protein: 5 g/dL — ABNORMAL LOW (ref 6.0–8.3)

## 2012-06-30 LAB — URINE CULTURE: Colony Count: NO GROWTH

## 2012-06-30 LAB — CLOSTRIDIUM DIFFICILE BY PCR: Toxigenic C. Difficile by PCR: NEGATIVE

## 2012-06-30 LAB — CBC
Hemoglobin: 10.5 g/dL — ABNORMAL LOW (ref 13.0–17.0)
Platelets: 227 10*3/uL (ref 150–400)
RBC: 3.65 MIL/uL — ABNORMAL LOW (ref 4.22–5.81)

## 2012-06-30 MED ORDER — POTASSIUM CHLORIDE CRYS ER 20 MEQ PO TBCR
30.0000 meq | EXTENDED_RELEASE_TABLET | Freq: Two times a day (BID) | ORAL | Status: AC
  Administered 2012-06-30 (×2): 30 meq via ORAL
  Filled 2012-06-30 (×2): qty 1

## 2012-06-30 MED ORDER — IOHEXOL 300 MG/ML  SOLN
50.0000 mL | Freq: Once | INTRAMUSCULAR | Status: AC | PRN
  Administered 2012-06-30: 100 mL via ORAL

## 2012-06-30 NOTE — Progress Notes (Addendum)
Patient ID: Steven [name], male   DOB: 26-Jan-1951, 62 y.o.   MRN: [id number]         [facility]    Date of Admission:  06/23/2012   Total days of antibiotics 5        Day 3 imipenem         Principal Problem:   Closed left hip fracture Active Problems:   CVA   Dementia   Fracture of left humerus   Leucocytosis   Bacteremia due to Enterococcus   Bacteremia due to Klebsiella pneumoniae   Abnormal finding on GI tract imaging   . clopidogrel  75 mg Oral Q breakfast  . enoxaparin (LOVENOX) injection  40 mg Subcutaneous Q24H  . ferrous sulfate  325 mg Oral TID PC  . imipenem-cilastatin  500 mg Intravenous Q8H  . metoprolol tartrate  50 mg Oral BID  . pantoprazole  40 mg Oral Daily  . potassium chloride  30 mEq Oral BID  . tamsulosin  0.4 mg Oral QHS    Subjective: He is not having any abdominal pain.  Objective: Temp:  [98.4 F (36.9 C)-99.3 F (37.4 C)] 98.4 F (36.9 C) (04/23 0642) Pulse Rate:  [100-110] 110 (04/23 0642) Resp:  [16-24] 18 (04/23 0800) BP: (100-105)/(67-69) 100/69 mmHg (04/23 0642) SpO2:  [90 %-94 %] 91 % (04/23 0800)  General: He is alert and appears comfortable Abdomen: Nontender  Lab Results Lab Results  Component Value Date   WBC 19.7* 06/30/2012   HGB 10.5* 06/30/2012   HCT 30.0* 06/30/2012   MCV 82.2 06/30/2012   PLT 227 06/30/2012    Lab Results  Component Value Date   CREATININE 0.56 06/30/2012   BUN 21 06/30/2012   NA 136 06/30/2012   K 2.9* 06/30/2012   CL 105 06/30/2012   CO2 22 06/30/2012    Lab Results  Component Value Date   ALT 33 06/30/2012   AST 28 06/30/2012   ALKPHOS 70 06/30/2012   BILITOT 0.8 06/30/2012      Microbiology: Recent Results (from the past 240 hour(s))  CULTURE, BLOOD (ROUTINE X 2)     Status: None   Collection Time    06/24/12  4:05 AM      Result Value Range Status   Specimen Description BLOOD LEFT ARM   Final   Special Requests BOTTLES DRAWN AEROBIC AND ANAEROBIC 4CC   Final    Culture  Setup Time 06/24/2012 08:30   Final   Culture     Final   Value: GRAM NEGATIVE RODS     ENTEROCOCCUS SPECIES     Note: Gram Stain Report Called to,Read Back By and Verified With: MONIQUE HOWLETT ON 06/24/2012 AT 11:16P BY WILEJ   Report Status 06/27/2012 FINAL   Final  CULTURE, BLOOD (ROUTINE X 2)     Status: None   Collection Time    06/24/12  4:05 AM      Result Value Range Status   Specimen Description BLOOD RIGHT ANTECUBITAL   Final   Special Requests BOTTLES DRAWN AEROBIC AND ANAEROBIC 5CC   Final   Culture  Setup Time 06/24/2012 08:30   Final   Culture     Final   Value: ENTEROCOCCUS SPECIES     Note: COMBINATION THERAPY OF HIGH DOSE AMPICILLIN OR VANCOMYCIN, PLUS AN AMINOGLYCOSIDE, IS USUALLY INDICATED FOR SERIOUS ENTEROCOCCAL INFECTIONS.     KLEBSIELLA PNEUMONIAE     Note: Gram Stain Report Called to,Read Back By  and Verified With: MONIQUE HOWLETT ON 06/24/2012 AT 11:16P   Report Status 06/27/2012 FINAL   Final   Organism ID, Bacteria ENTEROCOCCUS SPECIES   Final   Organism ID, Bacteria KLEBSIELLA PNEUMONIAE   Final  MRSA PCR SCREENING     Status: None   Collection Time    06/24/12  4:52 AM      Result Value Range Status   MRSA by PCR NEGATIVE  NEGATIVE Final   Comment:            The GeneXpert MRSA Assay (FDA     approved for NASAL specimens     only), is one component of a     comprehensive MRSA colonization     surveillance program. It is not     intended to diagnose MRSA     infection nor to guide or     monitor treatment for     MRSA infections.  CULTURE, BLOOD (ROUTINE X 2)     Status: None   Collection Time    06/27/12  1:01 PM      Result Value Range Status   Specimen Description BLOOD LEFT ARM   Final   Special Requests BOTTLES DRAWN AEROBIC ONLY 1CC   Final   Culture  Setup Time 06/27/2012 17:11   Final   Culture     Final   Value:        BLOOD CULTURE RECEIVED NO GROWTH TO DATE CULTURE WILL BE HELD FOR 5 DAYS BEFORE ISSUING A FINAL NEGATIVE  REPORT   Report Status PENDING   Incomplete  CULTURE, BLOOD (ROUTINE X 2)     Status: None   Collection Time    06/27/12  1:21 PM      Result Value Range Status   Specimen Description BLOOD LEFT HAND   Final   Special Requests BOTTLES DRAWN AEROBIC ONLY 1CC   Final   Culture  Setup Time 06/27/2012 17:10   Final   Culture     Final   Value:        BLOOD CULTURE RECEIVED NO GROWTH TO DATE CULTURE WILL BE HELD FOR 5 DAYS BEFORE ISSUING A FINAL NEGATIVE REPORT   Report Status PENDING   Incomplete  URINE CULTURE     Status: None   Collection Time    06/29/12  6:20 AM      Result Value Range Status   Specimen Description URINE, RANDOM   Final   Special Requests Immunocompromised   Final   Culture  Setup Time 06/29/2012 08:38   Final   Colony Count NO GROWTH   Final   Culture NO GROWTH   Final   Report Status 06/30/2012 FINAL   Final  CLOSTRIDIUM DIFFICILE BY PCR     Status: None   Collection Time    06/29/12  4:45 PM      Result Value Range Status   C difficile by pcr NEGATIVE  NEGATIVE Final   Assessment: His white blood cell count is up somewhat but repeat blood cultures are negative and he appears to be responding to antibiotic therapy for polymicrobial bacteremia and peri-pancreatic abscess.  Plan: 1. Continue imipenem 2. Await results of Gastrografin upper GI study  Cliffton Asters, MD [facility] Disease [facility] [phone] pager   [phone] cell 06/30/2012, 9:36 AM

## 2012-06-30 NOTE — Progress Notes (Signed)
   Subjective: 5 Days Post-Op Procedure(s) (LRB): INTRAMEDULLARY (IM) NAIL FEMORAL (Left)   Patient remains confused. Does complain of being sore particularly around left hip.  Objective:   VITALS:   Filed Vitals:   06/30/12 1414  BP: 119/82  Pulse: 89  Temp: 98.9 F (37.2 C)  Resp: 16    Incision: dressing C/D/I No cellulitis present Compartment soft  LABS  Recent Labs  06/29/12 0450 06/30/12 0510  HGB 10.1* 10.5*  HCT 30.1* 30.0*  WBC 14.1* 19.7*  PLT 192 227     Recent Labs  06/28/12 1740 06/28/12 1913 06/29/12 0450 06/30/12 0510  NA 134*  --  134* 136  K 2.6* 2.7* 3.1* 2.9*  BUN 18  --  18 21  CREATININE 0.62  --  0.60 0.56  GLUCOSE 130*  --  124* 130*     Assessment/Plan: 5 Days Post-Op Procedure(s) (LRB): INTRAMEDULLARY (IM) NAIL FEMORAL (Left)  If Aquacel dressing comes off, please replace with guaze and tape and change daily Up with therapy Discharge to SNF once cleared medically. Orthopaedically stable  50% weight bearing left leg  Lovenox for anticoagulation, Rx on chart  Hydrocodone for pain management, Rx on chart  Follow up in 2 weeks at [facility].     Follow up with OLIN,Allon Costlow D in 2 weeks.   Contact information:      [facility]     [address]     [place] [address]     [phone]      Anastasio Auerbach. Manya Balash   PAC  06/30/2012, 2:23 PM

## 2012-06-30 NOTE — Progress Notes (Addendum)
Speech Language Pathology Dysphagia Treatment Patient Details Name: Steven [name] MRN: [id number] DOB: Jul 05, 1950 Today's Date: 06/30/2012 Time: 4098-1191 SLP Time Calculation (min): 17 min  Assessment / Plan / Recommendation Clinical Impression  Skilled dysphagia treatment completed including education of pt's sister Steven [name] to dysphagia symptoms, aspiration mitigation strategies and diet modifications.  Pt's sister reports that pt has frequently "choked" while eating over the last two years.  Steven [name] also reports pt refusing to eat recently - pt unable to state why but note GI documentation indicating pt c/o nausea.    Suspect possible pancreatitis contributing to poor po.   Pt observed self feeding ice cream and water - delayed strong cough x1 noted of approx 10 boluses.  Pt informed SLP to "get damn hands off of me" when trying to help pt hold icecream cup.  Uncertain how amenable pt would be to active dysphagia treatment.   Rec continue diet modification and consider nutrition consult to maximize intake (pt used to consume Ensure/Boost per sister).  Also recommend pt have follow up SLP at SNF on trial basis to maximize swallow rehab with hopes of lifting diet restrictions in near future.      Diet Recommendation  Continue with Current Diet: Dysphagia 1 (puree);Thin liquid    SLP Plan Continue with current plan of care   Pertinent Vitals/Pain Afebrile,poor intake   Swallowing Goals  SLP Swallowing Goals Swallow Study Goal #2 - Progress: Progressing toward goal Swallow Study Goal #3 - Progress: Progressing toward goal  General Behavior/Cognition: Alert;Decreased sustained attention;Doesn't follow directions;Agitated Oral Cavity - Dentition: Poor condition Patient Positioning: Upright in bed  Oral Cavity - Oral Hygiene   oral cavity clear  Dysphagia Treatment Treatment focused on: Skilled observation of diet tolerance;Patient/family/caregiver education Family/Caregiver Educated:  sister Steven [name] Treatment Methods/Modalities: Skilled observation Patient observed directly with PO's: Yes Type of PO's observed: Thin liquids (icecream) Feeding: Needs assist Liquids provided via: Cup Pharyngeal Phase Signs & Symptoms: Suspected delayed swallow initiation;Delayed cough (x 1 of approx 10 boluses) Type of cueing: Verbal Amount of cueing: Moderate   GO     Mickie Bail [place], [place] [facility] [phone]

## 2012-06-30 NOTE — Progress Notes (Signed)
[facility] Progress Note  SUBJECTIVE: Abdominal pain about the same as yesterday. Nauseated.   OBJECTIVE:  Vital signs in last 24 hours: Temp:  [98.4 F (36.9 C)-99.3 F (37.4 C)] 98.4 F (36.9 C) (04/23 1610) Pulse Rate:  [100-110] 110 (04/23 0642) Resp:  [16-24] 18 (04/23 0800) BP: (100-105)/(67-69) 100/69 mmHg (04/23 0642) SpO2:  [90 %-94 %] 91 % (04/23 0800) Last BM Date: 06/29/12 General:    white male in NAD  bilaterally Abdomen:  Soft, nondistended, mild epigastric tenderness. Normal bowel sounds. Extremities:  Without edema. Neurologic:  Alert and oriented,  grossly normal neurologically. Psych:  Cooperative.   Lab Results:  Recent Labs  06/29/12 0450 06/30/12 0510  WBC 14.1* 19.7*  HGB 10.1* 10.5*  HCT 30.1* 30.0*  PLT 192 227   BMET  Recent Labs  06/28/12 1740 06/28/12 1913 06/29/12 0450 06/30/12 0510  NA 134*  --  134* 136  K 2.6* 2.7* 3.1* 2.9*  CL 100  --  101 105  CO2 24  --  23 22  GLUCOSE 130*  --  124* 130*  BUN 18  --  18 21  CREATININE 0.62  --  0.60 0.56  CALCIUM 7.5*  --  7.6* 7.5*   LFT  Recent Labs  06/30/12 0510  PROT 5.0*  ALBUMIN 1.9*  AST 28  ALT 33  ALKPHOS 70  BILITOT 0.8     ASSESSMENT / PLAN:  1. Apparent pancreatitis found on CT scan done for workup of bacteremia.. There is questionable abscess in area of pancreatic head, possibly posterior penetrating ulcer with abscess which could explain elevation of pancreatic enzymes. Alternatively, he could have pancreatitis. CBD is dilated without obvious stones, ? possibly an inflammatory stricture.. Lipase and LFTs normal. He had gastrograffin UGI series this am, awaiting results.   2. Hypokalemia, K+ 2.9. Will defer to admitting team   LOS: 7 days   Willette Cluster  06/30/2012, 11:04 AM  I have personally taken an interval history, reviewed the chart, and examined the patient. Gastrografin study does not demonstrate an obvious walled off perforation. Note  increasing white count. Continue antibiotics.  Barbette Hair. Arlyce Dice, MD, [facility] [phone]

## 2012-06-30 NOTE — Progress Notes (Signed)
CSW met with patient. He understands that he will need snf upon discharge. He is agreeable to [facility] which is the only bed offer at this time. CSW will put in for [facility] auth.  Steven [name] C. Merilynn Haydu MSW, LCSW [phone]

## 2012-06-30 NOTE — Progress Notes (Signed)
ANTIBIOTIC CONSULT NOTE - follow up  Pharmacy Consult for Primaxin  Indication: Bacteremia and peri-pancreatic abscess  Allergies  Allergen Reactions  . Cefazolin Rash  . Penicillins     REACTION: unknown allergy - hx since childhood    Patient Measurements: Height: 5' 1.81" (157 cm) Weight: 142 lb (64.411 kg) IBW/kg (Calculated) : 54.17   Vital Signs: Temp: 98.4 F (36.9 C) (04/23 0642) Temp src: Oral (04/23 0642) BP: 100/69 mmHg (04/23 0642) Pulse Rate: 110 (04/23 0642) Intake/Output from previous day: 04/22 0701 - 04/23 0700 In: 800 [I.V.:800] Out: -  Intake/Output from this shift: Total I/O In: 360 [P.O.:360] Out: -   Labs:  Recent Labs  06/28/12 1740 06/29/12 0450 06/30/12 0510  WBC  --  14.1* 19.7*  HGB  --  10.1* 10.5*  PLT  --  192 227  CREATININE 0.62 0.60 0.56   Estimated Creatinine Clearance: 74.3 ml/min (by C-G formula based on Cr of 0.56). No results found for this basename: VANCOTROUGH, Leodis Binet, VANCORANDOM, GENTTROUGH, GENTPEAK, GENTRANDOM, TOBRATROUGH, TOBRAPEAK, TOBRARND, AMIKACINPEAK, AMIKACINTROU, AMIKACIN,  in the last 72 hours   Microbiology: Recent Results (from the past 720 hour(s))  CULTURE, BLOOD (ROUTINE X 2)     Status: None   Collection Time    06/24/12  4:05 AM      Result Value Range Status   Specimen Description BLOOD LEFT ARM   Final   Special Requests BOTTLES DRAWN AEROBIC AND ANAEROBIC 4CC   Final   Culture  Setup Time 06/24/2012 08:30   Final   Culture     Final   Value: GRAM NEGATIVE RODS     ENTEROCOCCUS SPECIES     Note: Gram Stain Report Called to,Read Back By and Verified With: MONIQUE HOWLETT ON 06/24/2012 AT 11:16P BY WILEJ   Report Status 06/27/2012 FINAL   Final  CULTURE, BLOOD (ROUTINE X 2)     Status: None   Collection Time    06/24/12  4:05 AM      Result Value Range Status   Specimen Description BLOOD RIGHT ANTECUBITAL   Final   Special Requests BOTTLES DRAWN AEROBIC AND ANAEROBIC 5CC   Final   Culture  Setup Time 06/24/2012 08:30   Final   Culture     Final   Value: ENTEROCOCCUS SPECIES     Note: COMBINATION THERAPY OF HIGH DOSE AMPICILLIN OR VANCOMYCIN, PLUS AN AMINOGLYCOSIDE, IS USUALLY INDICATED FOR SERIOUS ENTEROCOCCAL INFECTIONS.     KLEBSIELLA PNEUMONIAE     Note: Gram Stain Report Called to,Read Back By and Verified With: MONIQUE HOWLETT ON 06/24/2012 AT 11:16P   Report Status 06/27/2012 FINAL   Final   Organism ID, Bacteria ENTEROCOCCUS SPECIES   Final   Organism ID, Bacteria KLEBSIELLA PNEUMONIAE   Final  MRSA PCR SCREENING     Status: None   Collection Time    06/24/12  4:52 AM      Result Value Range Status   MRSA by PCR NEGATIVE  NEGATIVE Final   Comment:            The GeneXpert MRSA Assay (FDA     approved for NASAL specimens     only), is one component of a     comprehensive MRSA colonization     surveillance program. It is not     intended to diagnose MRSA     infection nor to guide or     monitor treatment for     MRSA infections.  CULTURE, BLOOD (  ROUTINE X 2)     Status: None   Collection Time    06/27/12  1:01 PM      Result Value Range Status   Specimen Description BLOOD LEFT ARM   Final   Special Requests BOTTLES DRAWN AEROBIC ONLY 1CC   Final   Culture  Setup Time 06/27/2012 17:11   Final   Culture     Final   Value:        BLOOD CULTURE RECEIVED NO GROWTH TO DATE CULTURE WILL BE HELD FOR 5 DAYS BEFORE ISSUING A FINAL NEGATIVE REPORT   Report Status PENDING   Incomplete  CULTURE, BLOOD (ROUTINE X 2)     Status: None   Collection Time    06/27/12  1:21 PM      Result Value Range Status   Specimen Description BLOOD LEFT HAND   Final   Special Requests BOTTLES DRAWN AEROBIC ONLY 1CC   Final   Culture  Setup Time 06/27/2012 17:10   Final   Culture     Final   Value:        BLOOD CULTURE RECEIVED NO GROWTH TO DATE CULTURE WILL BE HELD FOR 5 DAYS BEFORE ISSUING A FINAL NEGATIVE REPORT   Report Status PENDING   Incomplete  URINE CULTURE     Status:  None   Collection Time    06/29/12  6:20 AM      Result Value Range Status   Specimen Description URINE, RANDOM   Final   Special Requests Immunocompromised   Final   Culture  Setup Time 06/29/2012 08:38   Final   Colony Count NO GROWTH   Final   Culture NO GROWTH   Final   Report Status 06/30/2012 FINAL   Final  CLOSTRIDIUM DIFFICILE BY PCR     Status: None   Collection Time    06/29/12  4:45 PM      Result Value Range Status   C difficile by pcr NEGATIVE  NEGATIVE Final    Medical History: Past Medical History  Diagnosis Date  . Lumbar spondylitis   . Anxiety   . Urinary incontinence   . GERD (gastroesophageal reflux disease)   . Stroke   . Chronic pain   . Hypertonicity of bladder   . Fracture of left humerus   . History of alcoholism   . Alcoholic hepatitis 2001  . 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   Marland Kitchen Epistaxis 2001    required cauterization  . Thrombus of left atrial appendage 2003    Medications:  Scheduled:  . clopidogrel  75 mg Oral Q breakfast  . enoxaparin (LOVENOX) injection  40 mg Subcutaneous Q24H  . ferrous sulfate  325 mg Oral TID PC  . imipenem-cilastatin  500 mg Intravenous Q8H  . metoprolol tartrate  50 mg Oral BID  . pantoprazole  40 mg Oral Daily  . potassium chloride  30 mEq Oral BID  . [COMPLETED] potassium chloride  40 mEq Oral BID  . tamsulosin  0.4 mg Oral QHS  . [DISCONTINUED] clopidogrel  75 mg Oral Q breakfast  . [DISCONTINUED] docusate sodium  100 mg Oral BID   Infusions:  . lactated ringers 50 mL/hr at 06/29/12 1647   PRN: acetaminophen, acetaminophen, fentaNYL, HYDROcodone-acetaminophen, menthol-cetylpyridinium, meperidine (DEMEROL) injection, metoCLOPramide (REGLAN) injection, metoCLOPramide, morphine injection, ondansetron (ZOFRAN) IV, ondansetron, phenol, promethazine, [DISCONTINUED] polyethylene glycol Assessment:  62 yo M with enterococcus/klebsiella bacteremia from 4/17 blood cultures.  Patient was on  Vanco/Levaquin/Aztreonam from 4/17-4/20.  Cultures sensitive to ampicillin, however patient has a PCN and Ancef allergy listed.  Per ID, changed to Primaxin on 4/20 but was not started till 4/21  Day #3 Primaxin now  WBC rising but repeat blood cx's negative and patient responding per ID notes  afebrile  Goal of Therapy:  Primaxin per renal function   Plan:  1.) Continue Primaxin 500 mg IV q8h per ID 2.) Monitor renal function  3.) will f/u with ID  Hessie Knows, PharmD, BCPS Pager [phone] 06/30/2012 9:54 AM

## 2012-06-30 NOTE — Progress Notes (Signed)
[facility] PROGRESS NOTE  Steven [name] NFA:[id number] DOB: 07-28-1950 DOA: 06/23/2012 PCP: No primary provider on file.  Assessment/Plan:                                                                                                                1. Left femoral intertrochanteric fracture -  Orthopedics on board. Underwent ORIF of the left femur on 4/18. PT/OT evaluation , plan for SNF placement. Pain control and anticoagulation on board.  2. Sinus tachycardia - not sure what's causing it.  Improved. May be pain related or due to dehydration.  CT angiogram of the chest to rule out PE was negative. Free t4 and TSH within normal limits. Echocardiogram showed good LVEF without any wall abn. His tachycardia improved and he is b blockers.  3. History of CVA with left-sided hemiparesis - initially plavix held  For surgery of the left hip. Will restart plavix .  4. Dementia - no acute issues. 5. Chronic left humerus fracture.; no acute issues.  6. Polymicrobial bacteremia/Leukocytosis - worse this morning to 19. C diff negative. patient is afebrile. Chest x-ray and UA are unremarkable except the chest x-ray showing mild congestion. He was found to have polymicrobial bacteremia with Positive Blood cultures showing enterococcus and klebsiella.   ID consulted for further recommendations. CT abd and pelvis was done and showed necrotizing pancreatitis. Started him on primaxin.   7. Hypokalemia; replete as needed. Will order a magnesium level .  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 Lipase levels within normal limits. LFT'S within normal limits. GI consulted from [place] .  9. DVT prophylaxis on lovenox.  10.  Code Status: full code Family Communication: none at bedside Disposition Plan: SNF when medically stable  PROCEDURE: Open reduction and internal fixation of the left comminuted intertrochanteric femur fracture on 4/18  Consultants:  Cardiology for  tachycardic  Orthopedics   ID Dr Orvan Falconer.  GI Dr. Arlyce Dice  HPI/Subjective: - oriented to self, pleasant and conversant. Denies complaints.   Objective: Filed Vitals:   06/30/12 0000 06/30/12 0400 06/30/12 0642 06/30/12 0800  BP:   100/69   Pulse:   110   Temp:   98.4 F (36.9 C)   TempSrc:   Oral   Resp: 20 16 16 18   Height:      Weight:      SpO2: 94% 94% 90% 91%    Intake/Output Summary (Last 24 hours) at 06/30/12 4696 Last data filed at 06/29/12 1400  Gross per 24 hour  Intake    800 ml  Output      0 ml  Net    800 ml   Filed Weights   06/24/12 0837  Weight: 64.411 kg (142 lb)    Exam:  General: Well-developed well-nourished.   Cardiovascular: S1-S2 heard tachycardic.  Respiratory: No rhonchi or crepitations.  Abdomen: Soft nontender bowel sounds present.   Musculoskeletal: Pain on moving left shoulder and left leg.  Marland Kitchen Neurologic: Has left-sided weakness from old stroke  Data Reviewed: Basic Metabolic Panel:  Recent Labs Lab 06/26/12 0533 06/27/12 0520 06/28/12 0855 06/28/12 1740 06/28/12 1913 06/29/12 0450 06/30/12 0510  NA 131* 132* 133* 134*  --  134* 136  K 3.2* 2.9* 2.9* 2.6* 2.7* 3.1* 2.9*  CL 98 98 102 100  --  101 105  CO2 26 26 25 24   --  23 22  GLUCOSE 125* 138* 108* 130*  --  124* 130*  BUN 22 17 16 18   --  18 21  CREATININE 0.67 0.63 0.62 0.62  --  0.60 0.56  CALCIUM 7.7* 7.8* 7.5* 7.5*  --  7.6* 7.5*  MG  --  2.1 2.3  --   --  2.4  --    Liver Function Tests:  Recent Labs Lab 06/24/12 0405 06/28/12 1740 06/30/12 0510  AST  --  34 28  ALT  --  41 33  ALKPHOS  --  69 70  BILITOT  --  0.6 0.8  PROT  --  5.2* 5.0*  ALBUMIN 3.1* 1.9* 1.9*    Recent Labs Lab 06/28/12 1740  LIPASE 22  AMYLASE 53   CBC:  Recent Labs Lab 06/23/12 2341  06/25/12 0949 06/26/12 0533 06/27/12 0520 06/29/12 0450 06/30/12 0510  WBC 20.7*  < > 13.5* 13.0* 13.1* 14.1* 19.7*  NEUTROABS 18.2*  --   --   --   --   --   --   HGB 17.2*  <  > 11.6* 10.8* 10.3* 10.1* 10.5*  HCT 47.7  < > 33.2* 30.6* 30.2* 30.1* 30.0*  MCV 81.4  < > 83.8 82.9 83.4 82.5 82.2  PLT 193  < > 107* 116* 141* 192 227  < > = values in this interval not displayed. Cardiac Enzymes:  Recent Labs Lab 06/23/12 2341 06/24/12 0405 06/25/12 0950  TROPONINI <0.30 <0.30 <0.30   BNP (last 3 results)  Recent Labs  06/24/12 0405 06/24/12 0551  PROBNP 1874.0* 1830.0*   CBG: No results found for this basename: GLUCAP,  in the last 168 hours  Recent Results (from the past 240 hour(s))  CULTURE, BLOOD (ROUTINE X 2)     Status: None   Collection Time    06/24/12  4:05 AM      Result Value Range Status   Specimen Description BLOOD LEFT ARM   Final   Special Requests BOTTLES DRAWN AEROBIC AND ANAEROBIC 4CC   Final   Culture  Setup Time 06/24/2012 08:30   Final   Culture     Final   Value: GRAM NEGATIVE RODS     ENTEROCOCCUS SPECIES     Note: Gram Stain Report Called to,Read Back By and Verified With: MONIQUE HOWLETT ON 06/24/2012 AT 11:16P BY WILEJ   Report Status 06/27/2012 FINAL   Final  CULTURE, BLOOD (ROUTINE X 2)     Status: None   Collection Time    06/24/12  4:05 AM      Result Value Range Status   Specimen Description BLOOD RIGHT ANTECUBITAL   Final   Special Requests BOTTLES DRAWN AEROBIC AND ANAEROBIC 5CC   Final   Culture  Setup Time 06/24/2012 08:30   Final   Culture     Final   Value: ENTEROCOCCUS SPECIES     Note: COMBINATION THERAPY OF HIGH DOSE AMPICILLIN OR VANCOMYCIN, PLUS AN AMINOGLYCOSIDE, IS USUALLY INDICATED FOR SERIOUS ENTEROCOCCAL INFECTIONS.     KLEBSIELLA PNEUMONIAE     Note: Gram Stain Report Called to,Read Back By and  Verified With: MONIQUE HOWLETT ON 06/24/2012 AT 11:16P   Report Status 06/27/2012 FINAL   Final   Organism ID, Bacteria ENTEROCOCCUS SPECIES   Final   Organism ID, Bacteria KLEBSIELLA PNEUMONIAE   Final  MRSA PCR SCREENING     Status: None   Collection Time    06/24/12  4:52 AM      Result Value Range  Status   MRSA by PCR NEGATIVE  NEGATIVE Final   Comment:            The GeneXpert MRSA Assay (FDA     approved for NASAL specimens     only), is one component of a     comprehensive MRSA colonization     surveillance program. It is not     intended to diagnose MRSA     infection nor to guide or     monitor treatment for     MRSA infections.  CULTURE, BLOOD (ROUTINE X 2)     Status: None   Collection Time    06/27/12  1:01 PM      Result Value Range Status   Specimen Description BLOOD LEFT ARM   Final   Special Requests BOTTLES DRAWN AEROBIC ONLY 1CC   Final   Culture  Setup Time 06/27/2012 17:11   Final   Culture     Final   Value:        BLOOD CULTURE RECEIVED NO GROWTH TO DATE CULTURE WILL BE HELD FOR 5 DAYS BEFORE ISSUING A FINAL NEGATIVE REPORT   Report Status PENDING   Incomplete  CULTURE, BLOOD (ROUTINE X 2)     Status: None   Collection Time    06/27/12  1:21 PM      Result Value Range Status   Specimen Description BLOOD LEFT HAND   Final   Special Requests BOTTLES DRAWN AEROBIC ONLY 1CC   Final   Culture  Setup Time 06/27/2012 17:10   Final   Culture     Final   Value:        BLOOD CULTURE RECEIVED NO GROWTH TO DATE CULTURE WILL BE HELD FOR 5 DAYS BEFORE ISSUING A FINAL NEGATIVE REPORT   Report Status PENDING   Incomplete  URINE CULTURE     Status: None   Collection Time    06/29/12  6:20 AM      Result Value Range Status   Specimen Description URINE, RANDOM   Final   Special Requests Immunocompromised   Final   Culture  Setup Time 06/29/2012 08:38   Final   Colony Count NO GROWTH   Final   Culture NO GROWTH   Final   Report Status 06/30/2012 FINAL   Final     Studies: No results found.  Scheduled Meds: . clopidogrel  75 mg Oral Q breakfast  . enoxaparin (LOVENOX) injection  40 mg Subcutaneous Q24H  . ferrous sulfate  325 mg Oral TID PC  . imipenem-cilastatin  500 mg Intravenous Q8H  . metoprolol tartrate  50 mg Oral BID  . pantoprazole  40 mg Oral Daily   . tamsulosin  0.4 mg Oral QHS   Continuous Infusions: . lactated ringers 50 mL/hr at 06/29/12 1647    Principal Problem:   Closed left hip fracture Active Problems:   CVA   Dementia   Fracture of left humerus   Leucocytosis   Bacteremia due to Enterococcus   Bacteremia due to Klebsiella pneumoniae   Abnormal finding on GI tract imaging  Time spent: 25 min  Pamella Pert  [facility] Pager [phone] If 7PM-7AM, please contact night-coverage at [email or web address], password [facility] 06/30/2012, 8:22 AM  LOS: 7 days

## 2012-06-30 NOTE — Progress Notes (Signed)
CSW submitted auth request for [place] tracks. Dolores Ewing [facility] MSW, LCSW [id number]

## 2012-07-01 DIAGNOSIS — R109 Unspecified abdominal pain: Secondary | ICD-10-CM

## 2012-07-01 DIAGNOSIS — K859 Acute pancreatitis without necrosis or infection, unspecified: Secondary | ICD-10-CM

## 2012-07-01 LAB — CBC
HCT: 29.8 % — ABNORMAL LOW (ref 39.0–52.0)
Hemoglobin: 10.1 g/dL — ABNORMAL LOW (ref 13.0–17.0)
MCH: 28.4 pg (ref 26.0–34.0)
MCHC: 33.9 g/dL (ref 30.0–36.0)
RDW: 13.6 % (ref 11.5–15.5)

## 2012-07-01 LAB — COMPREHENSIVE METABOLIC PANEL
Albumin: 1.9 g/dL — ABNORMAL LOW (ref 3.5–5.2)
Alkaline Phosphatase: 76 U/L (ref 39–117)
BUN: 17 mg/dL (ref 6–23)
Calcium: 7.6 mg/dL — ABNORMAL LOW (ref 8.4–10.5)
GFR calc Af Amer: >90 mL/min (ref >90)
Glucose, Bld: 103 mg/dL — ABNORMAL HIGH (ref 70–99)
Potassium: 3.1 mEq/L — ABNORMAL LOW (ref 3.5–5.1)
Total Protein: 5 g/dL — ABNORMAL LOW (ref 6.0–8.3)

## 2012-07-01 MED ORDER — ENSURE COMPLETE PO LIQD: 237.0000 mL | Freq: Three times a day (TID) | ORAL | Status: DC

## 2012-07-01 MED ORDER — POTASSIUM CHLORIDE CRYS ER 20 MEQ PO TBCR
40.0000 meq | EXTENDED_RELEASE_TABLET | Freq: Once | ORAL | Status: DC
  Filled 2012-07-01: qty 2

## 2012-07-01 NOTE — Progress Notes (Signed)
INITIAL NUTRITION ASSESSMENT  DOCUMENTATION CODES Per approved criteria  -Not Applicable   INTERVENTION: - Ensure Complete TID - Nursing to continue to encourage increased PO intake - Will continue to monitor   NUTRITION DIAGNOSIS: Inadequate oral intake related to pt refusing meals as evidenced by RN report.   Goal: Pt to consume >90% of meals/supplements.   Monitor:  Weights, labs, intake  Reason for Assessment: Poor intake  61 y.o. male  Admitting Dx: Closed left hip fracture  ASSESSMENT: Pt seen by RD earlier in admission. Pt had reported at that time stable weight and good appetite PTA. Per RN, pt has been eating poorly recently. Pt refused breakfast this morning. Met with pt who was eating some applesauce. When asked what he would like to eat, he replied "nothing". Pt agreeable to trying Ensure Complete - pt had a few sips of chocolate Ensure complete during RD visit. Pt followed by SLP.   Height: Ht Readings from Last 1 Encounters:  06/24/12 5' 1.81" (1.57 m)    Weight: Wt Readings from Last 1 Encounters:  06/24/12 142 lb (64.411 kg)    Ideal Body Weight: 118 lb  % Ideal Body Weight: 120  Wt Readings from Last 10 Encounters:  06/24/12 142 lb (64.411 kg)  06/24/12 142 lb (64.411 kg)  01/02/10 138 lb (62.596 kg)    Usual Body Weight: 142 lb  % Usual Body Weight: 100  BMI:  Body mass index is 26.13 kg/(m^2).  Estimated Nutritional Needs: Kcal: 1300-1600 Protein: 65-80g Fluid: 1.3-1.6L/day  Skin: +1 LLE edema  Diet Order: Dysphagia 1, thin  EDUCATION NEEDS: -No education needs identified at this time   Intake/Output Summary (Last 24 hours) at 07/01/12 1210 Last data filed at 07/01/12 0541  Gross per 24 hour  Intake 1064.16 ml  Output    550 ml  Net 514.16 ml    Last BM: 4/24  Labs:   Recent Labs Lab 06/27/12 0520 06/28/12 0855  06/29/12 0450 06/30/12 0510 07/01/12 0528  NA 132* 133*  < > 134* 136 138  K 2.9* 2.9*  < > 3.1*  2.9* 3.1*  CL 98 102  < > 101 105 105  CO2 26 25  < > 23 22 24   BUN 17 16  < > 18 21 17   CREATININE 0.63 0.62  < > 0.60 0.56 0.59  CALCIUM 7.8* 7.5*  < > 7.6* 7.5* 7.6*  MG 2.1 2.3  --  2.4  --   --   GLUCOSE 138* 108*  < > 124* 130* 103*  < > = values in this interval not displayed.  CBG (last 3)  No results found for this basename: GLUCAP,  in the last 72 hours  Scheduled Meds: . clopidogrel  75 mg Oral Q breakfast  . enoxaparin (LOVENOX) injection  40 mg Subcutaneous Q24H  . ferrous sulfate  325 mg Oral TID PC  . imipenem-cilastatin  500 mg Intravenous Q8H  . metoprolol tartrate  50 mg Oral BID  . pantoprazole  40 mg Oral Daily  . potassium chloride  40 mEq Oral Once  . tamsulosin  0.4 mg Oral QHS    Continuous Infusions: . lactated ringers 50 mL/hr at 06/30/12 1433    Past Medical History  Diagnosis Date  . Lumbar spondylitis   . Anxiety   . Urinary incontinence   . GERD (gastroesophageal reflux disease)   . Stroke   . Chronic pain   . Hypertonicity of bladder   . Fracture  of left humerus   . History of alcoholism   . Alcoholic hepatitis 2001  . 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   Marland Kitchen Epistaxis 2001    required cauterization  . Thrombus of left atrial appendage 2003    Past Surgical History  Procedure Laterality Date  . Femur im nail Left 06/25/2012    Procedure: INTRAMEDULLARY (IM) NAIL FEMORAL;  Surgeon: Shelda Pal, MD;  Location: [facility];  Service: Orthopedics;  Laterality: Left;  . Knee surgery  1996  . Hand surgery Bilateral 2003    crush injuries with multitrauma     Levon Hedger MS, RD, LDN [phone] Pager [phone] After Hours Pager

## 2012-07-01 NOTE — Progress Notes (Signed)
Speech Language Pathology Dysphagia Treatment Patient Details Name: Steven [name] MRN: [id number] DOB: [birth year] Today's Date: 07/01/2012 Time: 1478-2956 SLP Time Calculation (min): 16 min  Assessment / Plan / Recommendation Clinical Impression  Pt seen to assess tolerance of po diet and for further education re: dysphagia, asp precautions.  Sister Steven [name] not present today unfortunately.  Frequent "belching" noted from pt when elevating pt's HOB for positioning - which sister yesterday had stated has been ongoing.    Observed pt consuming icecream and water - delayed cough x1 noted with icecream.  Pt self feeding icecream and drinking water today without assistance after set up provided, but may eat more if provided with assistance during meals due to hemiplegia.   Pt required mod cueing to take small bites/sips.    Rec continue modified diet due to ongoing multifactorial dysphagia and increased asp risk due to current medical illness/lack of mobiilty from hip fx.      Based on pt's sister, Steven [name]'s, report yesterday, pt has been coughing with intake for sometime - suspect component of chronic asp for which pt has managed until this event.  Pt enjoys consuming icecream,  ? if he would enjoy Magic Cup - vanilla flavor? - defer to dietician.- Pt's sister had previous stated that this pt likes anything with sweet flavor- likely due to gustatory sensory impairment from cognitive loss.      Suspect intake will continue to be poor but pt is at increased aspiration risk with solids requiring mastication.  Question if MD would desire to advance diet to soft solids for comfort with known increased risk to try  to improve intake?  SLP to follow brielfy further to help maximize pt's airway protection with intake.     Diet Recommendation  Continue with Current Diet: Dysphagia 1 (puree);Thin liquid    SLP Plan Continue with current plan of care   Pertinent Vitals/Pain Low grade temp earlier   Swallowing  Goals  SLP Swallowing Goals Swallow Study Goal #2 - Progress: Progressing toward goal  General    Oral Cavity - Oral Hygiene   pt needs oral care, poor dentition  Dysphagia Treatment Treatment focused on: Skilled observation of diet tolerance;Patient/family/caregiver education Treatment Methods/Modalities: Skilled observation Type of PO's observed: Thin liquids;Dysphagia 1 (puree) (icecream) Feeding: Needs set up;Needs assist Liquids provided via: Cup Oral Phase Signs & Symptoms: Prolonged oral phase;Prolonged bolus formation Pharyngeal Phase Signs & Symptoms: Suspected delayed swallow initiation;Delayed cough Type of cueing: Verbal Amount of cueing: Moderate (to take small bites/sips)   GO     Steven Burnet, MS [facility] SLP [phone]

## 2012-07-01 NOTE — Progress Notes (Signed)
[facility] PROGRESS NOTE  Steven [name] ZOX:[id number] DOB: Oct 15, 1950 DOA: 06/23/2012 PCP: No primary provider on file.  Assessment/Plan:                                                                                                                1. 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 Unclear etiology. Lipase levels within normal limits. LFT'S within normal limits. GI consulted from [place]. May be with peripancreatic abscess. Low grade temperature this morning with worsening leukocytosis; will probably need surgery involvement soon for evaluation of debridement. Still has abdominal pain and poor po intake.  2. Left femoral intertrochanteric fracture -  Orthopedics on board. Underwent ORIF of the left femur on 4/18. PT/OT evaluation , plan for SNF placement. Pain control and anticoagulation on board.  3. Sinus tachycardia - May be pain, related due to dehydration or most likely due to #1.  CT angiogram of the chest to rule out PE was negative. Free t4 and TSH within normal limits. Echocardiogram showed good LVEF without any wall abn. His tachycardia improved and he is b blockers.  4. History of CVA with left-sided hemiparesis - initially plavix held  For surgery of the left hip. Will restart plavix .  5. Dementia - no acute issues. 6. Chronic left humerus fracture.; no acute issues.  7. Polymicrobial bacteremia/Leukocytosis - C diff negative. patient is afebrile. Chest x-ray and UA are unremarkable except the chest x-ray showing mild congestion. He was found to have polymicrobial bacteremia with Positive Blood cultures showing enterococcus and klebsiella. ID consulted for further recommendations. CT abd and pelvis was done and showed necrotizing pancreatitis. Started him on primaxin.   8. Hypokalemia; replete as needed. Will order a magnesium level .  9. DVT prophylaxis on lovenox.   Code Status: full code Family Communication: none at bedside Disposition Plan: SNF when  medically stable  PROCEDURE: Open reduction and internal fixation of the left comminuted intertrochanteric femur fracture on 4/18  Consultants:  Cardiology for tachycardic  Orthopedics   ID Dr Orvan Falconer.  GI Dr. Arlyce Dice  HPI/Subjective: - no acute complaints but endorses worsening abdominal pain when asked and poor appetite.  Objective: Filed Vitals:   06/30/12 1600 06/30/12 2203 07/01/12 0542 07/01/12 0800  BP:  117/83 132/77   Pulse:  105 103   Temp:  98.9 F (37.2 C) 100 F (37.8 C)   TempSrc:  Oral Oral   Resp: 16 18 18 18   Height:      Weight:      SpO2: 94% 94% 94% 95%    Intake/Output Summary (Last 24 hours) at 07/01/12 0813 Last data filed at 07/01/12 0541  Gross per 24 hour  Intake 1424.16 ml  Output    550 ml  Net 874.16 ml   Filed Weights   06/24/12 0837  Weight: 64.411 kg (142 lb)    Exam:  General: Well-developed well-nourished.  Cardiovascular: S1-S2 heard tachycardic.  Respiratory: No rhonchi or crepitations.  Abdomen: Soft with mild tenderness bowel sounds present.  Musculoskeletal: Pain on moving left shoulder and left leg.  Neurologic: Has left-sided weakness from old stroke  Data Reviewed: Basic Metabolic Panel:  Recent Labs Lab 06/26/12 0533 06/27/12 0520 06/28/12 0855 06/28/12 1740 06/28/12 1913 06/29/12 0450 06/30/12 0510 07/01/12 0528  NA 131* 132* 133* 134*  --  134* 136 138  K 3.2* 2.9* 2.9* 2.6* 2.7* 3.1* 2.9* 3.1*  CL 98 98 102 100  --  101 105 105  CO2 26 26 25 24   --  23 22 24   GLUCOSE 125* 138* 108* 130*  --  124* 130* 103*  BUN 22 17 16 18   --  18 21 17   CREATININE 0.67 0.63 0.62 0.62  --  0.60 0.56 0.59  CALCIUM 7.7* 7.8* 7.5* 7.5*  --  7.6* 7.5* 7.6*  MG  --  2.1 2.3  --   --  2.4  --   --    Liver Function Tests:  Recent Labs Lab 06/28/12 1740 06/30/12 0510 07/01/12 0528  AST 34 28 53*  ALT 41 33 43  ALKPHOS 69 70 76  BILITOT 0.6 0.8 0.9  PROT 5.2* 5.0* 5.0*  ALBUMIN 1.9* 1.9* 1.9*    Recent  Labs Lab 06/28/12 1740  LIPASE 22  AMYLASE 53   CBC:  Recent Labs Lab 06/26/12 0533 06/27/12 0520 06/29/12 0450 06/30/12 0510 07/01/12 0528  WBC 13.0* 13.1* 14.1* 19.7* 22.9*  HGB 10.8* 10.3* 10.1* 10.5* 10.1*  HCT 30.6* 30.2* 30.1* 30.0* 29.8*  MCV 82.9 83.4 82.5 82.2 83.7  PLT 116* 141* 192 227 259   Cardiac Enzymes:  Recent Labs Lab 06/25/12 0950  TROPONINI <0.30   BNP (last 3 results)  Recent Labs  06/24/12 0405 06/24/12 0551  PROBNP 1874.0* 1830.0*   Recent Results (from the past 240 hour(s))  CULTURE, BLOOD (ROUTINE X 2)     Status: None   Collection Time    06/24/12  4:05 AM      Result Value Range Status   Specimen Description BLOOD LEFT ARM   Final   Special Requests BOTTLES DRAWN AEROBIC AND ANAEROBIC 4CC   Final   Culture  Setup Time 06/24/2012 08:30   Final   Culture     Final   Value: GRAM NEGATIVE RODS     ENTEROCOCCUS SPECIES     Note: Gram Stain Report Called to,Read Back By and Verified With: MONIQUE HOWLETT ON 06/24/2012 AT 11:16P BY WILEJ   Report Status 06/27/2012 FINAL   Final  CULTURE, BLOOD (ROUTINE X 2)     Status: None   Collection Time    06/24/12  4:05 AM      Result Value Range Status   Specimen Description BLOOD RIGHT ANTECUBITAL   Final   Special Requests BOTTLES DRAWN AEROBIC AND ANAEROBIC 5CC   Final   Culture  Setup Time 06/24/2012 08:30   Final   Culture     Final   Value: ENTEROCOCCUS SPECIES     Note: COMBINATION THERAPY OF HIGH DOSE AMPICILLIN OR VANCOMYCIN, PLUS AN AMINOGLYCOSIDE, IS USUALLY INDICATED FOR SERIOUS ENTEROCOCCAL INFECTIONS.     KLEBSIELLA PNEUMONIAE     Note: Gram Stain Report Called to,Read Back By and Verified With: MONIQUE HOWLETT ON 06/24/2012 AT 11:16P   Report Status 06/27/2012 FINAL   Final   Organism ID, Bacteria ENTEROCOCCUS SPECIES   Final   Organism ID, Bacteria KLEBSIELLA PNEUMONIAE   Final  MRSA PCR SCREENING     Status: None   Collection Time  06/24/12  4:52 AM      Result Value Range  Status   MRSA by PCR NEGATIVE  NEGATIVE Final   Comment:            The GeneXpert MRSA Assay (FDA     approved for NASAL specimens     only), is one component of a     comprehensive MRSA colonization     surveillance program. It is not     intended to diagnose MRSA     infection nor to guide or     monitor treatment for     MRSA infections.  CULTURE, BLOOD (ROUTINE X 2)     Status: None   Collection Time    06/27/12  1:01 PM      Result Value Range Status   Specimen Description BLOOD LEFT ARM   Final   Special Requests BOTTLES DRAWN AEROBIC ONLY 1CC   Final   Culture  Setup Time 06/27/2012 17:11   Final   Culture     Final   Value:        BLOOD CULTURE RECEIVED NO GROWTH TO DATE CULTURE WILL BE HELD FOR 5 DAYS BEFORE ISSUING A FINAL NEGATIVE REPORT   Report Status PENDING   Incomplete  CULTURE, BLOOD (ROUTINE X 2)     Status: None   Collection Time    06/27/12  1:21 PM      Result Value Range Status   Specimen Description BLOOD LEFT HAND   Final   Special Requests BOTTLES DRAWN AEROBIC ONLY 1CC   Final   Culture  Setup Time 06/27/2012 17:10   Final   Culture     Final   Value:        BLOOD CULTURE RECEIVED NO GROWTH TO DATE CULTURE WILL BE HELD FOR 5 DAYS BEFORE ISSUING A FINAL NEGATIVE REPORT   Report Status PENDING   Incomplete  URINE CULTURE     Status: None   Collection Time    06/29/12  6:20 AM      Result Value Range Status   Specimen Description URINE, RANDOM   Final   Special Requests Immunocompromised   Final   Culture  Setup Time 06/29/2012 08:38   Final   Colony Count NO GROWTH   Final   Culture NO GROWTH   Final   Report Status 06/30/2012 FINAL   Final  CLOSTRIDIUM DIFFICILE BY PCR     Status: None   Collection Time    06/29/12  4:45 PM      Result Value Range Status   C difficile by pcr NEGATIVE  NEGATIVE Final     Studies: Dg Ugi W/water Sol Cm  06/30/2012  *RADIOLOGY REPORT*  Clinical Data:Pancreatitis.  Peripancreatic abscess.  Duodenal edema.   ESOPHAGUS/BARIUM SWALLOW/TABLET STUDY  Fluoroscopy Time: 4 minutes 14 seconds slow pulsed fluoroscopy  Comparison: CT scan dated 06/27/2012  Findings: Scout image demonstrates slight dilatation of the single small bowel loop in the mid abdomen.  This is suggestive of a localized ileus.  The patient ingested water-soluble contrast.  The contrast passed into the normal appearing pylorus and duodenal bulb.  There is marked edema of the mucosa of the second and third portions of the duodenum.  However, contrast did pass through that area.  There is no visible duodenal ulcer.  No extravasation of contrast.  IMPRESSION:   Marked edema of the mucosa of the second and third portions of the duodenum without evidence of a duodenal  ulcer.  The edema is most likely secondary to the adjacent pancreatitis.   Original Report Authenticated By: Francene Boyers, M.D.     Scheduled Meds: . clopidogrel  75 mg Oral Q breakfast  . enoxaparin (LOVENOX) injection  40 mg Subcutaneous Q24H  . ferrous sulfate  325 mg Oral TID PC  . imipenem-cilastatin  500 mg Intravenous Q8H  . metoprolol tartrate  50 mg Oral BID  . pantoprazole  40 mg Oral Daily  . tamsulosin  0.4 mg Oral QHS   Continuous Infusions: . lactated ringers 50 mL/hr at 06/30/12 1433    Principal Problem:   Closed left hip fracture Active Problems:   CVA   Dementia   Fracture of left humerus   Leucocytosis   Bacteremia due to Enterococcus   Bacteremia due to Klebsiella pneumoniae   Abnormal finding on GI tract imaging  Time spent: 25 min  Pamella Pert  [facility] Pager [phone] If 7PM-7AM, please contact night-coverage at [email or web address], password [facility] 07/01/2012, 8:13 AM  LOS: 8 days

## 2012-07-01 NOTE — Consult Note (Signed)
Seen and examined.  No acute surgical need.  Not septic.  May need FNA of area of pancreas if he worsens.  Cont abx for now.

## 2012-07-01 NOTE — Progress Notes (Signed)
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  Called to room by RN, pt requesting to get up.  Assisted pt OOB via squat pivot total assist pt 20%.  Positioned in recliner.     Follow Up Recommendations  SNF     Does the patient have the potential to tolerate intense rehabilitation     Barriers to Discharge        Equipment Recommendations  None recommended by PT    Recommendations for Other Services    Frequency Min 3X/week   Plan      Precautions / Restrictions Precautions Precautions: Fall Precaution Comments: L hemiparesis s/p CVA and L chronic humerus fx Restrictions Weight Bearing Restrictions: Yes LLE Weight Bearing: Partial weight bearing LLE Partial Weight Bearing Percentage or Pounds: 50%   Pertinent Vitals/Pain No c/o pain    Mobility  Bed Mobility Bed Mobility: Supine to Sit Supine to Sit: 1: +2 Total assist Supine to Sit: Patient Percentage: 40% Details for Bed Mobility Assistance: Pt assisted with pad to turn in bed and bring LEs over side.  Pt voluntarily grasping rail and assisting self to upright sitting Transfers Transfers: Stand Pivot Transfers Sit to Stand: 1: +1 Total assist;From bed Stand to Sit: 1: +1 Total assist;To chair/3-in-1 Details for Transfer Assistance: using "Bear Hug" stand pivot to pt's R from bed to [facility] then to recliner total assist +1 pt 40%    PT Goals                                                            progressing    Visit Information  Last PT Received On: 07/01/12 Assistance Needed: +2    Subjective Data  Subjective: I want to get up   Cognition    impaired   Balance   poor  End of Session PT - End of Session Equipment Utilized  During Treatment: Gait belt Activity Tolerance: Patient tolerated treatment well Patient left: in chair;with call bell/phone within reach Nurse Communication: Need for lift equipment;Mobility status   [name]  PTA [facility]  Acute  Rehab Pager      [phone]

## 2012-07-01 NOTE — Consult Note (Addendum)
Reason for Consult: Pancreatitis Referring Physician: Dr. Melvia Heaps CC; fall with fx left hip  Steven [name] is an 62 y.o. male.  HPI: Patient is a 61y/o male who was admitted from a skilled nursing facility after an unwitnessed fall and fx to left Hip. He underwent ORIF left hip on 06/24/12 by Dr. Charlann Boxer.  On admission pt. had some hypotension and an abnormal EKG. He was found to have + blood cultures for  Klebsiella pneumonia and  Enterococcus; both sensitive to ampicillin. This led to a CT exam to look for a source.  CT showed:  Moderate pancreatic and peripancreatic edema, most consistent  with pancreatitis. Ill defined gas "collection" in the region of the pancreatic head/uncinate process. 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 Differential considerations include extraluminal air from the adjacent duodenum. There was also a SMA vein thrombosis, which may be consistent with an infectious etiology.there was also some biliary ductal dilatations, no stone was seen.NO peripancreatic fluid seen. He was seen in consultation by Dr. Arlyce Dice of [facility]. It was his opinion the patient had pancreatitis which was probably from bacteremia common bile duct was dilated with no obvious stones lipase and LFTs were normal. Dr. Arlyce Dice was also concerned that there could be a possible abscess in the area of the head of the pancreas which could be due to a posterior penetrating ulcer with a the walled off abscess. He recommended a Gastrografin upper GI which was done today. This showed marked edema of the mucosa of the second third portions of the duodenum without evidence of duodenal ulcer. It was their opinion the pancreatitis was the cause for the duodenal edema.Marland Kitchen LFTs remained normal, . His diet has been advanced from clear liquids to dysphagia 1. He denies any abdominal pain and is tolerating PO's well.  We were asked to see for possible necrotizing pancreatitis.  He has significant  dementia and no history could be obtained. All answered were yes or no, and were not reliable.    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    Abnormal EKG with possible prior MI;  2D Echo shows normal EF 60-65% this admit.    Fracture of left humerus chronic by hx   . Lumbar spondylitis   . Anxiety   . Urinary incontinence/BPH  Hypertonicity of bladder       History of alcoholism       Alcoholic hepatitis     . Chronic pain    Substance abuse 2001   MJA and cocaine.      Marland Kitchen  GERD (gastroesophageal reflux disease)       Epistaxis  required cauterization         Thrombus of left atrial appendage 2003   2001          Past Surgical History  Procedure Laterality Date  . Femur im nail Left 06/25/2012    Procedure: INTRAMEDULLARY (IM) NAIL FEMORAL;  Surgeon: Shelda Pal, MD;  Location: [facility];  Service: Orthopedics;  Laterality: Left;  . Knee surgery  1996  . Hand surgery Bilateral 2003    crush injuries with multitrauma    Family History  Problem Relation Age of Onset  . Diabetes type II Mother   . Hypertension Mother   . Diabetes type II Father   . Hypertension Father   . Dementia  Father     Social History:  reports that he has never smoked. He does not have any smokeless tobacco history on file. He reports that he does not drink alcohol or use illicit drugs.  Allergies:  Allergies  Allergen Reactions  . Cefazolin Rash  . Penicillins     REACTION: unknown allergy - hx since childhood    Medications:  Prior to Admission:  Prescriptions prior to admission  Medication Sig Dispense Refill  . cholecalciferol (VITAMIN D) 1000 UNITS tablet Take 1,000 Units by mouth every morning.       . clopidogrel (PLAVIX) 75 MG tablet Take 75 mg by mouth every morning.       . Multiple Vitamin (MULITIVITAMIN WITH MINERALS) TABS Take 1 tablet by mouth every morning.       Marland Kitchen  omeprazole (PRILOSEC) 20 MG capsule Take 40 mg by mouth every morning.       . tamsulosin (FLOMAX) 0.4 MG CAPS Take 0.4 mg by mouth at bedtime.       Scheduled: . clopidogrel  75 mg Oral Q breakfast  . enoxaparin (LOVENOX) injection  40 mg Subcutaneous Q24H  . feeding supplement  237 mL Oral TID WC  . ferrous sulfate  325 mg Oral TID PC  . imipenem-cilastatin  500 mg Intravenous Q8H  . metoprolol tartrate  50 mg Oral BID  . pantoprazole  40 mg Oral Daily  . potassium chloride  40 mEq Oral Once  . tamsulosin  0.4 mg Oral QHS   Continuous: . lactated ringers 50 mL/hr at 07/01/12 1244   ZOX:[id number], acetaminophen, fentaNYL, HYDROcodone-acetaminophen, menthol-cetylpyridinium, meperidine (DEMEROL) injection, metoCLOPramide (REGLAN) injection, metoCLOPramide, morphine injection, ondansetron (ZOFRAN) IV, ondansetron, phenol, promethazine Anti-infectives   Start     Dose/Rate Route Frequency Ordered Stop   06/28/12 1630  imipenem-cilastatin (PRIMAXIN) 500 mg in sodium chloride 0.9 % 100 mL IVPB     500 mg 200 mL/hr over 30 Minutes Intravenous Every 8 hours 06/28/12 1617     06/28/12 0800  imipenem-cilastatin (PRIMAXIN) 500 mg in sodium chloride 0.9 % 100 mL IVPB  Status:  Discontinued     500 mg 200 mL/hr over 30 Minutes Intravenous Every 8 hours 06/28/12 0718 06/28/12 1030   06/27/12 1400  metroNIDAZOLE (FLAGYL) tablet 500 mg  Status:  Discontinued     500 mg Oral 3 times per day 06/27/12 1032 06/27/12 1229   06/26/12 2000  vancomycin (VANCOCIN) IVPB 1000 mg/200 mL premix  Status:  Discontinued     1,000 mg 200 mL/hr over 60 Minutes Intravenous Every 8 hours 06/26/12 1200 06/27/12 1229   06/26/12 1215  vancomycin (VANCOCIN) IVPB 1000 mg/200 mL premix     1,000 mg 200 mL/hr over 60 Minutes Intravenous  Once 06/26/12 1203 06/26/12 1315   06/25/12 0800  levofloxacin (LEVAQUIN) IVPB 750 mg  Status:  Discontinued     750 mg 100 mL/hr over 90 Minutes Intravenous Every 24 hours 06/24/12  1014 06/27/12 1039   06/25/12 0700  clindamycin (CLEOCIN) IVPB 900 mg  Status:  Discontinued     900 mg 100 mL/hr over 30 Minutes Intravenous  Once 06/25/12 0912 06/27/12 1205   06/24/12 2200  vancomycin (VANCOCIN) IVPB 750 mg/150 ml premix  Status:  Discontinued     750 mg 150 mL/hr over 60 Minutes Intravenous Every 12 hours 06/24/12 1014 06/26/12 1200   06/24/12 1400  aztreonam (AZACTAM) 1 g in dextrose 5 % 50 mL IVPB  Status:  Discontinued  1 g 100 mL/hr over 30 Minutes Intravenous 3 times per day 06/24/12 1014 06/27/12 1229   06/24/12 0415  levofloxacin (LEVAQUIN) IVPB 750 mg     750 mg 100 mL/hr over 90 Minutes Intravenous  Once 06/24/12 0400 06/24/12 0557   06/24/12 0415  vancomycin (VANCOCIN) IVPB 1000 mg/200 mL premix     1,000 mg 200 mL/hr over 60 Minutes Intravenous  Once 06/24/12 0400 06/24/12 0654   06/24/12 0415  aztreonam (AZACTAM) 2 g in dextrose 5 % 50 mL IVPB     2 g 100 mL/hr over 30 Minutes Intravenous  Once 06/24/12 0409 06/24/12 0624      Results for orders placed during the hospital encounter of 06/23/12 (from the past 48 hour(s))  CLOSTRIDIUM DIFFICILE BY PCR     Status: None   Collection Time    06/29/12  4:45 PM      Result Value Range   C difficile by pcr NEGATIVE  NEGATIVE  CBC     Status: Abnormal   Collection Time    06/30/12  5:10 AM      Result Value Range   WBC 19.7 (*) 4.0 - 10.5 K/uL   RBC 3.65 (*) 4.22 - 5.81 MIL/uL   Hemoglobin 10.5 (*) 13.0 - 17.0 g/dL   HCT 16.1 (*) 09.6 - 04.5 %   MCV 82.2  78.0 - 100.0 fL   MCH 28.8  26.0 - 34.0 pg   MCHC 35.0  30.0 - 36.0 g/dL   RDW 40.9  81.1 - 91.4 %   Platelets 227  150 - 400 K/uL  COMPREHENSIVE METABOLIC PANEL     Status: Abnormal   Collection Time    06/30/12  5:10 AM      Result Value Range   Sodium 136  135 - 145 mEq/L   Potassium 2.9 (*) 3.5 - 5.1 mEq/L   Chloride 105  96 - 112 mEq/L   CO2 22  19 - 32 mEq/L   Glucose, Bld 130 (*) 70 - 99 mg/dL   BUN 21  6 - 23 mg/dL   Creatinine,  Ser 7.82  0.50 - 1.35 mg/dL   Calcium 7.5 (*) 8.4 - 10.5 mg/dL   Total Protein 5.0 (*) 6.0 - 8.3 g/dL   Albumin 1.9 (*) 3.5 - 5.2 g/dL   AST 28  0 - 37 U/L   ALT 33  0 - 53 U/L   Alkaline Phosphatase 70  39 - 117 U/L   Total Bilirubin 0.8  0.3 - 1.2 mg/dL   GFR calc non Af Amer >90  >90 mL/min   GFR calc Af Amer >90  >90 mL/min   Comment:            The eGFR has been calculated     using the CKD EPI equation.     This calculation has not been     validated in all clinical     situations.     eGFR's persistently     <90 mL/min signify     possible Chronic Kidney Disease.  CBC     Status: Abnormal   Collection Time    07/01/12  5:28 AM      Result Value Range   WBC 22.9 (*) 4.0 - 10.5 K/uL   RBC 3.56 (*) 4.22 - 5.81 MIL/uL   Hemoglobin 10.1 (*) 13.0 - 17.0 g/dL   HCT 95.6 (*) 21.3 - 08.6 %   MCV 83.7  78.0 - 100.0 fL  MCH 28.4  26.0 - 34.0 pg   MCHC 33.9  30.0 - 36.0 g/dL   RDW 16.1  09.6 - 04.5 %   Platelets 259  150 - 400 K/uL  COMPREHENSIVE METABOLIC PANEL     Status: Abnormal   Collection Time    07/01/12  5:28 AM      Result Value Range   Sodium 138  135 - 145 mEq/L   Potassium 3.1 (*) 3.5 - 5.1 mEq/L   Chloride 105  96 - 112 mEq/L   CO2 24  19 - 32 mEq/L   Glucose, Bld 103 (*) 70 - 99 mg/dL   BUN 17  6 - 23 mg/dL   Creatinine, Ser 4.09  0.50 - 1.35 mg/dL   Calcium 7.6 (*) 8.4 - 10.5 mg/dL   Total Protein 5.0 (*) 6.0 - 8.3 g/dL   Albumin 1.9 (*) 3.5 - 5.2 g/dL   AST 53 (*) 0 - 37 U/L   ALT 43  0 - 53 U/L   Alkaline Phosphatase 76  39 - 117 U/L   Total Bilirubin 0.9  0.3 - 1.2 mg/dL   GFR calc non Af Amer >90  >90 mL/min   GFR calc Af Amer >90  >90 mL/min   Comment:            The eGFR has been calculated     using the CKD EPI equation.     This calculation has not been     validated in all clinical     situations.     eGFR's persistently     <90 mL/min signify     possible Chronic Kidney Disease.    Dg Ugi W/water Sol Cm  06/30/2012  *RADIOLOGY  REPORT*  Clinical Data:Pancreatitis.  Peripancreatic abscess.  Duodenal edema.  ESOPHAGUS/BARIUM SWALLOW/TABLET STUDY  Fluoroscopy Time: 4 minutes 14 seconds slow pulsed fluoroscopy  Comparison: CT scan dated 06/27/2012  Findings: Scout image demonstrates slight dilatation of the single small bowel loop in the mid abdomen.  This is suggestive of a localized ileus.  The patient ingested water-soluble contrast.  The contrast passed into the normal appearing pylorus and duodenal bulb.  There is marked edema of the mucosa of the second and third portions of the duodenum.  However, contrast did pass through that area.  There is no visible duodenal ulcer.  No extravasation of contrast.  IMPRESSION:   Marked edema of the mucosa of the second and third portions of the duodenum without evidence of a duodenal ulcer.  The edema is most likely secondary to the adjacent pancreatitis.   Original Report Authenticated By: Francene Boyers, M.D.     Review of Systems  Unable to perform ROS: dementia   Blood pressure 132/77, pulse 109, temperature 98.7 F (37.1 C), temperature source Oral, resp. rate 18, height 5' 1.81" (1.57 m), weight 64.411 kg (142 lb), SpO2 93.00%. Physical Exam  Constitutional:  Chronically ill male with left hemiparesis, and dementia. Where are you?: [facility]  Why are you here?: Visiting, what year? i don't know  Where does he live?  [address]  Do you live in a nursing home?:  No Do you have a car?  No   How do you get around?: Drive He doesn't remember anything about falling or why he's here right now.  HENT:  Head: Normocephalic and atraumatic.  Mouth/Throat: No oropharyngeal exudate.  Eyes: EOM are normal. Pupils are equal, round, and reactive to light. Right eye exhibits no discharge.  Left eye exhibits no discharge. No scleral icterus.  Neck: Normal range of motion. Neck supple. No JVD present. Tracheal deviation present. No thyromegaly present.  Cardiovascular: Normal rate, regular  rhythm, normal heart sounds and intact distal pulses.  Exam reveals no gallop.   No murmur heard. Respiratory: Effort normal and breath sounds normal. No respiratory distress. He has no wheezes. He has no rales. He exhibits no tenderness.  GI: Soft. Bowel sounds are normal. He exhibits no distension and no mass. There is no tenderness. There is no rebound and no guarding.  When I ask him where he hurts, he indicated his left hip. Later when i tried palpating him and ask where it hurt he said yes to everything, including head, chest, abdomen and legs.  Genitourinary:  Condom cath is off and he's incontinent.  Musculoskeletal: He exhibits no edema and no tenderness.  He has some contracture of his left hand and foot.  When i was looking at his foot he said it was: screwed up.  Lymphadenopathy:    He has no cervical adenopathy.  Neurological: He is alert. No cranial nerve deficit.  Skin: Skin is warm and dry. No rash noted. No erythema.  Psychiatric:  Normal mood, he is unable to add anything to his history. He's not sure what is going on right now.l    Assessment/Plan: 1. Pancreatitis with rising WBC on a dysphasia 1 diet. 2. Positive bacteremia of uncertain etiology. Been followed by ID. 3. 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 4. Significant dementia; currently resides at a skilled nursing facility. 5. Prior history of alcohol and substance abuse. 6. Follow with left hip ORIF 4/17 Dr. Charlann Boxer 7. Urinary incontinence/hypertonicity of the bladder. 8. Prior fracture of the left humerus, with history of multiple trauma/crush fracture. 9. Lumbar spondylitis  Plan: Currently the patient is asymptomatic, tolerating by mouth as well. Review CT tomorrow, if there is a fluid collection recommend IR aspiration. Continue antibiotic therapy, and if he become symptomatic consider making him n.p.o.   We will follow with you. He is currently not a surgical  candidate.  Steven [name] 07/01/2012, 12:58 PM

## 2012-07-01 NOTE — Progress Notes (Signed)
Patient ID: Steven [name], male   DOB: [birth year], 62 y.o.   MRN: [id number] [facility] Progress Note  Subjective: Up in chair... Answers yes to everything..hungry and wants food. Says he is hurting but cannot quantify  WBC up to 22  UGI- edema of 2nd and 3rd duodenum felt secondary to pancreatitis ,no ulcer Day #5 Imipenum  Objective:  Vital signs in last 24 hours: Temp:  [98.7 F (37.1 C)-100 F (37.8 C)] 98.7 F (37.1 C) (04/24 0800) Pulse Rate:  [89-105] 103 (04/24 0542) Resp:  [15-18] 18 (04/24 0800) BP: (117-132)/(77-83) 132/77 mmHg (04/24 0542) SpO2:  [93 %-95 %] 95 % (04/24 0800) Last BM Date: 07/01/12 General:   Alert,  Well-developed,older WM    in NAD Heart:  Regular rate and rhythm; no murmurs Pulm;decreased BS bilateral bases Abdomen:  Soft, tender epigastrium  and nondistended. Normal bowel sounds, without guarding, and without rebound.   Extremities:  Without edema. Neurologic:  Alert and  oriented x4;  grossly normal neurologically. Psych:  Alert and cooperative. Normal mood and affect.  Intake/Output from previous day: 04/23 0701 - 04/24 0700 In: 1424.2 [P.O.:360; I.V.:1064.2] Out: 550 [Urine:550] Intake/Output this shift:    Lab Results:  Recent Labs  06/29/12 0450 06/30/12 0510 07/01/12 0528  WBC 14.1* 19.7* 22.9*  HGB 10.1* 10.5* 10.1*  HCT 30.1* 30.0* 29.8*  PLT 192 227 259   BMET  Recent Labs  06/29/12 0450 06/30/12 0510 07/01/12 0528  NA 134* 136 138  K 3.1* 2.9* 3.1*  CL 101 105 105  CO2 23 22 24   GLUCOSE 124* 130* 103*  BUN 18 21 17   CREATININE 0.60 0.56 0.59  CALCIUM 7.6* 7.5* 7.6*   LFT  Recent Labs  07/01/12 0528  PROT 5.0*  ALBUMIN 1.9*  AST 53*  ALT 43  ALKPHOS 76  BILITOT 0.9   PT/INR No results found for this basename: LABPROT, INR,  in the last 72 hours Hepatitis Panel No results found for this basename: HEPBSAG, HCVAB, HEPAIGM, HEPBIGM,  in the last 72 hours  Assessment / Plan: #1  62 yo  male with bacteremia/polymicrobial and acute pancreatitis with probable necrosis. He is stable and clinically not toxic though WBC  on the rise On Imipenum  Day # 5  Continue full liquids Will ask surgery to see and follow as may need eventual drainage/debridement- Ct was done 4/20 will repeat tomorrow Pt is on Plavix- would hold so procedures can be done if needed   LOS: 8 days   Amy Esterwood  07/01/2012, 9:10 AM  I have personally taken an interval history, reviewed the chart, and examined the patient. Increasing WBC, low grade tempt is worrisome for pancreatic necrosis, possible infection.  Agree with asking surgery to evaluate for drainage.  Barbette Hair. Arlyce Dice, MD, [facility]  Gastroenterology [phone]

## 2012-07-01 NOTE — Progress Notes (Signed)
CSW spoke with [facility]. If patient is unable to ambulate at all and cant bear weight, they would not be able to accept back.  Nyhla Mountjoy C. Steven [name] MSW, LCSW [phone]

## 2012-07-02 DIAGNOSIS — K859 Acute pancreatitis without necrosis or infection, unspecified: Secondary | ICD-10-CM | POA: Clinically undetermined

## 2012-07-02 LAB — CBC
HCT: 28.8 % — ABNORMAL LOW (ref 39.0–52.0)
MCH: 28 pg (ref 26.0–34.0)
MCV: 83.2 fL (ref 78.0–100.0)
Platelets: 293 10*3/uL (ref 150–400)
RBC: 3.46 MIL/uL — ABNORMAL LOW (ref 4.22–5.81)
WBC: 22.5 10*3/uL — ABNORMAL HIGH (ref 4.0–10.5)

## 2012-07-02 LAB — BASIC METABOLIC PANEL
BUN: 16 mg/dL (ref 6–23)
CO2: 26 mEq/L (ref 19–32)
Calcium: 7.5 mg/dL — ABNORMAL LOW (ref 8.4–10.5)
Chloride: 101 mEq/L (ref 96–112)
Creatinine, Ser: 0.63 mg/dL (ref 0.50–1.35)

## 2012-07-02 MED ORDER — POTASSIUM CHLORIDE CRYS ER 20 MEQ PO TBCR
30.0000 meq | EXTENDED_RELEASE_TABLET | Freq: Once | ORAL | Status: AC
  Administered 2012-07-02: 30 meq via ORAL
  Filled 2012-07-02: qty 1

## 2012-07-02 NOTE — Progress Notes (Signed)
Physical Therapy Treatment Patient Details Name: Steven [name] MRN: [id number] DOB: 04/19/1950 Today's Date: 07/02/2012 Time: 4098-1191 PT Time Calculation (min): 23 min  PT Assessment / Plan / Recommendation Comments on Treatment Session  POD 6 ORIF 2nd fall/fx as well as s/p CVA w/ L hemiparesis and chronic L Humeral fx. Assisted pt OOB via squat pivot total assist pt 20% to recliner.    Follow Up Recommendations  SNF     Equipment Recommendations  None recommended by PT    Frequency Min 3X/week   Plan Discharge plan remains appropriate    Precautions / Restrictions Precautions Precautions: Fall Precaution Comments: L hemiparesis s/p CVA and L chronic humerus fx Restrictions Weight Bearing Restrictions: Yes LLE Weight Bearing: Partial weight bearing LLE Partial Weight Bearing Percentage or Pounds: 50%   Pertinent Vitals/Pain Pt unable to rate pain    Mobility  Bed Mobility Bed Mobility: Supine to Sit Supine to Sit: 1: +2 Total assist Supine to Sit: Patient Percentage: 40% Details for Bed Mobility Assistance: Pt voluntarily grasped rail to assist self to upright position. Transfers Transfers: Stand Pivot Transfers Sit to Stand: 1: +1 Total assist;From bed Stand to Sit: 1: +1 Total assist;To chair/3-in-1 Stand Pivot Transfers: 1: +1 Total assist Details for Transfer Assistance: Using "Bear Hug" standd pivot to pt's R from bed to recliner. Total assist+1 pt 40% Ambulation/Gait Ambulation/Gait Assistance: Not tested (comment)          PT Goals Acute Rehab PT Goals PT Goal Formulation: Patient unable to participate in goal setting Time For Goal Achievement: 07/02/12 Potential to Achieve Goals: Fair Pt will go Supine/Side to Sit: with mod assist PT Goal: Supine/Side to Sit - Progress: Progressing toward goal Pt will Sit at Edge of Bed: with supervision;3-5 min;with no upper extremity support PT Goal: Sit at Edge Of Bed - Progress: Progressing toward goal Pt will  go Sit to Stand: with mod assist PT Goal: Sit to Stand - Progress: Progressing toward goal Pt will go Stand to Sit: with mod assist PT Goal: Stand to Sit - Progress: Progressing toward goal Pt will Transfer Bed to Chair/Chair to Bed: with mod assist PT Transfer Goal: Bed to Chair/Chair to Bed - Progress: Progressing toward goal  Visit Information  Last PT Received On: 07/02/12 Assistance Needed: +2    Subjective Data   "You're breakin' my leg."   Cognition    Fair-   Balance   Fair-  End of Session PT - End of Session Equipment Utilized During Treatment: Gait belt Activity Tolerance: Patient tolerated treatment well Patient left: in chair;with call bell/phone within reach   GP     BROWN-SMEDLEY, NICOLE 07/02/2012, 3:17 PM  [name]  PTA [facility]  Acute  Rehab Pager      [phone]

## 2012-07-02 NOTE — Progress Notes (Signed)
He continues to look well despite increasing WBC.  Afebrile.  On PE he has mild periumbilical tenderness.   WBC 22.5 (stable). Would continue antibiotics for now.  Repeat CT early next week provided pt remains stable, WBC does not increase.

## 2012-07-02 NOTE — Progress Notes (Signed)
ANTIBIOTIC CONSULT NOTE - follow up  Pharmacy Consult for Primaxin  Indication: Bacteremia and peri-pancreatic abscess  Allergies  Allergen Reactions  . Cefazolin Rash  . Penicillins     REACTION: unknown allergy - hx since childhood    Patient Measurements: Height: 5' 1.81" (157 cm) Weight: 142 lb (64.411 kg) IBW/kg (Calculated) : 54.17   Vital Signs: Temp: 99.9 F (37.7 C) (04/25 0640) Temp src: Oral (04/25 0640) BP: 120/68 mmHg (04/25 0640) Pulse Rate: 98 (04/25 0640) Intake/Output from previous day: 04/24 0701 - 04/25 0700 In: 1550 [I.V.:1350; IV Piggyback:200] Out: 1150 [Urine:1150]  Labs:  Recent Labs  06/30/12 0510 07/01/12 0528 07/02/12 0530  WBC 19.7* 22.9* 22.5*  HGB 10.5* 10.1* 9.7*  PLT 227 259 293  CREATININE 0.56 0.59 0.63   Estimated Creatinine Clearance: 74.3 ml/min (by C-G formula based on Cr of 0.63).  Microbiology: Recent Results (from the past 720 hour(s))  CULTURE, BLOOD (ROUTINE X 2)     Status: None   Collection Time    06/24/12  4:05 AM      Result Value Range Status   Specimen Description BLOOD LEFT ARM   Final   Special Requests BOTTLES DRAWN AEROBIC AND ANAEROBIC 4CC   Final   Culture  Setup Time 06/24/2012 08:30   Final   Culture     Final   Value: GRAM NEGATIVE RODS     ENTEROCOCCUS SPECIES     Note: Gram Stain Report Called to,Read Back By and Verified With: MONIQUE HOWLETT ON 06/24/2012 AT 11:16P BY WILEJ   Report Status 06/27/2012 FINAL   Final  CULTURE, BLOOD (ROUTINE X 2)     Status: None   Collection Time    06/24/12  4:05 AM      Result Value Range Status   Specimen Description BLOOD RIGHT ANTECUBITAL   Final   Special Requests BOTTLES DRAWN AEROBIC AND ANAEROBIC 5CC   Final   Culture  Setup Time 06/24/2012 08:30   Final   Culture     Final   Value: ENTEROCOCCUS SPECIES     Note: COMBINATION THERAPY OF HIGH DOSE AMPICILLIN OR VANCOMYCIN, PLUS AN AMINOGLYCOSIDE, IS USUALLY INDICATED FOR SERIOUS ENTEROCOCCAL  INFECTIONS.     KLEBSIELLA PNEUMONIAE     Note: Gram Stain Report Called to,Read Back By and Verified With: MONIQUE HOWLETT ON 06/24/2012 AT 11:16P   Report Status 06/27/2012 FINAL   Final   Organism ID, Bacteria ENTEROCOCCUS SPECIES   Final   Organism ID, Bacteria KLEBSIELLA PNEUMONIAE   Final  MRSA PCR SCREENING     Status: None   Collection Time    06/24/12  4:52 AM      Result Value Range Status   MRSA by PCR NEGATIVE  NEGATIVE Final   Comment:            The GeneXpert MRSA Assay (FDA     approved for NASAL specimens     only), is one component of a     comprehensive MRSA colonization     surveillance program. It is not     intended to diagnose MRSA     infection nor to guide or     monitor treatment for     MRSA infections.  CULTURE, BLOOD (ROUTINE X 2)     Status: None   Collection Time    06/27/12  1:01 PM      Result Value Range Status   Specimen Description BLOOD LEFT ARM   Final  Special Requests BOTTLES DRAWN AEROBIC ONLY 1CC   Final   Culture  Setup Time 06/27/2012 17:11   Final   Culture     Final   Value:        BLOOD CULTURE RECEIVED NO GROWTH TO DATE CULTURE WILL BE HELD FOR 5 DAYS BEFORE ISSUING A FINAL NEGATIVE REPORT   Report Status PENDING   Incomplete  CULTURE, BLOOD (ROUTINE X 2)     Status: None   Collection Time    06/27/12  1:21 PM      Result Value Range Status   Specimen Description BLOOD LEFT HAND   Final   Special Requests BOTTLES DRAWN AEROBIC ONLY 1CC   Final   Culture  Setup Time 06/27/2012 17:10   Final   Culture     Final   Value:        BLOOD CULTURE RECEIVED NO GROWTH TO DATE CULTURE WILL BE HELD FOR 5 DAYS BEFORE ISSUING A FINAL NEGATIVE REPORT   Report Status PENDING   Incomplete  URINE CULTURE     Status: None   Collection Time    06/29/12  6:20 AM      Result Value Range Status   Specimen Description URINE, RANDOM   Final   Special Requests Immunocompromised   Final   Culture  Setup Time 06/29/2012 08:38   Final   Colony Count NO  GROWTH   Final   Culture NO GROWTH   Final   Report Status 06/30/2012 FINAL   Final  CLOSTRIDIUM DIFFICILE BY PCR     Status: None   Collection Time    06/29/12  4:45 PM      Result Value Range Status   C difficile by pcr NEGATIVE  NEGATIVE Final   Medications:  Anti-infectives   Start     Dose/Rate Route Frequency Ordered Stop   06/28/12 1630  imipenem-cilastatin (PRIMAXIN) 500 mg in sodium chloride 0.9 % 100 mL IVPB     500 mg 200 mL/hr over 30 Minutes Intravenous Every 8 hours 06/28/12 1617     06/28/12 0800  imipenem-cilastatin (PRIMAXIN) 500 mg in sodium chloride 0.9 % 100 mL IVPB  Status:  Discontinued     500 mg 200 mL/hr over 30 Minutes Intravenous Every 8 hours 06/28/12 0718 06/28/12 1030   06/27/12 1400  metroNIDAZOLE (FLAGYL) tablet 500 mg  Status:  Discontinued     500 mg Oral 3 times per day 06/27/12 1032 06/27/12 1229   06/26/12 2000  vancomycin (VANCOCIN) IVPB 1000 mg/200 mL premix  Status:  Discontinued     1,000 mg 200 mL/hr over 60 Minutes Intravenous Every 8 hours 06/26/12 1200 06/27/12 1229   06/26/12 1215  vancomycin (VANCOCIN) IVPB 1000 mg/200 mL premix     1,000 mg 200 mL/hr over 60 Minutes Intravenous  Once 06/26/12 1203 06/26/12 1315   06/25/12 0800  levofloxacin (LEVAQUIN) IVPB 750 mg  Status:  Discontinued     750 mg 100 mL/hr over 90 Minutes Intravenous Every 24 hours 06/24/12 1014 06/27/12 1039   06/25/12 0700  clindamycin (CLEOCIN) IVPB 900 mg  Status:  Discontinued     900 mg 100 mL/hr over 30 Minutes Intravenous  Once 06/25/12 0912 06/27/12 1205   06/24/12 2200  vancomycin (VANCOCIN) IVPB 750 mg/150 ml premix  Status:  Discontinued     750 mg 150 mL/hr over 60 Minutes Intravenous Every 12 hours 06/24/12 1014 06/26/12 1200   06/24/12 1400  aztreonam (AZACTAM) 1 g in  dextrose 5 % 50 mL IVPB  Status:  Discontinued     1 g 100 mL/hr over 30 Minutes Intravenous 3 times per day 06/24/12 1014 06/27/12 1229   06/24/12 0415  levofloxacin (LEVAQUIN) IVPB  750 mg     750 mg 100 mL/hr over 90 Minutes Intravenous  Once 06/24/12 0400 06/24/12 0557   06/24/12 0415  vancomycin (VANCOCIN) IVPB 1000 mg/200 mL premix     1,000 mg 200 mL/hr over 60 Minutes Intravenous  Once 06/24/12 0400 06/24/12 0654   06/24/12 0415  aztreonam (AZACTAM) 2 g in dextrose 5 % 50 mL IVPB     2 g 100 mL/hr over 30 Minutes Intravenous  Once 06/24/12 0409 06/24/12 1610      Assessment:  62 yo M with enterococcus/klebsiella bacteremia from 4/17 blood cultures.  Patient was on Vanco/Levaquin/Aztreonam from 4/17-4/20.  Cultures sensitive to ampicillin, however patient has a PCN and Ancef allergy listed.  Per ID, changed to Primaxin on 4/20 but was started on 4/21  Day #5 Primaxin  WBC (22.5) remain elevated.    SCr is stable, CrCl ~ 74 ml/min.  Repeat blood cultures 4/20 remain NGTD  Goal of Therapy:  Primaxin per renal function   Plan:   Continue Primaxin 500 mg IV q8h  Follow up renal function and cultures as available.  Lynann Beaver PharmD, BCPS Pager [phone] 07/02/2012 1:55 PM

## 2012-07-02 NOTE — Progress Notes (Signed)
Patient ID: Steven [name], male   DOB: [birth year], 62 y.o.   MRN: [id number]         [facility]    Date of Admission:  06/23/2012   Total days of antibiotics 10        Day 5 imipenem         Principal Problem:   Closed left hip fracture Active Problems:   CVA   Dementia   Fracture of left humerus   Leucocytosis   Bacteremia due to Enterococcus   Bacteremia due to Klebsiella pneumoniae   Abnormal finding on GI tract imaging   Pancreatitis, acute   . clopidogrel  75 mg Oral Q breakfast  . enoxaparin (LOVENOX) injection  40 mg Subcutaneous Q24H  . feeding supplement  237 mL Oral TID WC  . ferrous sulfate  325 mg Oral TID PC  . imipenem-cilastatin  500 mg Intravenous Q8H  . metoprolol tartrate  50 mg Oral BID  . pantoprazole  40 mg Oral Daily  . potassium chloride  40 mEq Oral Once  . tamsulosin  0.4 mg Oral QHS    Subjective: Says yes to pain.  Objective: Temp:  [98.8 F (37.1 C)-99.9 F (37.7 C)] 99.9 F (37.7 C) (04/25 0640) Pulse Rate:  [98-109] 98 (04/25 0640) Resp:  [18-20] 18 (04/25 0640) BP: (105-129)/(68-72) 120/68 mmHg (04/25 0640) SpO2:  [93 %-100 %] 100 % (04/25 0640)  General: He appears comfortable Skin: IV site looks good Lungs: Clear Cor: Regular S1 and S2 no murmurs Abdomen: Appears to have upper quadrant tenderness  Lab Results Lab Results  Component Value Date   WBC 22.5* 07/02/2012   HGB 9.7* 07/02/2012   HCT 28.8* 07/02/2012   MCV 83.2 07/02/2012   PLT 293 07/02/2012    Lab Results  Component Value Date   CREATININE 0.63 07/02/2012   BUN 16 07/02/2012   NA 135 07/02/2012   K 3.2* 07/02/2012   CL 101 07/02/2012   CO2 26 07/02/2012    Lab Results  Component Value Date   ALT 43 07/01/2012   AST 53* 07/01/2012   ALKPHOS 76 07/01/2012   BILITOT 0.9 07/01/2012      Microbiology: Recent Results (from the past 240 hour(s))  CULTURE, BLOOD (ROUTINE X 2)     Status: None   Collection Time    06/24/12  4:05 AM   Result Value Range Status   Specimen Description BLOOD LEFT ARM   Final   Special Requests BOTTLES DRAWN AEROBIC AND ANAEROBIC 4CC   Final   Culture  Setup Time 06/24/2012 08:30   Final   Culture     Final   Value: GRAM NEGATIVE RODS     ENTEROCOCCUS SPECIES     Note: Gram Stain Report Called to,Read Back By and Verified With: MONIQUE HOWLETT ON 06/24/2012 AT 11:16P BY WILEJ   Report Status 06/27/2012 FINAL   Final  CULTURE, BLOOD (ROUTINE X 2)     Status: None   Collection Time    06/24/12  4:05 AM      Result Value Range Status   Specimen Description BLOOD RIGHT ANTECUBITAL   Final   Special Requests BOTTLES DRAWN AEROBIC AND ANAEROBIC 5CC   Final   Culture  Setup Time 06/24/2012 08:30   Final   Culture     Final   Value: ENTEROCOCCUS SPECIES     Note: COMBINATION THERAPY OF HIGH DOSE AMPICILLIN OR VANCOMYCIN, PLUS AN AMINOGLYCOSIDE, IS USUALLY  INDICATED FOR SERIOUS ENTEROCOCCAL INFECTIONS.     KLEBSIELLA PNEUMONIAE     Note: Gram Stain Report Called to,Read Back By and Verified With: MONIQUE HOWLETT ON 06/24/2012 AT 11:16P   Report Status 06/27/2012 FINAL   Final   Organism ID, Bacteria ENTEROCOCCUS SPECIES   Final   Organism ID, Bacteria KLEBSIELLA PNEUMONIAE   Final  MRSA PCR SCREENING     Status: None   Collection Time    06/24/12  4:52 AM      Result Value Range Status   MRSA by PCR NEGATIVE  NEGATIVE Final   Comment:            The GeneXpert MRSA Assay (FDA     approved for NASAL specimens     only), is one component of a     comprehensive MRSA colonization     surveillance program. It is not     intended to diagnose MRSA     infection nor to guide or     monitor treatment for     MRSA infections.  CULTURE, BLOOD (ROUTINE X 2)     Status: None   Collection Time    06/27/12  1:01 PM      Result Value Range Status   Specimen Description BLOOD LEFT ARM   Final   Special Requests BOTTLES DRAWN AEROBIC ONLY 1CC   Final   Culture  Setup Time 06/27/2012 17:11   Final    Culture     Final   Value:        BLOOD CULTURE RECEIVED NO GROWTH TO DATE CULTURE WILL BE HELD FOR 5 DAYS BEFORE ISSUING A FINAL NEGATIVE REPORT   Report Status PENDING   Incomplete  CULTURE, BLOOD (ROUTINE X 2)     Status: None   Collection Time    06/27/12  1:21 PM      Result Value Range Status   Specimen Description BLOOD LEFT HAND   Final   Special Requests BOTTLES DRAWN AEROBIC ONLY 1CC   Final   Culture  Setup Time 06/27/2012 17:10   Final   Culture     Final   Value:        BLOOD CULTURE RECEIVED NO GROWTH TO DATE CULTURE WILL BE HELD FOR 5 DAYS BEFORE ISSUING A FINAL NEGATIVE REPORT   Report Status PENDING   Incomplete  URINE CULTURE     Status: None   Collection Time    06/29/12  6:20 AM      Result Value Range Status   Specimen Description URINE, RANDOM   Final   Special Requests Immunocompromised   Final   Culture  Setup Time 06/29/2012 08:38   Final   Colony Count NO GROWTH   Final   Culture NO GROWTH   Final   Report Status 06/30/2012 FINAL   Final  CLOSTRIDIUM DIFFICILE BY PCR     Status: None   Collection Time    06/29/12  4:45 PM      Result Value Range Status   C difficile by pcr NEGATIVE  NEGATIVE Final   Assessment: And he has stable leukocytosis but otherwise appears to be responding to therapy for pancreatic infection and polymicrobial bacteremia. He has allergies listed to cephalosporins and penicillin so I will continue imipenem.  Plan: 1. Continue imipenem 2. Please call Dr. Merceda Elks [phone]) for any infectious disease questions this weekend  Cliffton Asters, MD [facility] [phone] pager  [phone] cell 07/02/2012, 11:00 AM

## 2012-07-02 NOTE — Progress Notes (Signed)
WBC stabilizing.  Needs to be NPO if he is having pain. Not ill appearing and would continue ABX for now.  Can FNA area with IR to determine necrosis from active infection.

## 2012-07-02 NOTE — Progress Notes (Signed)
Patient ID: Steven [name], male   DOB: 28-Nov-1950, 62 y.o.   MRN: [id number] 7 Days Post-Op  Subjective: Pt c/o left sided abd pain, also relays nausea, denies vomiting  Objective: Vital signs in last 24 hours: Temp:  [98.7 F (37.1 C)-99.9 F (37.7 C)] 99.9 F (37.7 C) (04/25 0640) Pulse Rate:  [98-109] 98 (04/25 0640) Resp:  [18-20] 18 (04/25 0640) BP: (105-129)/(68-72) 120/68 mmHg (04/25 0640) SpO2:  [93 %-100 %] 100 % (04/25 0640) Last BM Date: 07/01/12  Intake/Output from previous day: 04/24 0701 - 04/25 0700 In: 1550 [I.V.:1350; IV Piggyback:200] Out: 1150 [Urine:1150] Intake/Output this shift:    PE: Abd: mildly distended, +bs, is a little tender in LUQ but not severe General: sleeping when I came in room, awakes to name and appears in NAD  Lab Results:   Recent Labs  07/01/12 0528 07/02/12 0530  WBC 22.9* 22.5*  HGB 10.1* 9.7*  HCT 29.8* 28.8*  PLT 259 293   BMET  Recent Labs  07/01/12 0528 07/02/12 0530  NA 138 135  K 3.1* 3.2*  CL 105 101  CO2 24 26  GLUCOSE 103* 106*  BUN 17 16  CREATININE 0.59 0.63  CALCIUM 7.6* 7.5*   PT/INR No results found for this basename: LABPROT, INR,  in the last 72 hours CMP     Component Value Date/Time   NA 135 07/02/2012 0530   K 3.2* 07/02/2012 0530   CL 101 07/02/2012 0530   CO2 26 07/02/2012 0530   GLUCOSE 106* 07/02/2012 0530   BUN 16 07/02/2012 0530   CREATININE 0.63 07/02/2012 0530   CALCIUM 7.5* 07/02/2012 0530   PROT 5.0* 07/01/2012 0528   ALBUMIN 1.9* 07/01/2012 0528   AST 53* 07/01/2012 0528   ALT 43 07/01/2012 0528   ALKPHOS 76 07/01/2012 0528   BILITOT 0.9 07/01/2012 0528   GFRNONAA >90 07/02/2012 0530   GFRAA >90 07/02/2012 0530   Lipase     Component Value Date/Time   LIPASE 22 06/28/2012 1740       Studies/Results: Dg Ugi W/water Sol Cm  06/30/2012  *RADIOLOGY REPORT*  Clinical Data:Pancreatitis.  Peripancreatic abscess.  Duodenal edema.  ESOPHAGUS/BARIUM SWALLOW/TABLET STUDY  Fluoroscopy  Time: 4 minutes 14 seconds slow pulsed fluoroscopy  Comparison: CT scan dated 06/27/2012  Findings: Scout image demonstrates slight dilatation of the single small bowel loop in the mid abdomen.  This is suggestive of a localized ileus.  The patient ingested water-soluble contrast.  The contrast passed into the normal appearing pylorus and duodenal bulb.  There is marked edema of the mucosa of the second and third portions of the duodenum.  However, contrast did pass through that area.  There is no visible duodenal ulcer.  No extravasation of contrast.  IMPRESSION:   Marked edema of the mucosa of the second and third portions of the duodenum without evidence of a duodenal ulcer.  The edema is most likely secondary to the adjacent pancreatitis.   Original Report Authenticated By: Francene Boyers, M.D.     Anti-infectives: Anti-infectives   Start     Dose/Rate Route Frequency Ordered Stop   06/28/12 1630  imipenem-cilastatin (PRIMAXIN) 500 mg in sodium chloride 0.9 % 100 mL IVPB     500 mg 200 mL/hr over 30 Minutes Intravenous Every 8 hours 06/28/12 1617     06/28/12 0800  imipenem-cilastatin (PRIMAXIN) 500 mg in sodium chloride 0.9 % 100 mL IVPB  Status:  Discontinued     500 mg 200  mL/hr over 30 Minutes Intravenous Every 8 hours 06/28/12 0718 06/28/12 1030   06/27/12 1400  metroNIDAZOLE (FLAGYL) tablet 500 mg  Status:  Discontinued     500 mg Oral 3 times per day 06/27/12 1032 06/27/12 1229   06/26/12 2000  vancomycin (VANCOCIN) IVPB 1000 mg/200 mL premix  Status:  Discontinued     1,000 mg 200 mL/hr over 60 Minutes Intravenous Every 8 hours 06/26/12 1200 06/27/12 1229   06/26/12 1215  vancomycin (VANCOCIN) IVPB 1000 mg/200 mL premix     1,000 mg 200 mL/hr over 60 Minutes Intravenous  Once 06/26/12 1203 06/26/12 1315   06/25/12 0800  levofloxacin (LEVAQUIN) IVPB 750 mg  Status:  Discontinued     750 mg 100 mL/hr over 90 Minutes Intravenous Every 24 hours 06/24/12 1014 06/27/12 1039   06/25/12  0700  clindamycin (CLEOCIN) IVPB 900 mg  Status:  Discontinued     900 mg 100 mL/hr over 30 Minutes Intravenous  Once 06/25/12 0912 06/27/12 1205   06/24/12 2200  vancomycin (VANCOCIN) IVPB 750 mg/150 ml premix  Status:  Discontinued     750 mg 150 mL/hr over 60 Minutes Intravenous Every 12 hours 06/24/12 1014 06/26/12 1200   06/24/12 1400  aztreonam (AZACTAM) 1 g in dextrose 5 % 50 mL IVPB  Status:  Discontinued     1 g 100 mL/hr over 30 Minutes Intravenous 3 times per day 06/24/12 1014 06/27/12 1229   06/24/12 0415  levofloxacin (LEVAQUIN) IVPB 750 mg     750 mg 100 mL/hr over 90 Minutes Intravenous  Once 06/24/12 0400 06/24/12 0557   06/24/12 0415  vancomycin (VANCOCIN) IVPB 1000 mg/200 mL premix     1,000 mg 200 mL/hr over 60 Minutes Intravenous  Once 06/24/12 0400 06/24/12 0654   06/24/12 0415  aztreonam (AZACTAM) 2 g in dextrose 5 % 50 mL IVPB     2 g 100 mL/hr over 30 Minutes Intravenous  Once 06/24/12 0409 06/24/12 0624       Assessment/Plan 1. Pancreatitis with rising WBC on a dysphasia 1 diet.  2. Positive bacteremia of uncertain etiology. Been followed by ID.  3. 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  4. Significant dementia; currently resides at a skilled nursing facility.  5. Prior history of alcohol and substance abuse.  6. Follow with left hip ORIF 4/17 Dr. Charlann Boxer  7. Urinary incontinence/hypertonicity of the bladder.  8. Prior fracture of the left humerus, with history of multiple trauma/crush fracture.  9. Lumbar spondylitis   Plan: WBC remains 22, now c/o abd pain and nausea, ?now symptomatic from pancreatitis, will make NPO for right now until Dr. Luisa Hart evaluates, may need FNA of area, will follow up later today    LOS: 9 days    Kennan Detter 07/02/2012

## 2012-07-02 NOTE — Consult Note (Signed)
Seen examined and agree.  If WBC continues to go up may need FNA of area in pancreas to determine if necrotic or infected.  No acute surgical need at this point.

## 2012-07-02 NOTE — Progress Notes (Signed)
[facility] PROGRESS NOTE  Steven [name] VWU:[id number] DOB: 20-Mar-1950 DOA: 06/23/2012 PCP: No primary provider on file.  Assessment/Plan:                                                                                                                1. Necrotizing pancreatitis - Unclear etiology. Lipase levels within normal limits. LFT'S within normal limits. GI consulted from [place]. May be with peripancreatic abscess. - still has low-grade temperature this morning - Leukocytosis persistent, albeit mildly improved. - surgery following for potential debridement, no surgical indication yet 2. Left femoral intertrochanteric fracture -  - Underwent ORIF of the left femur on 4/18. PT/OT evaluation , plan for SNF placement. Pain control and anticoagulation on board.  3. Sinus tachycardia - May be pain, related due to dehydration or most likely due to #1.  CT angiogram of the chest to rule out PE was negative. Free t4 and TSH within normal limits. Echocardiogram showed good LVEF without any wall abn. His tachycardia improved and he is b blockers.  4. History of CVA with left-sided hemiparesis - initially plavix held  For surgery of the left hip. Will restart plavix .  5. Dementia - no acute issues. 6. Chronic left humerus fracture.; no acute issues.  7. Polymicrobial bacteremia/Leukocytosis - C diff negative. patient is afebrile. Chest x-ray and UA are unremarkable except the chest x-ray showing mild congestion. He was found to have polymicrobial bacteremia with Positive Blood cultures showing enterococcus and klebsiella. ID consulted for further recommendations. CT abd and pelvis was done and showed necrotizing pancreatitis. Started him on primaxin.   - ID following, appreciate input - continue antibiotics  8. Hypokalemia; replete as needed. Will order a magnesium level .  9. DVT prophylaxis on lovenox.   Code Status: full code Family Communication: none at bedside Disposition Plan: SNF  when medically stable  PROCEDURE: Open reduction and internal fixation of the left comminuted intertrochanteric femur fracture on 4/18  Consultants:  Cardiology for tachycardic  Orthopedics   ID Dr Orvan Falconer.  GI Dr. Arlyce Dice  HPI/Subjective: - no acute complaints but endorses worsening abdominal pain when asked and poor appetite.  Objective: Filed Vitals:   07/01/12 1418 07/01/12 1600 07/01/12 2127 07/02/12 0640  BP: 129/72  105/68 120/68  Pulse: 105  108 98  Temp: 98.8 F (37.1 C)  99 F (37.2 C) 99.9 F (37.7 C)  TempSrc: Oral  Oral Oral  Resp: 20 18 20 18   Height:      Weight:      SpO2: 95% 96% 95% 100%    Intake/Output Summary (Last 24 hours) at 07/02/12 1246 Last data filed at 07/02/12 0946  Gross per 24 hour  Intake   1550 ml  Output   1150 ml  Net    400 ml   Filed Weights   06/24/12 0837  Weight: 64.411 kg (142 lb)    Exam:  General: Well-developed well-nourished.  Cardiovascular: S1-S2 heard tachycardic.  Respiratory: No rhonchi or crepitations.  Abdomen: Soft  with mild tenderness bowel sounds present.   Musculoskeletal: Pain on moving left shoulder and left leg.  Neurologic: Has left-sided weakness from old stroke  Data Reviewed: Basic Metabolic Panel:  Recent Labs Lab 06/26/12 0533 06/27/12 0520 06/28/12 0855 06/28/12 1740 06/28/12 1913 06/29/12 0450 06/30/12 0510 07/01/12 0528 07/02/12 0530  NA 131* 132* 133* 134*  --  134* 136 138 135  K 3.2* 2.9* 2.9* 2.6* 2.7* 3.1* 2.9* 3.1* 3.2*  CL 98 98 102 100  --  101 105 105 101  CO2 26 26 25 24   --  23 22 24 26   GLUCOSE 125* 138* 108* 130*  --  124* 130* 103* 106*  BUN 22 17 16 18   --  18 21 17 16   CREATININE 0.67 0.63 0.62 0.62  --  0.60 0.56 0.59 0.63  CALCIUM 7.7* 7.8* 7.5* 7.5*  --  7.6* 7.5* 7.6* 7.5*  MG  --  2.1 2.3  --   --  2.4  --   --   --    Liver Function Tests:  Recent Labs Lab 06/28/12 1740 06/30/12 0510 07/01/12 0528  AST 34 28 53*  ALT 41 33 43  ALKPHOS 69 70  76  BILITOT 0.6 0.8 0.9  PROT 5.2* 5.0* 5.0*  ALBUMIN 1.9* 1.9* 1.9*    Recent Labs Lab 06/28/12 1740  LIPASE 22  AMYLASE 53   CBC:  Recent Labs Lab 06/27/12 0520 06/29/12 0450 06/30/12 0510 07/01/12 0528 07/02/12 0530  WBC 13.1* 14.1* 19.7* 22.9* 22.5*  HGB 10.3* 10.1* 10.5* 10.1* 9.7*  HCT 30.2* 30.1* 30.0* 29.8* 28.8*  MCV 83.4 82.5 82.2 83.7 83.2  PLT 141* 192 227 259 293   Cardiac Enzymes: No results found for this basename: CKTOTAL, CKMB, CKMBINDEX, TROPONINI,  in the last 168 hours BNP (last 3 results)  Recent Labs  06/24/12 0405 06/24/12 0551  PROBNP 1874.0* 1830.0*   Recent Results (from the past 240 hour(s))  CULTURE, BLOOD (ROUTINE X 2)     Status: None   Collection Time    06/24/12  4:05 AM      Result Value Range Status   Specimen Description BLOOD LEFT ARM   Final   Special Requests BOTTLES DRAWN AEROBIC AND ANAEROBIC 4CC   Final   Culture  Setup Time 06/24/2012 08:30   Final   Culture     Final   Value: GRAM NEGATIVE RODS     ENTEROCOCCUS SPECIES     Note: Gram Stain Report Called to,Read Back By and Verified With: MONIQUE HOWLETT ON 06/24/2012 AT 11:16P BY WILEJ   Report Status 06/27/2012 FINAL   Final  CULTURE, BLOOD (ROUTINE X 2)     Status: None   Collection Time    06/24/12  4:05 AM      Result Value Range Status   Specimen Description BLOOD RIGHT ANTECUBITAL   Final   Special Requests BOTTLES DRAWN AEROBIC AND ANAEROBIC 5CC   Final   Culture  Setup Time 06/24/2012 08:30   Final   Culture     Final   Value: ENTEROCOCCUS SPECIES     Note: COMBINATION THERAPY OF HIGH DOSE AMPICILLIN OR VANCOMYCIN, PLUS AN AMINOGLYCOSIDE, IS USUALLY INDICATED FOR SERIOUS ENTEROCOCCAL INFECTIONS.     KLEBSIELLA PNEUMONIAE     Note: Gram Stain Report Called to,Read Back By and Verified With: MONIQUE HOWLETT ON 06/24/2012 AT 11:16P   Report Status 06/27/2012 FINAL   Final   Organism ID, Bacteria ENTEROCOCCUS SPECIES  Final   Organism ID, Bacteria KLEBSIELLA  PNEUMONIAE   Final  MRSA PCR SCREENING     Status: None   Collection Time    06/24/12  4:52 AM      Result Value Range Status   MRSA by PCR NEGATIVE  NEGATIVE Final   Comment:            The GeneXpert MRSA Assay (FDA     approved for NASAL specimens     only), is one component of a     comprehensive MRSA colonization     surveillance program. It is not     intended to diagnose MRSA     infection nor to guide or     monitor treatment for     MRSA infections.  CULTURE, BLOOD (ROUTINE X 2)     Status: None   Collection Time    06/27/12  1:01 PM      Result Value Range Status   Specimen Description BLOOD LEFT ARM   Final   Special Requests BOTTLES DRAWN AEROBIC ONLY 1CC   Final   Culture  Setup Time 06/27/2012 17:11   Final   Culture     Final   Value:        BLOOD CULTURE RECEIVED NO GROWTH TO DATE CULTURE WILL BE HELD FOR 5 DAYS BEFORE ISSUING A FINAL NEGATIVE REPORT   Report Status PENDING   Incomplete  CULTURE, BLOOD (ROUTINE X 2)     Status: None   Collection Time    06/27/12  1:21 PM      Result Value Range Status   Specimen Description BLOOD LEFT HAND   Final   Special Requests BOTTLES DRAWN AEROBIC ONLY 1CC   Final   Culture  Setup Time 06/27/2012 17:10   Final   Culture     Final   Value:        BLOOD CULTURE RECEIVED NO GROWTH TO DATE CULTURE WILL BE HELD FOR 5 DAYS BEFORE ISSUING A FINAL NEGATIVE REPORT   Report Status PENDING   Incomplete  URINE CULTURE     Status: None   Collection Time    06/29/12  6:20 AM      Result Value Range Status   Specimen Description URINE, RANDOM   Final   Special Requests Immunocompromised   Final   Culture  Setup Time 06/29/2012 08:38   Final   Colony Count NO GROWTH   Final   Culture NO GROWTH   Final   Report Status 06/30/2012 FINAL   Final  CLOSTRIDIUM DIFFICILE BY PCR     Status: None   Collection Time    06/29/12  4:45 PM      Result Value Range Status   C difficile by pcr NEGATIVE  NEGATIVE Final     Studies: No  results found.  Scheduled Meds: . clopidogrel  75 mg Oral Q breakfast  . enoxaparin (LOVENOX) injection  40 mg Subcutaneous Q24H  . feeding supplement  237 mL Oral TID WC  . ferrous sulfate  325 mg Oral TID PC  . imipenem-cilastatin  500 mg Intravenous Q8H  . metoprolol tartrate  50 mg Oral BID  . pantoprazole  40 mg Oral Daily  . potassium chloride  40 mEq Oral Once  . tamsulosin  0.4 mg Oral QHS   Continuous Infusions: . lactated ringers 50 mL/hr at 07/02/12 1032    Principal Problem:   Closed left hip fracture Active Problems:   CVA  Dementia   Fracture of left humerus   Leucocytosis   Bacteremia due to Enterococcus   Bacteremia due to Klebsiella pneumoniae   Abnormal finding on GI tract imaging   Pancreatitis, acute   Time spent: 25 min  Pamella Pert  [facility] Pager [phone] If 7PM-7AM, please contact night-coverage at [email or web address], password [facility] 07/02/2012, 12:46 PM  LOS: 9 days

## 2012-07-03 ENCOUNTER — Encounter (HOSPITAL_COMMUNITY): Payer: Self-pay | Admitting: Surgery

## 2012-07-03 ENCOUNTER — Inpatient Hospital Stay (HOSPITAL_COMMUNITY): Payer: Medicaid Other

## 2012-07-03 LAB — CBC
HCT: 25.5 % — ABNORMAL LOW (ref 39.0–52.0)
MCH: 28.7 pg (ref 26.0–34.0)
MCHC: 34.1 g/dL (ref 30.0–36.0)
MCV: 84.2 fL (ref 78.0–100.0)
Platelets: 297 10*3/uL (ref 150–400)
RDW: 13.9 % (ref 11.5–15.5)

## 2012-07-03 LAB — CULTURE, BLOOD (ROUTINE X 2)

## 2012-07-03 LAB — BASIC METABOLIC PANEL
BUN: 15 mg/dL (ref 6–23)
Calcium: 7.4 mg/dL — ABNORMAL LOW (ref 8.4–10.5)
Creatinine, Ser: 0.53 mg/dL (ref 0.50–1.35)
GFR calc Af Amer: >90 mL/min (ref >90)
GFR calc non Af Amer: >90 mL/min (ref >90)

## 2012-07-03 MED ORDER — ADULT MULTIVITAMIN W/MINERALS CH
1.0000 | ORAL_TABLET | Freq: Every morning | ORAL | Status: DC
  Administered 2012-07-04 – 2012-07-07 (×4): 1 via ORAL
  Filled 2012-07-03 (×7): qty 1

## 2012-07-03 MED ORDER — OXYCODONE HCL 5 MG PO TABS
5.0000 mg | ORAL_TABLET | ORAL | Status: DC | PRN
  Administered 2012-07-05: 10 mg via ORAL
  Administered 2012-07-06: 5 mg via ORAL
  Administered 2012-07-06: 10 mg via ORAL
  Administered 2012-07-06 – 2012-07-15 (×8): 5 mg via ORAL
  Administered 2012-07-15: 10 mg via ORAL
  Filled 2012-07-03: qty 2
  Filled 2012-07-03: qty 1
  Filled 2012-07-03: qty 2
  Filled 2012-07-03 (×8): qty 1
  Filled 2012-07-03: qty 2
  Filled 2012-07-03: qty 1

## 2012-07-03 MED ORDER — SACCHAROMYCES BOULARDII 250 MG PO CAPS
250.0000 mg | ORAL_CAPSULE | Freq: Two times a day (BID) | ORAL | Status: DC
  Administered 2012-07-03 – 2012-07-14 (×19): 250 mg via ORAL
  Filled 2012-07-03 (×26): qty 1

## 2012-07-03 MED ORDER — PSYLLIUM 95 % PO PACK
1.0000 | PACK | Freq: Two times a day (BID) | ORAL | Status: DC
  Administered 2012-07-04 – 2012-07-14 (×12): 1 via ORAL
  Filled 2012-07-03 (×26): qty 1

## 2012-07-03 MED ORDER — DEXTROSE-NACL 5-0.9 % IV SOLN: INTRAVENOUS | Status: DC

## 2012-07-03 MED ORDER — FENTANYL CITRATE 0.05 MG/ML IJ SOLN
25.0000 ug | INTRAMUSCULAR | Status: DC | PRN
  Administered 2012-07-03: 25 ug via INTRAVENOUS
  Administered 2012-07-03 – 2012-07-05 (×3): 50 ug via INTRAVENOUS
  Administered 2012-07-12 (×2): 25 ug via INTRAVENOUS
  Administered 2012-07-13 – 2012-07-14 (×7): 50 ug via INTRAVENOUS
  Administered 2012-07-14: 25 ug via INTRAVENOUS
  Administered 2012-07-15: 50 ug via INTRAVENOUS
  Filled 2012-07-03 (×15): qty 2

## 2012-07-03 MED ORDER — ACETAMINOPHEN 500 MG PO TABS
1000.0000 mg | ORAL_TABLET | Freq: Three times a day (TID) | ORAL | Status: DC
  Administered 2012-07-03 – 2012-07-15 (×30): 1000 mg via ORAL
  Filled 2012-07-03 (×10): qty 2
  Filled 2012-07-03: qty 1
  Filled 2012-07-03 (×32): qty 2

## 2012-07-03 MED ORDER — MAGIC MOUTHWASH
15.0000 mL | Freq: Four times a day (QID) | ORAL | Status: DC | PRN
  Filled 2012-07-03: qty 15

## 2012-07-03 MED ORDER — POTASSIUM CHLORIDE CRYS ER 20 MEQ PO TBCR
20.0000 meq | EXTENDED_RELEASE_TABLET | Freq: Once | ORAL | Status: AC
  Administered 2012-07-03: 20 meq via ORAL
  Filled 2012-07-03: qty 1

## 2012-07-03 MED ORDER — VITAMIN D3 25 MCG (1000 UNIT) PO TABS
1000.0000 [IU] | ORAL_TABLET | Freq: Every morning | ORAL | Status: DC
  Administered 2012-07-03 – 2012-07-15 (×11): 1000 [IU] via ORAL
  Filled 2012-07-03 (×14): qty 1

## 2012-07-03 MED ORDER — BISMUTH SUBSALICYLATE 262 MG/15ML PO SUSP
30.0000 mL | Freq: Three times a day (TID) | ORAL | Status: DC | PRN
  Filled 2012-07-03: qty 236

## 2012-07-03 MED ORDER — ALUM & MAG HYDROXIDE-SIMETH 200-200-20 MG/5ML PO SUSP: 30.0000 mL | Freq: Four times a day (QID) | ORAL | Status: DC | PRN

## 2012-07-03 MED ORDER — LIP MEDEX EX OINT
1.0000 "application " | TOPICAL_OINTMENT | Freq: Two times a day (BID) | CUTANEOUS | Status: DC
  Administered 2012-07-03 – 2012-07-15 (×20): 1 via TOPICAL
  Filled 2012-07-03: qty 7

## 2012-07-03 MED ORDER — BISACODYL 10 MG RE SUPP: 10.0000 mg | Freq: Two times a day (BID) | RECTAL | Status: DC | PRN

## 2012-07-03 NOTE — Progress Notes (Signed)
Patient ID: Steven [name], male   DOB: 11-11-1950, 62 y.o.   MRN: [id number] [facility] Progress Note  Subjective: Feels "like hell"  Says pain is about the same, no nausea or vomiting. Not happy because not getting anything to eat. Says he used to drink heavily but not any in several years..  Objective:  Vital signs in last 24 hours: Temp:  [98 F (36.7 C)-98.7 F (37.1 C)] 98.7 F (37.1 C) (04/26 0458) Pulse Rate:  [101-105] 105 (04/26 0458) Resp:  [16-19] 18 (04/26 0458) BP: (99-133)/(66-79) 100/75 mmHg (04/26 0458) SpO2:  [96 %-100 %] 97 % (04/26 0458) Last BM Date: 07/01/12 General:   Alert,  Well-developed,  Elderly wm  in NAD Heart:  Regular rate and rhythm; no murmurs Pulm;clear Abdomen:  Soft, tender  Upper abdomen and nondistended. Normal bowel sounds, without guarding, and without rebound.   Extremities:  Without edema. Neurologic:  Alert and  oriented x2;  grossly normal neurologically. Psych:  Alert and cooperative. Normal mood and affect.  Intake/Output from previous day: 04/25 0701 - 04/26 0700 In: 650 [I.V.:550; IV Piggyback:100] Out: 1100 [Urine:1100] Intake/Output this shift:    Lab Results:  Recent Labs  07/01/12 0528 07/02/12 0530 07/03/12 0542  WBC 22.9* 22.5* 12.0*  HGB 10.1* 9.7* 8.7*  HCT 29.8* 28.8* 25.5*  PLT 259 293 297   BMET  Recent Labs  07/01/12 0528 07/02/12 0530 07/03/12 0542  NA 138 135 135  K 3.1* 3.2* 3.3*  CL 105 101 101  CO2 24 26 24   GLUCOSE 103* 106* 99  BUN 17 16 15   CREATININE 0.59 0.63 0.53  CALCIUM 7.6* 7.5* 7.4*   LFT  Recent Labs  07/01/12 0528  PROT 5.0*  ALBUMIN 1.9*  AST 53*  ALT 43  ALKPHOS 76  BILITOT 0.9     Assessment / Plan: #1  62 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 Developed bacteremia some days later and found on Ct to have acute necrotizing pancreatitis -etiology not clear LFT's have been  normal, no gallstones or ductal dilation, so not likely biliary , ?ischemic with surgery  Or sepsis?  He is improving , and WBC finally trending down Continue Primaxin Start clear liquid diet Consider MRI and MRCP for Monday to reassess pancreas/ necrosis and further delineate bilary tree Principal Problem:   Closed left hip fracture Active Problems:   CVA   Dementia   Fracture of left humerus   Leucocytosis   Bacteremia due to Enterococcus   Bacteremia due to Klebsiella pneumoniae   Abnormal finding on GI tract imaging   Pancreatitis, acute     LOS: 10 days   Amy Esterwood  07/03/2012, 1:08 PM  GI ATTENDING  History and laboratories reviewed. Patient personally seen and examined. Agree with history physical and assessments as outlined. Patient has acute pancreatitis. Etiology unclear. Stable. Leukocytosis improve. Continue with supportive care. At some point, not now, MRI/MRCP may be helpful. We'll follow  Wilhemina Bonito. Eda Keys., M.D. [facility] Division of Gastroenterology

## 2012-07-03 NOTE — Progress Notes (Addendum)
Steven [name] [id number] 1950/03/12  CARE TEAM:  PCP: Lehman Prom, NP  Outpatient Care Team: Patient Care Team: Chipper Oman, NP as PCP - General (Nurse Practitioner) Shelda Pal, MD as Consulting Physician (Orthopedic Surgery)  Inpatient Treatment Team: Treatment Team: Attending Provider: Pamella Pert, MD; Rounding Team: Lilyan Gilford, MD; Technician: Berlinda Last, [place]; Consulting Physician: Shelda Pal, MD; Registered Nurse: Louie Bun, RN; Registered Nurse: Virl Cagey, RN; Technician: Desrine Earlie Server, NT; Consulting Physician: Louis Meckel, MD; Consulting Physician: Bishop Limbo, MD; Technician: Lynwood Dawley, NT; Technician: Reyes Ivan, [place]; Technician: Bruna Potter, NT; Registered Nurse: Melodie Bouillon, RN; Registered Nurse: Shelly Rubenstein, RN   Subjective:  Grumpy Still with abdominal pain but stable Wants to drink/eat but appetite fair RNs in room "Leave me alone"  Objective:  Vital signs:  Filed Vitals:   07/02/12 2203 07/03/12 0000 07/03/12 0156 07/03/12 0458  BP: 99/66  133/79 100/75  Pulse: 104  101 105  Temp: 98.3 F (36.8 C)  98 F (36.7 C) 98.7 F (37.1 C)  TempSrc: Oral  Oral Oral  Resp: 18 16 19 18   Height:      Weight:      SpO2: 98% 96% 100% 97%    Last BM Date: 07/01/12  Intake/Output   Yesterday:  04/25 0701 - 04/26 0700 In: 650 [I.V.:550; IV Piggyback:100] Out: 1100 [Urine:1100] This shift:     Bowel function:  Flatus: y  BM: 4/24  Drain: n/a  Physical Exam:  General: Pt awake/alert/oriented x4 in no acute distress Eyes: PERRL, normal EOM.  Sclera clear.  No icterus Neuro: CN II-XII intact w/o focal sensory/motor deficits. Lymph: No head/neck/groin lymphadenopathy Psych:  No delerium/psychosis/paranoia HENT: Normocephalic, Mucus membranes moist.  No thrush Neck: Supple, No tracheal deviation Chest: No chest wall pain w good excursion CV:  Pulses intact.   Regular rhythm MS: Normal AROM mjr joints.  No obvious deformity Abdomen: Soft.  Nondistended.  Mildly tender centrally/epigastric.  No evidence of peritonitis.  No incarcerated hernias. Ext:  SCDs BLE.  No mjr edema.  No cyanosis Skin: No petechiae / purpura   Problem List:   Principal Problem:   Closed left hip fracture Active Problems:   CVA   Dementia   Fracture of left humerus   Leucocytosis   Bacteremia due to Enterococcus   Bacteremia due to Klebsiella pneumoniae   Abnormal finding on GI tract imaging   Pancreatitis, acute   Assessment  Steven [name]  62 y.o. male  8 Days Post-Op  Procedure(s): INTRAMEDULLARY (IM) NAIL FEMORAL  Pancreatitis stabilizing  Plan:  -sips only with abdominal pain -if WBC stays down & pain down, retry clears PO tomorrow - do not force PO  What is the etiology? EtOH?  U/S to evaluate for gallstones  ?Need MRCP to r/o CBD stones?    -agree w GI for CT scan soon unless markedly improves -continue Imipenem for now  -Hip Fx care per ortho -VTE prophylaxis- SCDs, etc -mobilize as tolerated to help recovery  [name], M.D., F.A.C.S. Gastrointestinal and Minimally Invasive Surgery [facility] [address] [place], [place] [address] [phone] Main / Paging   07/03/2012   Results:   Labs: Results for orders placed during the hospital encounter of 06/23/12 (from the past 48 hour(s))  CBC     Status: Abnormal   Collection Time    07/02/12  5:30 AM  Result Value Range   WBC 22.5 (*) 4.0 - 10.5 K/uL   RBC 3.46 (*) 4.22 - 5.81 MIL/uL   Hemoglobin 9.7 (*) 13.0 - 17.0 g/dL   HCT 16.1 (*) 09.6 - 04.5 %   MCV 83.2  78.0 - 100.0 fL   MCH 28.0  26.0 - 34.0 pg   MCHC 33.7  30.0 - 36.0 g/dL   RDW 40.9  81.1 - 91.4 %   Platelets 293  150 - 400 K/uL  BASIC METABOLIC PANEL     Status: Abnormal   Collection Time    07/02/12  5:30 AM      Result Value Range   Sodium 135  135 - 145 mEq/L    Potassium 3.2 (*) 3.5 - 5.1 mEq/L   Chloride 101  96 - 112 mEq/L   CO2 26  19 - 32 mEq/L   Glucose, Bld 106 (*) 70 - 99 mg/dL   BUN 16  6 - 23 mg/dL   Creatinine, Ser 7.82  0.50 - 1.35 mg/dL   Calcium 7.5 (*) 8.4 - 10.5 mg/dL   GFR calc non Af Amer >90  >90 mL/min   GFR calc Af Amer >90  >90 mL/min   Comment:            The eGFR has been calculated     using the CKD EPI equation.     This calculation has not been     validated in all clinical     situations.     eGFR's persistently     <90 mL/min signify     possible Chronic Kidney Disease.  CBC     Status: Abnormal   Collection Time    07/03/12  5:42 AM      Result Value Range   WBC 12.0 (*) 4.0 - 10.5 K/uL   RBC 3.03 (*) 4.22 - 5.81 MIL/uL   Hemoglobin 8.7 (*) 13.0 - 17.0 g/dL   HCT 95.6 (*) 21.3 - 08.6 %   MCV 84.2  78.0 - 100.0 fL   MCH 28.7  26.0 - 34.0 pg   MCHC 34.1  30.0 - 36.0 g/dL   RDW 57.8  46.9 - 62.9 %   Platelets 297  150 - 400 K/uL  BASIC METABOLIC PANEL     Status: Abnormal   Collection Time    07/03/12  5:42 AM      Result Value Range   Sodium 135  135 - 145 mEq/L   Potassium 3.3 (*) 3.5 - 5.1 mEq/L   Chloride 101  96 - 112 mEq/L   CO2 24  19 - 32 mEq/L   Glucose, Bld 99  70 - 99 mg/dL   BUN 15  6 - 23 mg/dL   Creatinine, Ser 5.28  0.50 - 1.35 mg/dL   Calcium 7.4 (*) 8.4 - 10.5 mg/dL   GFR calc non Af Amer >90  >90 mL/min   GFR calc Af Amer >90  >90 mL/min   Comment:            The eGFR has been calculated     using the CKD EPI equation.     This calculation has not been     validated in all clinical     situations.     eGFR's persistently     <90 mL/min signify     possible Chronic Kidney Disease.    Imaging / Studies: No results found.  Medications / Allergies: per chart  Antibiotics:  Anti-infectives   Start     Dose/Rate Route Frequency Ordered Stop   06/28/12 1630  imipenem-cilastatin (PRIMAXIN) 500 mg in sodium chloride 0.9 % 100 mL IVPB     500 mg 200 mL/hr over 30 Minutes  Intravenous Every 8 hours 06/28/12 1617     06/28/12 0800  imipenem-cilastatin (PRIMAXIN) 500 mg in sodium chloride 0.9 % 100 mL IVPB  Status:  Discontinued     500 mg 200 mL/hr over 30 Minutes Intravenous Every 8 hours 06/28/12 0718 06/28/12 1030   06/27/12 1400  metroNIDAZOLE (FLAGYL) tablet 500 mg  Status:  Discontinued     500 mg Oral 3 times per day 06/27/12 1032 06/27/12 1229   06/26/12 2000  vancomycin (VANCOCIN) IVPB 1000 mg/200 mL premix  Status:  Discontinued     1,000 mg 200 mL/hr over 60 Minutes Intravenous Every 8 hours 06/26/12 1200 06/27/12 1229   06/26/12 1215  vancomycin (VANCOCIN) IVPB 1000 mg/200 mL premix     1,000 mg 200 mL/hr over 60 Minutes Intravenous  Once 06/26/12 1203 06/26/12 1315   06/25/12 0800  levofloxacin (LEVAQUIN) IVPB 750 mg  Status:  Discontinued     750 mg 100 mL/hr over 90 Minutes Intravenous Every 24 hours 06/24/12 1014 06/27/12 1039   06/25/12 0700  clindamycin (CLEOCIN) IVPB 900 mg  Status:  Discontinued     900 mg 100 mL/hr over 30 Minutes Intravenous  Once 06/25/12 0912 06/27/12 1205   06/24/12 2200  vancomycin (VANCOCIN) IVPB 750 mg/150 ml premix  Status:  Discontinued     750 mg 150 mL/hr over 60 Minutes Intravenous Every 12 hours 06/24/12 1014 06/26/12 1200   06/24/12 1400  aztreonam (AZACTAM) 1 g in dextrose 5 % 50 mL IVPB  Status:  Discontinued     1 g 100 mL/hr over 30 Minutes Intravenous 3 times per day 06/24/12 1014 06/27/12 1229   06/24/12 0415  levofloxacin (LEVAQUIN) IVPB 750 mg     750 mg 100 mL/hr over 90 Minutes Intravenous  Once 06/24/12 0400 06/24/12 0557   06/24/12 0415  vancomycin (VANCOCIN) IVPB 1000 mg/200 mL premix     1,000 mg 200 mL/hr over 60 Minutes Intravenous  Once 06/24/12 0400 06/24/12 0654   06/24/12 0415  aztreonam (AZACTAM) 2 g in dextrose 5 % 50 mL IVPB     2 g 100 mL/hr over 30 Minutes Intravenous  Once 06/24/12 0409 06/24/12 1610

## 2012-07-03 NOTE — Progress Notes (Signed)
[facility] PROGRESS NOTE  Steven [name]:[id number] DOB: December 24, 1950 DOA: 06/23/2012 PCP: Lehman Prom, NP  Assessment/Plan:                                                                                                                1. Necrotizing pancreatitis - Unclear etiology. Lipase levels within normal limits. LFT'S within normal limits. GI consulted from [place]. May be with peripancreatic abscess. - still has low-grade temperature this morning - Leukocytosis significantly improved today. - surgery following for potential debridement, no surgical indication yet - US abdomen pending today  2. Left femoral intertrochanteric fracture -  - Underwent ORIF of the left femur on 4/18. PT/OT evaluation , plan for SNF placement. Pain control.  3. Sinus tachycardia - May be pain, related due to dehydration or most likely due to #1.  CT angiogram of the chest to rule out PE was negative. Free t4 and TSH within normal limits. Echocardiogram showed good LVEF without any wall abn. His tachycardia improved and he is b blockers.  4. History of CVA with left-sided hemiparesis - initially plavix held  For surgery of the left hip. Back on plavix .  5. Dementia - no acute issues. 6. Chronic left humerus fracture.; no acute issues.  7. Polymicrobial bacteremia/Leukocytosis - C diff negative. patient is afebrile. Chest x-ray and UA are unremarkable except the chest x-ray showing mild congestion. He was found to have polymicrobial bacteremia with Positive Blood cultures showing enterococcus and klebsiella. ID consulted for further recommendations. CT abd and pelvis was done and showed necrotizing pancreatitis. Started him on primaxin.   - ID following, appreciate input - continue antibiotics  8. Hypokalemia; replete as needed.  9. DVT prophylaxis on lovenox.   Code Status: full code Family Communication: none at bedside Disposition Plan: SNF when medically stable  PROCEDURE: Open reduction  and internal fixation of the left comminuted intertrochanteric femur fracture on 4/18  Consultants:  Cardiology for tachycardic  Orthopedics   ID Dr Orvan Falconer.  GI Dr. Arlyce Dice  HPI/Subjective: - no acute complaints but endorses worsening abdominal pain when asked and poor appetite.  Objective: Filed Vitals:   07/02/12 2203 07/03/12 0000 07/03/12 0156 07/03/12 0458  BP: 99/66  133/79 100/75  Pulse: 104  101 105  Temp: 98.3 F (36.8 C)  98 F (36.7 C) 98.7 F (37.1 C)  TempSrc: Oral  Oral Oral  Resp: 18 16 19 18   Height:      Weight:      SpO2: 98% 96% 100% 97%    Intake/Output Summary (Last 24 hours) at 07/03/12 1125 Last data filed at 07/03/12 0650  Gross per 24 hour  Intake    650 ml  Output   1100 ml  Net   -450 ml   Filed Weights   06/24/12 0837  Weight: 64.411 kg (142 lb)    Exam:  General: Well-developed well-nourished.  Cardiovascular: S1-S2 heard tachycardic.  Respiratory: No rhonchi or crepitations.  Abdomen: Soft with mild tenderness bowel sounds present.  Musculoskeletal: Pain on moving left shoulder and left leg.  Neurologic: Has left-sided weakness from old stroke  Data Reviewed: Basic Metabolic Panel:  Recent Labs Lab 06/27/12 0520 06/28/12 0855  06/29/12 0450 06/30/12 0510 07/01/12 0528 07/02/12 0530 07/03/12 0542  NA 132* 133*  < > 134* 136 138 135 135  K 2.9* 2.9*  < > 3.1* 2.9* 3.1* 3.2* 3.3*  CL 98 102  < > 101 105 105 101 101  CO2 26 25  < > 23 22 24 26 24   GLUCOSE 138* 108*  < > 124* 130* 103* 106* 99  BUN 17 16  < > 18 21 17 16 15   CREATININE 0.63 0.62  < > 0.60 0.56 0.59 0.63 0.53  CALCIUM 7.8* 7.5*  < > 7.6* 7.5* 7.6* 7.5* 7.4*  MG 2.1 2.3  --  2.4  --   --   --   --   < > = values in this interval not displayed. Liver Function Tests:  Recent Labs Lab 06/28/12 1740 06/30/12 0510 07/01/12 0528  AST 34 28 53*  ALT 41 33 43  ALKPHOS 69 70 76  BILITOT 0.6 0.8 0.9  PROT 5.2* 5.0* 5.0*  ALBUMIN 1.9* 1.9* 1.9*     Recent Labs Lab 06/28/12 1740  LIPASE 22  AMYLASE 53   CBC:  Recent Labs Lab 06/29/12 0450 06/30/12 0510 07/01/12 0528 07/02/12 0530 07/03/12 0542  WBC 14.1* 19.7* 22.9* 22.5* 12.0*  HGB 10.1* 10.5* 10.1* 9.7* 8.7*  HCT 30.1* 30.0* 29.8* 28.8* 25.5*  MCV 82.5 82.2 83.7 83.2 84.2  PLT 192 227 259 293 297   BNP (last 3 results)  Recent Labs  06/24/12 0405 06/24/12 0551  PROBNP 1874.0* 1830.0*   Recent Results (from the past 240 hour(s))  CULTURE, BLOOD (ROUTINE X 2)     Status: None   Collection Time    06/24/12  4:05 AM      Result Value Range Status   Specimen Description BLOOD LEFT ARM   Final   Special Requests BOTTLES DRAWN AEROBIC AND ANAEROBIC 4CC   Final   Culture  Setup Time 06/24/2012 08:30   Final   Culture     Final   Value: GRAM NEGATIVE RODS     ENTEROCOCCUS SPECIES     Note: Gram Stain Report Called to,Read Back By and Verified With: MONIQUE HOWLETT ON 06/24/2012 AT 11:16P BY WILEJ   Report Status 06/27/2012 FINAL   Final  CULTURE, BLOOD (ROUTINE X 2)     Status: None   Collection Time    06/24/12  4:05 AM      Result Value Range Status   Specimen Description BLOOD RIGHT ANTECUBITAL   Final   Special Requests BOTTLES DRAWN AEROBIC AND ANAEROBIC 5CC   Final   Culture  Setup Time 06/24/2012 08:30   Final   Culture     Final   Value: ENTEROCOCCUS SPECIES     Note: COMBINATION THERAPY OF HIGH DOSE AMPICILLIN OR VANCOMYCIN, PLUS AN AMINOGLYCOSIDE, IS USUALLY INDICATED FOR SERIOUS ENTEROCOCCAL INFECTIONS.     KLEBSIELLA PNEUMONIAE     Note: Gram Stain Report Called to,Read Back By and Verified With: MONIQUE HOWLETT ON 06/24/2012 AT 11:16P   Report Status 06/27/2012 FINAL   Final   Organism ID, Bacteria ENTEROCOCCUS SPECIES   Final   Organism ID, Bacteria KLEBSIELLA PNEUMONIAE   Final  MRSA PCR SCREENING     Status: None   Collection Time    06/24/12  4:52 AM      Result Value Range Status   MRSA by PCR NEGATIVE  NEGATIVE Final   Comment:             The GeneXpert MRSA Assay (FDA     approved for NASAL specimens     only), is one component of a     comprehensive MRSA colonization     surveillance program. It is not     intended to diagnose MRSA     infection nor to guide or     monitor treatment for     MRSA infections.  CULTURE, BLOOD (ROUTINE X 2)     Status: None   Collection Time    06/27/12  1:01 PM      Result Value Range Status   Specimen Description BLOOD LEFT ARM   Final   Special Requests BOTTLES DRAWN AEROBIC ONLY 1CC   Final   Culture  Setup Time 06/27/2012 17:11   Final   Culture     Final   Value:        BLOOD CULTURE RECEIVED NO GROWTH TO DATE CULTURE WILL BE HELD FOR 5 DAYS BEFORE ISSUING A FINAL NEGATIVE REPORT   Report Status PENDING   Incomplete  CULTURE, BLOOD (ROUTINE X 2)     Status: None   Collection Time    06/27/12  1:21 PM      Result Value Range Status   Specimen Description BLOOD LEFT HAND   Final   Special Requests BOTTLES DRAWN AEROBIC ONLY 1CC   Final   Culture  Setup Time 06/27/2012 17:10   Final   Culture     Final   Value:        BLOOD CULTURE RECEIVED NO GROWTH TO DATE CULTURE WILL BE HELD FOR 5 DAYS BEFORE ISSUING A FINAL NEGATIVE REPORT   Report Status PENDING   Incomplete  URINE CULTURE     Status: None   Collection Time    06/29/12  6:20 AM      Result Value Range Status   Specimen Description URINE, RANDOM   Final   Special Requests Immunocompromised   Final   Culture  Setup Time 06/29/2012 08:38   Final   Colony Count NO GROWTH   Final   Culture NO GROWTH   Final   Report Status 06/30/2012 FINAL   Final  CLOSTRIDIUM DIFFICILE BY PCR     Status: None   Collection Time    06/29/12  4:45 PM      Result Value Range Status   C difficile by pcr NEGATIVE  NEGATIVE Final     Studies: No results found.  Scheduled Meds: . acetaminophen  1,000 mg Oral TID  . cholecalciferol  1,000 Units Oral q morning - 10a  . clopidogrel  75 mg Oral Q breakfast  . enoxaparin (LOVENOX)  injection  40 mg Subcutaneous Q24H  . ferrous sulfate  325 mg Oral TID PC  . imipenem-cilastatin  500 mg Intravenous Q8H  . lip balm  1 application Topical BID  . metoprolol tartrate  50 mg Oral BID  . multivitamin with minerals  1 tablet Oral q morning - 10a  . pantoprazole  40 mg Oral Daily  . potassium chloride  40 mEq Oral Once  . psyllium  1 packet Oral BID  . saccharomyces boulardii  250 mg Oral BID  . tamsulosin  0.4 mg Oral QHS   Continuous Infusions: . lactated ringers 50 mL/hr at  07/03/12 1610    Principal Problem:   Closed left hip fracture Active Problems:   CVA   Dementia   Fracture of left humerus   Leucocytosis   Bacteremia due to Enterococcus   Bacteremia due to Klebsiella pneumoniae   Abnormal finding on GI tract imaging   Pancreatitis, acute   Time spent: 25 min  Pamella Pert  [facility] Pager [phone] If 7PM-7AM, please contact night-coverage at [email or web address], password [facility] 07/03/2012, 11:25 AM  LOS: 10 days

## 2012-07-04 LAB — CBC
HCT: 27.3 % — ABNORMAL LOW (ref 39.0–52.0)
Platelets: 363 10*3/uL (ref 150–400)
RDW: 13.5 % (ref 11.5–15.5)
WBC: 11.3 10*3/uL — ABNORMAL HIGH (ref 4.0–10.5)

## 2012-07-04 LAB — LIPASE, BLOOD: Lipase: 38 U/L (ref 11–59)

## 2012-07-04 LAB — MAGNESIUM: Magnesium: 1.8 mg/dL (ref 1.5–2.5)

## 2012-07-04 MED ORDER — POTASSIUM CHLORIDE CRYS ER 20 MEQ PO TBCR
30.0000 meq | EXTENDED_RELEASE_TABLET | Freq: Once | ORAL | Status: AC
  Administered 2012-07-04: 30 meq via ORAL
  Filled 2012-07-04 (×2): qty 1

## 2012-07-04 NOTE — Progress Notes (Signed)
[facility] PROGRESS NOTE  Steven [name] BMW:[id number] DOB: Nov 03, 1950 DOA: 06/23/2012 PCP: Lehman Prom, NP  Assessment/Plan:                                                                                                               1. Necrotizing pancreatitis - still has low-grade temperature this morning - Leukocytosis significantly improved today. - surgery following for potential debridement, no surgical indication yet - US abdomen show gallstones which may suggest an etiology; chole? Per surgery  2. Left femoral intertrochanteric fracture -  - Underwent ORIF of the left femur on 4/18. PT/OT evaluation, plan for SNF placement. Pain control.  3. Sinus tachycardia - May be pain, related due to dehydration or most likely due to #1.  CT angiogram of the chest to rule out PE was negative. Free t4 and TSH within normal limits. Echocardiogram showed good LVEF without any wall abn. His tachycardia improved and he is b blockers. Stable, discontinue telemetry 4. History of CVA with left-sided hemiparesis - initially plavix held  For surgery of the left hip. Back on Plavix, may need to be stopped again if planning chole.  5. Dementia - no acute issues. 6. Chronic left humerus fracture. no acute issues.  7. Polymicrobial bacteremia/Leukocytosis - C diff negative. patient is afebrile. Chest x-ray and UA are unremarkable except the chest x-ray showing mild congestion. He was found to have polymicrobial bacteremia with Positive Blood cultures showing enterococcus and klebsiella. ID consulted for further recommendations. CT abd and pelvis was done and showed necrotizing pancreatitis. Started him on primaxin. - ID following, appreciate input - continue antibiotics 8. Hypokalemia; replete as needed  9. DVT prophylaxis on lovenox.   Code Status: full code Family Communication: none at bedside Disposition Plan: SNF when medically stable  PROCEDURE: Open reduction and internal fixation of  the left comminuted intertrochanteric femur fracture on 4/18  Consultants:  Cardiology for tachycardic  Orthopedics   ID Dr Orvan Falconer.  GI Dr. Arlyce Dice  HPI/Subjective: - unclear about how he feels.   Objective: Filed Vitals:   07/03/12 2041 07/04/12 0000 07/04/12 0217 07/04/12 0400  BP: 124/56  114/85   Pulse: 104  107   Temp: 99 F (37.2 C)  98.2 F (36.8 C)   TempSrc: Oral  Oral   Resp: 19 19 19 19   Height:      Weight:      SpO2: 97% 96% 97% 97%    Intake/Output Summary (Last 24 hours) at 07/04/12 0809 Last data filed at 07/04/12 0603  Gross per 24 hour  Intake 1952.5 ml  Output      0 ml  Net 1952.5 ml   Filed Weights   06/24/12 0837  Weight: 64.411 kg (142 lb)    Exam:  General: Well-developed well-nourished.  Cardiovascular: S1-S2 heard tachycardic.  Respiratory: No rhonchi or crepitations.  Abdomen: Soft with mild tenderness bowel sounds present.   Musculoskeletal: no edema Neurologic: Has left-sided weakness from old stroke  Data Reviewed: Basic Metabolic Panel:  Recent Labs Lab  06/28/12 0855  06/29/12 0450 06/30/12 0510 07/01/12 0528 07/02/12 0530 07/03/12 0542 07/04/12 0510  NA 133*  < > 134* 136 138 135 135  --   K 2.9*  < > 3.1* 2.9* 3.1* 3.2* 3.3* 3.3*  CL 102  < > 101 105 105 101 101  --   CO2 25  < > 23 22 24 26 24   --   GLUCOSE 108*  < > 124* 130* 103* 106* 99  --   BUN 16  < > 18 21 17 16 15   --   CREATININE 0.62  < > 0.60 0.56 0.59 0.63 0.53  --   CALCIUM 7.5*  < > 7.6* 7.5* 7.6* 7.5* 7.4*  --   MG 2.3  --  2.4  --   --   --   --  1.8  < > = values in this interval not displayed. Liver Function Tests:  Recent Labs Lab 06/28/12 1740 06/30/12 0510 07/01/12 0528  AST 34 28 53*  ALT 41 33 43  ALKPHOS 69 70 76  BILITOT 0.6 0.8 0.9  PROT 5.2* 5.0* 5.0*  ALBUMIN 1.9* 1.9* 1.9*    Recent Labs Lab 06/28/12 1740 07/04/12 0510  LIPASE 22 38  AMYLASE 53  --    CBC:  Recent Labs Lab 06/30/12 0510 07/01/12 0528  07/02/12 0530 07/03/12 0542 07/04/12 0510  WBC 19.7* 22.9* 22.5* 12.0* 11.3*  HGB 10.5* 10.1* 9.7* 8.7* 9.4*  HCT 30.0* 29.8* 28.8* 25.5* 27.3*  MCV 82.2 83.7 83.2 84.2 82.7  PLT 227 259 293 297 363   BNP (last 3 results)  Recent Labs  06/24/12 0405 06/24/12 0551  PROBNP 1874.0* 1830.0*   Recent Results (from the past 240 hour(s))  CULTURE, BLOOD (ROUTINE X 2)     Status: None   Collection Time    06/27/12  1:01 PM      Result Value Range Status   Specimen Description BLOOD LEFT ARM   Final   Special Requests BOTTLES DRAWN AEROBIC ONLY 1CC   Final   Culture  Setup Time 06/27/2012 17:11   Final   Culture NO GROWTH 5 DAYS   Final   Report Status 07/03/2012 FINAL   Final  CULTURE, BLOOD (ROUTINE X 2)     Status: None   Collection Time    06/27/12  1:21 PM      Result Value Range Status   Specimen Description BLOOD LEFT HAND   Final   Special Requests BOTTLES DRAWN AEROBIC ONLY 1CC   Final   Culture  Setup Time 06/27/2012 17:10   Final   Culture NO GROWTH 5 DAYS   Final   Report Status 07/03/2012 FINAL   Final  URINE CULTURE     Status: None   Collection Time    06/29/12  6:20 AM      Result Value Range Status   Specimen Description URINE, RANDOM   Final   Special Requests Immunocompromised   Final   Culture  Setup Time 06/29/2012 08:38   Final   Colony Count NO GROWTH   Final   Culture NO GROWTH   Final   Report Status 06/30/2012 FINAL   Final  CLOSTRIDIUM DIFFICILE BY PCR     Status: None   Collection Time    06/29/12  4:45 PM      Result Value Range Status   C difficile by pcr NEGATIVE  NEGATIVE Final     Studies: US Abdomen Complete  07/03/2012  *  RADIOLOGY REPORT*  Clinical Data:  62 year old male with abdominal pain.  History of pancreatitis and hepatitis.  ABDOMINAL ULTRASOUND COMPLETE  Comparison:  None.  Findings:  Gallbladder:   Multiple mobile gallstones are noted, the largest measuring 1.8 cm.  There is no evidence of gallbladder wall thickening,  pericholecystic fluid or sonographic Murphy's sign.  Common Bile Duct: Mild intrahepatic and extrahepatic biliary prominence is noted.  The CBD measures 13 mm in greatest diameter.  Liver:  The liver is within normal limits in parenchymal echogenicity. No focal abnormalities are identified.  IVC:  The IVC is not well visualized.  Pancreas: The pancreas is not well visualized secondary to overlying bowel gas.  Spleen:  Within normal limits in size and echotexture.  Right kidney:  The right kidney is normal in size and parenchymal echogenicity.  There is no evidence of solid mass, hydronephrosis or definite renal calculi.  The right kidney measures 12.6 cm.  Left kidney:  The left kidney is normal in size and parenchymal echogenicity.  There is no evidence of solid mass, hydronephrosis or definite renal calculi.   The left kidney measures 10.0 cm.  Abdominal Aorta:   The abdominal aorta is not well visualized secondary to overlying bowel gas.  A small amount of perihepatic ascites is noted as well as a right pleural effusion.  IMPRESSION: Cholelithiasis without evidence of acute cholecystitis.  Mild intrahepatic and extrahepatic biliary dilatation without obstructing cause identified.  Small amount of ascites and right pleural effusion.  IVC, pancreas and abdominal aorta not well visualized.   Original Report Authenticated By: Harmon Pier, M.D.     Scheduled Meds: . acetaminophen  1,000 mg Oral TID  . cholecalciferol  1,000 Units Oral q morning - 10a  . clopidogrel  75 mg Oral Q breakfast  . enoxaparin (LOVENOX) injection  40 mg Subcutaneous Q24H  . imipenem-cilastatin  500 mg Intravenous Q8H  . lip balm  1 application Topical BID  . metoprolol tartrate  50 mg Oral BID  . multivitamin with minerals  1 tablet Oral q morning - 10a  . pantoprazole  40 mg Oral Daily  . potassium chloride  30 mEq Oral Once  . psyllium  1 packet Oral BID  . saccharomyces boulardii  250 mg Oral BID  . tamsulosin  0.4 mg Oral  QHS   Continuous Infusions: . lactated ringers 50 mL/hr at 07/04/12 0603    Principal Problem:   Closed left hip fracture Active Problems:   CVA   Dementia   Fracture of left humerus   Leucocytosis   Bacteremia due to Enterococcus   Bacteremia due to Klebsiella pneumoniae   Abnormal finding on GI tract imaging   Pancreatitis, acute   Time spent: 25 min  Pamella Pert  [facility] Pager [phone] If 7PM-7AM, please contact night-coverage at [email or web address], password [facility] 07/04/2012, 8:09 AM  LOS: 11 days

## 2012-07-04 NOTE — Progress Notes (Signed)
9 Days Post-Op  Subjective: Still complaining of abdominal pain  Objective: Vital signs in last 24 hours: Temp:  [98.2 F (36.8 C)-99 F (37.2 C)] 98.2 F (36.8 C) (04/27 0217) Pulse Rate:  [95-107] 107 (04/27 0217) Resp:  [18-19] 19 (04/27 0400) BP: (114-124)/(53-85) 114/85 mmHg (04/27 0217) SpO2:  [95 %-97 %] 97 % (04/27 0400) Last BM Date: 07/01/12  Intake/Output from previous day: 04/26 0701 - 04/27 0700 In: 1952.5 [I.V.:1552.5; IV Piggyback:400] Out: -  Intake/Output this shift:    PE: General- In NAD Abdomen-soft, no significant tenderness, no mass  Lab Results:   Recent Labs  07/03/12 0542 07/04/12 0510  WBC 12.0* 11.3*  HGB 8.7* 9.4*  HCT 25.5* 27.3*  PLT 297 363   BMET  Recent Labs  07/02/12 0530 07/03/12 0542 07/04/12 0510  NA 135 135  --   K 3.2* 3.3* 3.3*  CL 101 101  --   CO2 26 24  --   GLUCOSE 106* 99  --   BUN 16 15  --   CREATININE 0.63 0.53  --   CALCIUM 7.5* 7.4*  --    PT/INR No results found for this basename: LABPROT, INR,  in the last 72 hours Comprehensive Metabolic Panel:    Component Value Date/Time   NA 135 07/03/2012 0542   K 3.3* 07/04/2012 0510   CL 101 07/03/2012 0542   CO2 24 07/03/2012 0542   BUN 15 07/03/2012 0542   CREATININE 0.53 07/03/2012 0542   GLUCOSE 99 07/03/2012 0542   CALCIUM 7.4* 07/03/2012 0542   AST 53* 07/01/2012 0528   ALT 43 07/01/2012 0528   ALKPHOS 76 07/01/2012 0528   BILITOT 0.9 07/01/2012 0528   PROT 5.0* 07/01/2012 0528   ALBUMIN 1.9* 07/01/2012 0528     Studies/Results: US Abdomen Complete  07/03/2012  *RADIOLOGY REPORT*  Clinical Data:  62 year old male with abdominal pain.  History of pancreatitis and hepatitis.  ABDOMINAL ULTRASOUND COMPLETE  Comparison:  None.  Findings:  Gallbladder:   Multiple mobile gallstones are noted, the largest measuring 1.8 cm.  There is no evidence of gallbladder wall thickening, pericholecystic fluid or sonographic Murphy's sign.  Common Bile Duct: Mild intrahepatic  and extrahepatic biliary prominence is noted.  The CBD measures 13 mm in greatest diameter.  Liver:  The liver is within normal limits in parenchymal echogenicity. No focal abnormalities are identified.  IVC:  The IVC is not well visualized.  Pancreas: The pancreas is not well visualized secondary to overlying bowel gas.  Spleen:  Within normal limits in size and echotexture.  Right kidney:  The right kidney is normal in size and parenchymal echogenicity.  There is no evidence of solid mass, hydronephrosis or definite renal calculi.  The right kidney measures 12.6 cm.  Left kidney:  The left kidney is normal in size and parenchymal echogenicity.  There is no evidence of solid mass, hydronephrosis or definite renal calculi.   The left kidney measures 10.0 cm.  Abdominal Aorta:   The abdominal aorta is not well visualized secondary to overlying bowel gas.  A small amount of perihepatic ascites is noted as well as a right pleural effusion.  IMPRESSION: Cholelithiasis without evidence of acute cholecystitis.  Mild intrahepatic and extrahepatic biliary dilatation without obstructing cause identified.  Small amount of ascites and right pleural effusion.  IVC, pancreas and abdominal aorta not well visualized.   Original Report Authenticated By: Harmon Pier, M.D.     Anti-infectives: Anti-infectives   Start  Dose/Rate Route Frequency Ordered Stop   06/28/12 1630  imipenem-cilastatin (PRIMAXIN) 500 mg in sodium chloride 0.9 % 100 mL IVPB     500 mg 200 mL/hr over 30 Minutes Intravenous Every 8 hours 06/28/12 1617     06/28/12 0800  imipenem-cilastatin (PRIMAXIN) 500 mg in sodium chloride 0.9 % 100 mL IVPB  Status:  Discontinued     500 mg 200 mL/hr over 30 Minutes Intravenous Every 8 hours 06/28/12 0718 06/28/12 1030   06/27/12 1400  metroNIDAZOLE (FLAGYL) tablet 500 mg  Status:  Discontinued     500 mg Oral 3 times per day 06/27/12 1032 06/27/12 1229   06/26/12 2000  vancomycin (VANCOCIN) IVPB 1000 mg/200 mL  premix  Status:  Discontinued     1,000 mg 200 mL/hr over 60 Minutes Intravenous Every 8 hours 06/26/12 1200 06/27/12 1229   06/26/12 1215  vancomycin (VANCOCIN) IVPB 1000 mg/200 mL premix     1,000 mg 200 mL/hr over 60 Minutes Intravenous  Once 06/26/12 1203 06/26/12 1315   06/25/12 0800  levofloxacin (LEVAQUIN) IVPB 750 mg  Status:  Discontinued     750 mg 100 mL/hr over 90 Minutes Intravenous Every 24 hours 06/24/12 1014 06/27/12 1039   06/25/12 0700  clindamycin (CLEOCIN) IVPB 900 mg  Status:  Discontinued     900 mg 100 mL/hr over 30 Minutes Intravenous  Once 06/25/12 0912 06/27/12 1205   06/24/12 2200  vancomycin (VANCOCIN) IVPB 750 mg/150 ml premix  Status:  Discontinued     750 mg 150 mL/hr over 60 Minutes Intravenous Every 12 hours 06/24/12 1014 06/26/12 1200   06/24/12 1400  aztreonam (AZACTAM) 1 g in dextrose 5 % 50 mL IVPB  Status:  Discontinued     1 g 100 mL/hr over 30 Minutes Intravenous 3 times per day 06/24/12 1014 06/27/12 1229   06/24/12 0415  levofloxacin (LEVAQUIN) IVPB 750 mg     750 mg 100 mL/hr over 90 Minutes Intravenous  Once 06/24/12 0400 06/24/12 0557   06/24/12 0415  vancomycin (VANCOCIN) IVPB 1000 mg/200 mL premix     1,000 mg 200 mL/hr over 60 Minutes Intravenous  Once 06/24/12 0400 06/24/12 0654   06/24/12 0415  aztreonam (AZACTAM) 2 g in dextrose 5 % 50 mL IVPB     2 g 100 mL/hr over 30 Minutes Intravenous  Once 06/24/12 0409 06/24/12 4098      Assessment Principal Problem:  Acute necrotizing pancreatitis-improving; US shows gallstones and mild biliary dilatation so gallstone etiology likely   LOS: 11 days   Plan:   Do not advance diet today.  Eventually will need cholecystectomy.   Steven [name] J 07/04/2012

## 2012-07-04 NOTE — Progress Notes (Signed)
Patient ID: Steven [name], male   DOB: 11-11-1950, 62 y.o.   MRN: [id number] [facility] Progress Note  Subjective:  About the same, taking small amts of clears. Still c/o abdominal pain when asked, denies nausea.    Abd [place] 4/26 -multiple gallstones, no wall thickening, and CBD 13 mm.  Objective:  Vital signs in last 24 hours: Temp:  [97.8 F (36.6 C)-99 F (37.2 C)] 97.8 F (36.6 C) (04/27 0900) Pulse Rate:  [95-128] 128 (04/27 0900) Resp:  [18-29] 29 (04/27 0900) BP: (107-124)/(53-85) 107/75 mmHg (04/27 0900) SpO2:  [95 %-99 %] 99 % (04/27 0900) Last BM Date: 07/03/12 General:   Alert,  Well-developed, older wm   in NAD Heart:  Regular rate and rhythm; no murmurs Pulm;clear Abdomen:  Soft, tender upper abdomen and nondistended. Normal bowel sounds, without guarding, and without rebound.   Extremities:  Without edema. Neurologic:  Alert and  oriented x4;  grossly normal neurologically. Psych:  Alert and cooperative. Normal mood and affect.  Intake/Output from previous day: 04/26 0701 - 04/27 0700 In: 1952.5 [I.V.:1552.5; IV Piggyback:400] Out: -  Intake/Output this shift:    Lab Results:  Lipase normal... Lipase     Component Value Date/Time   LIPASE 38 07/04/2012 0510     Recent Labs  07/02/12 0530 07/03/12 0542 07/04/12 0510  WBC 22.5* 12.0* 11.3*  HGB 9.7* 8.7* 9.4*  HCT 28.8* 25.5* 27.3*  PLT 293 297 363   BMET  Recent Labs  07/02/12 0530 07/03/12 0542 07/04/12 0510  NA 135 135  --   K 3.2* 3.3* 3.3*  CL 101 101  --   CO2 26 24  --   GLUCOSE 106* 99  --   BUN 16 15  --   CREATININE 0.63 0.53  --   CALCIUM 7.5* 7.4*  --      Assessment / Plan: #1 62 yo male with dementia admitted after a fall with left hip frx-developed bacteremia 4/17- Enteroccocal and Klebsiella. Workup with Ct abdomen then revealed what appears to be a focal pancreatitis with probable necrosis-however he never had an elevated lipase or elevated LFT's. [place]  yesterday shows multiple gallstones and CBd of 13 mm- again persistently normal LFT's and lipase. Doesn't seem c/w biliary pancreatitis but possible-wonder if he has another process, underlying malignancy. Had discussed MRI and MRCP and will order for tomorrow as may offer additional explanation . Stable, leukocytosis resolving,,continue Abx and clears Principal Problem:   Closed left hip fracture Active Problems:   CVA   Dementia   Fracture of left humerus   Leucocytosis   Bacteremia due to Enterococcus   Bacteremia due to Klebsiella pneumoniae   Abnormal finding on GI tract imaging   Pancreatitis, acute     LOS: 11 days   Amy Esterwood  07/04/2012, 10:05 AM   GI ATTENDING  Patient seen and examined personally. Agree with history, labs, exam as outlined. Presumed pancreatitis discovered after polymicrobial sepsis. Agree with plans for re\re imaging in the form of MRI to assess the pancreas and surrounding regions. Patient currently stable with improving white count. Okay to advance diet as tolerated.  Wilhemina Bonito. Eda Keys., M.D. [facility] Division of Gastroenterology

## 2012-07-05 ENCOUNTER — Encounter (HOSPITAL_COMMUNITY): Payer: Self-pay | Admitting: Radiology

## 2012-07-05 ENCOUNTER — Inpatient Hospital Stay (HOSPITAL_COMMUNITY): Payer: Medicaid Other

## 2012-07-05 DIAGNOSIS — K8020 Calculus of gallbladder without cholecystitis without obstruction: Secondary | ICD-10-CM

## 2012-07-05 DIAGNOSIS — K838 Other specified diseases of biliary tract: Secondary | ICD-10-CM

## 2012-07-05 DIAGNOSIS — B9689 Other specified bacterial agents as the cause of diseases classified elsewhere: Secondary | ICD-10-CM

## 2012-07-05 LAB — CBC
Hemoglobin: 9.4 g/dL — ABNORMAL LOW (ref 13.0–17.0)
MCHC: 34.6 g/dL (ref 30.0–36.0)
Platelets: 384 10*3/uL (ref 150–400)
RDW: 13.4 % (ref 11.5–15.5)

## 2012-07-05 MED ORDER — SODIUM CHLORIDE 0.9 % IV SOLN
500.0000 mg | Freq: Three times a day (TID) | INTRAVENOUS | Status: DC
  Administered 2012-07-05 – 2012-07-15 (×29): 500 mg via INTRAVENOUS
  Filled 2012-07-05 (×29): qty 500

## 2012-07-05 MED ORDER — IOHEXOL 350 MG/ML SOLN
100.0000 mL | Freq: Once | INTRAVENOUS | Status: AC | PRN
  Administered 2012-07-05: 100 mL via INTRAVENOUS

## 2012-07-05 MED ORDER — POTASSIUM CHLORIDE CRYS ER 20 MEQ PO TBCR
20.0000 meq | EXTENDED_RELEASE_TABLET | Freq: Every day | ORAL | Status: DC
  Administered 2012-07-05 – 2012-07-15 (×9): 20 meq via ORAL
  Filled 2012-07-05 (×11): qty 1

## 2012-07-05 NOTE — Progress Notes (Signed)
Received order for rw.  Will be delivered to hospital room prior to d/c.

## 2012-07-05 NOTE — Progress Notes (Signed)
Asked to start a peripheral IV for CT scan of abd with #20G catheter.  Attempted X1 in left hand without success.  Pt is very restless and moving around in the bed.   Left arm is contracted.   Some of his speech is appropriate and then it is not.  Did not see anything else to attempt.  IV in right anterior forearm is slightly swollen at PIV site.  IVFs turned off.  Primary RN made aware.

## 2012-07-05 NOTE — Progress Notes (Signed)
ANTIBIOTIC CONSULT NOTE - follow up  Pharmacy Consult for Primaxin  Indication: Bacteremia and peri-pancreatic abscess  Allergies  Allergen Reactions  . Cefazolin Rash  . Penicillins     REACTION: unknown allergy - hx since childhood    Patient Measurements: Height: 5' 1.81" (157 cm) Weight: 142 lb 14.4 oz (64.819 kg) IBW/kg (Calculated) : 54.17   Vital Signs: Temp: 97.8 F (36.6 C) (04/28 0705) Temp src: Oral (04/28 0705) BP: 119/82 mmHg (04/28 0705) Pulse Rate: 95 (04/28 0705) Intake/Output from previous day: 04/27 0701 - 04/28 0700 In: 1291.7 [I.V.:991.7; IV Piggyback:300] Out: -   Labs:  Recent Labs  07/03/12 0542 07/04/12 0510 07/05/12 0418  WBC 12.0* 11.3* 9.0  HGB 8.7* 9.4* 9.4*  PLT 297 363 384  CREATININE 0.53  --   --    Estimated Creatinine Clearance: 74.3 ml/min (by C-G formula based on Cr of 0.53).  97 ml/min/1.45m2 (normalized)  Microbiology: Recent Results (from the past 720 hour(s))  CULTURE, BLOOD (ROUTINE X 2)     Status: None   Collection Time    06/24/12  4:05 AM      Result Value Range Status   Specimen Description BLOOD LEFT ARM   Final   Special Requests BOTTLES DRAWN AEROBIC AND ANAEROBIC 4CC   Final   Culture  Setup Time 06/24/2012 08:30   Final   Culture     Final   Value: GRAM NEGATIVE RODS     ENTEROCOCCUS SPECIES     Note: Gram Stain Report Called to,Read Back By and Verified With: MONIQUE HOWLETT ON 06/24/2012 AT 11:16P BY WILEJ   Report Status 06/27/2012 FINAL   Final  CULTURE, BLOOD (ROUTINE X 2)     Status: None   Collection Time    06/24/12  4:05 AM      Result Value Range Status   Specimen Description BLOOD RIGHT ANTECUBITAL   Final   Special Requests BOTTLES DRAWN AEROBIC AND ANAEROBIC 5CC   Final   Culture  Setup Time 06/24/2012 08:30   Final   Culture     Final   Value: ENTEROCOCCUS SPECIES     Note: COMBINATION THERAPY OF HIGH DOSE AMPICILLIN OR VANCOMYCIN, PLUS AN AMINOGLYCOSIDE, IS USUALLY INDICATED FOR SERIOUS  ENTEROCOCCAL INFECTIONS.     KLEBSIELLA PNEUMONIAE     Note: Gram Stain Report Called to,Read Back By and Verified With: MONIQUE HOWLETT ON 06/24/2012 AT 11:16P   Report Status 06/27/2012 FINAL   Final   Organism ID, Bacteria ENTEROCOCCUS SPECIES   Final   Organism ID, Bacteria KLEBSIELLA PNEUMONIAE   Final  MRSA PCR SCREENING     Status: None   Collection Time    06/24/12  4:52 AM      Result Value Range Status   MRSA by PCR NEGATIVE  NEGATIVE Final   Comment:            The GeneXpert MRSA Assay (FDA     approved for NASAL specimens     only), is one component of a     comprehensive MRSA colonization     surveillance program. It is not     intended to diagnose MRSA     infection nor to guide or     monitor treatment for     MRSA infections.  CULTURE, BLOOD (ROUTINE X 2)     Status: None   Collection Time    06/27/12  1:01 PM      Result Value Range Status  Specimen Description BLOOD LEFT ARM   Final   Special Requests BOTTLES DRAWN AEROBIC ONLY 1CC   Final   Culture  Setup Time 06/27/2012 17:11   Final   Culture NO GROWTH 5 DAYS   Final   Report Status 07/03/2012 FINAL   Final  CULTURE, BLOOD (ROUTINE X 2)     Status: None   Collection Time    06/27/12  1:21 PM      Result Value Range Status   Specimen Description BLOOD LEFT HAND   Final   Special Requests BOTTLES DRAWN AEROBIC ONLY 1CC   Final   Culture  Setup Time 06/27/2012 17:10   Final   Culture NO GROWTH 5 DAYS   Final   Report Status 07/03/2012 FINAL   Final  URINE CULTURE     Status: None   Collection Time    06/29/12  6:20 AM      Result Value Range Status   Specimen Description URINE, RANDOM   Final   Special Requests Immunocompromised   Final   Culture  Setup Time 06/29/2012 08:38   Final   Colony Count NO GROWTH   Final   Culture NO GROWTH   Final   Report Status 06/30/2012 FINAL   Final  CLOSTRIDIUM DIFFICILE BY PCR     Status: None   Collection Time    06/29/12  4:45 PM      Result Value Range Status    C difficile by pcr NEGATIVE  NEGATIVE Final   Medications:  Anti-infectives   Start     Dose/Rate Route Frequency Ordered Stop   06/28/12 1630  imipenem-cilastatin (PRIMAXIN) 500 mg in sodium chloride 0.9 % 100 mL IVPB     500 mg 200 mL/hr over 30 Minutes Intravenous Every 8 hours 06/28/12 1617     06/28/12 0800  imipenem-cilastatin (PRIMAXIN) 500 mg in sodium chloride 0.9 % 100 mL IVPB  Status:  Discontinued     500 mg 200 mL/hr over 30 Minutes Intravenous Every 8 hours 06/28/12 0718 06/28/12 1030   06/27/12 1400  metroNIDAZOLE (FLAGYL) tablet 500 mg  Status:  Discontinued     500 mg Oral 3 times per day 06/27/12 1032 06/27/12 1229   06/26/12 2000  vancomycin (VANCOCIN) IVPB 1000 mg/200 mL premix  Status:  Discontinued     1,000 mg 200 mL/hr over 60 Minutes Intravenous Every 8 hours 06/26/12 1200 06/27/12 1229   06/26/12 1215  vancomycin (VANCOCIN) IVPB 1000 mg/200 mL premix     1,000 mg 200 mL/hr over 60 Minutes Intravenous  Once 06/26/12 1203 06/26/12 1315   06/25/12 0800  levofloxacin (LEVAQUIN) IVPB 750 mg  Status:  Discontinued     750 mg 100 mL/hr over 90 Minutes Intravenous Every 24 hours 06/24/12 1014 06/27/12 1039   06/25/12 0700  clindamycin (CLEOCIN) IVPB 900 mg  Status:  Discontinued     900 mg 100 mL/hr over 30 Minutes Intravenous  Once 06/25/12 0912 06/27/12 1205   06/24/12 2200  vancomycin (VANCOCIN) IVPB 750 mg/150 ml premix  Status:  Discontinued     750 mg 150 mL/hr over 60 Minutes Intravenous Every 12 hours 06/24/12 1014 06/26/12 1200   06/24/12 1400  aztreonam (AZACTAM) 1 g in dextrose 5 % 50 mL IVPB  Status:  Discontinued     1 g 100 mL/hr over 30 Minutes Intravenous 3 times per day 06/24/12 1014 06/27/12 1229   06/24/12 0415  levofloxacin (LEVAQUIN) IVPB 750 mg  750 mg 100 mL/hr over 90 Minutes Intravenous  Once 06/24/12 0400 06/24/12 0557   06/24/12 0415  vancomycin (VANCOCIN) IVPB 1000 mg/200 mL premix     1,000 mg 200 mL/hr over 60 Minutes  Intravenous  Once 06/24/12 0400 06/24/12 0654   06/24/12 0415  aztreonam (AZACTAM) 2 g in dextrose 5 % 50 mL IVPB     2 g 100 mL/hr over 30 Minutes Intravenous  Once 06/24/12 0409 06/24/12 1610      Assessment: 62 yo M with enterococcus/klebsiella bacteremia from 4/17 blood cultures.  Patient was on Vanco/Levaquin/Aztreonam from 4/17-4/20.  Cultures sensitive to ampicillin, however patient has a PCN and Ancef allergy listed.    Day #8 Primaxin  WBC continue to improve  SCr is stable, CrCl ~ 100 ml/min normalized.  Repeat blood cultures 4/20 are negative  Goal of Therapy:  Primaxin per weight and renal function   Plan:   Continue Primaxin 500 mg IV q8h  Follow up renal function and cultures as available.  Loralee Pacas, PharmD, BCPS Pager: [phone]  07/05/2012 10:08 AM

## 2012-07-05 NOTE — Progress Notes (Signed)
10 Days Post-Op  Subjective: Still complaining of abdominal pain and nausea  Objective: Vital signs in last 24 hours: Temp:  [97.8 F (36.6 C)-98.2 F (36.8 C)] 97.8 F (36.6 C) (04/28 0705) Pulse Rate:  [82-99] 95 (04/28 0705) Resp:  [16-18] 18 (04/28 0705) BP: (94-120)/(47-84) 119/82 mmHg (04/28 0705) SpO2:  [93 %-100 %] 95 % (04/28 0705) Weight:  [142 lb 14.4 oz (64.819 kg)] 142 lb 14.4 oz (64.819 kg) (04/28 0705) Last BM Date: 07/04/12  Intake/Output from previous day: 04/27 0701 - 04/28 0700 In: 1291.7 [I.V.:991.7; IV Piggyback:300] Out: -  Intake/Output this shift:    PE: General- In NAD Abdomen-soft, no significant tenderness, no mass  Lab Results:   Recent Labs  07/04/12 0510 07/05/12 0418  WBC 11.3* 9.0  HGB 9.4* 9.4*  HCT 27.3* 27.2*  PLT 363 384   BMET  Recent Labs  07/03/12 0542 07/04/12 0510  NA 135  --   K 3.3* 3.3*  CL 101  --   CO2 24  --   GLUCOSE 99  --   BUN 15  --   CREATININE 0.53  --   CALCIUM 7.4*  --    PT/INR No results found for this basename: LABPROT, INR,  in the last 72 hours Comprehensive Metabolic Panel:    Component Value Date/Time   NA 135 07/03/2012 0542   K 3.3* 07/04/2012 0510   CL 101 07/03/2012 0542   CO2 24 07/03/2012 0542   BUN 15 07/03/2012 0542   CREATININE 0.53 07/03/2012 0542   GLUCOSE 99 07/03/2012 0542   CALCIUM 7.4* 07/03/2012 0542   AST 53* 07/01/2012 0528   ALT 43 07/01/2012 0528   ALKPHOS 76 07/01/2012 0528   BILITOT 0.9 07/01/2012 0528   PROT 5.0* 07/01/2012 0528   ALBUMIN 1.9* 07/01/2012 0528     Studies/Results: US Abdomen Complete  07/03/2012  *RADIOLOGY REPORT*  Clinical Data:  62 year old male with abdominal pain.  History of pancreatitis and hepatitis.  ABDOMINAL ULTRASOUND COMPLETE  Comparison:  None.  Findings:  Gallbladder:   Multiple mobile gallstones are noted, the largest measuring 1.8 cm.  There is no evidence of gallbladder wall thickening, pericholecystic fluid or sonographic Murphy's  sign.  Common Bile Duct: Mild intrahepatic and extrahepatic biliary prominence is noted.  The CBD measures 13 mm in greatest diameter.  Liver:  The liver is within normal limits in parenchymal echogenicity. No focal abnormalities are identified.  IVC:  The IVC is not well visualized.  Pancreas: The pancreas is not well visualized secondary to overlying bowel gas.  Spleen:  Within normal limits in size and echotexture.  Right kidney:  The right kidney is normal in size and parenchymal echogenicity.  There is no evidence of solid mass, hydronephrosis or definite renal calculi.  The right kidney measures 12.6 cm.  Left kidney:  The left kidney is normal in size and parenchymal echogenicity.  There is no evidence of solid mass, hydronephrosis or definite renal calculi.   The left kidney measures 10.0 cm.  Abdominal Aorta:   The abdominal aorta is not well visualized secondary to overlying bowel gas.  A small amount of perihepatic ascites is noted as well as a right pleural effusion.  IMPRESSION: Cholelithiasis without evidence of acute cholecystitis.  Mild intrahepatic and extrahepatic biliary dilatation without obstructing cause identified.  Small amount of ascites and right pleural effusion.  IVC, pancreas and abdominal aorta not well visualized.   Original Report Authenticated By: Harmon Pier, M.D.  Anti-infectives: Anti-infectives   Start     Dose/Rate Route Frequency Ordered Stop   06/28/12 1630  imipenem-cilastatin (PRIMAXIN) 500 mg in sodium chloride 0.9 % 100 mL IVPB     500 mg 200 mL/hr over 30 Minutes Intravenous Every 8 hours 06/28/12 1617     06/28/12 0800  imipenem-cilastatin (PRIMAXIN) 500 mg in sodium chloride 0.9 % 100 mL IVPB  Status:  Discontinued     500 mg 200 mL/hr over 30 Minutes Intravenous Every 8 hours 06/28/12 0718 06/28/12 1030   06/27/12 1400  metroNIDAZOLE (FLAGYL) tablet 500 mg  Status:  Discontinued     500 mg Oral 3 times per day 06/27/12 1032 06/27/12 1229   06/26/12 2000   vancomycin (VANCOCIN) IVPB 1000 mg/200 mL premix  Status:  Discontinued     1,000 mg 200 mL/hr over 60 Minutes Intravenous Every 8 hours 06/26/12 1200 06/27/12 1229   06/26/12 1215  vancomycin (VANCOCIN) IVPB 1000 mg/200 mL premix     1,000 mg 200 mL/hr over 60 Minutes Intravenous  Once 06/26/12 1203 06/26/12 1315   06/25/12 0800  levofloxacin (LEVAQUIN) IVPB 750 mg  Status:  Discontinued     750 mg 100 mL/hr over 90 Minutes Intravenous Every 24 hours 06/24/12 1014 06/27/12 1039   06/25/12 0700  clindamycin (CLEOCIN) IVPB 900 mg  Status:  Discontinued     900 mg 100 mL/hr over 30 Minutes Intravenous  Once 06/25/12 0912 06/27/12 1205   06/24/12 2200  vancomycin (VANCOCIN) IVPB 750 mg/150 ml premix  Status:  Discontinued     750 mg 150 mL/hr over 60 Minutes Intravenous Every 12 hours 06/24/12 1014 06/26/12 1200   06/24/12 1400  aztreonam (AZACTAM) 1 g in dextrose 5 % 50 mL IVPB  Status:  Discontinued     1 g 100 mL/hr over 30 Minutes Intravenous 3 times per day 06/24/12 1014 06/27/12 1229   06/24/12 0415  levofloxacin (LEVAQUIN) IVPB 750 mg     750 mg 100 mL/hr over 90 Minutes Intravenous  Once 06/24/12 0400 06/24/12 0557   06/24/12 0415  vancomycin (VANCOCIN) IVPB 1000 mg/200 mL premix     1,000 mg 200 mL/hr over 60 Minutes Intravenous  Once 06/24/12 0400 06/24/12 0654   06/24/12 0415  aztreonam (AZACTAM) 2 g in dextrose 5 % 50 mL IVPB     2 g 100 mL/hr over 30 Minutes Intravenous  Once 06/24/12 0409 06/24/12 9604      Assessment Principal Problem:  Acute necrotizing pancreatitis-seems to be improving; US shows gallstones and mild biliary dilatation so gallstone etiology likely   LOS: 12 days   Plan: Would need repeat imaging to evaluate for resolution, abscess or pseudocyst formation.  If imaging shows improvement, could start to advance to low fat diet.  Would repeat imaging again in 6 weeks, and if no pseudocyst formation, would need cholecystectomy at that time.     Jonda Alanis,  Elijio Staples C. 07/05/2012

## 2012-07-05 NOTE — Progress Notes (Signed)
Attempt to restart PIV x2. IV team paged as current site is out of date and Radiology needs 20g to give IV contrast.

## 2012-07-05 NOTE — Progress Notes (Addendum)
[facility] PROGRESS NOTE  Steven [name] ZOX:[id number] DOB: 03-09-1951 DOA: 06/23/2012 PCP: Lehman Prom, NP  Assessment/Plan:                                                                                                               1. Necrotizing pancreatitis - fever resolved today - Leukocytosis normalized today - surgery following for potential debridement, no surgical indication yet for that - US abdomen show gallstones which may suggest an etiology; chole? Per surgery; appreciate input regarding timing if this is to happen during this hospitalization since patient is on Plavix. If no acute indications for surgery, plan for SNF pending MRCP.   Left femoral intertrochanteric fracture -  - Underwent ORIF of the left femur on 4/18. PT/OT evaluation, plan for SNF placement. Pain control.  Sinus tachycardia - May be pain, related due to dehydration or most likely due to #1.  CT angiogram of the chest to rule out PE was negative. Free t4 and TSH within normal limits. Echocardiogram showed good LVEF without any wall abn. His tachycardia improved and he is b blockers. Stable, discontinue telemetry History of CVA with left-sided hemiparesis - initially plavix held  For surgery of the left hip. Back on Plavix, may need to be stopped again if planning chole.  Dementia - no acute issues. Chronic left humerus fracture. no acute issues.  Polymicrobial bacteremia/Leukocytosis - C diff negative. patient is afebrile. Chest x-ray and UA are unremarkable except the chest x-ray showing mild congestion. He was found to have polymicrobial bacteremia with Positive Blood cultures showing enterococcus and klebsiella. ID consulted for further recommendations. CT abd and pelvis was done and showed necrotizing pancreatitis. Started him on primaxin. - ID following, appreciate input - continue antibiotics, appreciate ID input regarding total length.   Hypokalemia; replete as needed  DVT prophylaxis on  lovenox.   Code Status: full code Family Communication: none at bedside Disposition Plan: SNF when medically stable  PROCEDURE: Open reduction and internal fixation of the left comminuted intertrochanteric femur fracture on 4/18  Consultants:  Cardiology for tachycardic  Orthopedics   ID Dr Orvan Falconer.  GI Dr. Arlyce Dice  HPI/Subjective: - feels bad this morning, cannot elaborate why  Objective: Filed Vitals:   07/05/12 0000 07/05/12 0217 07/05/12 0400 07/05/12 0705  BP:  120/84  119/82  Pulse:  82  95  Temp:  98.2 F (36.8 C)  97.8 F (36.6 C)  TempSrc:  Oral  Oral  Resp: 18 18 18 18   Height:      Weight:    64.819 kg (142 lb 14.4 oz)  SpO2: 96% 93% 94% 95%    Intake/Output Summary (Last 24 hours) at 07/05/12 0821 Last data filed at 07/05/12 0553  Gross per 24 hour  Intake 1291.67 ml  Output      0 ml  Net 1291.67 ml   Filed Weights   06/24/12 0837 07/05/12 0705  Weight: 64.411 kg (142 lb) 64.819 kg (142 lb 14.4 oz)    Exam: General: Well-developed well-nourished.  Cardiovascular:  S1-S2 heard tachycardic.  Respiratory: No rhonchi or crepitations.  Abdomen: Soft with mild tenderness bowel sounds present.   Musculoskeletal: no edema Neurologic: Has left-sided weakness from old stroke  Data Reviewed: Basic Metabolic Panel:  Recent Labs Lab 06/28/12 0855  06/29/12 0450 06/30/12 0510 07/01/12 0528 07/02/12 0530 07/03/12 0542 07/04/12 0510  NA 133*  < > 134* 136 138 135 135  --   K 2.9*  < > 3.1* 2.9* 3.1* 3.2* 3.3* 3.3*  CL 102  < > 101 105 105 101 101  --   CO2 25  < > 23 22 24 26 24   --   GLUCOSE 108*  < > 124* 130* 103* 106* 99  --   BUN 16  < > 18 21 17 16 15   --   CREATININE 0.62  < > 0.60 0.56 0.59 0.63 0.53  --   CALCIUM 7.5*  < > 7.6* 7.5* 7.6* 7.5* 7.4*  --   MG 2.3  --  2.4  --   --   --   --  1.8  < > = values in this interval not displayed. Liver Function Tests:  Recent Labs Lab 06/28/12 1740 06/30/12 0510 07/01/12 0528  AST 34 28  53*  ALT 41 33 43  ALKPHOS 69 70 76  BILITOT 0.6 0.8 0.9  PROT 5.2* 5.0* 5.0*  ALBUMIN 1.9* 1.9* 1.9*    Recent Labs Lab 06/28/12 1740 07/04/12 0510  LIPASE 22 38  AMYLASE 53  --    CBC:  Recent Labs Lab 07/01/12 0528 07/02/12 0530 07/03/12 0542 07/04/12 0510 07/05/12 0418  WBC 22.9* 22.5* 12.0* 11.3* 9.0  HGB 10.1* 9.7* 8.7* 9.4* 9.4*  HCT 29.8* 28.8* 25.5* 27.3* 27.2*  MCV 83.7 83.2 84.2 82.7 82.7  PLT 259 293 297 363 384   BNP (last 3 results)  Recent Labs  06/24/12 0405 06/24/12 0551  PROBNP 1874.0* 1830.0*   Recent Results (from the past 240 hour(s))  CULTURE, BLOOD (ROUTINE X 2)     Status: None   Collection Time    06/27/12  1:01 PM      Result Value Range Status   Specimen Description BLOOD LEFT ARM   Final   Special Requests BOTTLES DRAWN AEROBIC ONLY 1CC   Final   Culture  Setup Time 06/27/2012 17:11   Final   Culture NO GROWTH 5 DAYS   Final   Report Status 07/03/2012 FINAL   Final  CULTURE, BLOOD (ROUTINE X 2)     Status: None   Collection Time    06/27/12  1:21 PM      Result Value Range Status   Specimen Description BLOOD LEFT HAND   Final   Special Requests BOTTLES DRAWN AEROBIC ONLY 1CC   Final   Culture  Setup Time 06/27/2012 17:10   Final   Culture NO GROWTH 5 DAYS   Final   Report Status 07/03/2012 FINAL   Final  URINE CULTURE     Status: None   Collection Time    06/29/12  6:20 AM      Result Value Range Status   Specimen Description URINE, RANDOM   Final   Special Requests Immunocompromised   Final   Culture  Setup Time 06/29/2012 08:38   Final   Colony Count NO GROWTH   Final   Culture NO GROWTH   Final   Report Status 06/30/2012 FINAL   Final  CLOSTRIDIUM DIFFICILE BY PCR     Status: None  Collection Time    06/29/12  4:45 PM      Result Value Range Status   C difficile by pcr NEGATIVE  NEGATIVE Final     Studies: US Abdomen Complete  07/03/2012  *RADIOLOGY REPORT*  Clinical Data:  62 year old male with abdominal  pain.  History of pancreatitis and hepatitis.  ABDOMINAL ULTRASOUND COMPLETE  Comparison:  None.  Findings:  Gallbladder:   Multiple mobile gallstones are noted, the largest measuring 1.8 cm.  There is no evidence of gallbladder wall thickening, pericholecystic fluid or sonographic Murphy's sign.  Common Bile Duct: Mild intrahepatic and extrahepatic biliary prominence is noted.  The CBD measures 13 mm in greatest diameter.  Liver:  The liver is within normal limits in parenchymal echogenicity. No focal abnormalities are identified.  IVC:  The IVC is not well visualized.  Pancreas: The pancreas is not well visualized secondary to overlying bowel gas.  Spleen:  Within normal limits in size and echotexture.  Right kidney:  The right kidney is normal in size and parenchymal echogenicity.  There is no evidence of solid mass, hydronephrosis or definite renal calculi.  The right kidney measures 12.6 cm.  Left kidney:  The left kidney is normal in size and parenchymal echogenicity.  There is no evidence of solid mass, hydronephrosis or definite renal calculi.   The left kidney measures 10.0 cm.  Abdominal Aorta:   The abdominal aorta is not well visualized secondary to overlying bowel gas.  A small amount of perihepatic ascites is noted as well as a right pleural effusion.  IMPRESSION: Cholelithiasis without evidence of acute cholecystitis.  Mild intrahepatic and extrahepatic biliary dilatation without obstructing cause identified.  Small amount of ascites and right pleural effusion.  IVC, pancreas and abdominal aorta not well visualized.   Original Report Authenticated By: Harmon Pier, M.D.     Scheduled Meds: . acetaminophen  1,000 mg Oral TID  . cholecalciferol  1,000 Units Oral q morning - 10a  . clopidogrel  75 mg Oral Q breakfast  . enoxaparin (LOVENOX) injection  40 mg Subcutaneous Q24H  . imipenem-cilastatin  500 mg Intravenous Q8H  . lip balm  1 application Topical BID  . metoprolol tartrate  50 mg Oral  BID  . multivitamin with minerals  1 tablet Oral q morning - 10a  . pantoprazole  40 mg Oral Daily  . potassium chloride  20 mEq Oral Daily  . psyllium  1 packet Oral BID  . saccharomyces boulardii  250 mg Oral BID  . tamsulosin  0.4 mg Oral QHS   Continuous Infusions: . lactated ringers 50 mL/hr at 07/05/12 1610    Principal Problem:   Closed left hip fracture Active Problems:   CVA   Dementia   Fracture of left humerus   Leucocytosis   Bacteremia due to Enterococcus   Bacteremia due to Klebsiella pneumoniae   Abnormal finding on GI tract imaging   Pancreatitis, acute   Time spent: 15 min  Pamella Pert  [facility] Pager [phone] If 7PM-7AM, please contact night-coverage at [email or web address], password [facility] 07/05/2012, 8:21 AM  LOS: 12 days

## 2012-07-05 NOTE — Progress Notes (Signed)
Patient ID: Steven [name], male   DOB: November 01, 1950, 62 y.o.   MRN: [id number]         [facility]    Date of Admission:  06/23/2012   Total days of antibiotics 13        Day 8 imipenem         Principal Problem:   Closed left hip fracture Active Problems:   CVA   Dementia   Fracture of left humerus   Leucocytosis   Bacteremia due to Enterococcus   Bacteremia due to Klebsiella pneumoniae   Abnormal finding on GI tract imaging   Pancreatitis, acute   Dilated bile duct   . acetaminophen  1,000 mg Oral TID  . cholecalciferol  1,000 Units Oral q morning - 10a  . clopidogrel  75 mg Oral Q breakfast  . enoxaparin (LOVENOX) injection  40 mg Subcutaneous Q24H  . imipenem-cilastatin  500 mg Intravenous Q8H  . lip balm  1 application Topical BID  . metoprolol tartrate  50 mg Oral BID  . multivitamin with minerals  1 tablet Oral q morning - 10a  . pantoprazole  40 mg Oral Daily  . potassium chloride  20 mEq Oral Daily  . psyllium  1 packet Oral BID  . saccharomyces boulardii  250 mg Oral BID  . tamsulosin  0.4 mg Oral QHS    Subjective: He denies pain.  Objective: Temp:  [97.8 F (36.6 C)-98.2 F (36.8 C)] 97.9 F (36.6 C) (04/28 1426) Pulse Rate:  [82-98] 98 (04/28 1426) Resp:  [16-18] 18 (04/28 1426) BP: (97-120)/(47-84) 100/73 mmHg (04/28 1426) SpO2:  [93 %-98 %] 95 % (04/28 1426) Weight:  [64.819 kg (142 lb 14.4 oz)] 64.819 kg (142 lb 14.4 oz) (04/28 0705)  General: Slightly agitated Abdomen: Mild diffuse tenderness  Lab Results Lab Results  Component Value Date   WBC 9.0 07/05/2012   HGB 9.4* 07/05/2012   HCT 27.2* 07/05/2012   MCV 82.7 07/05/2012   PLT 384 07/05/2012    Assessment: His repeat blood cultures have become negative on therapy for polymicrobial bacteremia related to pancreatitis.  Plan: 1. Continue imipenem 2. Repeat abdominal CT scan is pending  Cliffton Asters, MD [facility] [phone] pager   [phone] cell 07/05/2012, 5:12 PM

## 2012-07-05 NOTE — Progress Notes (Signed)
[facility] Progress Note  SUBJECTIVE: He feels "like hell"  OBJECTIVE:  Vital signs in last 24 hours: Temp:  [97.8 F (36.6 C)-98.2 F (36.8 C)] 97.8 F (36.6 C) (04/28 0705) Pulse Rate:  [82-128] 95 (04/28 0705) Resp:  [16-29] 18 (04/28 0705) BP: (94-120)/(47-84) 119/82 mmHg (04/28 0705) SpO2:  [93 %-100 %] 95 % (04/28 0705) Weight:  [142 lb 14.4 oz (64.819 kg)] 142 lb 14.4 oz (64.819 kg) (04/28 0705) Last BM Date: 07/04/12 General:    white male in NAD Abdomen:  Soft, nondistended, mild upper abdominal tenderness. . Normal bowel sounds. Extremities:  Without edema. Neurologic:  Alert, restless, some confusion   Lab Results:  Recent Labs  07/03/12 0542 07/04/12 0510 07/05/12 0418  WBC 12.0* 11.3* 9.0  HGB 8.7* 9.4* 9.4*  HCT 25.5* 27.3* 27.2*  PLT 297 363 384   BMET  Recent Labs  07/03/12 0542 07/04/12 0510  NA 135  --   K 3.3* 3.3*  CL 101  --   CO2 24  --   GLUCOSE 99  --   BUN 15  --   CREATININE 0.53  --   CALCIUM 7.4*  --     Studies/Results: US Abdomen Complete  07/03/2012  *RADIOLOGY REPORT*  Clinical Data:  62 year old male with abdominal pain.  History of pancreatitis and hepatitis.  ABDOMINAL ULTRASOUND COMPLETE  Comparison:  None.  Findings:  Gallbladder:   Multiple mobile gallstones are noted, the largest measuring 1.8 cm.  There is no evidence of gallbladder wall thickening, pericholecystic fluid or sonographic Murphy's sign.  Common Bile Duct: Mild intrahepatic and extrahepatic biliary prominence is noted.  The CBD measures 13 mm in greatest diameter.  Liver:  The liver is within normal limits in parenchymal echogenicity. No focal abnormalities are identified.  IVC:  The IVC is not well visualized.  Pancreas: The pancreas is not well visualized secondary to overlying bowel gas.  Spleen:  Within normal limits in size and echotexture.  Right kidney:  The right kidney is normal in size and parenchymal echogenicity.  There is no evidence  of solid mass, hydronephrosis or definite renal calculi.  The right kidney measures 12.6 cm.  Left kidney:  The left kidney is normal in size and parenchymal echogenicity.  There is no evidence of solid mass, hydronephrosis or definite renal calculi.   The left kidney measures 10.0 cm.  Abdominal Aorta:   The abdominal aorta is not well visualized secondary to overlying bowel gas.  A small amount of perihepatic ascites is noted as well as a right pleural effusion.  IMPRESSION: Cholelithiasis without evidence of acute cholecystitis.  Mild intrahepatic and extrahepatic biliary dilatation without obstructing cause identified.  Small amount of ascites and right pleural effusion.  IVC, pancreas and abdominal aorta not well visualized.   Original Report Authenticated By: Harmon Pier, M.D.      ASSESSMENT / PLAN:  1. Bacteremia / abnormal abdominal imagine. CTscan suggests acute pancreatitis with probable necrosis. Inially there was concern for possible penetrating ulcer but UGI series only showed marked edema of second and third portions of duodenum without ulcer. Primary problem does seem to be pancreatitis. Etiology? Possibly biliary. LFTs normal but ultrasound does show multiple gallstones and CBD of 13mm. Surgery following.  WBC now normal,  on Primaxin. For MRI / MRCP today for reassessment of pancreas but doubtful patient will be able to stay still / hold breath. We may have to repeat CTscan instead. Patient currently on clear liquids with ice  cream.  2. Dementia. He is restless in bed, somewhat agitated.   LOS: 12 days   Willette Cluster  07/05/2012, 8:58 AM   [facility] Attending  I have also seen and assessed the patient in person and agree with the above note. Will do CT abdomen w/ pancreatic protocol to follow-up.  I wonder about a malignancy or undiagnosed ulcer.   Iva Boop, MD, [facility] [phone] (pager) 07/05/2012 12:37 PM

## 2012-07-05 NOTE — Progress Notes (Signed)
Patient ID: Steven [name], male   DOB: 01-Dec-1950, 62 y.o.   MRN: [id number] Subjective: 10 Days Post-Op Procedure(s) (LRB): INTRAMEDULLARY (IM) NAIL FEMORAL (Left)    Patient at baseline confusion.  No Orthopaedic issues reported or noted  Objective:   VITALS:   Filed Vitals:   07/05/12 0800  BP:   Pulse:   Temp:   Resp: 18    Incision: healed, no drainage  LABS  Recent Labs  07/03/12 0542 07/04/12 0510 07/05/12 0418  HGB 8.7* 9.4* 9.4*  HCT 25.5* 27.3* 27.2*  WBC 12.0* 11.3* 9.0  PLT 297 363 384     Recent Labs  07/03/12 0542 07/04/12 0510  NA 135  --   K 3.3* 3.3*  BUN 15  --   CREATININE 0.53  --   GLUCOSE 99  --     No results found for this basename: LABPT, INR,  in the last 72 hours   Assessment/Plan: 10 Days Post-Op Procedure(s) (LRB): INTRAMEDULLARY (IM) NAIL FEMORAL (Left)   Up with therapy, PWB LLE as ordered  To SNF per medicine once medically stable RTC now in 4 weeks for X-rays after discharge

## 2012-07-06 ENCOUNTER — Inpatient Hospital Stay (HOSPITAL_COMMUNITY): Payer: Medicaid Other

## 2012-07-06 DIAGNOSIS — K8689 Other specified diseases of pancreas: Secondary | ICD-10-CM | POA: Clinically undetermined

## 2012-07-06 LAB — COMPREHENSIVE METABOLIC PANEL
AST: 28 U/L (ref 0–37)
Albumin: 1.9 g/dL — ABNORMAL LOW (ref 3.5–5.2)
Calcium: 7.7 mg/dL — ABNORMAL LOW (ref 8.4–10.5)
Creatinine, Ser: 0.57 mg/dL (ref 0.50–1.35)
Sodium: 133 mEq/L — ABNORMAL LOW (ref 135–145)
Total Protein: 5.2 g/dL — ABNORMAL LOW (ref 6.0–8.3)

## 2012-07-06 LAB — CBC
MCH: 28.1 pg (ref 26.0–34.0)
MCHC: 33.8 g/dL (ref 30.0–36.0)
Platelets: 373 10*3/uL (ref 150–400)

## 2012-07-06 LAB — MAGNESIUM: Magnesium: 1.9 mg/dL (ref 1.5–2.5)

## 2012-07-06 MED ORDER — SODIUM CHLORIDE 0.9 % IJ SOLN
10.0000 mL | INTRAMUSCULAR | Status: DC | PRN
  Administered 2012-07-07: 10 mL
  Administered 2012-07-15: 20 mL

## 2012-07-06 MED ORDER — ENOXAPARIN SODIUM 80 MG/0.8ML ~~LOC~~ SOLN
70.0000 mg | Freq: Two times a day (BID) | SUBCUTANEOUS | Status: DC
  Administered 2012-07-06 – 2012-07-09 (×6): 70 mg via SUBCUTANEOUS
  Filled 2012-07-06 (×9): qty 0.8

## 2012-07-06 NOTE — Progress Notes (Signed)
ATTENDING ADDENDUM:  I personally reviewed patient's record, examined the patient, and formulated the following assessment and plan:  CT shows necrotizing pancreatitis, which appears relatively stable from last CT.  No indications for surgery at this time, given wbc is normal and his vitals are stable.  Patient will probably not be able to tolerate anything more than clears for weeks.  Therefore, I believe TPN is appropriate.  Once to goal, can start to cycle to assist with his placement.  Agree with anticoagulation.  It is a small, non-obstructing clot.  Hopefully this will keep it from enlarging and occluding his splenic or portal veins.  If he gets worse, may need FNA but would like to avoid surgery for as long as possible.

## 2012-07-06 NOTE — Progress Notes (Signed)
11 Days Post-Op  Subjective: He's still confused, and complains of abdominal pain.  Objective: Vital signs in last 24 hours: Temp:  [97.5 F (36.4 C)-98.1 F (36.7 C)] 97.5 F (36.4 C) (04/29 0555) Pulse Rate:  [98-121] 99 (04/29 0555) Resp:  [18-20] 18 (04/29 0555) BP: (90-116)/(59-92) 116/92 mmHg (04/29 0555) SpO2:  [90 %-98 %] 94 % (04/29 0555) Weight:  [66.8 kg (147 lb 4.3 oz)] 66.8 kg (147 lb 4.3 oz) (04/29 0500) Last BM Date: 07/05/12 360 PO recorded yesterday, +BM,  Diet: clears Afebrile, VSS   K+ still low, WBC is 8.8  Intake/Output from previous day: 04/28 0701 - 04/29 0700 In: 1565.8 [P.O.:360; I.V.:1205.8] Out: 850 [Urine:850] Intake/Output this shift: Total I/O In: 60 [P.O.:60] Out: -   General appearance: alert, cooperative, no distress and pt remains confused. GI: soft, he's tender RUQ, and it depends on when you ask he is tender in other places.  Lab Results:   Recent Labs  07/05/12 0418 07/06/12 0440  WBC 9.0 8.8  HGB 9.4* 9.0*  HCT 27.2* 26.6*  PLT 384 373    BMET  Recent Labs  07/04/12 0510 07/06/12 0440  NA  --  133*  K 3.3* 3.4*  CL  --  99  CO2  --  26  GLUCOSE  --  89  BUN  --  7  CREATININE  --  0.57  CALCIUM  --  7.7*   PT/INR No results found for this basename: LABPROT, INR,  in the last 72 hours   Recent Labs Lab 06/30/12 0510 07/01/12 0528 07/06/12 0440  AST 28 53* 28  ALT 33 43 24  ALKPHOS 70 76 176*  BILITOT 0.8 0.9 0.7  PROT 5.0* 5.0* 5.2*  ALBUMIN 1.9* 1.9* 1.9*     Lipase     Component Value Date/Time   LIPASE 38 07/04/2012 0510     Studies/Results: Ct Abd Wo & W Cm  07/05/2012  *RADIOLOGY REPORT*  Clinical Data: Follow-up of abnormal pancreas.  Query mass versus pancreatitis.  CT ABDOMEN WITHOUT AND WITH CONTRAST  Technique:  Multidetector CT imaging of the abdomen was performed following the standard protocol before and during bolus administration of intravenous contrast. Contrast enhanced images are  obtained during the arterial, portal, and delayed phases.  Contrast: OMNIPAQUE IOHEXOL 350 MG/ML SOLN  Comparison: CT abdomen and pelvis 06/27/2012.  Findings: Bilateral pleural effusions with basilar atelectasis. Coronary artery calcifications.  Unenhanced images of the abdomen demonstrate diffuse infiltration around the pancreas consistent with changes of acute pancreatitis. There are small calcifications in the region of the head of the pancreas and vascular calcifications are present.  There is increased density demonstrated in the splenic vein suggesting venous thrombosis.  Arterial and portal phase images demonstrate heterogeneous enhancement of the pancreas with hypo enhancing pancreatic tail consistent with pancreatic necrosis.  There is hypo enhancement of the head of the pancreas with heterogeneous gas collections consistent with pancreatic necrosis or abscess.  Infiltration or edema in the peripancreatic fat with a developing heterogeneous fluid and tissue density in the region of the tail the pancreas and splenic hilum.  No discrete pseudocyst is demonstrated. No discrete pancreatic mass is identified, although masses could be obscured by the inflammatory process.  The celiac axis, superior mesenteric artery, and splenic artery appear patent.  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 Focal thrombus demonstrated in the mesenteric vein.  Portal veins are patent.  There is edema throughout the mesenteric fat.  The stomach  and duodenum are decompressed.  The liver and spleen are unremarkable.  There is minimal intra and extrahepatic bile duct dilatation but no filling defect is identified to suggest a stone. The gallbladder is contracted.  Kidneys are symmetrical with symmetrical nephrograms.  No hydronephrosis.  Calcification of the abdominal aorta.  Inferior vena cava is patent without distension. No free air in the abdomen.  IMPRESSION: Inflammatory changes and around the pancreas consistent  with acute pancreatitis.  Pancreatic necrosis demonstrated in the head and tail of the pancreas.  Gas collection in the head of the pancreas may be related pancreatic necrosis or abscess.  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  No discrete mass lesions identified.   Original Report Authenticated By: Burman Nieves, M.D.     Medications: . acetaminophen  1,000 mg Oral TID  . cholecalciferol  1,000 Units Oral q morning - 10a  . clopidogrel  75 mg Oral Q breakfast  . enoxaparin (LOVENOX) injection  40 mg Subcutaneous Q24H  . imipenem-cilastatin  500 mg Intravenous Q8H  . lip balm  1 application Topical BID  . metoprolol tartrate  50 mg Oral BID  . multivitamin with minerals  1 tablet Oral q morning - 10a  . pantoprazole  40 mg Oral Daily  . potassium chloride  20 mEq Oral Daily  . psyllium  1 packet Oral BID  . saccharomyces boulardii  250 mg Oral BID  . tamsulosin  0.4 mg Oral QHS    Assessment/Plan 1.  Acute necrotizing pancreatitis- US shows gallstones and mild biliary dilatation so gallstone etiology likely. Repeat CT still show acute pancreatitis, pancreatic necroisis at  The head and tail of the pancreas.necrosis or abscess.   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  2. Positive bacteremia of uncertain etiology. Been followed by ID.  3. 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  4. Significant dementia; currently resides at a skilled nursing facility.  5. Prior history of alcohol and substance abuse.  6. Follow with left hip ORIF 4/17 Dr. Charlann Boxer  7. Urinary incontinence/hypertonicity of the bladder.  8. Prior fracture of the left humerus, with history of multiple trauma/crush fracture.  9. Lumbar spondylitis  Plan:  Dr. Maisie Fus has reviewed the films from yesterday and it is her opinion he should be started on TNA, and just sips of clears, he's not taking much anyway.  Also consider heparin for Splenic and SMA  thrombosis.  Will defer to medicine.       LOS: 13 days    Steven [name] 07/06/2012

## 2012-07-06 NOTE — Progress Notes (Signed)
[facility] Progress Note  SUBJECTIVE: he feels "terrible". Feels restless  OBJECTIVE:  Vital signs in last 24 hours: Temp:  [97.5 F (36.4 C)-98.1 F (36.7 C)] 97.5 F (36.4 C) (04/29 0555) Pulse Rate:  [98-121] 99 (04/29 0555) Resp:  [18-20] 18 (04/29 0555) BP: (90-116)/(59-92) 116/92 mmHg (04/29 0555) SpO2:  [90 %-98 %] 94 % (04/29 0555) Weight:  [147 lb 4.3 oz (66.8 kg)] 147 lb 4.3 oz (66.8 kg) (04/29 0500) Last BM Date: 07/05/12 General:    white male in NAD Abdomen:  Soft,  Nondistended, mild mid abdominal tenderness (grimaces). Normal bowel sounds. Neurologic:  Alert and oriented,  grossly normal neurologically. Psych:  Cooperative. Normal mood and affect.   Lab Results:  Recent Labs  07/04/12 0510 07/05/12 0418 07/06/12 0440  WBC 11.3* 9.0 8.8  HGB 9.4* 9.4* 9.0*  HCT 27.3* 27.2* 26.6*  PLT 363 384 373   BMET  Recent Labs  07/04/12 0510 07/06/12 0440  NA  --  133*  K 3.3* 3.4*  CL  --  99  CO2  --  26  GLUCOSE  --  89  BUN  --  7  CREATININE  --  0.57  CALCIUM  --  7.7*   LFT  Recent Labs  07/06/12 0440  PROT 5.2*  ALBUMIN 1.9*  AST 28  ALT 24  ALKPHOS 176*  BILITOT 0.7    Studies/Results: Ct Abd Wo & W Cm  07/05/2012  *RADIOLOGY REPORT*  Clinical Data: Follow-up of abnormal pancreas.  Query mass versus pancreatitis.  CT ABDOMEN WITHOUT AND WITH CONTRAST  Technique:  Multidetector CT imaging of the abdomen was performed following the standard protocol before and during bolus administration of intravenous contrast. Contrast enhanced images are obtained during the arterial, portal, and delayed phases.  Contrast: OMNIPAQUE IOHEXOL 350 MG/ML SOLN  Comparison: CT abdomen and pelvis 06/27/2012.  Findings: Bilateral pleural effusions with basilar atelectasis. Coronary artery calcifications.  Unenhanced images of the abdomen demonstrate diffuse infiltration around the pancreas consistent with changes of acute pancreatitis. There are  small calcifications in the region of the head of the pancreas and vascular calcifications are present.  There is increased density demonstrated in the splenic vein suggesting venous thrombosis.  Arterial and portal phase images demonstrate heterogeneous enhancement of the pancreas with hypo enhancing pancreatic tail consistent with pancreatic necrosis.  There is hypo enhancement of the head of the pancreas with heterogeneous gas collections consistent with pancreatic necrosis or abscess.  Infiltration or edema in the peripancreatic fat with a developing heterogeneous fluid and tissue density in the region of the tail the pancreas and splenic hilum.  No discrete pseudocyst is demonstrated. No discrete pancreatic mass is identified, although masses could be obscured by the inflammatory process.  The celiac axis, superior mesenteric artery, and splenic artery appear patent.  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 Focal thrombus demonstrated in the mesenteric vein.  Portal veins are patent.  There is edema throughout the mesenteric fat.  The stomach and duodenum are decompressed.  The liver and spleen are unremarkable.  There is minimal intra and extrahepatic bile duct dilatation but no filling defect is identified to suggest a stone. The gallbladder is contracted.  Kidneys are symmetrical with symmetrical nephrograms.  No hydronephrosis.  Calcification of the abdominal aorta.  Inferior vena cava is patent without distension. No free air in the abdomen.  IMPRESSION: Inflammatory changes and around the pancreas consistent with acute pancreatitis.  Pancreatic necrosis demonstrated in the head  and tail of the pancreas.  Gas collection in the head of the pancreas may be related pancreatic necrosis or abscess.  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  No discrete mass lesions identified.   Original Report Authenticated By: Burman Nieves, M.D.      ASSESSMENT / PLAN:  1. Necrotizing  pancreatitis. Possibly biliary pancreatitis, LFTs normal but he does have gallstones and CBD of 13mm by ultrasound  Repeat CTscan yesterday reveals changes c/w acute pancreatitis with necrosis in head and tail, see full report above. WBC normal, on Primaxin.  Surgery following. Patient to start TNA.  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  Hospitalist to anticoagulate him.   3. Dementia. Patient restless, aware he is hospital but doesn't know which one or current year.     LOS: 13 days   Willette Cluster  07/06/2012, 9:20 AM  [facility] Attending  I have also seen and assessed the patient and agree with the above note.  1) Anticoagulation probably ok - suspect the thrombosis is from the pancreatitis. Not clear to me what the end point will be on anticoagualtion but again seems reasonable. 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

## 2012-07-06 NOTE — Progress Notes (Signed)
IV out dated and need new start for CT scan, successful insertion--IV restarted..... Tama Gander, RN

## 2012-07-06 NOTE — Progress Notes (Signed)
Physical Therapy Treatment Patient Details Name: Steven [name] MRN: [id number] DOB: 08-22-1950 Today's Date: 07/06/2012 Time: 1435-1500 PT Time Calculation (min): 25 min  PT Assessment / Plan / Recommendation Comments on Treatment Session  POD # 11 L ORIF 2nd fall/fx.  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  Sister in room and stated pt has not amb in about 2 years. RN called equipment to return RW that was delivered to room.  Assisted pt OOB to recliner.  Progressing with transfers slowly.  Pt plans to D/C to SNF if [facility] is unable to provide appropriate car.    Follow Up Recommendations  SNF     Does the patient have the potential to tolerate intense rehabilitation     Barriers to Discharge        Equipment Recommendations  None recommended by PT    Recommendations for Other Services    Frequency Min 3X/week   Plan Discharge plan remains appropriate    Precautions / Restrictions Precautions Precautions: Fall Precaution Comments: L hemiparesis s/p CVA and L chronic humerus fx Restrictions Weight Bearing Restrictions: Yes LLE Weight Bearing: Partial weight bearing LLE Partial Weight Bearing Percentage or Pounds: 50%   Pertinent Vitals/Pain C/o L hip pain during mavt but unable to rate.    Mobility  Bed Mobility Bed Mobility: Supine to Sit Supine to Sit: 1: +2 Total assist Details for Bed Mobility Assistance: HOB elevated 45' and use of pad to swival hips around  Transfers Transfers: Stand Pivot Transfers Sit to Stand: 1: +2 Total assist Sit to Stand: Patient Percentage: 40% Stand to Sit: 1: +2 Total assist Stand to Sit: Patient Percentage: 40% Stand Pivot Transfers: 1: +2 Total assist Stand Pivot Transfers: Patient Percentage: 10% Details for Transfer Assistance: Using "Bear Hug" standd pivot to pt's R from bed to recliner. Total assist+1 pt 40%     PT Goals                                                         progressing    Visit Information  Last PT Received On:  07/06/12 Assistance Needed: +2    Subjective Data      Cognition    impaired Pt not as verbal as he was before. Flat but cooperative   Balance   poor  End of Session PT - End of Session Equipment Utilized During Treatment: Gait belt Activity Tolerance: Patient tolerated treatment well Nurse Communication: Need for lift equipment;Mobility status   [name]  PTA [facility]  Acute  Rehab Pager      [phone]

## 2012-07-06 NOTE — Progress Notes (Signed)
Peripherally Inserted Central Catheter/Midline Placement  The IV Nurse has discussed with the patient and/or persons authorized to consent for the patient, the purpose of this procedure and the potential benefits and risks involved with this procedure.  The benefits include less needle sticks, lab draws from the catheter and patient may be discharged home with the catheter.  Risks include, but not limited to, infection, bleeding, blood clot (thrombus formation), and puncture of an artery; nerve damage and irregular heat beat.  Alternatives to this procedure were also discussed.  PICC/Midline Placement Documentation  PICC / Midline Single Lumen 07/06/12 PICC Right Brachial (Active)       Steven [name] 07/06/2012, 8:37 PM

## 2012-07-06 NOTE — Progress Notes (Signed)
[facility] PROGRESS NOTE  Steven [name] ZOX:[id number] DOB: September 29, 1950 DOA: 06/23/2012 PCP: Lehman Prom, NP  Assessment/Plan:                                                                                                             Necrotizing pancreatitis - fever resolved  - Leukocytosis resolved - surgery following for potential debridement, no surgical indication for that - US abdomen show gallstones which may suggest an etiology. Per surgery, no need for acute cholecystectomy, repeat imaging in 6 weeks ad plan for cholecystectomy at that time if there is no pseudocyst formation. 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 Plan discussed with GI as well.    Polymicrobial bacteremia/Leukocytosis - C diff negative. patient is afebrile. Chest x-ray and UA are unremarkable except the chest x-ray showing mild congestion. He was found to have polymicrobial bacteremia with Positive Blood cultures showing enterococcus and klebsiella. ID consulted for further recommendations. CT abd and pelvis was done and showed necrotizing pancreatitis. Started him on primaxin. - ID following, appreciate input - continue antibiotics, appreciate ID input regarding total length.  - plan for PICC line today.   Left femoral intertrochanteric fracture -  - Underwent ORIF of the left femur on 4/18. PT/OT evaluation, plan for SNF placement. Pain control.   Sinus tachycardia - May be pain, related due to dehydration or most likely due to #1.  CT angiogram of the chest to rule out PE was negative. Free t4 and TSH within normal limits. Echocardiogram showed good LVEF without any wall abn. His tachycardia improved and he is b blockers. Stable, discontinue telemetry  History of CVA with left-sided hemiparesis - initially plavix held  For surgery of the left hip. Back on  Plavix, may need to be stopped again if planning chole.  Dementia - no acute issues. Chronic left humerus fracture. no acute issues.  Hypokalemia; replete as needed  DVT prophylaxis on lovenox.   Code Status: full code Family Communication: none Disposition Plan: SNF when medically stable  PROCEDURE: Open reduction and internal fixation of the left comminuted intertrochanteric femur fracture on 4/18  Consultants:  Cardiology for tachycardic  Orthopedics   ID Dr Orvan Falconer.  GI Dr. Arlyce Dice  HPI/Subjective: - denies chest pain, endorses left hip pain  Objective: Filed Vitals:   07/06/12 0000 07/06/12 0400 07/06/12 0500 07/06/12 0555  BP:    116/92  Pulse:    99  Temp:    97.5 F (36.4 C)  TempSrc:    Oral  Resp: 20 18  18   Height:      Weight:   66.8 kg (147 lb 4.3 oz)   SpO2: 90% 92%  94%    Intake/Output Summary (Last 24 hours) at 07/06/12 0852 Last data filed at 07/06/12 0600  Gross per 24 hour  Intake 1565.83 ml  Output    850 ml  Net 715.83 ml  Filed Weights   06/24/12 0837 07/05/12 0705 07/06/12 0500  Weight: 64.411 kg (142 lb) 64.819 kg (142 lb 14.4 oz) 66.8 kg (147 lb 4.3 oz)    Exam: General: Well-developed well-nourished in no apparent distress  Cardiovascular: S1-S2 heard tachycardic.  Respiratory: No rhonchi or crepitations.  Abdomen: Soft with mild tenderness bowel sounds present.   Musculoskeletal: no edema Neurologic: Has left-sided weakness from old stroke  Data Reviewed: Basic Metabolic Panel:  Recent Labs Lab 06/30/12 0510 07/01/12 0528 07/02/12 0530 07/03/12 0542 07/04/12 0510 07/06/12 0440  NA 136 138 135 135  --  133*  K 2.9* 3.1* 3.2* 3.3* 3.3* 3.4*  CL 105 105 101 101  --  99  CO2 22 24 26 24   --  26  GLUCOSE 130* 103* 106* 99  --  89  BUN 21 17 16 15   --  7  CREATININE 0.56 0.59 0.63 0.53  --  0.57  CALCIUM 7.5* 7.6* 7.5* 7.4*  --  7.7*  MG  --   --   --   --  1.8 1.9   Liver Function Tests:  Recent Labs Lab  06/30/12 0510 07/01/12 0528 07/06/12 0440  AST 28 53* 28  ALT 33 43 24  ALKPHOS 70 76 176*  BILITOT 0.8 0.9 0.7  PROT 5.0* 5.0* 5.2*  ALBUMIN 1.9* 1.9* 1.9*    Recent Labs Lab 07/04/12 0510  LIPASE 38   CBC:  Recent Labs Lab 07/02/12 0530 07/03/12 0542 07/04/12 0510 07/05/12 0418 07/06/12 0440  WBC 22.5* 12.0* 11.3* 9.0 8.8  HGB 9.7* 8.7* 9.4* 9.4* 9.0*  HCT 28.8* 25.5* 27.3* 27.2* 26.6*  MCV 83.2 84.2 82.7 82.7 83.1  PLT 293 297 363 384 373   BNP (last 3 results)  Recent Labs  06/24/12 0405 06/24/12 0551  PROBNP 1874.0* 1830.0*   Recent Results (from the past 240 hour(s))  CULTURE, BLOOD (ROUTINE X 2)     Status: None   Collection Time    06/27/12  1:01 PM      Result Value Range Status   Specimen Description BLOOD LEFT ARM   Final   Special Requests BOTTLES DRAWN AEROBIC ONLY 1CC   Final   Culture  Setup Time 06/27/2012 17:11   Final   Culture NO GROWTH 5 DAYS   Final   Report Status 07/03/2012 FINAL   Final  CULTURE, BLOOD (ROUTINE X 2)     Status: None   Collection Time    06/27/12  1:21 PM      Result Value Range Status   Specimen Description BLOOD LEFT HAND   Final   Special Requests BOTTLES DRAWN AEROBIC ONLY 1CC   Final   Culture  Setup Time 06/27/2012 17:10   Final   Culture NO GROWTH 5 DAYS   Final   Report Status 07/03/2012 FINAL   Final  URINE CULTURE     Status: None   Collection Time    06/29/12  6:20 AM      Result Value Range Status   Specimen Description URINE, RANDOM   Final   Special Requests Immunocompromised   Final   Culture  Setup Time 06/29/2012 08:38   Final   Colony Count NO GROWTH   Final   Culture NO GROWTH   Final   Report Status 06/30/2012 FINAL   Final  CLOSTRIDIUM DIFFICILE BY PCR     Status: None   Collection Time    06/29/12  4:45 PM  Result Value Range Status   C difficile by pcr NEGATIVE  NEGATIVE Final     Studies: Ct Abd Wo & W Cm  07/05/2012  *RADIOLOGY REPORT*  Clinical Data: Follow-up of  abnormal pancreas.  Query mass versus pancreatitis.  CT ABDOMEN WITHOUT AND WITH CONTRAST  Technique:  Multidetector CT imaging of the abdomen was performed following the standard protocol before and during bolus administration of intravenous contrast. Contrast enhanced images are obtained during the arterial, portal, and delayed phases.  Contrast: OMNIPAQUE IOHEXOL 350 MG/ML SOLN  Comparison: CT abdomen and pelvis 06/27/2012.  Findings: Bilateral pleural effusions with basilar atelectasis. Coronary artery calcifications.  Unenhanced images of the abdomen demonstrate diffuse infiltration around the pancreas consistent with changes of acute pancreatitis. There are small calcifications in the region of the head of the pancreas and vascular calcifications are present.  There is increased density demonstrated in the splenic vein suggesting venous thrombosis.  Arterial and portal phase images demonstrate heterogeneous enhancement of the pancreas with hypo enhancing pancreatic tail consistent with pancreatic necrosis.  There is hypo enhancement of the head of the pancreas with heterogeneous gas collections consistent with pancreatic necrosis or abscess.  Infiltration or edema in the peripancreatic fat with a developing heterogeneous fluid and tissue density in the region of the tail the pancreas and splenic hilum.  No discrete pseudocyst is demonstrated. No discrete pancreatic mass is identified, although masses could be obscured by the inflammatory process.  The celiac axis, superior mesenteric artery, and splenic artery appear patent.  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 Focal thrombus demonstrated in the mesenteric vein.  Portal veins are patent.  There is edema throughout the mesenteric fat.  The stomach and duodenum are decompressed.  The liver and spleen are unremarkable.  There is minimal intra and extrahepatic bile duct dilatation but no filling defect is identified to suggest a stone. The  gallbladder is contracted.  Kidneys are symmetrical with symmetrical nephrograms.  No hydronephrosis.  Calcification of the abdominal aorta.  Inferior vena cava is patent without distension. No free air in the abdomen.  IMPRESSION: Inflammatory changes and around the pancreas consistent with acute pancreatitis.  Pancreatic necrosis demonstrated in the head and tail of the pancreas.  Gas collection in the head of the pancreas may be related pancreatic necrosis or abscess.  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  No discrete mass lesions identified.   Original Report Authenticated By: Burman Nieves, M.D.     Scheduled Meds: . acetaminophen  1,000 mg Oral TID  . cholecalciferol  1,000 Units Oral q morning - 10a  . clopidogrel  75 mg Oral Q breakfast  . enoxaparin (LOVENOX) injection  40 mg Subcutaneous Q24H  . imipenem-cilastatin  500 mg Intravenous Q8H  . lip balm  1 application Topical BID  . metoprolol tartrate  50 mg Oral BID  . multivitamin with minerals  1 tablet Oral q morning - 10a  . pantoprazole  40 mg Oral Daily  . potassium chloride  20 mEq Oral Daily  . psyllium  1 packet Oral BID  . saccharomyces boulardii  250 mg Oral BID  . tamsulosin  0.4 mg Oral QHS   Continuous Infusions: . lactated ringers 50 mL/hr at 07/06/12 1610    Principal Problem:   Closed left hip fracture Active Problems:   CVA   Dementia   Fracture of left humerus   Leucocytosis   Bacteremia due to Enterococcus   Bacteremia due to  Klebsiella pneumoniae   Abnormal finding on GI tract imaging   Pancreatitis, acute   Dilated bile duct   Time spent: 25 min  Pamella Pert  [facility] Pager [phone] If 7PM-7AM, please contact night-coverage at [email or web address], password [facility] 07/06/2012, 8:52 AM  LOS: 13 days

## 2012-07-06 NOTE — Progress Notes (Signed)
Patient ID: Steven [name], male   DOB: [birth year], 62 y.o.   MRN: [id number]         [facility]    Date of Admission:  06/23/2012   Total days of antibiotics 14        Day 9 imipenem         Principal Problem:   Closed left hip fracture Active Problems:   CVA   Dementia   Fracture of left humerus   Leucocytosis   Bacteremia due to Enterococcus   Bacteremia due to Klebsiella pneumoniae   Abnormal finding on GI tract imaging   Pancreatitis, acute   Dilated bile duct   Pancreatic necrosis   . acetaminophen  1,000 mg Oral TID  . cholecalciferol  1,000 Units Oral q morning - 10a  . clopidogrel  75 mg Oral Q breakfast  . enoxaparin (LOVENOX) injection  40 mg Subcutaneous Q24H  . imipenem-cilastatin  500 mg Intravenous Q8H  . lip balm  1 application Topical BID  . metoprolol tartrate  50 mg Oral BID  . multivitamin with minerals  1 tablet Oral q morning - 10a  . pantoprazole  40 mg Oral Daily  . potassium chloride  20 mEq Oral Daily  . psyllium  1 packet Oral BID  . saccharomyces boulardii  250 mg Oral BID  . tamsulosin  0.4 mg Oral QHS    Subjective: He still having abdominal pain.  Objective: Temp:  [97.5 F (36.4 C)-98.1 F (36.7 C)] 98.1 F (36.7 C) (04/29 1501) Pulse Rate:  [99-121] 99 (04/29 1501) Resp:  [18-20] 20 (04/29 1501) BP: (90-116)/(59-92) 92/62 mmHg (04/29 1501) SpO2:  [90 %-100 %] 100 % (04/29 1501) Weight:  [66.8 kg (147 lb 4.3 oz)] 66.8 kg (147 lb 4.3 oz) (04/29 0500)  General: He is sitting up in a chair. Remains somewhat confused Abdomen: Soft with mild upper quadrant tenderness  Lab Results Lab Results  Component Value Date   WBC 8.8 07/06/2012   HGB 9.0* 07/06/2012   HCT 26.6* 07/06/2012   MCV 83.1 07/06/2012   PLT 373 07/06/2012    Lab Results  Component Value Date   CREATININE 0.57 07/06/2012   BUN 7 07/06/2012   NA 133* 07/06/2012   K 3.4* 07/06/2012   CL 99 07/06/2012   CO2 26 07/06/2012    Lab Results    Component Value Date   ALT 24 07/06/2012   AST 28 07/06/2012   ALKPHOS 176* 07/06/2012   BILITOT 0.7 07/06/2012      Microbiology: Recent Results (from the past 240 hour(s))  CULTURE, BLOOD (ROUTINE X 2)     Status: None   Collection Time    06/27/12  1:01 PM      Result Value Range Status   Specimen Description BLOOD LEFT ARM   Final   Special Requests BOTTLES DRAWN AEROBIC ONLY 1CC   Final   Culture  Setup Time 06/27/2012 17:11   Final   Culture NO GROWTH 5 DAYS   Final   Report Status 07/03/2012 FINAL   Final  CULTURE, BLOOD (ROUTINE X 2)     Status: None   Collection Time    06/27/12  1:21 PM      Result Value Range Status   Specimen Description BLOOD LEFT HAND   Final   Special Requests BOTTLES DRAWN AEROBIC ONLY 1CC   Final   Culture  Setup Time 06/27/2012 17:10   Final   Culture NO  GROWTH 5 DAYS   Final   Report Status 07/03/2012 FINAL   Final  URINE CULTURE     Status: None   Collection Time    06/29/12  6:20 AM      Result Value Range Status   Specimen Description URINE, RANDOM   Final   Special Requests Immunocompromised   Final   Culture  Setup Time 06/29/2012 08:38   Final   Colony Count NO GROWTH   Final   Culture NO GROWTH   Final   Report Status 06/30/2012 FINAL   Final  CLOSTRIDIUM DIFFICILE BY PCR     Status: None   Collection Time    06/29/12  4:45 PM      Result Value Range Status   C difficile by pcr NEGATIVE  NEGATIVE Final    Studies/Results: Ct Abd Wo & W Cm  07/05/2012  *RADIOLOGY REPORT*  Clinical Data: Follow-up of abnormal pancreas.  Query mass versus pancreatitis.  CT ABDOMEN WITHOUT AND WITH CONTRAST  Technique:  Multidetector CT imaging of the abdomen was performed following the standard protocol before and during bolus administration of intravenous contrast. Contrast enhanced images are obtained during the arterial, portal, and delayed phases.  Contrast: OMNIPAQUE IOHEXOL 350 MG/ML SOLN  Comparison: CT abdomen and pelvis 06/27/2012.   Findings: Bilateral pleural effusions with basilar atelectasis. Coronary artery calcifications.  Unenhanced images of the abdomen demonstrate diffuse infiltration around the pancreas consistent with changes of acute pancreatitis. There are small calcifications in the region of the head of the pancreas and vascular calcifications are present.  There is increased density demonstrated in the splenic vein suggesting venous thrombosis.  Arterial and portal phase images demonstrate heterogeneous enhancement of the pancreas with hypo enhancing pancreatic tail consistent with pancreatic necrosis.  There is hypo enhancement of the head of the pancreas with heterogeneous gas collections consistent with pancreatic necrosis or abscess.  Infiltration or edema in the peripancreatic fat with a developing heterogeneous fluid and tissue density in the region of the tail the pancreas and splenic hilum.  No discrete pseudocyst is demonstrated. No discrete pancreatic mass is identified, although masses could be obscured by the inflammatory process.  The celiac axis, superior mesenteric artery, and splenic artery appear patent.  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 Focal thrombus demonstrated in the mesenteric vein.  Portal veins are patent.  There is edema throughout the mesenteric fat.  The stomach and duodenum are decompressed.  The liver and spleen are unremarkable.  There is minimal intra and extrahepatic bile duct dilatation but no filling defect is identified to suggest a stone. The gallbladder is contracted.  Kidneys are symmetrical with symmetrical nephrograms.  No hydronephrosis.  Calcification of the abdominal aorta.  Inferior vena cava is patent without distension. No free air in the abdomen.  IMPRESSION: Inflammatory changes and around the pancreas consistent with acute pancreatitis.  Pancreatic necrosis demonstrated in the head and tail of the pancreas.  Gas collection in the head of the pancreas may be related  pancreatic necrosis or abscess.  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  No discrete mass lesions identified.   Original Report Authenticated By: Burman Nieves, M.D.     Assessment: To my eye, his CT scan shows worsening pancreatic necrosis. Given that he had polymicrobial bacteremia recently I assume that he has infected pancreatic necrosis. Fortunately he is clinically stable and it is possible that with a longer course of IV antibiotic therapy he will stabilize  then improved. He will need serial CT scans and could require pancreatic debridement if he is not improving. He is on imipenem because he is allergic to beta lactams and because imipenem has good pancreatic penetration.  Plan: 1. Continue imipenem for at least 2 more weeks 2. I will be away tomorrow, but Dr. Merceda Elks will assume his care on May 1  Cliffton Asters, MD [facility] [phone] pager   [phone] cell 07/06/2012, 3:49 PM

## 2012-07-06 NOTE — Progress Notes (Signed)
ANTICOAGULATION CONSULT NOTE - Initial Consult  Pharmacy Consult for Lovenox Indication: VTE treatment  Allergies  Allergen Reactions  . Cefazolin Rash  . Penicillins     REACTION: unknown allergy - hx since childhood    Patient Measurements: Height: 5' 1.81" (157 cm) Weight: 147 lb 4.3 oz (66.8 kg) IBW/kg (Calculated) : 54.17 Heparin Dosing Weight:   Vital Signs: Temp: 98.1 F (36.7 C) (04/29 1501) Temp src: Oral (04/29 1501) BP: 92/62 mmHg (04/29 1501) Pulse Rate: 99 (04/29 1501)  Labs:  Recent Labs  07/04/12 0510 07/05/12 0418 07/06/12 0440  HGB 9.4* 9.4* 9.0*  HCT 27.3* 27.2* 26.6*  PLT 363 384 373  CREATININE  --   --  0.57    Estimated Creatinine Clearance: 81.2 ml/min (by C-G formula based on Cr of 0.57).   Medical History: Past Medical History  Diagnosis Date  . Lumbar spondylitis   . Anxiety   . Urinary incontinence   . GERD (gastroesophageal reflux disease)   . Stroke   . Chronic pain   . Hypertonicity of bladder   . Fracture of left humerus   . History of alcoholism   . Alcoholic hepatitis 2001  . 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   Marland Kitchen Epistaxis 2001    required cauterization  . Thrombus of left atrial appendage 2003    Medications:  Scheduled:  . acetaminophen  1,000 mg Oral TID  . cholecalciferol  1,000 Units Oral q morning - 10a  . clopidogrel  75 mg Oral Q breakfast  . enoxaparin (LOVENOX) injection  40 mg Subcutaneous Q24H  . imipenem-cilastatin  500 mg Intravenous Q8H  . lip balm  1 application Topical BID  . metoprolol tartrate  50 mg Oral BID  . multivitamin with minerals  1 tablet Oral q morning - 10a  . pantoprazole  40 mg Oral Daily  . potassium chloride  20 mEq Oral Daily  . psyllium  1 packet Oral BID  . saccharomyces boulardii  250 mg Oral BID  . tamsulosin  0.4 mg Oral QHS  . [DISCONTINUED] imipenem-cilastatin  500 mg Intravenous Q8H   Infusions:  . lactated ringers 50 mL/hr at 07/06/12 0233   PRN: alum &  mag hydroxide-simeth, bisacodyl, bismuth subsalicylate, fentaNYL, [COMPLETED] iohexol, magic mouthwash, menthol-cetylpyridinium, metoCLOPramide (REGLAN) injection, metoCLOPramide, ondansetron (ZOFRAN) IV, ondansetron, oxyCODONE, phenol, promethazine  Assessment: 62 yo M with hx of CVA with left sided hemiparesis and acute pancreatitis.  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

## 2012-07-07 DIAGNOSIS — S72009D Fracture of unspecified part of neck of unspecified femur, subsequent encounter for closed fracture with routine healing: Secondary | ICD-10-CM

## 2012-07-07 DIAGNOSIS — K55059 Acute (reversible) ischemia of intestine, part and extent unspecified: Secondary | ICD-10-CM

## 2012-07-07 LAB — CBC
HCT: 25.6 % — ABNORMAL LOW (ref 39.0–52.0)
Hemoglobin: 8.3 g/dL — ABNORMAL LOW (ref 13.0–17.0)
MCH: 27 pg (ref 26.0–34.0)
MCV: 83.4 fL (ref 78.0–100.0)
RBC: 3.07 MIL/uL — ABNORMAL LOW (ref 4.22–5.81)

## 2012-07-07 LAB — GLUCOSE, CAPILLARY

## 2012-07-07 MED ORDER — FAT EMULSION 20 % IV EMUL
240.0000 mL | INTRAVENOUS | Status: DC
  Administered 2012-07-07: 240 mL via INTRAVENOUS
  Filled 2012-07-07: qty 250

## 2012-07-07 MED ORDER — FAT EMULSION 20 % IV EMUL
240.0000 mL | INTRAVENOUS | Status: AC
  Administered 2012-07-08: 240 mL via INTRAVENOUS
  Filled 2012-07-07: qty 250

## 2012-07-07 MED ORDER — CLINIMIX E/DEXTROSE (5/20) 5 % IV SOLN
INTRAVENOUS | Status: AC
  Administered 2012-07-08: via INTRAVENOUS
  Filled 2012-07-07: qty 1000

## 2012-07-07 MED ORDER — LACTATED RINGERS IV SOLN
INTRAVENOUS | Status: DC
  Administered 2012-07-07 – 2012-07-12 (×2): via INTRAVENOUS
  Administered 2012-07-14: 20 mL/h via INTRAVENOUS

## 2012-07-07 MED ORDER — DEXTROSE 10 % IV SOLN: INTRAVENOUS | Status: DC

## 2012-07-07 MED ORDER — CLINIMIX E/DEXTROSE (5/20) 5 % IV SOLN
INTRAVENOUS | Status: DC
  Administered 2012-07-07: 18:00:00 via INTRAVENOUS
  Filled 2012-07-07: qty 1000

## 2012-07-07 MED ORDER — INSULIN ASPART 100 UNIT/ML ~~LOC~~ SOLN
0.0000 [IU] | Freq: Three times a day (TID) | SUBCUTANEOUS | Status: DC
  Administered 2012-07-08 – 2012-07-09 (×3): 1 [IU] via SUBCUTANEOUS
  Administered 2012-07-09: 2 [IU] via SUBCUTANEOUS
  Administered 2012-07-09 – 2012-07-10 (×2): 1 [IU] via SUBCUTANEOUS
  Administered 2012-07-10 – 2012-07-11 (×4): 2 [IU] via SUBCUTANEOUS

## 2012-07-07 NOTE — Progress Notes (Signed)
TNA hung but has SL PICC so at this time no PIV access, may need exchange for DL if needing TNA and antibiotics.

## 2012-07-07 NOTE — Progress Notes (Signed)
[facility] Progress Note  SUBJECTIVE: feels okay.   OBJECTIVE:  Vital signs in last 24 hours: Temp:  [97.9 F (36.6 C)-98.1 F (36.7 C)] 98.1 F (36.7 C) (04/30 0516) Pulse Rate:  [85-99] 85 (04/30 0516) Resp:  [18-20] 18 (04/30 0516) BP: (92-126)/(62-84) 126/84 mmHg (04/30 0516) SpO2:  [97 %-100 %] 97 % (04/30 0516) Last BM Date: 07/06/12 General:    white male in NAD Abdomen:  Soft, not particularly tender. Normal bowel sounds. Extremities:  Without edema. Neurologic:  Alert and oriented,  grossly normal neurologically. Psych:  Cooperative. Normal mood and affect.  Lab Results:  Recent Labs  07/05/12 0418 07/06/12 0440 07/07/12 0500  WBC 9.0 8.8 7.2  HGB 9.4* 9.0* 8.3*  HCT 27.2* 26.6* 25.6*  PLT 384 373 357   BMET  Recent Labs  07/06/12 0440  NA 133*  K 3.4*  CL 99  CO2 26  GLUCOSE 89  BUN 7  CREATININE 0.57  CALCIUM 7.7*   LFT  Recent Labs  07/06/12 0440  PROT 5.2*  ALBUMIN 1.9*  AST 28  ALT 24  ALKPHOS 176*  BILITOT 0.7   Studies/Results: Ct Abd Wo & W Cm  07/05/2012  *RADIOLOGY REPORT*  Clinical Data: Follow-up of abnormal pancreas.  Query mass versus pancreatitis.  CT ABDOMEN WITHOUT AND WITH CONTRAST  Technique:  Multidetector CT imaging of the abdomen was performed following the standard protocol before and during bolus administration of intravenous contrast. Contrast enhanced images are obtained during the arterial, portal, and delayed phases.  Contrast: OMNIPAQUE IOHEXOL 350 MG/ML SOLN  Comparison: CT abdomen and pelvis 06/27/2012.  Findings: Bilateral pleural effusions with basilar atelectasis. Coronary artery calcifications.  Unenhanced images of the abdomen demonstrate diffuse infiltration around the pancreas consistent with changes of acute pancreatitis. There are small calcifications in the region of the head of the pancreas and vascular calcifications are present.  There is increased density demonstrated in the splenic  vein suggesting venous thrombosis.  Arterial and portal phase images demonstrate heterogeneous enhancement of the pancreas with hypo enhancing pancreatic tail consistent with pancreatic necrosis.  There is hypo enhancement of the head of the pancreas with heterogeneous gas collections consistent with pancreatic necrosis or abscess.  Infiltration or edema in the peripancreatic fat with a developing heterogeneous fluid and tissue density in the region of the tail the pancreas and splenic hilum.  No discrete pseudocyst is demonstrated. No discrete pancreatic mass is identified, although masses could be obscured by the inflammatory process.  The celiac axis, superior mesenteric artery, and splenic artery appear patent.  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 Focal thrombus demonstrated in the mesenteric vein.  Portal veins are patent.  There is edema throughout the mesenteric fat.  The stomach and duodenum are decompressed.  The liver and spleen are unremarkable.  There is minimal intra and extrahepatic bile duct dilatation but no filling defect is identified to suggest a stone. The gallbladder is contracted.  Kidneys are symmetrical with symmetrical nephrograms.  No hydronephrosis.  Calcification of the abdominal aorta.  Inferior vena cava is patent without distension. No free air in the abdomen.  IMPRESSION: Inflammatory changes and around the pancreas consistent with acute pancreatitis.  Pancreatic necrosis demonstrated in the head and tail of the pancreas.  Gas collection in the head of the pancreas may be related pancreatic necrosis or abscess.  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  No discrete mass lesions identified.   Original Report Authenticated By:  Burman Nieves, M.D.    ASSESSMENT / PLAN:  1. Necrotizing pancreatitis. Possibly biliary pancreatitis, LFTs normal but he does have gallstones and CBD of 13mm by ultrasound Repeat CTscan reveals changes c/w acute  pancreatitis with necrosis in head and tail. Ca-19-9 pending. Still getting Primaxin. Surgery following. Patient to start TNA.   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 Started on Lovenox yesterday .  3. Dementia.    LOS: 14 days   Willette Cluster  07/07/2012, 10:43 AM   [facility] Attending  I have also seen and assessed the patient and agree with the above note. Await CA 19-9. At this point I have no other suggestions. Will sign off but let us know if other ?'s arise or we are needed. Looks like long TPN and CT f/u per [facility].  Iva Boop, MD, [facility] [phone] (pager) 07/07/2012 3:44 PM

## 2012-07-07 NOTE — Progress Notes (Signed)
CSW following for discharge planning - patient has bed at [facility] when medically ready. CSW has completed FL2 & will continue to follow and assist with return.    Unice Bailey, LCSW [facility] Clinical Social Worker cell #: [phone]

## 2012-07-07 NOTE — Progress Notes (Signed)
Patient ID: Steven [name], male   DOB: 1950-03-17, 62 y.o.   MRN: [id number] 12 Days Post-Op  Subjective: Rough night, no changes in abdominal pain, denies n/v  Objective: Vital signs in last 24 hours: Temp:  [97.9 F (36.6 C)-98.1 F (36.7 C)] 98.1 F (36.7 C) (04/30 0516) Pulse Rate:  [85-99] 85 (04/30 0516) Resp:  [18-20] 18 (04/30 0516) BP: (92-126)/(62-84) 126/84 mmHg (04/30 0516) SpO2:  [97 %-100 %] 97 % (04/30 0516) Last BM Date: 07/06/12   Intake/Output from previous day: 04/29 0701 - 04/30 0700 In: 1640 [P.O.:140; I.V.:1100; IV Piggyback:400] Out: -  Intake/Output this shift:    General appearance: alert, cooperative, no distress and pt remains confused. GI: soft, not really tender right now, +bs.  Lab Results:   Recent Labs  07/06/12 0440 07/07/12 0500  WBC 8.8 7.2  HGB 9.0* 8.3*  HCT 26.6* 25.6*  PLT 373 357    BMET  Recent Labs  07/06/12 0440  NA 133*  K 3.4*  CL 99  CO2 26  GLUCOSE 89  BUN 7  CREATININE 0.57  CALCIUM 7.7*   PT/INR No results found for this basename: LABPROT, INR,  in the last 72 hours   Recent Labs Lab 07/01/12 0528 07/06/12 0440  AST 53* 28  ALT 43 24  ALKPHOS 76 176*  BILITOT 0.9 0.7  PROT 5.0* 5.2*  ALBUMIN 1.9* 1.9*     Lipase     Component Value Date/Time   LIPASE 38 07/04/2012 0510     Studies/Results: Ct Abd Wo & W Cm  07/05/2012  *RADIOLOGY REPORT*  Clinical Data: Follow-up of abnormal pancreas.  Query mass versus pancreatitis.  CT ABDOMEN WITHOUT AND WITH CONTRAST  Technique:  Multidetector CT imaging of the abdomen was performed following the standard protocol before and during bolus administration of intravenous contrast. Contrast enhanced images are obtained during the arterial, portal, and delayed phases.  Contrast: OMNIPAQUE IOHEXOL 350 MG/ML SOLN  Comparison: CT abdomen and pelvis 06/27/2012.  Findings: Bilateral pleural effusions with basilar atelectasis. Coronary artery calcifications.   Unenhanced images of the abdomen demonstrate diffuse infiltration around the pancreas consistent with changes of acute pancreatitis. There are small calcifications in the region of the head of the pancreas and vascular calcifications are present.  There is increased density demonstrated in the splenic vein suggesting venous thrombosis.  Arterial and portal phase images demonstrate heterogeneous enhancement of the pancreas with hypo enhancing pancreatic tail consistent with pancreatic necrosis.  There is hypo enhancement of the head of the pancreas with heterogeneous gas collections consistent with pancreatic necrosis or abscess.  Infiltration or edema in the peripancreatic fat with a developing heterogeneous fluid and tissue density in the region of the tail the pancreas and splenic hilum.  No discrete pseudocyst is demonstrated. No discrete pancreatic mass is identified, although masses could be obscured by the inflammatory process.  The celiac axis, superior mesenteric artery, and splenic artery appear patent.  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 Focal thrombus demonstrated in the mesenteric vein.  Portal veins are patent.  There is edema throughout the mesenteric fat.  The stomach and duodenum are decompressed.  The liver and spleen are unremarkable.  There is minimal intra and extrahepatic bile duct dilatation but no filling defect is identified to suggest a stone. The gallbladder is contracted.  Kidneys are symmetrical with symmetrical nephrograms.  No hydronephrosis.  Calcification of the abdominal aorta.  Inferior vena cava is patent without distension. No free air  in the abdomen.  IMPRESSION: Inflammatory changes and around the pancreas consistent with acute pancreatitis.  Pancreatic necrosis demonstrated in the head and tail of the pancreas.  Gas collection in the head of the pancreas may be related pancreatic necrosis or abscess.  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   No discrete mass lesions identified.   Original Report Authenticated By: Burman Nieves, M.D.    Dg Chest Port 1 View  07/06/2012  *RADIOLOGY REPORT*  Clinical Data: Line placement.  PORTABLE CHEST - 1 VIEW  Comparison: 06/24/2012.  Findings: Patient is rotated to the left.  There is a right upper extremity PICC.  The tip is in the mid to lower SVC, just inferior to the carina.  The bilateral basilar opacities present, probably representing atelectasis.  IMPRESSION: New right upper extremity PICC with the tip in the mid to lower SVC.  Basilar opacity most compatible with atelectasis. Technically suboptimal projection due to rotation.   Original Report Authenticated By: Andreas Newport, M.D.     Medications: . acetaminophen  1,000 mg Oral TID  . cholecalciferol  1,000 Units Oral q morning - 10a  . clopidogrel  75 mg Oral Q breakfast  . enoxaparin (LOVENOX) injection  70 mg Subcutaneous Q12H  . imipenem-cilastatin  500 mg Intravenous Q8H  . lip balm  1 application Topical BID  . metoprolol tartrate  50 mg Oral BID  . multivitamin with minerals  1 tablet Oral q morning - 10a  . pantoprazole  40 mg Oral Daily  . potassium chloride  20 mEq Oral Daily  . psyllium  1 packet Oral BID  . saccharomyces boulardii  250 mg Oral BID  . tamsulosin  0.4 mg Oral QHS    Assessment/Plan 1.  Acute necrotizing pancreatitis- US shows gallstones and mild biliary dilatation so gallstone etiology likely. Repeat CT still show acute pancreatitis, pancreatic necroisis at  The head and tail of the pancreas.necrosis or abscess.   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  2. Positive bacteremia of uncertain etiology. Been followed by ID.  3. 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  4. Significant dementia; currently resides at a skilled nursing facility.  5. Prior history of alcohol and substance abuse.  6. Follow with left hip ORIF 4/17 Dr. Charlann Boxer  7. Urinary  incontinence/hypertonicity of the bladder.  8. Prior fracture of the left humerus, with history of multiple trauma/crush fracture.  9. Lumbar spondylitis  Plan:  Dr. Maisie Fus has reviewed the films from yesterday and it is her opinion he should be started on TNA, and just sips of clears, he's not taking much anyway.  Also consider heparin for Splenic and SMA thrombosis.  Will defer to medicine.  No changes today, doesn't look like TNA has been started yet       LOS: 14 days    Kahliyah Dick 07/07/2012

## 2012-07-07 NOTE — Progress Notes (Signed)
PARENTERAL NUTRITION CONSULT NOTE - INITIAL  Pharmacy Consult for TNA Indication: severe pancreatitis  Allergies  Allergen Reactions  . Cefazolin Rash  . Penicillins     REACTION: unknown allergy - hx since childhood    Patient Measurements: Height: 5' 1.81" (157 cm) Weight: 147 lb 4.3 oz (66.8 kg) IBW/kg (Calculated) : 54.17 Adjusted Body Weight: 58 kg  Vital Signs: Temp: 98.1 F (36.7 C) (04/30 0516) Temp src: Oral (04/30 0516) BP: 126/84 mmHg (04/30 0516) Pulse Rate: 85 (04/30 0516) Intake/Output from previous day: 04/29 0701 - 04/30 0700 In: 1640 [P.O.:140; I.V.:1100; IV Piggyback:400] Out: -  Intake/Output from this shift:    Labs:  Recent Labs  07/05/12 0418 07/06/12 0440 07/07/12 0500  WBC 9.0 8.8 7.2  HGB 9.4* 9.0* 8.3*  HCT 27.2* 26.6* 25.6*  PLT 384 373 357     Recent Labs  07/06/12 0440  NA 133*  K 3.4*  CL 99  CO2 26  GLUCOSE 89  BUN 7  CREATININE 0.57  CALCIUM 7.7*  MG 1.9  PROT 5.2*  ALBUMIN 1.9*  AST 28  ALT 24  ALKPHOS 176*  BILITOT 0.7   Estimated Creatinine Clearance: 81.2 ml/min (by C-G formula based on Cr of 0.57).   No results found for this basename: GLUCAP,  in the last 72 hours  Medical History: Past Medical History  Diagnosis Date  . Lumbar spondylitis   . Anxiety   . Urinary incontinence   . GERD (gastroesophageal reflux disease)   . Stroke   . Chronic pain   . Hypertonicity of bladder   . Fracture of left humerus   . History of alcoholism   . Alcoholic hepatitis 2001  . 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   Marland Kitchen Epistaxis 2001    required cauterization  . Thrombus of left atrial appendage 2003    Assessment:  61 yom with cecrotizing pancreatitis, [place] abd with gallstones. Surgery on board, no need for acute cholecystectomy. Plan repeat imaging in 6 weeks and plan for cholecystectomy at that time if there is no pseudocyst formation. Surgery would like to start TNA since patient will not be able to  tolerate anything more than clears for weeks. PICC placed 4/29, TNA order placed to start 4/30.  Per Surgery notes, plan to cycle TNA once at goal to assist with placement.   Nutritional Goals:  - RD recommendations: pending - Clinimix E 5/20 @ 70 + IVF 20% at 10 ml/hr on MWF will provide: 84 g protein, average of 1684 Kcal per day.  Current nutrition:  - Diet: Clear liquid - TNA: to start 4/30 PM - mIVF: LR @ 50 ml/hr  CBGs & Insulin requirements past 24 hours:  - CBGs controlled - no insulin  Labs: Electrolytes:  Na+ 3.4, Mag wnl, phos not checked Renal Function: Scr wnl/stable Hepatic Function: Alk phos elevated 4/29 @ 176 (prior to TNA initiation) Pre-Albumin: pending 5/1 Triglycerides: pending 5/1 CBGs: controlled, no h/o DM   Plan:  At 1800 tonight  Start Clinimix E 5/20 @ 40 ml/hr  TNA to contain IV fat emulsion 20% at 10 ml/hr on MWF only due to ongoing shortage  Patient is tolerating oral multivitamin tablet so will not add MV and trace elements to TNA. Reduce IVF to [facility].  Add sensitive SSI q8h  TNA labs Monday/Thursdays  Dietician consult  Pharmacy will follow up daily  Geoffry Paradise, PharmD, BCPS Pager: [phone] 1:24 PM Pharmacy #: [phone]

## 2012-07-07 NOTE — Progress Notes (Signed)
[facility] PROGRESS NOTE  Steven [name]:[id number] DOB: 1950-05-27 DOA: 06/23/2012 PCP: Lehman Prom, NP  Interval history 62 year old male patient, nursing home resident, PMH of CAD, dementia, CVA admitted on 06/24/12 after an unwitnessed fall. In the ED, x-rays revealed left hip fracture. He does have chronic left humerus fracture. Patient nonambulatory per family. He probably has dementia. Orthopedics was consulted and patient underwent ORIF of left hip fracture on 4/18. Patient had sinus tachycardia early during hospitalization. CTA chest negative for PE. TSH normal. Patient was found to have polymicrobial bacteremia due to enterococcus and Klebsiella. He was treated with broad-spectrum IV antibiotics-aztreonam, vancomycin and Levaquin for presumed healthcare acquired pneumonia. Infectious disease was consulted. CT abdomen showed necrotizing pancreatitis/abscess in the area of head of pancreas-likely source of bacteremia. A Gastrografin study did not show any bowel perforation. His antibiotics were narrowed to imipenem. Despite this, patient continued to have fevers and worsening leukocytosis. Surgeons were consulted. Patient has clinically improved. Surgeons, GI and ID continue to follow patient. Current plan is for n.p.o. except sips of clear liquids, TPN and prolonged antibiotics. 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 He eventually we'll need cholecystectomy. Etiology of pancreatitis-chronic alcoholic pancreatitis versus rule out malignancy or an diagnosed ulcer.  Assessment/Plan:                                                                                                             Necrotizing pancreatitis - fever resolved  - Leukocytosis resolved - surgery following for potential debridement, no surgical planned at this time. - US abdomen show gallstones which may suggest an etiology. Per surgery, no need for acute cholecystectomy, repeat  imaging in 6 weeks ad plan for cholecystectomy at that time if there is no pseudocyst formation. - plan for PICC, TPN and sips of clear liquids and IV antibiotics.  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 Plan discussed with GI as well.  Started on full dose Lovenox.  Polymicrobial bacteremia/Leukocytosis - C diff negative. patient is afebrile. Chest x-ray and UA are unremarkable except the chest x-ray showing mild congestion. He was found to have polymicrobial bacteremia with Positive Blood cultures showing enterococcus and klebsiella. ID consulted for further recommendations. CT abd and pelvis was done and showed necrotizing pancreatitis. Started him on primaxin. - ID following, appreciate input - continue antibiotics, appreciate ID input regarding total length.  - Has PICC line. Continue Primaxin  Left femoral intertrochanteric fracture -  - Underwent ORIF of the left femur on 4/18. PT/OT evaluation, plan for SNF placement. Pain control. Outpatient followup with orthopedics in 2 weeks.  Sinus tachycardia - May be pain, related due to dehydration or most likely due to #1.  CT angiogram of the chest to rule out PE was negative. Free t4 and TSH within normal limits. Echocardiogram showed good LVEF without any wall abn. His tachycardia improved and he is b blockers. Stable, discontinue telemetry. Likely secondary to  bacteremia.  History of CVA with left-sided hemiparesis - initially plavix held  For surgery of the left hip. Back on Plavix, may need to be stopped again if planning chole.   Dementia - no acute issues.  Chronic left humerus fracture. no acute issues.   Hypokalemia; replete as needed   Anemia: Likely multifactorial-chronic disease, acute illness and phlebotomies. Follow CBC name. Transfuse if hemoglobin less than 7 g/dL  DVT prophylaxis on lovenox.   Code  Status: full code Family Communication: none Disposition Plan: SNF when medically stable  PROCEDURE: Open reduction and internal fixation of the left comminuted intertrochanteric femur fracture on 4/18  Consultants:  Cardiology for tachycardic  Orthopedics   ID Dr Orvan Falconer.  GI Dr. Arlyce Dice  General surgery  HPI/Subjective: Complains of some abdominal pain. Poor historian. Per nursing, no acute issues.  Objective: Filed Vitals:   07/07/12 0000 07/07/12 0400 07/07/12 0516 07/07/12 1440  BP:   126/84 113/77  Pulse:   85 82  Temp:   98.1 F (36.7 C) 98.5 F (36.9 C)  TempSrc:   Oral Oral  Resp: 18 18 18 19   Height:      Weight:      SpO2:   97% 93%    Intake/Output Summary (Last 24 hours) at 07/07/12 1506 Last data filed at 07/07/12 0400  Gross per 24 hour  Intake   1135 ml  Output      0 ml  Net   1135 ml   Filed Weights   06/24/12 0837 07/05/12 0705 07/06/12 0500  Weight: 64.411 kg (142 lb) 64.819 kg (142 lb 14.4 oz) 66.8 kg (147 lb 4.3 oz)    Exam: General: Patient lying comfortably in bed without distress.  Cardiovascular: S1-S2, RRR. No JVD, murmurs or pedal edema. Respiratory: Reduced breath sounds in the bases but otherwise clear to auscultation. No increased work of breathing. Abdomen: Nondistended and soft. Mild epigastric tenderness but without rigidity, guarding or rebound. Musculoskeletal: no edema Neurologic: Alert and oriented to self and place. No new focal deficits.  Data Reviewed: Basic Metabolic Panel:  Recent Labs Lab 07/01/12 0528 07/02/12 0530 07/03/12 0542 07/04/12 0510 07/06/12 0440  NA 138 135 135  --  133*  K 3.1* 3.2* 3.3* 3.3* 3.4*  CL 105 101 101  --  99  CO2 24 26 24   --  26  GLUCOSE 103* 106* 99  --  89  BUN 17 16 15   --  7  CREATININE 0.59 0.63 0.53  --  0.57  CALCIUM 7.6* 7.5* 7.4*  --  7.7*  MG  --   --   --  1.8 1.9   Liver Function Tests:  Recent Labs Lab 07/01/12 0528 07/06/12 0440  AST 53* 28  ALT 43 24   ALKPHOS 76 176*  BILITOT 0.9 0.7  PROT 5.0* 5.2*  ALBUMIN 1.9* 1.9*    Recent Labs Lab 07/04/12 0510  LIPASE 38   CBC:  Recent Labs Lab 07/03/12 0542 07/04/12 0510 07/05/12 0418 07/06/12 0440 07/07/12 0500  WBC 12.0* 11.3* 9.0 8.8 7.2  HGB 8.7* 9.4* 9.4* 9.0* 8.3*  HCT 25.5* 27.3* 27.2* 26.6* 25.6*  MCV 84.2 82.7 82.7 83.1 83.4  PLT 297 363 384 373 357   BNP (last 3 results)  Recent Labs  06/24/12 0405 06/24/12 0551  PROBNP 1874.0* 1830.0*   Recent Results (from the past 240 hour(s))  URINE CULTURE     Status: None   Collection Time    06/29/12  6:20 AM      Result Value Range Status   Specimen Description URINE, RANDOM   Final   Special Requests Immunocompromised   Final   Culture  Setup Time 06/29/2012 08:38   Final   Colony Count NO GROWTH   Final   Culture NO GROWTH   Final   Report Status 06/30/2012 FINAL   Final  CLOSTRIDIUM DIFFICILE BY PCR     Status: None   Collection Time    06/29/12  4:45 PM      Result Value Range Status   C difficile by pcr NEGATIVE  NEGATIVE Final     Studies: Ct Abd Wo & W Cm  07/05/2012  *RADIOLOGY REPORT*  Clinical Data: Follow-up of abnormal pancreas.  Query mass versus pancreatitis.  CT ABDOMEN WITHOUT AND WITH CONTRAST  Technique:  Multidetector CT imaging of the abdomen was performed following the standard protocol before and during bolus administration of intravenous contrast. Contrast enhanced images are obtained during the arterial, portal, and delayed phases.  Contrast: OMNIPAQUE IOHEXOL 350 MG/ML SOLN  Comparison: CT abdomen and pelvis 06/27/2012.  Findings: Bilateral pleural effusions with basilar atelectasis. Coronary artery calcifications.  Unenhanced images of the abdomen demonstrate diffuse infiltration around the pancreas consistent with changes of acute pancreatitis. There are small calcifications in the region of the head of the pancreas and vascular calcifications are present.  There is increased density  demonstrated in the splenic vein suggesting venous thrombosis.  Arterial and portal phase images demonstrate heterogeneous enhancement of the pancreas with hypo enhancing pancreatic tail consistent with pancreatic necrosis.  There is hypo enhancement of the head of the pancreas with heterogeneous gas collections consistent with pancreatic necrosis or abscess.  Infiltration or edema in the peripancreatic fat with a developing heterogeneous fluid and tissue density in the region of the tail the pancreas and splenic hilum.  No discrete pseudocyst is demonstrated. No discrete pancreatic mass is identified, although masses could be obscured by the inflammatory process.  The celiac axis, superior mesenteric artery, and splenic artery appear patent.  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 Focal thrombus demonstrated in the mesenteric vein.  Portal veins are patent.  There is edema throughout the mesenteric fat.  The stomach and duodenum are decompressed.  The liver and spleen are unremarkable.  There is minimal intra and extrahepatic bile duct dilatation but no filling defect is identified to suggest a stone. The gallbladder is contracted.  Kidneys are symmetrical with symmetrical nephrograms.  No hydronephrosis.  Calcification of the abdominal aorta.  Inferior vena cava is patent without distension. No free air in the abdomen.  IMPRESSION: Inflammatory changes and around the pancreas consistent with acute pancreatitis.  Pancreatic necrosis demonstrated in the head and tail of the pancreas.  Gas collection in the head of the pancreas may be related pancreatic necrosis or abscess.  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  No discrete mass lesions identified.   Original Report Authenticated By: Burman Nieves, M.D.    Dg Chest Port 1 View  07/06/2012  *RADIOLOGY REPORT*  Clinical Data: Line placement.  PORTABLE CHEST - 1 VIEW  Comparison: 06/24/2012.  Findings: Patient is rotated to the  left.  There is a right upper extremity PICC.  The tip is in the mid to lower SVC, just inferior to the carina.  The bilateral basilar opacities present, probably representing atelectasis.  IMPRESSION: New right upper extremity PICC with the tip in the mid to lower SVC.  Basilar opacity most compatible with atelectasis. Technically suboptimal projection due to rotation.   Original Report Authenticated By: Andreas Newport, M.D.     Scheduled Meds: . acetaminophen  1,000 mg Oral TID  . cholecalciferol  1,000 Units Oral q morning - 10a  . clopidogrel  75 mg Oral Q breakfast  . enoxaparin (LOVENOX) injection  70 mg Subcutaneous Q12H  . imipenem-cilastatin  500 mg Intravenous Q8H  . insulin aspart  0-9 Units Subcutaneous Q8H  . lip balm  1 application Topical BID  . metoprolol tartrate  50 mg Oral BID  . multivitamin with minerals  1 tablet Oral q morning - 10a  . pantoprazole  40 mg Oral Daily  . potassium chloride  20 mEq Oral Daily  . psyllium  1 packet Oral BID  . saccharomyces boulardii  250 mg Oral BID  . tamsulosin  0.4 mg Oral QHS   Continuous Infusions: . Marland KitchenTPN (CLINIMIX-E) Adult     And  . fat emulsion    . lactated ringers 50 mL/hr at 07/07/12 0037  . lactated ringers      Principal Problem:   Closed left hip fracture Active Problems:   CVA   Dementia   Fracture of left humerus   Leucocytosis   Bacteremia due to Enterococcus   Bacteremia due to Klebsiella pneumoniae   Abnormal finding on GI tract imaging   Pancreatitis, acute   Dilated bile duct   Pancreatic necrosis   Time spent: 35 min  [facility]  [facility] Pager [phone]. If 7PM-7AM, please contact night-coverage at [email or web address], password [email or web address] 07/07/2012, 3:06 PM  LOS: 14 days

## 2012-07-07 NOTE — Progress Notes (Signed)
ATTENDING ADDENDUM:  I personally reviewed patient's record, examined the patient, and formulated the following assessment and plan:  Feels about the same.  WBC stable.  Cont TPN

## 2012-07-07 NOTE — Progress Notes (Signed)
Brief Pharmacy Note  S/O: Received call from IV team that current TNA bag had to be taken down to exchange SL PICC for DL PICC.   A/P: Due to sterility compromise, old TNA bag cannot be re-connected. New TNA bag and lipids made and resent.  Gwen Her PharmD  [phone] 07/07/2012 9:38 PM

## 2012-07-07 NOTE — Progress Notes (Signed)
NUTRITION FOLLOW UP  Intervention:   TPN per PharmD (see re-estimated nutritional needs below)   Nutrition Dx:   Inadequate oral intake related to pt refusing meals as evidenced by RN report; discontinued New Nutrition Dx: Inadequate oral intake related to necrotizing pancreatitis as evidenced by pt's restricted intake to clear liquids only with plans for TPN.   Goal:   Pt to meet >/= 90% of their estimated nutrition needs  Monitor:   TPN initiation and rate Wt Labs  Assessment:   From MD notes: 62 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 Developed bacteremia some days later and found on CT to have acute necrotizing pancreatitis.  US shows gallstones and mild biliary dilatation so gallstone etiology likely. 4/28: His repeat blood cultures have become negative on therapy for polymicrobial bacteremia related to pancreatitis. Would repeat imaging again in 6 weeks, and if no pseudocyst formation, would need cholecystectomy at that time. 4/29: Plan for PICC and TPN. "Patient will probably not be able to tolerate anything more than clears for weeks. Once to goal, can start to cycle to assist with his placement."  RD re-estimated nutritional needs related to necrotizing pancreatitis. PICC line was placed yesterday afternoon.    Height: Ht Readings from Last 1 Encounters:  06/24/12 5' 1.81" (1.57 m)    Weight Status:   Wt Readings from Last 1 Encounters:  07/06/12 147 lb 4.3 oz (66.8 kg)    Re-estimated needs:  Kcal: 2020-2340 Protein: 107-120 grams fluid: 2.2 L  Skin: non-pitting LLE edema; incision on left hip; ecchymosis on groin, hips, legs  Diet Order: Clear Liquid   Intake/Output Summary (Last 24 hours) at 07/07/12 1014 Last data filed at 07/07/12 0400  Gross per 24 hour  Intake   1580 ml  Output      0 ml  Net   1580 ml    Last BM: 4/29   Labs:   Recent Labs Lab 07/02/12 0530  07/03/12 0542 07/04/12 0510 07/06/12 0440  NA 135 135  --  133*  K 3.2* 3.3* 3.3* 3.4*  CL 101 101  --  99  CO2 26 24  --  26  BUN 16 15  --  7  CREATININE 0.63 0.53  --  0.57  CALCIUM 7.5* 7.4*  --  7.7*  MG  --   --  1.8 1.9  GLUCOSE 106* 99  --  89    CBG (last 3)  No results found for this basename: GLUCAP,  in the last 72 hours  Scheduled Meds: . acetaminophen  1,000 mg Oral TID  . cholecalciferol  1,000 Units Oral q morning - 10a  . clopidogrel  75 mg Oral Q breakfast  . enoxaparin (LOVENOX) injection  70 mg Subcutaneous Q12H  . imipenem-cilastatin  500 mg Intravenous Q8H  . lip balm  1 application Topical BID  . metoprolol tartrate  50 mg Oral BID  . multivitamin with minerals  1 tablet Oral q morning - 10a  . pantoprazole  40 mg Oral Daily  . potassium chloride  20 mEq Oral Daily  . psyllium  1 packet Oral BID  . saccharomyces boulardii  250 mg Oral BID  . tamsulosin  0.4 mg Oral QHS    Continuous Infusions: . lactated ringers 50 mL/hr at 07/07/12 0037    Ian Malkin RD, LDN Inpatient Clinical Dietitian Pager: [phone] After Hours Pager: [phone]

## 2012-07-07 NOTE — Progress Notes (Signed)
Peripherally Inserted Central Catheter/Midline Placement  The IV Nurse has discussed with the patient and/or persons authorized to consent for the patient, the purpose of this procedure and the potential benefits and risks involved with this procedure.  The benefits include less needle sticks, lab draws from the catheter and patient may be discharged home with the catheter.  Risks include, but not limited to, infection, bleeding, blood clot (thrombus formation), and puncture of an artery; nerve damage and irregular heat beat.  Alternatives to this procedure were also discussed. Exchange of SL PICC for DL PICC performed per MD order.    PICC/Midline Placement Documentation  PICC / Midline Double Lumen 07/07/12 PICC Right Brachial (Active)       Steven [name] 07/07/2012, 10:11 PM

## 2012-07-07 NOTE — Progress Notes (Signed)
Speech Language Pathology Discharge Patient Details Name: Steven [name] MRN: [id number] DOB: 1951/02/11 Today's Date: 07/07/2012 Time:  -     Patient discharged from SLP services secondary to pt not consuming po secondary to necrotizing pancreatitis, GI/surgery following, now for TPN and clears only- possibly for several months per note.    Please see latest therapy progress note for current level of functioning and progress toward goals.  Please reorder if desire.   Donavan Burnet, MS [facility] SLP [phone]    GO      07/07/2012, 4:09 PM

## 2012-07-08 LAB — MAGNESIUM: Magnesium: 1.8 mg/dL (ref 1.5–2.5)

## 2012-07-08 LAB — COMPREHENSIVE METABOLIC PANEL
ALT: 16 U/L (ref 0–53)
BUN: 5 mg/dL — ABNORMAL LOW (ref 6–23)
CO2: 29 mEq/L (ref 19–32)
Calcium: 7.6 mg/dL — ABNORMAL LOW (ref 8.4–10.5)
Creatinine, Ser: 0.54 mg/dL (ref 0.50–1.35)
GFR calc Af Amer: >90 mL/min (ref >90)
GFR calc non Af Amer: >90 mL/min (ref >90)
Glucose, Bld: 146 mg/dL — ABNORMAL HIGH (ref 70–99)
Sodium: 133 mEq/L — ABNORMAL LOW (ref 135–145)
Total Protein: 5.3 g/dL — ABNORMAL LOW (ref 6.0–8.3)

## 2012-07-08 LAB — DIFFERENTIAL
Basophils Relative: 0 % (ref 0–1)
Eosinophils Relative: 2 % (ref 0–5)
Monocytes Absolute: 0.6 10*3/uL (ref 0.1–1.0)
Monocytes Relative: 9 % (ref 3–12)
Neutro Abs: 4.7 10*3/uL (ref 1.7–7.7)

## 2012-07-08 LAB — CBC
HCT: 25.7 % — ABNORMAL LOW (ref 39.0–52.0)
Hemoglobin: 8.7 g/dL — ABNORMAL LOW (ref 13.0–17.0)
MCH: 28 pg (ref 26.0–34.0)
MCHC: 33.9 g/dL (ref 30.0–36.0)
MCV: 82.6 fL (ref 78.0–100.0)
Platelets: 414 10*3/uL — ABNORMAL HIGH (ref 150–400)
RBC: 3.11 MIL/uL — ABNORMAL LOW (ref 4.22–5.81)
RDW: 13.6 % (ref 11.5–15.5)
WBC: 7 10*3/uL (ref 4.0–10.5)

## 2012-07-08 LAB — CHOLESTEROL, TOTAL: Cholesterol: 89 mg/dL (ref 0–200)

## 2012-07-08 LAB — CANCER ANTIGEN 19-9: CA 19-9: 3.3 U/mL — ABNORMAL LOW (ref <35.0)

## 2012-07-08 LAB — PHOSPHORUS: Phosphorus: 3.2 mg/dL (ref 2.3–4.6)

## 2012-07-08 LAB — GLUCOSE, CAPILLARY: Glucose-Capillary: 148 mg/dL — ABNORMAL HIGH (ref 70–99)

## 2012-07-08 LAB — PREALBUMIN: Prealbumin: 6.8 mg/dL — ABNORMAL LOW (ref 17.0–34.0)

## 2012-07-08 MED ORDER — POTASSIUM CHLORIDE 10 MEQ/50ML IV SOLN
10.0000 meq | INTRAVENOUS | Status: AC
  Administered 2012-07-08 (×5): 10 meq via INTRAVENOUS
  Filled 2012-07-08 (×5): qty 50

## 2012-07-08 MED ORDER — CLINIMIX E/DEXTROSE (5/20) 5 % IV SOLN
INTRAVENOUS | Status: AC
  Administered 2012-07-08: 18:00:00 via INTRAVENOUS
  Filled 2012-07-08: qty 2000

## 2012-07-08 NOTE — Progress Notes (Signed)
ANTIBIOTIC CONSULT NOTE - FOLLOW UP  Pharmacy Consult for Primaxin Indication: polymicrobial bacteremia, ?infected necrotizing pancreatitis  Allergies  Allergen Reactions  . Cefazolin Rash  . Penicillins     REACTION: unknown allergy - hx since childhood    Patient Measurements: Height: 5' 1.81" (157 cm) Weight: 147 lb 4.3 oz (66.8 kg) IBW/kg (Calculated) : 54.17  Vital Signs: Temp: 98.5 F (36.9 C) (05/01 0512) Temp src: Oral (05/01 0512) BP: 119/72 mmHg (05/01 0512) Pulse Rate: 63 (05/01 0512) Intake/Output from previous day: 04/30 0701 - 05/01 0700 In: 1781.2 [P.O.:130; I.V.:867.2; IV Piggyback:300; TPN:484] Out: 250 [Urine:250] Intake/Output from this shift: Total I/O In: 80 [P.O.:80] Out: -   Labs:  Recent Labs  07/06/12 0440 07/07/12 0500 07/08/12 0625  WBC 8.8 7.2 7.0  HGB 9.0* 8.3* 8.7*  PLT 373 357 414*  CREATININE 0.57  --  0.54   Estimated Creatinine Clearance: 81.2 ml/min (by C-G formula based on Cr of 0.54). No results found for this basename: Rolm Gala, VANCORANDOM, GENTTROUGH, GENTPEAK, GENTRANDOM, TOBRATROUGH, TOBRAPEAK, TOBRARND, AMIKACINPEAK, AMIKACINTROU, AMIKACIN,  in the last 72 hours    Assessment:   62 yo male s/p unwitnessed fall at assisted living facility, admitted 4/17.  Chest CT with possible PNA, abx initially started as outline below for HCAP then narrowed to now only Primaxin for enterococcus/klebsiella bacteremia and likely infected necrotizing pancreatitis.  Infectious Disease is on board. Patient is afebrile, WBC wnl, Scr wnl for CG 81 and normalized CrCl of 98 ml/min. ID recommended at least 2 more weeks of Primaxin starting from 4/29.   Antibiotics this admission 4/17 >> Vancomycin >> 4/20 4/17 >> Levofloxacin >> 4/20 4/17 >> Aztreonam >> 4/20 4/20 >> Metronidazole po >> 4/20  4/20 >> Primaxin >>   Microbiology this admit  4/17 Blood >> 2/2 Enterococcus, 1/2 Klebsiella  4/17 mrsa pcr >> negative 4/20 blood  x2 >> NGF 4/22 Cdiff >> negative 4/22 Ucx >> NGF   Plan:   Continue Primaxin 500 mg IV q8h as ordered   Pharmacy will sign off from further formal notes for Primaxin consult given stable/good renal function  Pharmacy will continue to follow peripherally  Geoffry Paradise, PharmD, BCPS Pager: [phone] 10:22 AM Pharmacy #: [phone]

## 2012-07-08 NOTE — Progress Notes (Signed)
Physical Therapy Treatment Patient Details Name: Steven [name] MRN: [id number] DOB: 1951/02/12 Today's Date: 07/08/2012 Time: 4696-2952 PT Time Calculation (min): 17 min  PT Assessment / Plan / Recommendation Comments on Treatment Session  Pancreatits and POD #13 L ORIF 2nd fall.fx. 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 Assisted pt OOB to [facility] then to chair. Pt progressing with transfers slowly and still requiring +2 total assist. Pt plans to D/C to SNF if [facility] is unable to provide appropriate care.  Will consult LPT about updating timeframe for goal achievement. PT to see next Tx.    Follow Up Recommendations  SNF     Equipment Recommendations  None recommended by PT    Frequency Min 3X/week   Plan Discharge plan remains appropriate    Precautions / Restrictions Precautions Precautions: Fall Precaution Comments: L hemiparesis s/p CVA and L chronic humerus fx Restrictions Weight Bearing Restrictions: Yes LLE Weight Bearing: Partial weight bearing LLE Partial Weight Bearing Percentage or Pounds: 50%   Pertinent Vitals/Pain Pt is unable to rate his L hip pain but states that it is present    Mobility  Bed Mobility Bed Mobility: Supine to Sit Supine to Sit: 1: +2 Total assist Supine to Sit: Patient Percentage: 60% Details for Bed Mobility Assistance: HOB elevated 45' and use of pad to swival hips around  Transfers Transfers: Stand Pivot Transfers Sit to Stand: 1: +2 Total assist Sit to Stand: Patient Percentage: 40% Stand to Sit: 1: +2 Total assist Stand to Sit: Patient Percentage: 40% Stand Pivot Transfers: 1: +1 Total assist Stand Pivot Transfers: Patient Percentage: 20% Details for Transfer Assistance: Using "Bear Hug" stand pivot to pt's R from bed to recliner.  Ambulation/Gait Ambulation/Gait Assistance: Not tested (comment)         PT Goals Acute Rehab PT Goals PT Goal Formulation: Patient unable to participate in goal setting Potential to Achieve Goals: Fair Pt  will go Supine/Side to Sit: with mod assist PT Goal: Supine/Side to Sit - Progress: Progressing toward goal Pt will Sit at [facility] of Bed: with supervision;3-5 min;with no upper extremity support PT Goal: Sit at Edge Of Bed - Progress: Progressing toward goal Pt will go Sit to Stand: with mod assist PT Goal: Sit to Stand - Progress: Progressing toward goal Pt will go Stand to Sit: with mod assist PT Goal: Stand to Sit - Progress: Progressing toward goal  Visit Information  Last PT Received On: 07/08/12 Assistance Needed: +2    Cognition    Fair-   Balance    Fair -  End of Session PT - End of Session Equipment Utilized During Treatment: Gait belt Activity Tolerance: Patient tolerated treatment well Patient left: in chair;with call bell/phone within reach   GP     BROWN-SMEDLEY, NICOLE 07/08/2012, 3:18 PM   [name]  PTA [facility]  Acute  Rehab Pager      [phone]

## 2012-07-08 NOTE — Progress Notes (Signed)
    [facility]  Date of Admission:  06/23/2012  Antibiotics: Imipenem day 11 Total antibiotics day 16  Subjective: "feels like hell"  Objective: Temp:  [98.3 F (36.8 C)-98.5 F (36.9 C)] 98.5 F (36.9 C) (05/01 0512) Pulse Rate:  [63-102] 63 (05/01 0512) Resp:  [19-20] 20 (05/01 0512) BP: (113-119)/(72-79) 119/72 mmHg (05/01 0512) SpO2:  [93 %-96 %] 93 % (05/01 0512)  General: Awake, alert, nad Skin: no rashes Lungs: CTA B Ext: no edema  Lab Results Lab Results  Component Value Date   WBC 7.0 07/08/2012   HGB 8.7* 07/08/2012   HCT 25.7* 07/08/2012   MCV 82.6 07/08/2012   PLT 414* 07/08/2012    Lab Results  Component Value Date   CREATININE 0.54 07/08/2012   BUN 5* 07/08/2012   NA 133* 07/08/2012   K 3.1* 07/08/2012   CL 97 07/08/2012   CO2 29 07/08/2012    Lab Results  Component Value Date   ALT 16 07/08/2012   AST 19 07/08/2012   ALKPHOS 131* 07/08/2012   BILITOT 0.6 07/08/2012      Microbiology: Recent Results (from the past 240 hour(s))  URINE CULTURE     Status: None   Collection Time    06/29/12  6:20 AM      Result Value Range Status   Specimen Description URINE, RANDOM   Final   Special Requests Immunocompromised   Final   Culture  Setup Time 06/29/2012 08:38   Final   Colony Count NO GROWTH   Final   Culture NO GROWTH   Final   Report Status 06/30/2012 FINAL   Final  CLOSTRIDIUM DIFFICILE BY PCR     Status: None   Collection Time    06/29/12  4:45 PM      Result Value Range Status   C difficile by pcr NEGATIVE  NEGATIVE Final    Studies/Results: Dg Chest Port 1 View  07/06/2012  *RADIOLOGY REPORT*  Clinical Data: Line placement.  PORTABLE CHEST - 1 VIEW  Comparison: 06/24/2012.  Findings: Patient is rotated to the left.  There is a right upper extremity PICC.  The tip is in the mid to lower SVC, just inferior to the carina.  The bilateral basilar opacities present, probably representing atelectasis.  IMPRESSION: New right upper extremity  PICC with the tip in the mid to lower SVC.  Basilar opacity most compatible with atelectasis. Technically suboptimal projection due to rotation.   Original Report Authenticated By: Andreas Newport, M.D.     Assessment/Plan: 1) pancreatic necrosis, polymicrobial bacteremia - Continues to be clinically stable.  WBC stable at 7,000 today and on bowel rest with TNA.   -Continue imipenem for 2 weeks at least and reevaluate clinically and with CT prior to considering stopping antibiotics  Staci Righter, MD [facility] [phone] pager   07/08/2012, 1:10 PM

## 2012-07-08 NOTE — Progress Notes (Signed)
ATTENDING ADDENDUM:  I personally reviewed patient's record, examined the patient, and formulated the following assessment and plan:  Exam unchanged.  WBC stable.  Will probably need to be on TPN for 3-4 weeks.

## 2012-07-08 NOTE — Progress Notes (Signed)
PARENTERAL NUTRITION CONSULT NOTE - Follow Up  Pharmacy Consult for TNA Indication: severe pancreatitis  Allergies  Allergen Reactions  . Cefazolin Rash  . Penicillins     REACTION: unknown allergy - hx since childhood    Patient Measurements: Height: 5' 1.81" (157 cm) Weight: 147 lb 4.3 oz (66.8 kg) IBW/kg (Calculated) : 54.17 Adjusted Body Weight: 58 kg  Vital Signs: Temp: 98.5 F (36.9 C) (05/01 0512) Temp src: Oral (05/01 0512) BP: 119/72 mmHg (05/01 0512) Pulse Rate: 63 (05/01 0512) Intake/Output from previous day: 04/30 0701 - 05/01 0700 In: 1781.2 [P.O.:130; I.V.:867.2; IV Piggyback:300; TPN:484] Out: 250 [Urine:250] Intake/Output from this shift: Total I/O In: 80 [P.O.:80] Out: -   Labs:  Recent Labs  07/06/12 0440 07/07/12 0500 07/08/12 0625  WBC 8.8 7.2 7.0  HGB 9.0* 8.3* 8.7*  HCT 26.6* 25.6* 25.7*  PLT 373 357 414*     Recent Labs  07/06/12 0440 07/08/12 0625  NA 133* 133*  K 3.4* 3.1*  CL 99 97  CO2 26 29  GLUCOSE 89 146*  BUN 7 5*  CREATININE 0.57 0.54  CALCIUM 7.7* 7.6*  MG 1.9 1.8  PHOS  --  3.2  PROT 5.2* 5.3*  ALBUMIN 1.9* 1.9*  AST 28 19  ALT 24 16  ALKPHOS 176* 131*  BILITOT 0.7 0.6  TRIG  --  171*  CHOL  --  89   Estimated Creatinine Clearance: 81.2 ml/min (by C-G formula based on Cr of 0.54).    Recent Labs  07/07/12 1556 07/08/12 0017  GLUCAP 88 105*    Assessment:  61 yom with cecrotizing pancreatitis, [place] abd with gallstones. Surgery on board, no need for acute cholecystectomy. Plan repeat imaging in 6 weeks and plan for cholecystectomy at that time if there is no pseudocyst formation. Surgery would like to start TNA since patient will not be able to tolerate anything more than clears for weeks. PICC placed 4/29, TNA started 4/30.  Per Surgery notes, plan to cycle TNA once at goal to assist with placement.   Nutritional Goals:  - RD recommendations: pending - Clinimix E 5/20 @ 70 + IVF 20% at 10 ml/hr on MWF  will provide: 84 g protein, average of 1684 Kcal per day.  Current nutrition:  - Diet: Clear liquid starting 4/26 - TNA: Clinimix E 5/20 @ 40 ml/hr - mIVF: LR @ KVO  CBGs & Insulin requirements past 24 hours:  - CBGs < 150, 1 unit of Novolog SSI  Labs: Electrolytes: K+ 3.1, Mag/Phos wnl Renal Function: Scr wnl/stable Hepatic Function: Alk phos elevated 4/29 @ 176 (prior to TNA initiation) - improving to 131 Pre-Albumin: pending 5/1 Triglycerides: slightly elevated at 171 on 5/1 CBGs: controlled on sensitive SSI, no h/o DM   Plan:  At 1800 tonight  Advance TNA to  Clinimix E 5/20 @ 60 ml/hr  TNA to contain IV fat emulsion 20% at 10 ml/hr on MWF only due to ongoing shortage  Patient is tolerating oral multivitamin tablet so will not add MV and trace elements to TNA.  Continue IVF at [facility].  Replace K+ with 5 runs of KCl  Continue sensitive SSI q8h  TNA labs Monday/Thursdays  Dietician consult - pending  Pharmacy will follow up daily  Geoffry Paradise, PharmD, BCPS Pager: [phone] 10:08 AM Pharmacy #: [id number]

## 2012-07-08 NOTE — Progress Notes (Signed)
[facility] PROGRESS NOTE  CONO GEBHARD WUJ:[id number] DOB: [birth year] DOA: 06/23/2012 PCP: Lehman Prom, NP  Interval history 62 year old male patient, nursing home resident, PMH of CAD, dementia, CVA admitted on 06/24/12 after an unwitnessed fall. In the ED, x-rays revealed left hip fracture. He does have chronic left humerus fracture. Patient nonambulatory per family. He probably has dementia. Orthopedics was consulted and patient underwent ORIF of left hip fracture on 4/18. Patient had sinus tachycardia early during hospitalization. CTA chest negative for PE. TSH normal. Patient was found to have polymicrobial bacteremia due to enterococcus and Klebsiella. He was treated with broad-spectrum IV antibiotics-aztreonam, vancomycin and Levaquin for presumed healthcare acquired pneumonia. Infectious disease was consulted. CT abdomen showed necrotizing pancreatitis/abscess in the area of head of pancreas-likely source of bacteremia. A Gastrografin study did not show any bowel perforation. His antibiotics were narrowed to imipenem. Despite this, patient continued to have fevers and worsening leukocytosis. Surgeons were consulted. Patient has clinically improved. Surgeons, GI and ID continue to follow patient. Current plan is for n.p.o. except sips of clear liquids, TPN and prolonged antibiotics. 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 He eventually we'll need cholecystectomy. Etiology of pancreatitis-chronic alcoholic pancreatitis versus rule out malignancy or an diagnosed ulcer.  Assessment/Plan:                                                                                                             Necrotizing pancreatitis - fever and leukocytosis resolved - surgery following- no surgical planned at this time. - US abdomen show gallstones which may suggest an etiology. Per surgery, no need for acute cholecystectomy, repeat imaging in 6 weeks ad plan for  cholecystectomy at that time if there is no pseudocyst formation. - Continue PICC, TPN and sips of clear liquids and IV antibiotics. 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 Plan discussed with GI as well.  Started on full dose Lovenox.  Polymicrobial bacteremia/Leukocytosis - C diff negative. patient is afebrile. Chest x-ray and UA are unremarkable except the chest x-ray showing mild congestion. He was found to have polymicrobial bacteremia with Positive Blood cultures showing enterococcus and klebsiella. ID consulted for further recommendations. CT abd and pelvis was done and showed necrotizing pancreatitis. Started him on primaxin. - ID following, appreciate input - continue antibiotics, appreciate ID input regarding total length.  - Has PICC line. Continue Primaxin  Left femoral intertrochanteric fracture -  - Underwent ORIF of the left femur on 4/18. PT/OT evaluation, plan for SNF placement. Pain control. Outpatient followup with orthopedics in 2 weeks.  Sinus tachycardia - May be pain, related due to dehydration or most likely due to #1.  CT angiogram of the chest to rule out PE was negative. Free t4 and TSH within normal limits. Echocardiogram showed good LVEF without any wall abn.  His tachycardia improved and he is on b blockers. Stable, discontinue telemetry. Likely secondary to bacteremia.  History of CVA with left-sided hemiparesis - initially plavix held  For surgery of the left hip. Back on Plavix, may need to be stopped again if planning chole.   Dementia - no acute issues.  Chronic left humerus fracture. no acute issues.   Hypokalemia; replete as needed   Anemia: Likely multifactorial-chronic disease, acute illness and  phlebotomies. Follow CBC name. Transfuse if hemoglobin less than 7 g/dL  DVT prophylaxis on lovenox.   Code Status: full code Family Communication: none Disposition Plan: SNF when medically stable  PROCEDURE:   Open reduction and internal fixation of the left comminuted intertrochanteric femur fracture on 4/18  PICC line  Consultants:  Cardiology for tachycardic  Orthopedics   ID Dr Orvan Falconer.  GI Dr. Arlyce Dice  General surgery  HPI/Subjective: Poor historian. Indicates that he feels lousy. Did not complain of pain this morning.  Objective: Filed Vitals:   07/07/12 2208 07/08/12 0000 07/08/12 0400 07/08/12 0512  BP: 116/79   119/72  Pulse: 102   63  Temp: 98.3 F (36.8 C)   98.5 F (36.9 C)  TempSrc: Oral   Oral  Resp: 19 20 20 20   Height:      Weight:      SpO2: 93% 94% 93% 93%    Intake/Output Summary (Last 24 hours) at 07/08/12 0718 Last data filed at 07/08/12 0600  Gross per 24 hour  Intake 1781.18 ml  Output    250 ml  Net 1531.18 ml   Filed Weights   06/24/12 0837 07/05/12 0705 07/06/12 0500  Weight: 64.411 kg (142 lb) 64.819 kg (142 lb 14.4 oz) 66.8 kg (147 lb 4.3 oz)    Exam: General: Patient lying comfortably in bed without distress. Was being bathed this morning.  Cardiovascular: S1-S2, RRR. No JVD, murmurs or pedal edema. Respiratory: Reduced breath sounds in the bases but otherwise clear to auscultation. No increased work of breathing. Abdomen: Nondistended and soft. Mild epigastric tenderness but without rigidity, guarding or rebound. Musculoskeletal: no edema Neurologic: Alert and oriented to self and place. No new focal deficits. Chronic left hemiparesis  Data Reviewed: Basic Metabolic Panel:  Recent Labs Lab 07/02/12 0530 07/03/12 0542 07/04/12 0510 07/06/12 0440 07/08/12 0625  NA 135 135  --  133* 133*  K 3.2* 3.3* 3.3* 3.4* 3.1*  CL 101 101  --  99 97  CO2 26 24  --  26 29  GLUCOSE 106* 99  --  89 146*  BUN 16 15  --  7 5*   CREATININE 0.63 0.53  --  0.57 0.54  CALCIUM 7.5* 7.4*  --  7.7* 7.6*  MG  --   --  1.8 1.9 1.8  PHOS  --   --   --   --  3.2   Liver Function Tests:  Recent Labs Lab 07/06/12 0440 07/08/12 0625  AST 28 19  ALT 24 16  ALKPHOS 176* 131*  BILITOT 0.7 0.6  PROT 5.2* 5.3*  ALBUMIN 1.9* 1.9*    Recent Labs Lab 07/04/12 0510  LIPASE 38   CBC:  Recent Labs Lab 07/04/12 0510 07/05/12 0418 07/06/12 0440 07/07/12 0500 07/08/12 0625  WBC 11.3* 9.0 8.8 7.2 7.0  NEUTROABS  --   --   --   --  4.7  HGB 9.4* 9.4* 9.0* 8.3* 8.7*  HCT 27.3* 27.2* 26.6* 25.6* 25.7*  MCV 82.7 82.7 83.1 83.4 82.6  PLT 363 384 373 357 414*   BNP (last 3 results)  Recent Labs  06/24/12 0405 06/24/12 0551  PROBNP 1874.0* 1830.0*   Recent Results (from the past 240 hour(s))  URINE CULTURE     Status: None   Collection Time    06/29/12  6:20 AM      Result Value Range Status   Specimen Description URINE, RANDOM   Final   Special Requests Immunocompromised   Final   Culture  Setup Time 06/29/2012 08:38   Final   Colony Count NO GROWTH   Final   Culture NO GROWTH   Final   Report Status 06/30/2012 FINAL   Final  CLOSTRIDIUM DIFFICILE BY PCR     Status: None   Collection Time    06/29/12  4:45 PM      Result Value Range Status   C difficile by pcr NEGATIVE  NEGATIVE Final     Studies: Dg Chest Port 1 View  07/06/2012  *RADIOLOGY REPORT*  Clinical Data: Line placement.  PORTABLE CHEST - 1 VIEW  Comparison: 06/24/2012.  Findings: Patient is rotated to the left.  There is a right upper extremity PICC.  The tip is in the mid to lower SVC, just inferior to the carina.  The bilateral basilar opacities present, probably representing atelectasis.  IMPRESSION: New right upper extremity PICC with the tip in the mid to lower SVC.  Basilar opacity most compatible with atelectasis. Technically suboptimal projection due to rotation.   Original Report Authenticated By: Andreas Newport, M.D.     Scheduled  Meds: . acetaminophen  1,000 mg Oral TID  . cholecalciferol  1,000 Units Oral q morning - 10a  . clopidogrel  75 mg Oral Q breakfast  . enoxaparin (LOVENOX) injection  70 mg Subcutaneous Q12H  . imipenem-cilastatin  500 mg Intravenous Q8H  . insulin aspart  0-9 Units Subcutaneous Q8H  . lip balm  1 application Topical BID  . metoprolol tartrate  50 mg Oral BID  . multivitamin with minerals  1 tablet Oral q morning - 10a  . pantoprazole  40 mg Oral Daily  . potassium chloride  20 mEq Oral Daily  . psyllium  1 packet Oral BID  . saccharomyces boulardii  250 mg Oral BID  . tamsulosin  0.4 mg Oral QHS   Continuous Infusions: . Marland KitchenTPN (CLINIMIX-E) Adult 40 mL/hr at 07/08/12 0026   And  . fat emulsion 240 mL (07/08/12 0026)  . lactated ringers 20 mL/hr at 07/07/12 2336    Principal Problem:   Closed left hip fracture Active Problems:   CVA   Dementia   Fracture of left humerus   Leucocytosis   Bacteremia due to Enterococcus   Bacteremia due to Klebsiella pneumoniae   Abnormal finding on GI tract imaging   Pancreatitis, acute   Dilated bile duct   Pancreatic necrosis   Superior mesenteric vein thrombosis   Time spent: 35 min  [facility]  [facility] Pager [phone]. If 7PM-7AM, please contact night-coverage at [email or web address], password [facility] 07/08/2012, 7:18 AM  LOS: 15 days

## 2012-07-08 NOTE — Progress Notes (Signed)
13 Days Post-Op  Subjective: "I feel like hell."  Says his chest hurts, but he hurts everywhere you touch him.   Objective: Vital signs in last 24 hours: Temp:  [98.3 F (36.8 C)-98.5 F (36.9 C)] 98.5 F (36.9 C) (05/01 0512) Pulse Rate:  [63-102] 63 (05/01 0512) Resp:  [19-20] 20 (05/01 0512) BP: (113-119)/(72-79) 119/72 mmHg (05/01 0512) SpO2:  [93 %-96 %] 93 % (05/01 0512) Last BM Date: 07/06/12  PO 130 ml recorded. On TNA Last CT 4/28 Afebrile, VSS K+ 3.1;  WBC is 7.0 Intake/Output from previous day: 04/30 0701 - 05/01 0700 In: 1781.2 [P.O.:130; I.V.:867.2; IV Piggyback:300; TPN:484] Out: 250 [Urine:250] Intake/Output this shift:    General appearance: alert, appears older than stated age, cachectic and confused, but no distress. GI: he says he hurts anyplace you touch him.  He does not appear to be in any distress.  Lab Results:   Recent Labs  07/07/12 0500 07/08/12 0625  WBC 7.2 7.0  HGB 8.3* 8.7*  HCT 25.6* 25.7*  PLT 357 414*    BMET  Recent Labs  07/06/12 0440 07/08/12 0625  NA 133* 133*  K 3.4* 3.1*  CL 99 97  CO2 26 29  GLUCOSE 89 146*  BUN 7 5*  CREATININE 0.57 0.54  CALCIUM 7.7* 7.6*   PT/INR No results found for this basename: LABPROT, INR,  in the last 72 hours   Recent Labs Lab 07/06/12 0440 07/08/12 0625  AST 28 19  ALT 24 16  ALKPHOS 176* 131*  BILITOT 0.7 0.6  PROT 5.2* 5.3*  ALBUMIN 1.9* 1.9*     Lipase     Component Value Date/Time   LIPASE 38 07/04/2012 0510     Studies/Results: Dg Chest Port 1 View  07/06/2012  *RADIOLOGY REPORT*  Clinical Data: Line placement.  PORTABLE CHEST - 1 VIEW  Comparison: 06/24/2012.  Findings: Patient is rotated to the left.  There is a right upper extremity PICC.  The tip is in the mid to lower SVC, just inferior to the carina.  The bilateral basilar opacities present, probably representing atelectasis.  IMPRESSION: New right upper extremity PICC with the tip in the mid to lower SVC.   Basilar opacity most compatible with atelectasis. Technically suboptimal projection due to rotation.   Original Report Authenticated By: Andreas Newport, M.D.     Medications: . acetaminophen  1,000 mg Oral TID  . cholecalciferol  1,000 Units Oral q morning - 10a  . clopidogrel  75 mg Oral Q breakfast  . enoxaparin (LOVENOX) injection  70 mg Subcutaneous Q12H  . imipenem-cilastatin  500 mg Intravenous Q8H  . insulin aspart  0-9 Units Subcutaneous Q8H  . lip balm  1 application Topical BID  . metoprolol tartrate  50 mg Oral BID  . multivitamin with minerals  1 tablet Oral q morning - 10a  . pantoprazole  40 mg Oral Daily  . potassium chloride  20 mEq Oral Daily  . psyllium  1 packet Oral BID  . saccharomyces boulardii  250 mg Oral BID  . tamsulosin  0.4 mg Oral QHS    Assessment/Plan . Acute necrotizing pancreatitis- US shows gallstones and mild biliary dilatation so gallstone etiology likely.  Repeat CT still show acute pancreatitis, pancreatic necroisis at The head and tail of the pancreas.necrosis or abscess.  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   2. Positive bacteremia of uncertain etiology. Been followed by ID.  3. Prior CVA,  currently on Plavix. Left hemiparesis.  4. Significant dementia; currently resides at a skilled nursing facility.  5. Prior history of alcohol and substance abuse.  6. Fall with left hip ORIF 4/17 Dr. Charlann Boxer  7. Urinary incontinence/hypertonicity of the bladder.  8. Prior fracture of the left humerus, with history of multiple trauma/crush fracture.  9. Lumbar spondylitis   Plan:  Currently on TNA, some clears, Afebrile, with normal WBC.  Continue TNA and rest his pancrease.  I will check with DR. Maisie Fus about when to plan to repeat CT and discuss advancing his diet. Medicine is hoping to send him back to SNF and need to know follow up plan after he returns to SNF.     LOS: 15 days    Celine Dishman 07/08/2012

## 2012-07-09 LAB — BASIC METABOLIC PANEL
BUN: 7 mg/dL (ref 6–23)
CO2: 25 mEq/L (ref 19–32)
Calcium: 7.8 mg/dL — ABNORMAL LOW (ref 8.4–10.5)
Chloride: 100 mEq/L (ref 96–112)
Creatinine, Ser: 0.53 mg/dL (ref 0.50–1.35)
GFR calc Af Amer: >90 mL/min (ref >90)
GFR calc non Af Amer: >90 mL/min (ref >90)
Glucose, Bld: 149 mg/dL — ABNORMAL HIGH (ref 70–99)
Potassium: 3.4 mEq/L — ABNORMAL LOW (ref 3.5–5.1)
Sodium: 133 mEq/L — ABNORMAL LOW (ref 135–145)

## 2012-07-09 LAB — CBC
HCT: 26.4 % — ABNORMAL LOW (ref 39.0–52.0)
Hemoglobin: 8.5 g/dL — ABNORMAL LOW (ref 13.0–17.0)
MCHC: 32.2 g/dL (ref 30.0–36.0)

## 2012-07-09 LAB — GLUCOSE, CAPILLARY
Glucose-Capillary: 131 mg/dL — ABNORMAL HIGH (ref 70–99)
Glucose-Capillary: 146 mg/dL — ABNORMAL HIGH (ref 70–99)
Glucose-Capillary: 160 mg/dL — ABNORMAL HIGH (ref 70–99)

## 2012-07-09 MED ORDER — ENOXAPARIN SODIUM 100 MG/ML ~~LOC~~ SOLN
100.0000 mg | SUBCUTANEOUS | Status: DC
  Administered 2012-07-12 – 2012-07-14 (×3): 100 mg via SUBCUTANEOUS
  Filled 2012-07-09 (×7): qty 1

## 2012-07-09 MED ORDER — POTASSIUM CHLORIDE 10 MEQ/50ML IV SOLN
10.0000 meq | INTRAVENOUS | Status: AC
  Administered 2012-07-09 (×4): 10 meq via INTRAVENOUS
  Filled 2012-07-09 (×4): qty 50

## 2012-07-09 MED ORDER — FAT EMULSION 20 % IV EMUL
240.0000 mL | INTRAVENOUS | Status: AC
  Administered 2012-07-09: 240 mL via INTRAVENOUS
  Filled 2012-07-09: qty 250

## 2012-07-09 MED ORDER — TRACE MINERALS CR-CU-F-FE-I-MN-MO-SE-ZN IV SOLN
INTRAVENOUS | Status: AC
  Administered 2012-07-09: 18:00:00 via INTRAVENOUS
  Filled 2012-07-09: qty 2000

## 2012-07-09 NOTE — Progress Notes (Signed)
ATTENDING ADDENDUM:  I personally reviewed patient's record, examined the patient, and formulated the following assessment and plan:  Pt about the same.  Cont TPN.  Ok for SNF when available.  Would probably repeat CT in 3-4 weeks.

## 2012-07-09 NOTE — Progress Notes (Signed)
PARENTERAL NUTRITION CONSULT NOTE - Follow Up  Pharmacy Consult for TNA Indication: severe pancreatitis  Allergies  Allergen Reactions  . Cefazolin Rash  . Penicillins     REACTION: unknown allergy - hx since childhood    Patient Measurements: Height: 5' 1.81" (157 cm) Weight: 147 lb 4.3 oz (66.8 kg) IBW/kg (Calculated) : 54.17 Adjusted Body Weight: 58 kg  Vital Signs: Temp: 98.7 F (37.1 C) (05/02 0707) Temp src: Oral (05/02 0707) BP: 116/73 mmHg (05/02 0707) Pulse Rate: 103 (05/02 0707) Intake/Output from previous day: 05/01 0701 - 05/02 0700 In: 1075 [P.O.:160; IV Piggyback:200; TPN:715] Out: 1023 [Urine:1023] Intake/Output from this shift:    Labs:  Recent Labs  07/07/12 0500 07/08/12 0625 07/09/12 0440  WBC 7.2 7.0 7.4  HGB 8.3* 8.7* 8.5*  HCT 25.6* 25.7* 26.4*  PLT 357 414* 443*     Recent Labs  07/08/12 0625 07/09/12 0440  NA 133* 133*  K 3.1* 3.4*  CL 97 100  CO2 29 25  GLUCOSE 146* 149*  BUN 5* 7  CREATININE 0.54 0.53  CALCIUM 7.6* 7.8*  MG 1.8  --   PHOS 3.2  --   PROT 5.3*  --   ALBUMIN 1.9*  --   AST 19  --   ALT 16  --   ALKPHOS 131*  --   BILITOT 0.6  --   PREALBUMIN 6.8*  --   TRIG 171*  --   CHOL 89  --    Estimated Creatinine Clearance: 81.2 ml/min (by C-G formula based on Cr of 0.53).    Recent Labs  07/08/12 1647 07/09/12 0005 07/09/12 0844  GLUCAP 148* 146* 160*    Assessment:  61 yom with cecrotizing pancreatitis, [place] abd with gallstones. Surgery on board, no need for acute cholecystectomy. Plan repeat imaging in 6 weeks and plan for cholecystectomy at that time if there is no pseudocyst formation. Surgery would like to start TNA since patient will not be able to tolerate anything more than clears for weeks. PICC placed 4/29, TNA started 4/30.  Per Surgery notes, plan to cycle TNA once at goal to assist with placement.  5/2: D#3 TNA - MD plans TNA for 3-4 weeks upon discharge before starting PO.  Awaiting bed for  SNF.  Nutritional Goals:  - RD recs 4/30: 2020 - 2340 Kcal, 107-120 grams protein, 2.2 L fluid - Clinimix E 5/20 @ 90 ml/hr + IVF 20% at 10 ml/hr on MWF will provide: 108 g protein, average of 2107 Kcal per day.  Current nutrition:  - Diet: Clear liquid starting 4/26 - TNA: Clinimix E 5/20 @ 60 ml/hr - mIVF: LR @ KVO  CBGs & Insulin requirements past 24 hours:  - CBGs < 150, 5 unit of Novolog SSI  Labs: Electrolytes: K+ 3.4, Mag/Phos wnl on 5/1 Renal Function: Scr wnl/stable Hepatic Function: Alk phos elevated 4/29 @ 176 (prior to TNA initiation) - improving to 131 Pre-Albumin: 6.8 on 5/1 Triglycerides: slightly elevated at 171 on 5/1 CBGs: controlled on sensitive SSI, no h/o DM   Plan:  At 1800 tonight  Advance TNA to  Clinimix E 5/20 @ 83 ml/hr  TNA to contain IV fat emulsion 20% at 10 ml/hr on MWF only due to ongoing shortage  Patient has order for oral multivitamin tablet but last dose 4/30, refused dose on 5/1 so pharmacy will add MV and trace element to TNA tonight.  Discontinue MV from [facility].   Continue IVF at [facility].  Replace K+  with 4 runs of KCl  Continue sensitive SSI q8h  TNA labs Monday/Thursdays  Pharmacy will follow up daily.  If patient still here, consult with surgery if would like to cycle TNA - if not, could be done outpatient.  Geoffry Paradise, PharmD, BCPS Pager: [phone] 10:12 AM Pharmacy #: [id number]

## 2012-07-09 NOTE — Progress Notes (Signed)
ANTICOAGULATION CONSULT NOTE - Follow Up Consult  Pharmacy Consult for Lovenox Indication: SMV and splenic vein thrombosis  Allergies  Allergen Reactions  . Cefazolin Rash  . Penicillins     REACTION: unknown allergy - hx since childhood    Patient Measurements: Height: 5' 1.81" (157 cm) Weight: 147 lb 4.3 oz (66.8 kg) IBW/kg (Calculated) : 54.17  Labs:  Recent Labs  07/07/12 0500 07/08/12 0625 07/09/12 0440  HGB 8.3* 8.7* 8.5*  HCT 25.6* 25.7* 26.4*  PLT 357 414* 443*  CREATININE  --  0.54 0.53    Assessment: 62 yo M who had ORIF on 4/18, started prophylaxis Lovenox post-op and found on 4/29 with thrombosis in SMV and splenic vein (likely 2/2 acute pancreatitis). Lovenox change to full dose.  Patient with h/o CVA with left sided hemiparesis.  Patient is currently receiving Lovenox 70 mg sq q12h.  Plan discharge when SNF bed available.  Patient will not be able to tolerate orals for the next few weeks so MD will continue Lovenox outpatient for now.   Renal function stable.  Hgb 9.5, low but stable.  No bleeding.    Goal of Therapy:  Anti-Xa level 0.6-1.2 units/ml 4hrs after LMWH dose given Monitor platelets by anticoagulation protocol: Yes   Plan:   Change Lovenox to 100 mg sq q24h with next dose for outpatient convenience  Pharmacy will f/u  Geoffry Paradise, PharmD, BCPS Pager: [phone] 10:38 AM Pharmacy #: [id number]

## 2012-07-09 NOTE — Progress Notes (Signed)
    [facility]  Date of Admission:  06/23/2012  Antibiotics: Imipenem day 12 Total antibiotics day 17  Subjective: No new complaints  Objective: Temp:  [98.1 F (36.7 C)-98.7 F (37.1 C)] 98.7 F (37.1 C) (05/02 0707) Pulse Rate:  [103-112] 103 (05/02 0707) Resp:  [18-20] 20 (05/02 0707) BP: (87-130)/(62-100) 116/73 mmHg (05/02 0707) SpO2:  [95 %-100 %] 96 % (05/02 0707)  General: Awake, alert, nad Skin: no rashes Lungs: CTA B Ext: no edema  Lab Results Lab Results  Component Value Date   WBC 7.4 07/09/2012   HGB 8.5* 07/09/2012   HCT 26.4* 07/09/2012   MCV 83.3 07/09/2012   PLT 443* 07/09/2012    Lab Results  Component Value Date   CREATININE 0.53 07/09/2012   BUN 7 07/09/2012   NA 133* 07/09/2012   K 3.4* 07/09/2012   CL 100 07/09/2012   CO2 25 07/09/2012    Lab Results  Component Value Date   ALT 16 07/08/2012   AST 19 07/08/2012   ALKPHOS 131* 07/08/2012   BILITOT 0.6 07/08/2012      Microbiology: Recent Results (from the past 240 hour(s))  CLOSTRIDIUM DIFFICILE BY PCR     Status: None   Collection Time    06/29/12  4:45 PM      Result Value Range Status   C difficile by pcr NEGATIVE  NEGATIVE Final    Studies/Results: No results found.  Assessment/Plan: 1) pancreatic necrosis, polymicrobial bacteremia - Continues to be clinically stable.  WBC stable at 7,400 today and on bowel rest with TNA.   -Continue imipenem for 10-14 days at least and reevaluate clinically and with CT prior to considering stopping antibiotics or per surgery recommendations -we will follow up with the patient in 2 weeks after a repeat CT scan to determine any further need for imipenem (we will arrange follow up), please arrange a repeat CT prior to the appt  I will sign off, call with questions  Staci Righter, MD [facility] [phone] pager   07/09/2012, 9:22 AM

## 2012-07-09 NOTE — Progress Notes (Signed)
[facility]  Patient Status: New pt for [facility]. Pt will be discharging to [facility] week of 07/12/12.  [facility] is providing the following services:   [facility] will provide TPN for pt at SNF. [facility] Coordinator team will follow to support the d/c to SNF and support SN team with transition and clinical needs.  If patient discharges after hours, please call [phone].   Steven [name] 07/09/2012, 4:47 PM

## 2012-07-09 NOTE — Progress Notes (Signed)
[facility] PROGRESS NOTE  Steven [name]:[id number] DOB: 04-02-1950 DOA: 06/23/2012 PCP: Lehman Prom, NP  Interval history 62 year old male patient, nursing home resident, PMH of CAD, dementia, CVA admitted on 06/24/12 after an unwitnessed fall. In the ED, x-rays revealed left hip fracture. He does have chronic left humerus fracture. Patient nonambulatory per family. He probably has dementia. Orthopedics was consulted and patient underwent ORIF of left hip fracture on 4/18. Patient had sinus tachycardia early during hospitalization. CTA chest negative for PE. TSH normal. Patient was found to have polymicrobial bacteremia due to enterococcus and Klebsiella. He was treated with broad-spectrum IV antibiotics-aztreonam, vancomycin and Levaquin for presumed healthcare acquired pneumonia. Infectious disease was consulted. CT abdomen showed necrotizing pancreatitis/abscess in the area of head of pancreas-likely source of bacteremia. A Gastrografin study did not show any bowel perforation. His antibiotics were narrowed to imipenem. Despite this, patient continued to have fevers and worsening leukocytosis. Surgeons were consulted. Patient has clinically improved. Surgeons, GI and ID continue to follow patient. Current plan is for n.p.o. except sips of clear liquids, TPN and prolonged antibiotics. 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 He eventually we'll need cholecystectomy. Etiology of pancreatitis-chronic alcoholic pancreatitis versus rule out malignancy or an diagnosed ulcer.  Assessment/Plan:                                                                                                             Necrotizing pancreatitis - fever and leukocytosis resolved - surgery following- no surgical planned at this time. - US abdomen show gallstones which may suggest an etiology. Per surgery, no need for acute cholecystectomy, repeat imaging in 6 weeks ad plan for  cholecystectomy at that time if there is no pseudocyst formation. - Continue PICC, TPN and sips of clear liquids and IV antibiotics. IV imipenem for 2 weeks at least and reevaluate clinically and with CT prior to considering stopping antibiotics (ID recommendation)-surgery indicates CT repeat in 3-4 weeks and may need antibiotics until then.  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 Plan discussed with GI as well.  Started on full dose Lovenox.  Polymicrobial bacteremia/Leukocytosis - C diff negative. patient is afebrile. Chest x-ray and UA are unremarkable except the chest x-ray showing mild congestion. He was found to have polymicrobial bacteremia with Positive Blood cultures showing enterococcus and klebsiella. ID consulted for further recommendations. CT abd and pelvis was done and showed necrotizing pancreatitis. Started him on primaxin. - ID following, appreciate input - continue antibiotics, appreciate ID input regarding total length.  - Has PICC line. Continue Primaxin  Left femoral intertrochanteric fracture -  - Underwent ORIF of the left femur on 4/18. PT/OT evaluation, plan for SNF placement. Pain control. Outpatient followup with orthopedics in 2 weeks.  Sinus tachycardia - May be pain, related due to dehydration or most likely due to #1.  CT angiogram of the chest to rule out PE was negative. Free t4 and  TSH within normal limits. Echocardiogram showed good LVEF without any wall abn. His tachycardia improved and he is on b blockers. Stable, discontinue telemetry. Likely secondary to bacteremia.  History of CVA with left-sided hemiparesis - initially plavix held  For surgery of the left hip. Back on Plavix, may need to be stopped again if planning chole.   Dementia - no acute issues.  Chronic left humerus fracture. no acute issues.   Hypokalemia; replete as needed    Anemia: Likely multifactorial-chronic disease, acute illness and phlebotomies. Follow CBC name. Transfuse if hemoglobin less than 7 g/dL  DVT prophylaxis on lovenox.   Code Status: full code Family Communication: Discussed with patient's sister, [name] 5/2 Disposition Plan: SNF when medically stable  PROCEDURE:   Open reduction and internal fixation of the left comminuted intertrochanteric femur fracture on 4/18  PICC line  Consultants:  Cardiology for tachycardic  Orthopedics   ID Dr Orvan Falconer.  GI Dr. Arlyce Dice  General surgery  HPI/Subjective: "Feel like hell"- usual complaints  Objective: Filed Vitals:   07/08/12 2210 07/09/12 0000 07/09/12 0400 07/09/12 0707  BP: 130/100   116/73  Pulse: 112   103  Temp: 98.4 F (36.9 C)   98.7 F (37.1 C)  TempSrc: Oral   Oral  Resp: 20 20 18 20   Height:      Weight:      SpO2: 95% 95% 96% 96%    Intake/Output Summary (Last 24 hours) at 07/09/12 0727 Last data filed at 07/09/12 0655  Gross per 24 hour  Intake    160 ml  Output   1023 ml  Net   -863 ml   Filed Weights   06/24/12 0837 07/05/12 0705 07/06/12 0500  Weight: 64.411 kg (142 lb) 64.819 kg (142 lb 14.4 oz) 66.8 kg (147 lb 4.3 oz)    Exam: General: Patient lying comfortably in bed without distress.   Cardiovascular: S1-S2, RRR. No JVD, murmurs or pedal edema. Respiratory: Reduced breath sounds in the bases but otherwise clear to auscultation. No increased work of breathing. Abdomen: Nondistended and soft. Mild epigastric tenderness but without rigidity, guarding or rebound. Musculoskeletal: no edema Neurologic: Alert and oriented to self and place. No new focal deficits. Chronic left hemiparesis  Data Reviewed: Basic Metabolic Panel:  Recent Labs Lab 07/03/12 0542 07/04/12 0510 07/06/12 0440 07/08/12 0625 07/09/12 0440  NA 135  --  133* 133* 133*  K 3.3* 3.3* 3.4* 3.1* 3.4*  CL 101  --  99 97 100  CO2 24  --  26 29 25   GLUCOSE 99   --  89 146* 149*  BUN 15  --  7 5* 7  CREATININE 0.53  --  0.57 0.54 0.53  CALCIUM 7.4*  --  7.7* 7.6* 7.8*  MG  --  1.8 1.9 1.8  --   PHOS  --   --   --  3.2  --    Liver Function Tests:  Recent Labs Lab 07/06/12 0440 07/08/12 0625  AST 28 19  ALT 24 16  ALKPHOS 176* 131*  BILITOT 0.7 0.6  PROT 5.2* 5.3*  ALBUMIN 1.9* 1.9*    Recent Labs Lab 07/04/12 0510  LIPASE 38   CBC:  Recent Labs Lab 07/05/12 0418 07/06/12 0440 07/07/12 0500 07/08/12 0625 07/09/12 0440  WBC 9.0 8.8 7.2 7.0 7.4  NEUTROABS  --   --   --  4.7  --   HGB 9.4* 9.0* 8.3* 8.7* 8.5*  HCT 27.2* 26.6*  25.6* 25.7* 26.4*  MCV 82.7 83.1 83.4 82.6 83.3  PLT 384 373 357 414* 443*   BNP (last 3 results)  Recent Labs  06/24/12 0405 06/24/12 0551  PROBNP 1874.0* 1830.0*   Recent Results (from the past 240 hour(s))  CLOSTRIDIUM DIFFICILE BY PCR     Status: None   Collection Time    06/29/12  4:45 PM      Result Value Range Status   C difficile by pcr NEGATIVE  NEGATIVE Final     Studies: No results found.  Scheduled Meds: . acetaminophen  1,000 mg Oral TID  . cholecalciferol  1,000 Units Oral q morning - 10a  . clopidogrel  75 mg Oral Q breakfast  . enoxaparin (LOVENOX) injection  70 mg Subcutaneous Q12H  . imipenem-cilastatin  500 mg Intravenous Q8H  . insulin aspart  0-9 Units Subcutaneous Q8H  . lip balm  1 application Topical BID  . metoprolol tartrate  50 mg Oral BID  . multivitamin with minerals  1 tablet Oral q morning - 10a  . pantoprazole  40 mg Oral Daily  . potassium chloride  20 mEq Oral Daily  . psyllium  1 packet Oral BID  . saccharomyces boulardii  250 mg Oral BID  . tamsulosin  0.4 mg Oral QHS   Continuous Infusions: . Marland KitchenTPN (CLINIMIX-E) Adult 60 mL/hr at 07/08/12 1736  . lactated ringers 20 mL/hr at 07/07/12 2336    Principal Problem:   Closed left hip fracture Active Problems:   CVA   Dementia   Fracture of left humerus   Leucocytosis   Bacteremia due to  Enterococcus   Bacteremia due to Klebsiella pneumoniae   Abnormal finding on GI tract imaging   Pancreatitis, acute   Dilated bile duct   Pancreatic necrosis   Superior mesenteric vein thrombosis   Time spent: 35 min  [facility]  [facility] Pager [phone]. If 7PM-7AM, please contact night-coverage at [email or web address], password [facility] 07/09/2012, 7:27 AM  LOS: 16 days

## 2012-07-09 NOTE — Progress Notes (Addendum)
CSW working with CSW leadership for Diff[facility] to Place bed (as patient has Medicaid as payor & now receiving TPN). CSW has faxed information out to [place] counties. Awaiting call back from [facility] in [place]. CSW Interior and spatial designer, [facility], Dr. Jacky Kindle aware. CSW will follow-up Monday.   Clinical Social Work Department CLINICAL SOCIAL WORK PLACEMENT NOTE 07/09/2012  Patient:  Steven [name], Steven [name]  Account Number:  [id number] Admit date:  06/23/2012  Clinical Social Worker:  Doree Albee  Date/time:  06/26/2012 03:15 PM  Clinical Social Work is seeking post-discharge placement for this patient at the following level of care:   SKILLED NURSING   (*CSW will update this form in Epic as items are completed)   06/26/2012  Patient/family provided with [facility] Department of Clinical Social Work's list of facilities offering this level of care within the geographic area requested by the patient (or if unable, by the patient's family).  06/26/2012  Patient/family informed of their freedom to choose among providers that offer the needed level of care, that participate in Medicare, Medicaid or managed care program needed by the patient, have an available bed and are willing to accept the patient.  06/26/2012  Patient/family informed of [facility]' ownership interest in [facility], as well as of the fact that they are under no obligation to receive care at this facility.  PASARR submitted to EDS on 06/26/2012 PASARR number received from EDS on 06/26/2012  FL2 transmitted to all facilities in geographic area requested by pt/family on  06/26/2012 FL2 transmitted to all facilities within larger geographic area on 07/07/2012  Patient informed that his/her managed care company has contracts with or will negotiate with  certain facilities, including the following:     Patient/family informed of bed offers received:    Patient chooses bed at  Physician recommends and patient chooses bed at    Patient to be transferred to  on   Patient to be transferred to facility by   The following physician request were entered in Epic:   Additional Comments:   Unice Bailey, LCSW [facility] Clinical Social Worker cell #: [phone]

## 2012-07-09 NOTE — Progress Notes (Signed)
14 Days Post-Op  Subjective: Pt a bit disoriented.  Denies flatus or BM but has a BM documented.  Pt states he's in pain, but no abdominal tenderness.  Pt ready to get out of the hospital when SNF bed available.    Objective: Vital signs in last 24 hours: Temp:  [98.1 F (36.7 C)-98.7 F (37.1 C)] 98.7 F (37.1 C) (05/02 0707) Pulse Rate:  [103-112] 103 (05/02 0707) Resp:  [18-20] 20 (05/02 0707) BP: (87-130)/(62-100) 116/73 mmHg (05/02 0707) SpO2:  [95 %-100 %] 96 % (05/02 0707) Last BM Date: 07/08/12  Intake/Output from previous day: 05/01 0701 - 05/02 0700 In: 1075 [P.O.:160; IV Piggyback:200; TPN:715] Out: 1023 [Urine:1023] Intake/Output this shift:    PE: Gen:  Alert, NAD, pleasant Abd: Soft, NT/ND, +BS, no HSM  Lab Results:   Recent Labs  07/08/12 0625 07/09/12 0440  WBC 7.0 7.4  HGB 8.7* 8.5*  HCT 25.7* 26.4*  PLT 414* 443*   BMET  Recent Labs  07/08/12 0625 07/09/12 0440  NA 133* 133*  K 3.1* 3.4*  CL 97 100  CO2 29 25  GLUCOSE 146* 149*  BUN 5* 7  CREATININE 0.54 0.53  CALCIUM 7.6* 7.8*   PT/INR No results found for this basename: LABPROT, INR,  in the last 72 hours CMP     Component Value Date/Time   NA 133* 07/09/2012 0440   K 3.4* 07/09/2012 0440   CL 100 07/09/2012 0440   CO2 25 07/09/2012 0440   GLUCOSE 149* 07/09/2012 0440   BUN 7 07/09/2012 0440   CREATININE 0.53 07/09/2012 0440   CALCIUM 7.8* 07/09/2012 0440   PROT 5.3* 07/08/2012 0625   ALBUMIN 1.9* 07/08/2012 0625   AST 19 07/08/2012 0625   ALT 16 07/08/2012 0625   ALKPHOS 131* 07/08/2012 0625   BILITOT 0.6 07/08/2012 0625   GFRNONAA >90 07/09/2012 0440   GFRAA >90 07/09/2012 0440   Lipase     Component Value Date/Time   LIPASE 38 07/04/2012 0510       Studies/Results: No results found.  Anti-infectives: Anti-infectives   Start     Dose/Rate Route Frequency Ordered Stop   07/05/12 2200  imipenem-cilastatin (PRIMAXIN) 500 mg in sodium chloride 0.9 % 100 mL IVPB     500 mg 200 mL/hr over 30  Minutes Intravenous 3 times per day 07/05/12 2114     06/28/12 1630  imipenem-cilastatin (PRIMAXIN) 500 mg in sodium chloride 0.9 % 100 mL IVPB  Status:  Discontinued     500 mg 200 mL/hr over 30 Minutes Intravenous Every 8 hours 06/28/12 1617 07/05/12 2114   06/28/12 0800  imipenem-cilastatin (PRIMAXIN) 500 mg in sodium chloride 0.9 % 100 mL IVPB  Status:  Discontinued     500 mg 200 mL/hr over 30 Minutes Intravenous Every 8 hours 06/28/12 0718 06/28/12 1030   06/27/12 1400  metroNIDAZOLE (FLAGYL) tablet 500 mg  Status:  Discontinued     500 mg Oral 3 times per day 06/27/12 1032 06/27/12 1229   06/26/12 2000  vancomycin (VANCOCIN) IVPB 1000 mg/200 mL premix  Status:  Discontinued     1,000 mg 200 mL/hr over 60 Minutes Intravenous Every 8 hours 06/26/12 1200 06/27/12 1229   06/26/12 1215  vancomycin (VANCOCIN) IVPB 1000 mg/200 mL premix     1,000 mg 200 mL/hr over 60 Minutes Intravenous  Once 06/26/12 1203 06/26/12 1315   06/25/12 0800  levofloxacin (LEVAQUIN) IVPB 750 mg  Status:  Discontinued     750  mg 100 mL/hr over 90 Minutes Intravenous Every 24 hours 06/24/12 1014 06/27/12 1039   06/25/12 0700  clindamycin (CLEOCIN) IVPB 900 mg  Status:  Discontinued     900 mg 100 mL/hr over 30 Minutes Intravenous  Once 06/25/12 0912 06/27/12 1205   06/24/12 2200  vancomycin (VANCOCIN) IVPB 750 mg/150 ml premix  Status:  Discontinued     750 mg 150 mL/hr over 60 Minutes Intravenous Every 12 hours 06/24/12 1014 06/26/12 1200   06/24/12 1400  aztreonam (AZACTAM) 1 g in dextrose 5 % 50 mL IVPB  Status:  Discontinued     1 g 100 mL/hr over 30 Minutes Intravenous 3 times per day 06/24/12 1014 06/27/12 1229   06/24/12 0415  levofloxacin (LEVAQUIN) IVPB 750 mg     750 mg 100 mL/hr over 90 Minutes Intravenous  Once 06/24/12 0400 06/24/12 0557   06/24/12 0415  vancomycin (VANCOCIN) IVPB 1000 mg/200 mL premix     1,000 mg 200 mL/hr over 60 Minutes Intravenous  Once 06/24/12 0400 06/24/12 0654    06/24/12 0415  aztreonam (AZACTAM) 2 g in dextrose 5 % 50 mL IVPB     2 g 100 mL/hr over 30 Minutes Intravenous  Once 06/24/12 0409 06/24/12 4540       Assessment/Plan Acute necrotizing pancreatitis- US shows gallstones and mild biliary dilatation so gallstone etiology likely.  Repeat CT still show acute pancreatitis, pancreatic necroisis at The head and tail of the pancreas.necrosis or abscess.  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 - on Lovenox  2. Positive bacteremia of uncertain etiology. Been followed by ID.  3. 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  4. Significant dementia; currently resides at a skilled nursing facility.  5. Prior history of alcohol and substance abuse.  6. Fall with left hip ORIF 4/17 Dr. Charlann Boxer  7. Urinary incontinence/hypertonicity of the bladder.  8. Prior fracture of the left humerus, with history of multiple trauma/crush fracture.  9. Lumbar spondylitis  10.  Protein calorie malnutrition on TPN  Plan: Currently on TNA for 3-4 weeks upon discharge before advancing PO, Afebrile, with normal WBC. Continue TNA and rest his pancrease. I will check with DR. Maisie Fus about when to plan to repeat CT usually repeated at 6 weeks.  Medicine sending to SNF when bed available, but likely not today, maybe Monday.     LOS: 16 days    DORT, Aundra Millet 07/09/2012, 9:57 AM Pager: [phone]

## 2012-07-10 LAB — CBC
Platelets: 504 10*3/uL — ABNORMAL HIGH (ref 150–400)
RBC: 3.3 MIL/uL — ABNORMAL LOW (ref 4.22–5.81)
WBC: 9.9 10*3/uL (ref 4.0–10.5)

## 2012-07-10 LAB — GLUCOSE, CAPILLARY
Glucose-Capillary: 162 mg/dL — ABNORMAL HIGH (ref 70–99)
Glucose-Capillary: 144 mg/dL — ABNORMAL HIGH (ref 70–99)

## 2012-07-10 MED ORDER — CLINIMIX E/DEXTROSE (5/20) 5 % IV SOLN
INTRAVENOUS | Status: AC
  Administered 2012-07-10: 18:00:00 via INTRAVENOUS
  Filled 2012-07-10: qty 2000

## 2012-07-10 NOTE — Progress Notes (Signed)
PARENTERAL NUTRITION CONSULT NOTE - Follow Up  Pharmacy Consult for TNA Indication: severe pancreatitis  Allergies  Allergen Reactions  . Cefazolin Rash  . Penicillins     REACTION: unknown allergy - hx since childhood    Patient Measurements: Height: 5' 1.81" (157 cm) Weight: 147 lb 4.3 oz (66.8 kg) IBW/kg (Calculated) : 54.17 Adjusted Body Weight: 58 kg  Vital Signs: Temp: 97.1 F (36.2 C) (05/03 0531) Temp src: Oral (05/03 0531) BP: 137/87 mmHg (05/03 0531) Pulse Rate: 108 (05/03 0531) Intake/Output from previous day: 05/02 0701 - 05/03 0700 In: 863 [P.O.:120; IV Piggyback:300; TPN:443] Out: 2200 [Urine:2200] Intake/Output from this shift:    Labs:  Recent Labs  07/08/12 0625 07/09/12 0440 07/10/12 0535  WBC 7.0 7.4 9.9  HGB 8.7* 8.5* 9.0*  HCT 25.7* 26.4* 27.7*  PLT 414* 443* 504*     Recent Labs  07/08/12 0625 07/09/12 0440  NA 133* 133*  K 3.1* 3.4*  CL 97 100  CO2 29 25  GLUCOSE 146* 149*  BUN 5* 7  CREATININE 0.54 0.53  CALCIUM 7.6* 7.8*  MG 1.8  --   PHOS 3.2  --   PROT 5.3*  --   ALBUMIN 1.9*  --   AST 19  --   ALT 16  --   ALKPHOS 131*  --   BILITOT 0.6  --   PREALBUMIN 6.8*  --   TRIG 171*  --   CHOL 89  --    Estimated Creatinine Clearance: 81.2 ml/min (by C-G formula based on Cr of 0.53).    Recent Labs  07/09/12 0844 07/09/12 1648 07/10/12 0123  GLUCAP 160* 131* 153*    Assessment:  61 yom with cecrotizing pancreatitis, [place] abd with gallstones. Surgery on board, no need for acute cholecystectomy. Plan repeat imaging in 6 weeks and plan for cholecystectomy at that time if there is no pseudocyst formation. Surgery would like to start TNA since patient will not be able to tolerate anything more than clears for weeks. PICC placed 4/29, TNA started 4/30.  Per Surgery notes, plan to cycle TNA once at goal to assist with placement.  5/3: D#4 TNA - MD plans TNA for 3-4 weeks upon discharge before starting PO.  Awaiting bed for  SNF.  Nutritional Goals:  - RD recs 4/30: 2020 - 2340 Kcal, 107-120 grams protein, 2.2 L fluid - Clinimix E 5/20 @ 90 ml/hr + IVF 20% at 10 ml/hr on MWF will provide: 108 g protein, average of 2107 Kcal per day.  Current nutrition:  - Diet: Clear liquid starting 4/26 - TNA: Clinimix E 5/20 @ 83 ml/hr - mIVF: LR @ KVO  CBGs & Insulin requirements past 24 hours:  - CBGs 131-160, 5 unit of Novolog SSI  Labs: no labs checked 5/3  Electrolytes: K+ 3.4, Mag/Phos wnl on 5/1 Renal Function: Scr wnl/stable Hepatic Function: Alk phos elevated 4/29 @ 176 (prior to TNA initiation) - improving to 131 Pre-Albumin: 6.8 on 5/1 Triglycerides: slightly elevated at 171 on 5/1 CBGs: controlled on sensitive SSI, no h/o DM   Plan:  At 1800 tonight  Continue TNA Clinimix E 5/20 @ goal rate 83 ml/hr  TNA to contain IV fat emulsion 20% at 10 ml/hr on MWF only due to ongoing shortage  Patient has order for oral multivitamin tablet but last dose 4/30, refused dose on 5/1 so pharmacy will add MV and trace element to TNA MWF.    Continue sensitive SSI q8h  TNA labs  Monday/Thursdays  Pharmacy will follow up daily.  If patient still here, consult with surgery if would like to cycle TNA - if not, could be done outpatient.   Hessie Knows, PharmD, BCPS Pager [phone] 07/10/2012 7:35 AM

## 2012-07-10 NOTE — Progress Notes (Signed)
[facility] PROGRESS NOTE  Steven [name] WJX:[id number] DOB: 03-21-1950 DOA: 06/23/2012 PCP: Lehman Prom, NP  Interval history 62 year old male patient, nursing home resident, PMH of CAD, dementia, CVA admitted on 06/24/12 after an unwitnessed fall. In the ED, x-rays revealed left hip fracture. He does have chronic left humerus fracture. Patient nonambulatory per family. He probably has dementia. Orthopedics was consulted and patient underwent ORIF of left hip fracture on 4/18. Patient had sinus tachycardia early during hospitalization. CTA chest negative for PE. TSH normal. Patient was found to have polymicrobial bacteremia due to enterococcus and Klebsiella. He was treated with broad-spectrum IV antibiotics-aztreonam, vancomycin and Levaquin for presumed healthcare acquired pneumonia. Infectious disease was consulted. CT abdomen showed necrotizing pancreatitis/abscess in the area of head of pancreas-likely source of bacteremia. A Gastrografin study did not show any bowel perforation. His antibiotics were narrowed to imipenem. Despite this, patient continued to have fevers and worsening leukocytosis. Surgeons were consulted. Patient has clinically improved. Surgeons, GI and ID continue to follow patient. Current plan is for n.p.o. except sips of clear liquids, TPN and prolonged antibiotics and we image with CT abdomen in 3-4 weeks and further plans at that time. He was also found to have splenic vein/SMA thrombosis-anticoagulation and is on full dose Lovenox. He eventually we'll need cholecystectomy. Etiology of pancreatitis-chronic alcoholic pancreatitis versus rule out malignancy or an diagnosed ulcer. Currently patient is medically stable and awaiting SNF  Assessment/Plan:                                                                                                             Necrotizing pancreatitis - fever and leukocytosis resolved - surgery following- no surgical planned at this  time. - US abdomen show gallstones which may suggest an etiology. Per surgery, no need for acute cholecystectomy, repeat imaging in 6 weeks ad plan for cholecystectomy at that time if there is no pseudocyst formation. - Continue PICC, TPN and sips of clear liquids and IV antibiotics. IV imipenem for 2 weeks at least and reevaluate clinically and with CT prior to considering stopping antibiotics (ID recommendation)-surgery indicates CT repeat in 3-4 weeks and may need antibiotics until then.  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 Plan discussed with GI as well.  Started on full dose Lovenox.  Polymicrobial bacteremia/Leukocytosis - C diff negative. patient is afebrile. Chest x-ray and UA are unremarkable except the chest x-ray showing mild congestion. He was found to have polymicrobial bacteremia with Positive Blood cultures showing enterococcus and klebsiella. ID consulted for further recommendations. CT abd and pelvis was done and showed necrotizing pancreatitis. Started him on primaxin. - ID following, appreciate input - continue antibiotics, appreciate ID input regarding total length.  - Has PICC line. Continue Primaxin  Left femoral intertrochanteric fracture -  - Underwent ORIF of the left femur on 4/18. PT/OT evaluation, plan for SNF placement. Pain control. Outpatient followup with orthopedics in 2 weeks.  Sinus tachycardia -  May be pain, related due to dehydration or most likely due to #1.  CT angiogram of the chest to rule out PE was negative. Free t4 and TSH within normal limits. Echocardiogram showed good LVEF without any wall abn. His tachycardia improved and he is on b blockers. Stable, discontinue telemetry. Likely secondary to bacteremia.  History of CVA with left-sided hemiparesis - initially plavix held  For surgery of the left hip. Back on Plavix, may need to be  stopped again if planning chole.   Dementia - no acute issues.  Chronic left humerus fracture. no acute issues.   Hypokalemia; replete as needed   Anemia: Likely multifactorial-chronic disease, acute illness and phlebotomies. Follow CBC name. Transfuse if hemoglobin less than 7 g/dL  DVT prophylaxis on lovenox.   Code Status: Full code Family Communication: Discussed with patient's sister, [name] 5/2 Disposition Plan: SNF when medically stable  PROCEDURE:   Open reduction and internal fixation of the left comminuted intertrochanteric femur fracture on 4/18  PICC line  Consultants:  Cardiology for tachycardic  Orthopedics   ID Dr Orvan Falconer.  GI Dr. Arlyce Dice  General surgery  HPI/Subjective: Feeling cold but denies any complaints today.  Objective: Filed Vitals:   07/10/12 0000 07/10/12 0126 07/10/12 0400 07/10/12 0531  BP:  122/54  137/87  Pulse:  97  108  Temp:  97.3 F (36.3 C)  97.1 F (36.2 C)  TempSrc:  Axillary  Oral  Resp: 18 18 16 16   Height:      Weight:      SpO2: 91% 91%  96%    Intake/Output Summary (Last 24 hours) at 07/10/12 1112 Last data filed at 07/10/12 0531  Gross per 24 hour  Intake    863 ml  Output   2200 ml  Net  -1337 ml   Filed Weights   06/24/12 0837 07/05/12 0705 07/06/12 0500  Weight: 64.411 kg (142 lb) 64.819 kg (142 lb 14.4 oz) 66.8 kg (147 lb 4.3 oz)    Exam: General: Patient lying comfortably in bed without distress.   Cardiovascular: S1-S2, RRR. No JVD, murmurs or pedal edema. Respiratory: Reduced breath sounds in the bases but otherwise clear to auscultation. No increased work of breathing. Abdomen: Nondistended and soft. Nontender. Normal bowel sounds heard. Musculoskeletal: no edema Neurologic: Alert and oriented to self and place. No new focal deficits. Chronic left hemiparesis  Data Reviewed: Basic Metabolic Panel:  Recent Labs Lab 07/04/12 0510 07/06/12 0440 07/08/12 0625 07/09/12 0440  NA   --  133* 133* 133*  K 3.3* 3.4* 3.1* 3.4*  CL  --  99 97 100  CO2  --  26 29 25   GLUCOSE  --  89 146* 149*  BUN  --  7 5* 7  CREATININE  --  0.57 0.54 0.53  CALCIUM  --  7.7* 7.6* 7.8*  MG 1.8 1.9 1.8  --   PHOS  --   --  3.2  --    Liver Function Tests:  Recent Labs Lab 07/06/12 0440 07/08/12 0625  AST 28 19  ALT 24 16  ALKPHOS 176* 131*  BILITOT 0.7 0.6  PROT 5.2* 5.3*  ALBUMIN 1.9* 1.9*    Recent Labs Lab 07/04/12 0510  LIPASE 38   CBC:  Recent Labs Lab 07/06/12 0440 07/07/12 0500 07/08/12 0625 07/09/12 0440 07/10/12 0535  WBC 8.8 7.2 7.0 7.4 9.9  NEUTROABS  --   --  4.7  --   --   HGB  9.0* 8.3* 8.7* 8.5* 9.0*  HCT 26.6* 25.6* 25.7* 26.4* 27.7*  MCV 83.1 83.4 82.6 83.3 83.9  PLT 373 357 414* 443* 504*   BNP (last 3 results)  Recent Labs  06/24/12 0405 06/24/12 0551  PROBNP 1874.0* 1830.0*   No results found for this or any previous visit (from the past 240 hour(s)).   Studies: No results found.  Scheduled Meds: . acetaminophen  1,000 mg Oral TID  . cholecalciferol  1,000 Units Oral q morning - 10a  . clopidogrel  75 mg Oral Q breakfast  . enoxaparin (LOVENOX) injection  100 mg Subcutaneous Q24H  . imipenem-cilastatin  500 mg Intravenous Q8H  . insulin aspart  0-9 Units Subcutaneous Q8H  . lip balm  1 application Topical BID  . metoprolol tartrate  50 mg Oral BID  . pantoprazole  40 mg Oral Daily  . potassium chloride  20 mEq Oral Daily  . psyllium  1 packet Oral BID  . saccharomyces boulardii  250 mg Oral BID  . tamsulosin  0.4 mg Oral QHS   Continuous Infusions: . Marland KitchenTPN (CLINIMIX-E) Adult 83 mL/hr at 07/09/12 1733   And  . fat emulsion 240 mL (07/09/12 1733)  . lactated ringers 20 mL/hr at 07/07/12 2336  . Marland KitchenTPN (CLINIMIX-E) Adult      Principal Problem:   Closed left hip fracture Active Problems:   CVA   Dementia   Fracture of left humerus   Leucocytosis   Bacteremia due to Enterococcus   Bacteremia due to Klebsiella  pneumoniae   Abnormal finding on GI tract imaging   Pancreatitis, acute   Dilated bile duct   Pancreatic necrosis   Superior mesenteric vein thrombosis   Time spent: 25 min  [facility]  [facility] Pager [phone]. If 7PM-7AM, please contact night-coverage at [email or web address], password [email or web address] 07/10/2012, 11:12 AM  LOS: 17 days

## 2012-07-10 NOTE — Progress Notes (Signed)
Pt refused all bedtime (22:00) medications, except Primaxin. Tried crushing meds in applesauce, pt still refused meds. Attempted to give meds 30 minutes later, pt still refused. Notified Hospitalist Claiborne Billings), called back and he is aware with no new orders at this time. Will continue to monitor pt. - Christell Faith, RN

## 2012-07-11 DIAGNOSIS — K838 Other specified diseases of biliary tract: Secondary | ICD-10-CM

## 2012-07-11 DIAGNOSIS — K859 Acute pancreatitis without necrosis or infection, unspecified: Secondary | ICD-10-CM

## 2012-07-11 DIAGNOSIS — K8689 Other specified diseases of pancreas: Secondary | ICD-10-CM

## 2012-07-11 DIAGNOSIS — R933 Abnormal findings on diagnostic imaging of other parts of digestive tract: Secondary | ICD-10-CM

## 2012-07-11 LAB — BASIC METABOLIC PANEL
BUN: 11 mg/dL (ref 6–23)
CO2: 24 mEq/L (ref 19–32)
Calcium: 8.3 mg/dL — ABNORMAL LOW (ref 8.4–10.5)
Chloride: 97 mEq/L (ref 96–112)
Creatinine, Ser: 0.51 mg/dL (ref 0.50–1.35)
GFR calc Af Amer: >90 mL/min (ref >90)

## 2012-07-11 LAB — GLUCOSE, CAPILLARY: Glucose-Capillary: 183 mg/dL — ABNORMAL HIGH (ref 70–99)

## 2012-07-11 MED ORDER — INSULIN REGULAR HUMAN 100 UNIT/ML IJ SOLN
INTRAVENOUS | Status: AC
  Administered 2012-07-11: 18:00:00 via INTRAVENOUS
  Filled 2012-07-11: qty 2000

## 2012-07-11 MED ORDER — INSULIN ASPART 100 UNIT/ML ~~LOC~~ SOLN
0.0000 [IU] | Freq: Four times a day (QID) | SUBCUTANEOUS | Status: DC
  Administered 2012-07-11 – 2012-07-13 (×9): 2 [IU] via SUBCUTANEOUS
  Administered 2012-07-13: 1 [IU] via SUBCUTANEOUS
  Administered 2012-07-14 (×2): 2 [IU] via SUBCUTANEOUS
  Administered 2012-07-14 – 2012-07-15 (×3): 1 [IU] via SUBCUTANEOUS
  Administered 2012-07-15: 2 [IU] via SUBCUTANEOUS
  Administered 2012-07-15: 1 [IU] via SUBCUTANEOUS

## 2012-07-11 NOTE — Progress Notes (Signed)
Patient was willing to take his pills. RN crushed pills and put them in applesauce. Patient only consumed one spoonful. RN unable to determine how much of medicine patient actually swallowed. Patient refused lovenox injection. NP, Claiborne Billings, was notified and no new orders were placed. Patient would not even allow RN to assess his left leg. Towards the end of the assessment patient was demanding RN to not touch him and leave him alone. He will be saying curse words then next minute he is calm lying in bed.

## 2012-07-11 NOTE — Progress Notes (Signed)
PARENTERAL NUTRITION CONSULT NOTE - Follow Up  Pharmacy Consult for TNA Indication: severe nectrotizing pancreatitis  Allergies  Allergen Reactions  . Cefazolin Rash  . Penicillins     REACTION: unknown allergy - hx since childhood    Patient Measurements: Height: 5' 1.81" (157 cm) Weight: 147 lb 4.3 oz (66.8 kg) IBW/kg (Calculated) : 54.17 Adjusted Body Weight: 58 kg  Vital Signs: Temp: 98.6 F (37 C) (05/04 0521) Temp src: Oral (05/04 0521) BP: 116/69 mmHg (05/04 0521) Pulse Rate: 116 (05/04 0521) Intake/Output from previous day: 05/03 0701 - 05/04 0700 In: 2600 [P.O.:240; I.V.:200; IV Piggyback:500; TPN:1660] Out: 2000 [Urine:2000] Intake/Output from this shift:    Labs:  Recent Labs  07/09/12 0440 07/10/12 0535  WBC 7.4 9.9  HGB 8.5* 9.0*  HCT 26.4* 27.7*  PLT 443* 504*     Recent Labs  07/09/12 0440 07/11/12 0320  NA 133* 130*  K 3.4* 4.1  CL 100 97  CO2 25 24  GLUCOSE 149* 149*  BUN 7 11  CREATININE 0.53 0.51  CALCIUM 7.8* 8.3*   Estimated Creatinine Clearance: 81.2 ml/min (by C-G formula based on Cr of 0.51).    Recent Labs  07/10/12 1635 07/11/12 0026 07/11/12 0741  GLUCAP 144* 166* 181*    Assessment:  61 yom with cecrotizing pancreatitis, [place] abd with gallstones. Surgery on board, no need for acute cholecystectomy. Plan repeat imaging in 6 weeks and plan for cholecystectomy at that time if there is no pseudocyst formation. Surgery would like to start TNA since patient will not be able to tolerate anything more than clears for weeks. PICC placed 4/29, TNA started 4/30.  Per Surgery notes, plan to cycle TNA once at goal to assist with placement.  5/3: D#4 TNA - MD plans TNA for 3-4 weeks upon discharge before starting PO.  Awaiting bed for SNF.  Nutritional Goals:  - RD recs 4/30: 2020 - 2340 Kcal, 107-120 grams protein, 2.2 L fluid - Clinimix E 5/20 @ 90 ml/hr + IVF 20% at 10 ml/hr on MWF will provide: 108 g protein, average of 2107  Kcal per day.  Current nutrition:  - Diet: Clear liquid starting 4/26 - TNA: Clinimix E 5/20 @ 83 ml/hr - mIVF: LR @ KVO  CBGs & Insulin requirements past 24 hours:  - CBGs 131-160, 5 unit of Novolog SSI  Labs:  Electrolytes: stable with Na low at 130 - Md will need to adjust outside of TNA Renal Function: Scr wnl/stable, UOP 1.2 ml/kg/hr last 24hr Hepatic Function: Alk phos elevated 4/29 @ 176 (prior to TNA initiation) - improving to 131 Pre-Albumin: 6.8 on 5/1 Triglycerides: slightly elevated at 171 on 5/1 CBGs: rising 144-181 last 24hrs - will add insulin to TNA bag   Plan:  At 1800 tonight  Continue TNA Clinimix E 5/20 @ rate 83 ml/hr - will check prealbumin tomorrow and consider further increase in TNA rate to 90 ml/hr if drops  TNA to contain IV fat emulsion 20% at 10 ml/hr on MWF only due to ongoing shortage  Patient has order for oral multivitamin tablet but last dose 4/30, refused dose on 5/1 so pharmacy will add MV and trace element to TNA MWF.    Change SSI to q6 and add insulin to TNA  TNA labs Monday/Thursdays  Pharmacy will follow up daily.  If patient still here, consult with surgery if would like to cycle TNA - if not, could be done outpatient.   Hessie Knows, PharmD, BCPS  Pager [phone] 07/11/2012 8:49 AM

## 2012-07-11 NOTE — Progress Notes (Signed)
[facility] PROGRESS NOTE  Steven [name] ZOX:[id number] DOB: Jun 09, 1950 DOA: 06/23/2012 PCP: Lehman Prom, NP  Interval history 62 year old male patient, nursing home resident, PMH of CAD, dementia, CVA admitted on 06/24/12 after an unwitnessed fall. In the ED, x-rays revealed left hip fracture. He does have chronic left humerus fracture. Patient nonambulatory per family. He probably has dementia. Orthopedics was consulted and patient underwent ORIF of left hip fracture on 4/18. Patient had sinus tachycardia early during hospitalization. CTA chest negative for PE. TSH normal. Patient was found to have polymicrobial bacteremia due to enterococcus and Klebsiella. He was treated with broad-spectrum IV antibiotics-aztreonam, vancomycin and Levaquin for presumed healthcare acquired pneumonia. Infectious disease was consulted. CT abdomen showed necrotizing pancreatitis/abscess in the area of head of pancreas-likely source of bacteremia. A Gastrografin study did not show any bowel perforation. His antibiotics were narrowed to imipenem. Despite this, patient continued to have fevers and worsening leukocytosis. Surgeons were consulted. Patient has clinically improved. Surgeons, GI and ID continue to follow patient. Current plan is for n.p.o. except sips of clear liquids, TPN and prolonged antibiotics and we image with CT abdomen in 3-4 weeks and further plans at that time. He was also found to have splenic vein/SMA thrombosis-anticoagulation and is on full dose Lovenox. He eventually we'll need cholecystectomy. Etiology of pancreatitis-chronic alcoholic pancreatitis versus rule out malignancy or an diagnosed ulcer. Currently patient is medically stable and awaiting SNF  Assessment/Plan:                                                                                                             Necrotizing pancreatitis - fever and leukocytosis resolved - surgery following- no surgical planned at this  time. - US abdomen show gallstones which may suggest an etiology. Per surgery, no need for acute cholecystectomy, repeat imaging in 6 weeks ad plan for cholecystectomy at that time if there is no pseudocyst formation. - Continue PICC, TPN and sips of clear liquids and IV antibiotics. IV imipenem for 2 weeks at least and reevaluate clinically and with CT prior to considering stopping antibiotics (ID recommendation)-surgery indicates CT repeat in 3-4 weeks and may need antibiotics until then.  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 Plan discussed with GI as well.  Started on full dose Lovenox.  Polymicrobial bacteremia/Leukocytosis - C diff negative. patient is afebrile. Chest x-ray and UA are unremarkable except the chest x-ray showing mild congestion. He was found to have polymicrobial bacteremia with Positive Blood cultures showing enterococcus and klebsiella. ID consulted for further recommendations. CT abd and pelvis was done and showed necrotizing pancreatitis. Started him on primaxin. - ID following, appreciate input - continue antibiotics, appreciate ID input regarding total length.  - Has PICC line. Continue Primaxin  Left femoral intertrochanteric fracture -  - Underwent ORIF of the left femur on 4/18. PT/OT evaluation, plan for SNF placement. Pain control. Outpatient followup with orthopedics in 2 weeks.  Sinus tachycardia -  May be pain, related due to dehydration or most likely due to #1.  CT angiogram of the chest to rule out PE was negative. Free t4 and TSH within normal limits. Echocardiogram showed good LVEF without any wall abn. His tachycardia improved and he is on b blockers. Stable, discontinue telemetry. Likely secondary to bacteremia.  History of CVA with left-sided hemiparesis - initially plavix held  For surgery of the left hip. Back on Plavix, may need to be  stopped again if planning chole.   Dementia - no acute issues.  Chronic left humerus fracture. no acute issues.   Hypokalemia; replete as needed   Anemia: Likely multifactorial-chronic disease, acute illness and phlebotomies. Follow CBC name. Transfuse if hemoglobin less than 7 g/dL  Hyponatremia: Clinically euvolemic.? SIADH. Monitor.  DVT prophylaxis on lovenox.   Code Status: Full code Family Communication: Discussed with patient's sister, Ms. Steven [name] 5/2 Disposition Plan: SNF when medically stable  PROCEDURE:   Open reduction and internal fixation of the left comminuted intertrochanteric femur fracture on 4/18  PICC line  Consultants:  Cardiology for tachycardic  Orthopedics   ID Dr Orvan Falconer.  GI Dr. Arlyce Dice  General surgery  HPI/Subjective: Patient denies complaints. Denies feeling cold. Denies pain.  Objective: Filed Vitals:   07/10/12 2039 07/11/12 0000 07/11/12 0348 07/11/12 0521  BP: 116/69   116/69  Pulse: 117   116  Temp: 97.8 F (36.6 C)   98.6 F (37 C)  TempSrc: Axillary   Oral  Resp: 18 16 16 20   Height:      Weight:      SpO2: 99%   99%    Intake/Output Summary (Last 24 hours) at 07/11/12 0736 Last data filed at 07/11/12 0700  Gross per 24 hour  Intake   2600 ml  Output   2000 ml  Net    600 ml   Filed Weights   06/24/12 0837 07/05/12 0705 07/06/12 0500  Weight: 64.411 kg (142 lb) 64.819 kg (142 lb 14.4 oz) 66.8 kg (147 lb 4.3 oz)    Exam: General: Patient comfortably sitting on a reclining chair. Cardiovascular: S1-S2, RRR. No JVD, murmurs or pedal edema. Respiratory: Reduced breath sounds in the bases but otherwise clear to auscultation/poor inspiratory effort. No increased work of breathing. Abdomen: Nondistended and soft. Nontender. Normal bowel sounds heard. Musculoskeletal: no edema Neurologic: Alert and oriented to self and place. No new focal deficits. Chronic left hemiparesis  Data Reviewed: Basic Metabolic  Panel:  Recent Labs Lab 07/06/12 0440 07/08/12 0625 07/09/12 0440 07/11/12 0320  NA 133* 133* 133* 130*  K 3.4* 3.1* 3.4* 4.1  CL 99 97 100 97  CO2 26 29 25 24   GLUCOSE 89 146* 149* 149*  BUN 7 5* 7 11  CREATININE 0.57 0.54 0.53 0.51  CALCIUM 7.7* 7.6* 7.8* 8.3*  MG 1.9 1.8  --   --   PHOS  --  3.2  --   --    Liver Function Tests:  Recent Labs Lab 07/06/12 0440 07/08/12 0625  AST 28 19  ALT 24 16  ALKPHOS 176* 131*  BILITOT 0.7 0.6  PROT 5.2* 5.3*  ALBUMIN 1.9* 1.9*   No results found for this basename: LIPASE, AMYLASE,  in the last 168 hours CBC:  Recent Labs Lab 07/06/12 0440 07/07/12 0500 07/08/12 0625 07/09/12 0440 07/10/12 0535  WBC 8.8 7.2 7.0 7.4 9.9  NEUTROABS  --   --  4.7  --   --   HGB 9.0*  8.3* 8.7* 8.5* 9.0*  HCT 26.6* 25.6* 25.7* 26.4* 27.7*  MCV 83.1 83.4 82.6 83.3 83.9  PLT 373 357 414* 443* 504*   BNP (last 3 results)  Recent Labs  06/24/12 0405 06/24/12 0551  PROBNP 1874.0* 1830.0*   No results found for this or any previous visit (from the past 240 hour(s)).   Studies: No results found.  Scheduled Meds: . acetaminophen  1,000 mg Oral TID  . cholecalciferol  1,000 Units Oral q morning - 10a  . clopidogrel  75 mg Oral Q breakfast  . enoxaparin (LOVENOX) injection  100 mg Subcutaneous Q24H  . imipenem-cilastatin  500 mg Intravenous Q8H  . insulin aspart  0-9 Units Subcutaneous Q8H  . lip balm  1 application Topical BID  . metoprolol tartrate  50 mg Oral BID  . pantoprazole  40 mg Oral Daily  . potassium chloride  20 mEq Oral Daily  . psyllium  1 packet Oral BID  . saccharomyces boulardii  250 mg Oral BID  . tamsulosin  0.4 mg Oral QHS   Continuous Infusions: . lactated ringers 20 mL/hr at 07/07/12 2336  . Marland KitchenTPN (CLINIMIX-E) Adult 83 mL/hr at 07/10/12 1750    Principal Problem:   Closed left hip fracture Active Problems:   CVA   Dementia   Fracture of left humerus   Leucocytosis   Bacteremia due to Enterococcus    Bacteremia due to Klebsiella pneumoniae   Abnormal finding on GI tract imaging   Pancreatitis, acute   Dilated bile duct   Pancreatic necrosis   Superior mesenteric vein thrombosis   Time spent: 25 min  [facility]  [facility] Pager [phone]. If 7PM-7AM, please contact night-coverage at [email or web address], password [facility] 07/11/2012, 7:36 AM  LOS: 18 days

## 2012-07-11 NOTE — Care Management Note (Addendum)
    Page 1 of 1   07/15/2012     6:20:33 PM   CARE MANAGEMENT NOTE 07/15/2012  Patient:  Steven [name], Steven [name]   Account Number:  [id number]  Date Initiated:  06/25/2012  Documentation initiated by:  [facility]  Subjective/Objective Assessment:   62 year old male admitted with hip fracture after a fall.     Action/Plan:   Lived at [facility] PTA. Will most likely need SNF at d/c.   Anticipated DC Date:  07/15/2012   Anticipated DC Plan:  SKILLED NURSING FACILITY  In-house referral  Clinical Social Worker      DC Planning Services  CM consult      Choice offered to / List presented to:             Status of service:  Completed, signed off Medicare Important Message given?  NA - LOS <3 / Initial given by admissions (If response is "NO", the following Medicare IM given date fields will be blank) Date Medicare IM given:   Date Additional Medicare IM given:    Discharge Disposition:  SKILLED NURSING FACILITY  Per UR Regulation:  Reviewed for med. necessity/level of care/duration of stay  If discussed at Long Length of Stay Meetings, dates discussed:   07/13/2012  07/15/2012    Comments:  07/15/12 Priscilla Finklea RN,BSN NCM [phone] D/C [facility] W/TNA.  07/11/12 Keatin Benham RN,BSN NCM WEEKEND [phone] RECEIVED CALL FROM [facility] DONNA TEL#[phone] PATIENT ALREADY ACTIVE.NOTED PT-SNF.  06/28/12 RUTH MIRINGU RN BSN BC positive, IV antibiotics and ID following.

## 2012-07-11 NOTE — Progress Notes (Signed)
Physical Therapy Treatment Patient Details Name: Steven [name] MRN: [id number] DOB: Oct 11, 1950 Today's Date: 07/11/2012 Time: 4098-1191 PT Time Calculation (min): 23 min  PT Assessment / Plan / Recommendation Comments on Treatment Session   L hip ORIF 2nd fall.fx. 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 Assisted pt OOB  to chair. Pt progressing slowly (dementia is a factor), still requiring +2 total assist. Will need to D/C to SNF if [facility] is unable to provide appropriate care.    Follow Up Recommendations  SNF;Supervision/Assistance - 24 hour     Does the patient have the potential to tolerate intense rehabilitation     Barriers to Discharge        Equipment Recommendations  None recommended by PT    Recommendations for Other Services    Frequency Min 3X/week   Plan Discharge plan remains appropriate    Precautions / Restrictions Precautions Precaution Comments: L hemiparesis s/p CVA and L chronic humerus fx Restrictions Weight Bearing Restrictions: Yes LLE Weight Bearing: Partial weight bearing LLE Partial Weight Bearing Percentage or Pounds: 50%   Pertinent Vitals/Pain **Pt unable to rate pain 2* dementia. Pt stated, "It hurts like hell" with movement of LLE. Per RN pt has scheduled Tylenol, other pain meds being held due to confusion*    Mobility  Bed Mobility Bed Mobility: Supine to Sit Supine to Sit: 1: +2 Total assist Supine to Sit: Patient Percentage: 40% Details for Bed Mobility Assistance: Assist to elevate trunk, advance BLEs;  use of pad to swival hips around  Transfers Transfers: Stand Pivot Transfers Sit to Stand: 1: +2 Total assist Sit to Stand: Patient Percentage: 40% Stand to Sit: 1: +2 Total assist Stand to Sit: Patient Percentage: 40% Stand Pivot Transfers: 1: +2 Total assist Stand Pivot Transfers: Patient Percentage: 20% Details for Transfer Assistance: SPT with RW, pt able to WB on RLE but unable to initiate pivot Ambulation/Gait Ambulation/Gait  Assistance: Not tested (comment)    Exercises General Exercises - Lower Extremity Long Arc Quad: 5 reps;Left;AAROM;Seated (tolerance limited by pain)   PT Diagnosis:    PT Problem List:   PT Treatment Interventions:     PT Goals Acute Rehab PT Goals PT Goal Formulation: Patient unable to participate in goal setting Time For Goal Achievement: 07/25/12 Potential to Achieve Goals: Fair Pt will go Supine/Side to Sit: with mod assist PT Goal: Supine/Side to Sit - Progress: Goal set today Pt will Sit at Edge of Bed: with supervision;3-5 min;with no upper extremity support PT Goal: Sit at Edge Of Bed - Progress: Goal set today Pt will go Sit to Supine/Side: with mod assist PT Goal: Sit to Supine/Side - Progress: Goal set today Pt will go Sit to Stand: with mod assist PT Goal: Sit to Stand - Progress: Goal set today Pt will go Stand to Sit: with mod assist PT Goal: Stand to Sit - Progress: Goal set today Pt will Transfer Bed to Chair/Chair to Bed: with mod assist PT Transfer Goal: Bed to Chair/Chair to Bed - Progress: Goal set today  Visit Information  Last PT Received On: 07/11/12 Assistance Needed: +2    Subjective Data  Subjective: "I'm having a baby." -when asked if he knew why he was in the hospital Patient Stated Goal: none stated, pt confused   Cognition  Cognition Arousal/Alertness: Awake/alert Behavior During Therapy: Flat affect Overall Cognitive Status: History of cognitive impairments - at baseline    Balance     End of Session PT - End of  Session Equipment Utilized During Treatment: Gait belt Activity Tolerance: Patient tolerated treatment well Patient left: in chair;with call bell/phone within reach;with nursing in room Nurse Communication: Mobility status;Need for lift equipment   GP     Tamala Ser 07/11/2012, 8:30 AM [phone]

## 2012-07-12 LAB — GLUCOSE, CAPILLARY
Glucose-Capillary: 168 mg/dL — ABNORMAL HIGH (ref 70–99)
Glucose-Capillary: 178 mg/dL — ABNORMAL HIGH (ref 70–99)
Glucose-Capillary: 182 mg/dL — ABNORMAL HIGH (ref 70–99)

## 2012-07-12 LAB — COMPREHENSIVE METABOLIC PANEL
ALT: 15 U/L (ref 0–53)
Albumin: 2.5 g/dL — ABNORMAL LOW (ref 3.5–5.2)
Alkaline Phosphatase: 146 U/L — ABNORMAL HIGH (ref 39–117)
Chloride: 97 mEq/L (ref 96–112)
Potassium: 4.4 mEq/L (ref 3.5–5.1)
Sodium: 130 mEq/L — ABNORMAL LOW (ref 135–145)
Total Bilirubin: 0.6 mg/dL (ref 0.3–1.2)
Total Protein: 6.9 g/dL (ref 6.0–8.3)

## 2012-07-12 LAB — CBC
HCT: 30.1 % — ABNORMAL LOW (ref 39.0–52.0)
Hemoglobin: 9.6 g/dL — ABNORMAL LOW (ref 13.0–17.0)
MCH: 27 pg (ref 26.0–34.0)
MCHC: 31.9 g/dL (ref 30.0–36.0)
MCV: 84.6 fL (ref 78.0–100.0)
RBC: 3.56 MIL/uL — ABNORMAL LOW (ref 4.22–5.81)

## 2012-07-12 LAB — PREALBUMIN: Prealbumin: 14.7 mg/dL — ABNORMAL LOW (ref 17.0–34.0)

## 2012-07-12 LAB — DIFFERENTIAL
Basophils Relative: 0 % (ref 0–1)
Eosinophils Absolute: 0.2 10*3/uL (ref 0.0–0.7)
Lymphs Abs: 2 10*3/uL (ref 0.7–4.0)
Monocytes Absolute: 1.5 10*3/uL — ABNORMAL HIGH (ref 0.1–1.0)
Monocytes Relative: 11 % (ref 3–12)
Neutro Abs: 9.6 10*3/uL — ABNORMAL HIGH (ref 1.7–7.7)
Neutrophils Relative %: 72 % (ref 43–77)

## 2012-07-12 LAB — TRIGLYCERIDES: Triglycerides: 88 mg/dL (ref <150)

## 2012-07-12 MED ORDER — METOPROLOL TARTRATE 1 MG/ML IV SOLN
10.0000 mg | INTRAVENOUS | Status: DC | PRN
  Administered 2012-07-12 – 2012-07-14 (×4): 10 mg via INTRAVENOUS
  Filled 2012-07-12 (×2): qty 10
  Filled 2012-07-12 (×4): qty 5

## 2012-07-12 MED ORDER — TRACE MINERALS CR-CU-F-FE-I-MN-MO-SE-ZN IV SOLN
INTRAVENOUS | Status: AC
  Administered 2012-07-12: 17:00:00 via INTRAVENOUS
  Filled 2012-07-12: qty 2000

## 2012-07-12 MED ORDER — FAT EMULSION 20 % IV EMUL
250.0000 mL | INTRAVENOUS | Status: AC
  Administered 2012-07-12: 250 mL via INTRAVENOUS
  Filled 2012-07-12: qty 250

## 2012-07-12 NOTE — Progress Notes (Signed)
17 Days Post-Op  Subjective: Resting and does not seem uncomfortable.  When ask if her hurts he says yes and points to stomach.  He also complains of pain any place you touch him including the chest and shoulder.  I ask him if clears made him sick and he didn't know.  Objective: Vital signs in last 24 hours: Temp:  [97.3 F (36.3 C)-98.4 F (36.9 C)] 97.3 F (36.3 C) (05/05 0654) Pulse Rate:  [81-108] 81 (05/05 0654) Resp:  [18] 18 (05/05 0654) BP: (93-114)/(69-89) 111/69 mmHg (05/05 0654) SpO2:  [87 %-100 %] 96 % (05/05 0654) Last BM Date: 07/12/12 Diet: clears TNA for PCM WBC is up again,  Na low  Intake/Output from previous day: 05/04 0701 - 05/05 0700 In: 2549.3 [P.O.:270; I.V.:120; IV Piggyback:200; TPN:1959.3] Out: 1550 [Urine:1550] Intake/Output this shift: Total I/O In: 100 [IV Piggyback:100] Out: -   General appearance: alert, appears older than stated age, no distress and confused GI: soft, complains of pain any place you touch him, no increase with palpation.  No guarding.  Lab Results:   Recent Labs  07/10/12 0535 07/12/12 0540  WBC 9.9 13.3*  HGB 9.0* 9.6*  HCT 27.7* 30.1*  PLT 504* 522*    BMET  Recent Labs  07/11/12 0320 07/12/12 0540  NA 130* 130*  K 4.1 4.4  CL 97 97  CO2 24 24  GLUCOSE 149* 158*  BUN 11 15  CREATININE 0.51 0.47*  CALCIUM 8.3* 8.6   PT/INR No results found for this basename: LABPROT, INR,  in the last 72 hours   Recent Labs Lab 07/06/12 0440 07/08/12 0625 07/12/12 0540  AST 28 19 26   ALT 24 16 15   ALKPHOS 176* 131* 146*  BILITOT 0.7 0.6 0.6  PROT 5.2* 5.3* 6.9  ALBUMIN 1.9* 1.9* 2.5*     Lipase     Component Value Date/Time   LIPASE 38 07/04/2012 0510     Studies/Results: No results found.  Medications: . acetaminophen  1,000 mg Oral TID  . cholecalciferol  1,000 Units Oral q morning - 10a  . clopidogrel  75 mg Oral Q breakfast  . enoxaparin (LOVENOX) injection  100 mg Subcutaneous Q24H  .  imipenem-cilastatin  500 mg Intravenous Q8H  . insulin aspart  0-9 Units Subcutaneous Q6H  . lip balm  1 application Topical BID  . metoprolol tartrate  50 mg Oral BID  . pantoprazole  40 mg Oral Daily  . potassium chloride  20 mEq Oral Daily  . psyllium  1 packet Oral BID  . saccharomyces boulardii  250 mg Oral BID  . 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  LOS: 19 days    Steven [name] 07/12/2012

## 2012-07-12 NOTE — Progress Notes (Signed)
Pt refusing to take all po medications. He repeatedly says "no" and "stop" . Pt's HR 139, MD notified of increased HR and refusal of meds. New orders for metoprolol IV. New orders to obtain EKG and place pt back on telemetry for ST. Will continue to monitor.  Earnest Conroy. Clelia Croft, RN

## 2012-07-12 NOTE — Progress Notes (Signed)
ANTICOAGULATION CONSULT NOTE - Follow Up Consult  Pharmacy Consult for Lovenox Indication: SMV and splenic vein thrombosis   Allergies  Allergen Reactions  . Cefazolin Rash  . Penicillins     REACTION: unknown allergy - hx since childhood    Patient Measurements: Height: 5' 1.81" (157 cm) Weight: 147 lb 4.3 oz (66.8 kg) IBW/kg (Calculated) : 54.17  Labs:  Recent Labs  07/10/12 0535 07/11/12 0320 07/12/12 0540  HGB 9.0*  --  9.6*  HCT 27.7*  --  30.1*  PLT 504*  --  522*  CREATININE  --  0.51 0.47*    Assessment: 62 yo M who had ORIF on 4/18, started prophylaxis Lovenox post-op and found on 4/29 with thrombosis in SMV and splenic vein (likely 2/2 acute pancreatitis) - Lovenox to therapeutic dosing.  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 Per ID rec from 5/2 note: Continue imipenem for 10-14 days at least and reevaluate clinically and with CT prior to considering stopping antibiotics or per surgery recommendations    Patient is currently receiving Lovenox 70 mg sq q12h.  Plan discharge when SNF bed available.  Patient will not be able to tolerate orals for the next few weeks so MD will continue Lovenox outpatient for now.   Renal function stable.  Hgb 9.6, low but stable, plts = 522.  No bleeding.    Goal of Therapy:  Anti-Xa level 0.6-1.2 units/ml 4hrs after LMWH dose given Monitor platelets by anticoagulation protocol: Yes   Plan:   Continue Lovenox to 100 mg sq q24h with next dose for outpatient convenience  CBC at least q72h while in hospital  Juliette Alcide, PharmD, BCPS.   Pager: [phone] 07/12/2012 8:39 AM

## 2012-07-12 NOTE — Progress Notes (Signed)
[facility] PROGRESS NOTE  FARHAN JEAN WRU:[id number] DOB: January 07, 1951 DOA: 06/23/2012 PCP: Lehman Prom, NP  Interval history 62 year old male patient, nursing home resident, PMH of CAD, dementia, CVA admitted on 06/24/12 after an unwitnessed fall. In the ED, x-rays revealed left hip fracture. He does have chronic left humerus fracture. Patient nonambulatory per family. He probably has dementia. Orthopedics was consulted and patient underwent ORIF of left hip fracture on 4/18. Patient had sinus tachycardia early during hospitalization. CTA chest negative for PE. TSH normal. Patient was found to have polymicrobial bacteremia due to enterococcus and Klebsiella. He was treated with broad-spectrum IV antibiotics-aztreonam, vancomycin and Levaquin for presumed healthcare acquired pneumonia. Infectious disease was consulted. CT abdomen showed necrotizing pancreatitis/abscess in the area of head of pancreas-likely source of bacteremia. A Gastrografin study did not show any bowel perforation. His antibiotics were narrowed to imipenem. Despite this, patient continued to have fevers and worsening leukocytosis. Surgeons were consulted. Patient has clinically improved. Surgeons, GI and ID continue to follow patient. Current plan is for n.p.o. except sips of clear liquids, TPN and prolonged antibiotics and we image with CT abdomen in 3-4 weeks and further plans at that time. He was also found to have splenic vein/SMA thrombosis-anticoagulation and is on full dose Lovenox. He eventually we'll need cholecystectomy. Etiology of pancreatitis-chronic alcoholic pancreatitis versus rule out malignancy or an diagnosed ulcer. Currently patient is medically stable and awaiting SNF  Assessment/Plan:                                                                                                             Necrotizing pancreatitis - fever and leukocytosis resolved-now mild leukocytosis of unclear etiology. - surgery  following- no surgical planned at this time. - US abdomen show gallstones which may suggest an etiology. Per surgery, no need for acute cholecystectomy, repeat imaging in 6 weeks ad plan for cholecystectomy at that time if there is no pseudocyst formation. - Continue PICC, TPN and sips of clear liquids and IV antibiotics. IV imipenem for 2 weeks at least and reevaluate clinically and with CT prior to considering stopping antibiotics (ID recommendation)-surgery indicates CT repeat in 3-4 weeks and may need antibiotics until then.  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 Plan discussed with GI as well.  Started on full dose Lovenox.  Polymicrobial bacteremia/Leukocytosis - C diff negative. patient is afebrile. Chest x-ray and UA are unremarkable except the chest x-ray showing mild congestion. He was found to have polymicrobial bacteremia with Positive Blood cultures showing enterococcus and klebsiella. ID consulted for further recommendations. CT abd and pelvis was done and showed necrotizing pancreatitis. Started him on primaxin. - ID following, appreciate input - continue antibiotics, appreciate ID input regarding total length.  - Has PICC line. Continue Primaxin  Left femoral intertrochanteric fracture -  - Underwent ORIF of the left femur on 4/18. PT/OT evaluation, plan for SNF placement. Pain control. Outpatient followup with orthopedics in  2 weeks.  Sinus tachycardia - May be pain, related due to dehydration or most likely due to #1.  CT angiogram of the chest to rule out PE was negative. Free t4 and TSH within normal limits. Echocardiogram showed good LVEF without any wall abn. His tachycardia improved and he is on b blockers. Stable, discontinue telemetry. Likely secondary to bacteremia.  History of CVA with left-sided hemiparesis - initially plavix held  For surgery of the  left hip. Back on Plavix, may need to be stopped again if planning chole.   Dementia - no acute issues.  Chronic left humerus fracture. no acute issues.   Hypokalemia; replete as needed   Anemia: Likely multifactorial-chronic disease, acute illness and phlebotomies. Follow CBC name. Transfuse if hemoglobin less than 7 g/dL  Hyponatremia: Clinically euvolemic.? SIADH. Monitor.  DVT prophylaxis on lovenox.   Code Status: Full code Family Communication: Discussed with patient's sister, [name] 5/2 Disposition Plan: SNF when medically stable  PROCEDURE:   Open reduction and internal fixation of the left comminuted intertrochanteric femur fracture on 4/18  PICC line  Consultants:  Cardiology for tachycardic  Orthopedics   ID Dr Orvan Falconer.  GI Dr. Arlyce Dice  General surgery  HPI/Subjective: Patient denies complaints.   Objective: Filed Vitals:   07/11/12 1451 07/11/12 2123 07/12/12 0002 07/12/12 0654  BP: 93/71 114/89 107/77 111/69  Pulse: 101 102 108 81  Temp: 98.1 F (36.7 C) 98.4 F (36.9 C)  97.3 F (36.3 C)  TempSrc: Axillary Axillary  Axillary  Resp: 18 18  18   Height:      Weight:      SpO2: 100% 87%  96%    Intake/Output Summary (Last 24 hours) at 07/12/12 1557 Last data filed at 07/12/12 9518  Gross per 24 hour  Intake 1709.27 ml  Output   1550 ml  Net 159.27 ml   Filed Weights   06/24/12 0837 07/05/12 0705 07/06/12 0500  Weight: 64.411 kg (142 lb) 64.819 kg (142 lb 14.4 oz) 66.8 kg (147 lb 4.3 oz)    Exam: General: NAD Cardiovascular: S1-S2, RRR. No JVD, murmurs or pedal edema. Respiratory: Reduced breath sounds in the bases but otherwise clear to auscultation/poor inspiratory effort. No increased work of breathing. Abdomen: Nondistended and soft. Nontender. Normal bowel sounds heard. Musculoskeletal: no edema Neurologic: Alert and oriented to self and place. No new focal deficits. Chronic left hemiparesis  Data Reviewed: Basic  Metabolic Panel:  Recent Labs Lab 07/06/12 0440 07/08/12 0625 07/09/12 0440 07/11/12 0320 07/12/12 0540  NA 133* 133* 133* 130* 130*  K 3.4* 3.1* 3.4* 4.1 4.4  CL 99 97 100 97 97  CO2 26 29 25 24 24   GLUCOSE 89 146* 149* 149* 158*  BUN 7 5* 7 11 15   CREATININE 0.57 0.54 0.53 0.51 0.47*  CALCIUM 7.7* 7.6* 7.8* 8.3* 8.6  MG 1.9 1.8  --   --  1.9  PHOS  --  3.2  --   --  3.3   Liver Function Tests:  Recent Labs Lab 07/06/12 0440 07/08/12 0625 07/12/12 0540  AST 28 19 26   ALT 24 16 15   ALKPHOS 176* 131* 146*  BILITOT 0.7 0.6 0.6  PROT 5.2* 5.3* 6.9  ALBUMIN 1.9* 1.9* 2.5*   No results found for this basename: LIPASE, AMYLASE,  in the last 168 hours CBC:  Recent Labs Lab 07/07/12 0500 07/08/12 0625 07/09/12 0440 07/10/12 0535 07/12/12 0540  WBC 7.2 7.0 7.4 9.9 13.3*  NEUTROABS  --  4.7  --   --  9.6*  HGB 8.3* 8.7* 8.5* 9.0* 9.6*  HCT 25.6* 25.7* 26.4* 27.7* 30.1*  MCV 83.4 82.6 83.3 83.9 84.6  PLT 357 414* 443* 504* 522*   BNP (last 3 results)  Recent Labs  06/24/12 0405 06/24/12 0551  PROBNP 1874.0* 1830.0*   No results found for this or any previous visit (from the past 240 hour(s)).   Studies: No results found.  Scheduled Meds: . acetaminophen  1,000 mg Oral TID  . cholecalciferol  1,000 Units Oral q morning - 10a  . clopidogrel  75 mg Oral Q breakfast  . enoxaparin (LOVENOX) injection  100 mg Subcutaneous Q24H  . imipenem-cilastatin  500 mg Intravenous Q8H  . insulin aspart  0-9 Units Subcutaneous Q6H  . lip balm  1 application Topical BID  . metoprolol tartrate  50 mg Oral BID  . pantoprazole  40 mg Oral Daily  . potassium chloride  20 mEq Oral Daily  . psyllium  1 packet Oral BID  . saccharomyces boulardii  250 mg Oral BID  . tamsulosin  0.4 mg Oral QHS   Continuous Infusions: . Marland KitchenTPN (CLINIMIX-E) Adult     And  . fat emulsion    . lactated ringers 20 mL/hr at 07/12/12 1332  . Marland KitchenTPN (CLINIMIX-E) Adult 83 mL/hr at 07/11/12 1800     Principal Problem:   Closed left hip fracture Active Problems:   CVA   Dementia   Fracture of left humerus   Leucocytosis   Bacteremia due to Enterococcus   Bacteremia due to Klebsiella pneumoniae   Abnormal finding on GI tract imaging   Pancreatitis, acute   Dilated bile duct   Pancreatic necrosis   Superior mesenteric vein thrombosis   Time spent: 25 min  [facility]  [facility] Pager [phone]. If 7PM-7AM, please contact night-coverage at [email or web address], password [facility] 07/12/2012, 3:57 PM  LOS: 19 days

## 2012-07-12 NOTE — Progress Notes (Signed)
PARENTERAL NUTRITION CONSULT NOTE - Follow Up  Pharmacy Consult for TNA Indication: severe nectrotizing pancreatitis  Allergies  Allergen Reactions  . Cefazolin Rash  . Penicillins     REACTION: unknown allergy - hx since childhood    Patient Measurements: Height: 5' 1.81" (157 cm) Weight: 147 lb 4.3 oz (66.8 kg) IBW/kg (Calculated) : 54.17 Adjusted Body Weight: 58 kg  Vital Signs: Temp: 97.3 F (36.3 C) (05/05 0654) Temp src: Axillary (05/05 0654) BP: 111/69 mmHg (05/05 0654) Pulse Rate: 81 (05/05 0654) Intake/Output from previous day: 05/04 0701 - 05/05 0700 In: 1843.8 [P.O.:270; I.V.:120; IV Piggyback:200; TPN:1253.8] Out: 1550 [Urine:1550] Intake/Output from this shift:    Labs:  Recent Labs  07/10/12 0535 07/12/12 0540  WBC 9.9 13.3*  HGB 9.0* 9.6*  HCT 27.7* 30.1*  PLT 504* 522*     Recent Labs  07/11/12 0320 07/12/12 0540  NA 130* 130*  K 4.1 4.4  CL 97 97  CO2 24 24  GLUCOSE 149* 158*  BUN 11 15  CREATININE 0.51 0.47*  CALCIUM 8.3* 8.6  MG  --  1.9  PHOS  --  3.3  PROT  --  6.9  ALBUMIN  --  2.5*  AST  --  26  ALT  --  15  ALKPHOS  --  146*  BILITOT  --  0.6  TRIG  --  88  CHOL  --  86   Estimated Creatinine Clearance: 81.2 ml/min (by C-G formula based on Cr of 0.47).    Recent Labs  07/11/12 1643 07/12/12 0041 07/12/12 0653  GLUCAP 183* 180* 182*    Nutritional Goals:  - RD recs 4/30: 2020 - 2340 Kcal, 107-120 grams protein, 2.2 L fluid - Clinimix E 5/20 @ 90 ml/hr + IVF 20% at 10 ml/hr on MWF will provide: 108 g protein, average of 2107 Kcal per day.  Current nutrition:  - Diet: Clear liquid starting 4/26 - TNA: Clinimix E 5/20 @ 83 ml/hr - mIVF: LR @ KVO  CBGs & Insulin requirements past 24 hours:  - CBGs 166-183, 8 unit of Novolog SSI/24h -10 units regular insulin/L (=20 units/bag) -Dextrose infusion rate = 4.7 mg/kg/min  Labs:  Electrolytes: stable with Na low at 130 (likely d/t TPN but unable to adjust) Renal  Function: Scr wnl/stable, UOP 1 ml/kg/hr last 24hr Hepatic Function: Alk phos elevated  @ 146 (prior to TNA initiation)  Pre-Albumin: 6.8 on 5/1 Triglycerides: 171 (5/1), 88 (5/5) CBGs: remain elevated despite addition of regular insulin to TPN   Plan:  At 1800 tonight  Continue TNA Clinimix E 5/20 @ rate 83 ml/hr   TNA to contain IV fat emulsion 20% at 10 ml/hr on MWF only due to ongoing shortage  MVI and trace element to TNA MWF.    Increase regular insulin to 12.5 units/L (=25 units/bag)  TNA labs Monday/Thursdays  Plan SNF at discharge (likely Wed) - appears staff at SNF is being educated on TPNs  Juliette Alcide, PharmD, BCPS.   Pager: [phone] 07/12/2012 8:24 AM

## 2012-07-12 NOTE — Progress Notes (Signed)
General surgery attending note:  I personally interviewed and examined this patient. I agree with the assessment and treatment plan outlined by Mr. Marlyne Beards, [place].  LFTs essentially normal. WBC 13,300. Cause of leukocytosis not clear.  His abdomen is essentially soft. I do not feel a mass. He is afebrile.  Plan: Continue antibiotic therapy. Continue Lovenox for thrombosis in the SMV and splenic vein. Repeat CT scan in 3-4 weeks, follow up with Dr. Luisa Hart  in office as needed. Avoid surgical intervention, unless he develops sepsis secondary to his pancreatitis.   Angelia Mould. Derrell Lolling, M.D., [facility] General and Minimally invasive Surgery Breast and Colorectal Surgery Office:   [phone] Pager:   [phone]

## 2012-07-12 NOTE — Progress Notes (Signed)
Pt has refused all po intake for me today except for one bit of ice cream this am with his plavix and pain medication. He also took one sip of juice at that time. Since then he has refused any po's. I have offered many different choices and offered many different times, but pt continuously refuses and pushes my hand away and states "I can't and stop."

## 2012-07-13 ENCOUNTER — Encounter: Payer: Self-pay | Admitting: Internal Medicine

## 2012-07-13 ENCOUNTER — Ambulatory Visit (HOSPITAL_COMMUNITY): Payer: Medicaid Other

## 2012-07-13 LAB — CBC
HCT: 27.6 % — ABNORMAL LOW (ref 39.0–52.0)
Hemoglobin: 8.9 g/dL — ABNORMAL LOW (ref 13.0–17.0)
MCH: 27.3 pg (ref 26.0–34.0)
MCHC: 32.2 g/dL (ref 30.0–36.0)
MCV: 84.7 fL (ref 78.0–100.0)
Platelets: 414 10*3/uL — ABNORMAL HIGH (ref 150–400)
RBC: 3.26 MIL/uL — ABNORMAL LOW (ref 4.22–5.81)
RDW: 14.2 % (ref 11.5–15.5)
WBC: 9.7 10*3/uL (ref 4.0–10.5)

## 2012-07-13 LAB — GLUCOSE, CAPILLARY: Glucose-Capillary: 144 mg/dL — ABNORMAL HIGH (ref 70–99)

## 2012-07-13 MED ORDER — CAMPHOR-MENTHOL 0.5-0.5 % EX LOTN
TOPICAL_LOTION | CUTANEOUS | Status: DC | PRN
  Administered 2012-07-14: 1 via TOPICAL
  Filled 2012-07-13: qty 222

## 2012-07-13 MED ORDER — IOHEXOL 300 MG/ML  SOLN
100.0000 mL | Freq: Once | INTRAMUSCULAR | Status: AC | PRN
  Administered 2012-07-13: 100 mL via INTRAVENOUS

## 2012-07-13 MED ORDER — INSULIN REGULAR HUMAN 100 UNIT/ML IJ SOLN
INTRAMUSCULAR | Status: AC
  Administered 2012-07-13: 17:00:00 via INTRAVENOUS
  Filled 2012-07-13: qty 2000

## 2012-07-13 NOTE — Progress Notes (Signed)
PARENTERAL NUTRITION CONSULT NOTE - Follow Up  Pharmacy Consult for TNA Indication: severe nectrotizing pancreatitis  Allergies  Allergen Reactions  . Cefazolin Rash  . Penicillins     REACTION: unknown allergy - hx since childhood    Patient Measurements: Height: 5' 1.81" (157 cm) Weight: 147 lb 4.3 oz (66.8 kg) IBW/kg (Calculated) : 54.17 Adjusted Body Weight: 58 kg  Vital Signs: Temp: 98.3 F (36.8 C) (05/06 0505) Temp src: Oral (05/06 0505) BP: 99/66 mmHg (05/06 0505) Pulse Rate: 110 (05/06 0505) Intake/Output from previous day: 05/05 0701 - 05/06 0700 In: 3480.2 [P.O.:100; I.V.:580.7; IV Piggyback:300; TPN:2499.5] Out: 1400 [Urine:1400] Intake/Output from this shift: Total I/O In: 1007.8 [I.V.:160.7; IV Piggyback:100; TPN:747.1] Out: -   Labs:  Recent Labs  07/12/12 0540  WBC 13.3*  HGB 9.6*  HCT 30.1*  PLT 522*     Recent Labs  07/11/12 0320 07/12/12 0540  NA 130* 130*  K 4.1 4.4  CL 97 97  CO2 24 24  GLUCOSE 149* 158*  BUN 11 15  CREATININE 0.51 0.47*  CALCIUM 8.3* 8.6  MG  --  1.9  PHOS  --  3.3  PROT  --  6.9  ALBUMIN  --  2.5*  AST  --  26  ALT  --  15  ALKPHOS  --  146*  BILITOT  --  0.6  PREALBUMIN  --  14.7*  TRIG  --  88  CHOL  --  86   Estimated Creatinine Clearance: 81.2 ml/min (by C-G formula based on Cr of 0.47).    Recent Labs  07/12/12 2220 07/12/12 2353 07/13/12 0540  GLUCAP 168* 168* 144*   Assessment: 61 yom with cecrotizing pancreatitis, [place] abd with gallstones. Surgery on board, no need for acute cholecystectomy. Plan repeat imaging in 6 weeks and plan for cholecystectomy at that time if there is no pseudocyst formation. Surgery would like to start TNA since patient will not be able to tolerate anything more than clears for weeks. PICC placed 4/29, TNA started 4/30.  5/6: D#7 TNA - MD plans TNA for 3-4 weeks upon discharge before starting PO. Plan SNF on wed. Refuses most oral intake. Prealbumin  improving  Nutritional Goals:  - RD recs 4/30: 2020 - 2340 Kcal, 107-120 grams protein, 2.2 L fluid - Clinimix E 5/20 @ 90 ml/hr + IVF 20% at 10 ml/hr on MWF will provide: 108 g protein, average of 2107 Kcal per day.  Current nutrition:  - Diet: Clear liquid starting 4/26 - TNA: Clinimix E 5/20 @ 83 ml/hr - mIVF: LR @ KVO  CBGs & Insulin requirements past 24 hours:  - CBGs: 144-188, 9 unit of Novolog SSI/24h -12.5 units regular insulin/L (=25 units/bag) -Dextrose infusion rate = 4.7 mg/kg/min  Labs:  Electrolytes: No labs 5/6, stable with Na low at 130 (likely d/t TPN but unable to adjust) Renal Function: Scr wnl/stable, good UOP Hepatic Function: Alk phos elevated  @ 146 (prior to TNA initiation)  Pre-Albumin: 6.8 (5/1), 14.7 (5/5) Triglycerides: 171 (5/1), 88 (5/5) CBGs: remain elevated despite addition of regular insulin to TPN  Plan:  At 1800 tonight  Continue TNA Clinimix E 5/20 @ rate 83 ml/hr   TNA to contain IV fat emulsion 20% at 10 ml/hr on MWF only due to ongoing shortage  MVI and trace element to TNA MWF.    Increase regular insulin to 15 units/L (=30 units/bag)  TNA labs Monday/Thursdays  Plan SNF at discharge (likely Wed) - appears staff  at SNF is being educated on TPNs. Spoke with social work and will plan to continue CONTINUOUS TPN d/t SNF staff being unfamiliar with TPNs.  Do not see a problem with this if plan is TPN X 3-4 wks  Labs in am  Juliette Alcide, PharmD, BCPS.   Pager: [phone] 07/13/2012 8:04 AM

## 2012-07-13 NOTE — Progress Notes (Signed)
Patient keeps scratching a rash located on his buttocks and perineal area. Area is very red and tender. RN put green mitts on patient to prevent him from scratching. Patient is also incontinent of urine and keeps pulling off the condom catheters. Patient is now resting comfortably in bed.

## 2012-07-13 NOTE — Progress Notes (Signed)
Pt's HR sustaining at 123, prn metoprolol IV given. Pt's HR now in the low 100s. Pt resting comfortably, will continue to monitor.  Earnest Conroy. Clelia Croft, RN

## 2012-07-13 NOTE — Progress Notes (Signed)
Pt still refusing all po intake today. I have attempted multiple times to give pt his meds crushed in applesause (just one bite), but pt refuses to take even one bite of food or a sip of liquid. Pt has been encouraged to eat with every meal but he refuses. Pt has been instructed by MDs that he needs to eat in order to heal, but he still refuses. MD aware that pt is refusing po intake. Will continue to encourage pt to eat/drink.   Arta Bruce [facility] 07/13/2012 4:22 PM

## 2012-07-13 NOTE — Progress Notes (Signed)
18 Days Post-Op  Subjective: When you ask him if he hurts he says his chest, but point to his upper abdomen.  He's more focused this AM.  Doesn't complain if you touch his chest.  He does complain if you touch his hips, either side. Asked if food make him better or worse he says he does not know.  Objective: Vital signs in last 24 hours: Temp:  [98.3 F (36.8 C)-98.4 F (36.9 C)] 98.3 F (36.8 C) (05/06 0505) Pulse Rate:  [110-139] 110 (05/06 0505) Resp:  [14-18] 14 (05/06 0505) BP: (99-121)/(64-66) 99/66 mmHg (05/06 0505) SpO2:  [94 %-99 %] 94 % (05/06 0505) Last BM Date: 07/12/12 PO 100 ml recorded, Afebrile, Treated with IV lopressor last PM for tachycardia(currently HR around 100 SR) Day 15 of imipenem-cilastatin No labs Last CT 4/28  Intake/Output from previous day: 05/05 0701 - 05/06 0700 In: 3480.2 [P.O.:100; I.V.:580.7; IV Piggyback:300; TPN:2499.5] Out: 1400 [Urine:1400] Intake/Output this shift: Total I/O In: 1007.8 [I.V.:160.7; IV Piggyback:100; TPN:747.1] Out: -   General appearance: alert, no distress and he will complain if you ask, and if you touch his abdomen. GI: soft, no distension, says he's tender, no peritonitis  Lab Results:   Recent Labs  07/12/12 0540  WBC 13.3*  HGB 9.6*  HCT 30.1*  PLT 522*    BMET  Recent Labs  07/11/12 0320 07/12/12 0540  NA 130* 130*  K 4.1 4.4  CL 97 97  CO2 24 24  GLUCOSE 149* 158*  BUN 11 15  CREATININE 0.51 0.47*  CALCIUM 8.3* 8.6   PT/INR No results found for this basename: LABPROT, INR,  in the last 72 hours   Recent Labs Lab 07/08/12 0625 07/12/12 0540  AST 19 26  ALT 16 15  ALKPHOS 131* 146*  BILITOT 0.6 0.6  PROT 5.3* 6.9  ALBUMIN 1.9* 2.5*     Lipase     Component Value Date/Time   LIPASE 38 07/04/2012 0510     Studies/Results: No results found.  Medications: . acetaminophen  1,000 mg Oral TID  . cholecalciferol  1,000 Units Oral q morning - 10a  . clopidogrel  75 mg Oral Q  breakfast  . enoxaparin (LOVENOX) injection  100 mg Subcutaneous Q24H  . imipenem-cilastatin  500 mg Intravenous Q8H  . insulin aspart  0-9 Units Subcutaneous Q6H  . lip balm  1 application Topical BID  . metoprolol tartrate  50 mg Oral BID  . pantoprazole  40 mg Oral Daily  . potassium chloride  20 mEq Oral Daily  . psyllium  1 packet Oral BID  . saccharomyces boulardii  250 mg Oral BID  . tamsulosin  0.4 mg Oral QHS    Assessment/Plan . Acute necrotizing pancreatitis- US shows gallstones and mild biliary dilatation so gallstone etiology likely.  Repeat CT still show acute pancreatitis, pancreatic necroisis at The head and tail of the pancreas.necrosis or abscess.  Last CT 07/05/12 Day 15 of Imipenem-cilastatin yesterday. 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 Positive bacteremia of uncertain etiology. Been followed by ID.  3. 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  4. Significant dementia; currently resides at a skilled nursing facility.  5. Prior history of alcohol and substance abuse.  6. Fall with left hip ORIF 4/17 Dr. Charlann Boxer  7. Urinary incontinence/hypertonicity of the bladder.  8. Prior fracture of the left humerus, with history of multiple trauma/crush fracture.  9. Lumbar spondylitis  Plan: recheck CBC and if still  up repeat CT scan today. Repeat CBC shows WBC is down. CBC    Component Value Date/Time   WBC 9.7 07/13/2012 0805   RBC 3.26* 07/13/2012 0805   HGB 8.9* 07/13/2012 0805   HCT 27.6* 07/13/2012 0805   PLT 414* 07/13/2012 0805   MCV 84.7 07/13/2012 0805   MCH 27.3 07/13/2012 0805   MCHC 32.2 07/13/2012 0805   RDW 14.2 07/13/2012 0805   LYMPHSABS 2.0 07/12/2012 0540   MONOABS 1.5* 07/12/2012 0540   EOSABS 0.2 07/12/2012 0540   BASOSABS 0.1 07/12/2012 0540       LOS: 20 days    Ceairra Mccarver 07/13/2012

## 2012-07-13 NOTE — Progress Notes (Signed)
Pt's HR sustaining in the low 130's. MD paged to make aware. BP 106/75 at this time and pt was resting calmly in the bed. Pain medication had just been given 30 minutes prior. Order was on [facility] to give metoprolol IV 10mg  for HR sustaining greater than 115. Metoprolol given and HR now sustaining 100-103. Pt is refusing po intake and thus is not receiving his oral metoprolol BID as ordered. This could be contributing to increase in HR since last night. Will continue to monitor pt and vital signs.   Arta Bruce [facility] 07/13/2012

## 2012-07-13 NOTE — Progress Notes (Signed)
[facility] PROGRESS NOTE  Steven [name] ION:[id number] DOB: 1950-07-18 DOA: 06/23/2012 PCP: Lehman Prom, NP  Interval history 62 year old male patient, nursing home resident, PMH of CAD, dementia, CVA admitted on 06/24/12 after an unwitnessed fall. In the ED, x-rays revealed left hip fracture. He does have chronic left humerus fracture. Patient nonambulatory per family. He probably has dementia. Orthopedics was consulted and patient underwent ORIF of left hip fracture on 4/18. Patient had sinus tachycardia early during hospitalization. CTA chest negative for PE. TSH normal. Patient was found to have polymicrobial bacteremia due to enterococcus and Klebsiella. He was treated with broad-spectrum IV antibiotics-aztreonam, vancomycin and Levaquin for presumed healthcare acquired pneumonia. Infectious disease was consulted. CT abdomen showed necrotizing pancreatitis/abscess in the area of head of pancreas-likely source of bacteremia. A Gastrografin study did not show any bowel perforation. His antibiotics were narrowed to imipenem. Despite this, patient continued to have fevers and worsening leukocytosis. Surgeons were consulted. Patient has clinically improved. Surgeons, GI and ID continue to follow patient. Current plan is for n.p.o. except sips of clear liquids, TPN and prolonged antibiotics and we image with CT abdomen in 3-4 weeks and further plans at that time. He was also found to have splenic vein/SMA thrombosis-anticoagulation and is on full dose Lovenox. He eventually we'll need cholecystectomy. Etiology of pancreatitis-chronic alcoholic pancreatitis versus rule out malignancy or an diagnosed ulcer. Currently patient is medically stable and awaiting SNF  Assessment/Plan:                                                                                                             Necrotizing pancreatitis - fever and leukocytosis resolved-now mild leukocytosis of unclear etiology. - surgery  following- no surgical planned at this time. - US abdomen show gallstones which may suggest an etiology. Per surgery, no need for acute cholecystectomy, repeat imaging in 6 weeks ad plan for cholecystectomy at that time if there is no pseudocyst formation. - Continue PICC, TPN and sips of clear liquids and IV antibiotics. As discussed with Dr. Luciana Axe on 07/13/12, continue IV imipenem until reevaluated clinically and by repeat CT by surgeons in 3-4 weeks . 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 Plan discussed with GI as well.  Started on full dose Lovenox.  Polymicrobial bacteremia/Leukocytosis - C diff negative. patient is afebrile. Chest x-ray and UA are unremarkable except the chest x-ray showing mild congestion. He was found to have polymicrobial bacteremia with Positive Blood cultures showing enterococcus and klebsiella. ID consulted for further recommendations. CT abd and pelvis was done and showed necrotizing pancreatitis. Started him on primaxin. - ID following, appreciate input - continue antibiotics, appreciate ID input regarding total length.  - Has PICC line. Continue Primaxin-duration as indicated above. Outpatient followup with infectious disease.  Left femoral intertrochanteric fracture -  - Underwent ORIF of the left femur on 4/18. PT/OT  evaluation, plan for SNF placement. Pain control. Outpatient followup with orthopedics in 2 weeks.  Sinus tachycardia - May be pain, related due to dehydration or most likely due to #1.  CT angiogram of the chest to rule out PE was negative. Free t4 and TSH within normal limits. Echocardiogram showed good LVEF without any wall abn. His tachycardia improved and he is on b blockers. Stable, discontinue telemetry. Likely secondary to bacteremia. Patient had  mild tachycardia overnight-unclear etiology-not really asymptomatic.  History of CVA with left-sided hemiparesis - initially plavix held  For surgery of the left hip. Back on Plavix, may need to be stopped again if planning chole.   Dementia - no acute issues.  Chronic left humerus fracture. no acute issues.   Hypokalemia; replete as needed   Anemia: Likely multifactorial-chronic disease, acute illness and phlebotomies. Follow CBC name. Transfuse if hemoglobin less than 7 g/dL  Hyponatremia: Clinically euvolemic.? SIADH. Monitor.  DVT prophylaxis on lovenox.   Code Status: Full code Family Communication: Discussed with patient's sister, [name] 5/2 Disposition Plan: SNF when medically stable  PROCEDURE:   Open reduction and internal fixation of the left comminuted intertrochanteric femur fracture on 4/18  PICC line  Consultants:  Cardiology for tachycardic  Orthopedics   ID Dr Orvan Falconer.  GI Dr. Arlyce Dice  General surgery  HPI/Subjective: Patient denies complaints. Per nursing, refuses to eat despite coaxing.  Objective: Filed Vitals:   07/12/12 0654 07/12/12 2134 07/13/12 0505 07/13/12 1352  BP: 111/69 121/64 99/66   Pulse: 81 139 110 99  Temp: 97.3 F (36.3 C) 98.4 F (36.9 C) 98.3 F (36.8 C) 98.1 F (36.7 C)  TempSrc: Axillary Oral Oral Oral  Resp: 18 18 14 16   Height:      Weight:      SpO2: 96% 99% 94% 96%    Intake/Output Summary (Last 24 hours) at 07/13/12 1713 Last data filed at 07/13/12 1500  Gross per 24 hour  Intake 4447.26 ml  Output   2100 ml  Net 2347.26 ml   Filed Weights   06/24/12 0837 07/05/12 0705 07/06/12 0500  Weight: 64.411 kg (142 lb) 64.819 kg (142 lb 14.4 oz) 66.8 kg (147 lb 4.3 oz)    Exam: General: NAD Cardiovascular: S1-S2, RRR. No JVD, murmurs or pedal edema. Telemetry: Sinus tachycardia in the 110s  Respiratory: Reduced breath sounds in the bases but otherwise clear to auscultation/poor inspiratory effort.  No increased work of breathing. Abdomen: Nondistended and soft. Nontender. Normal bowel sounds heard. Musculoskeletal: no edema Neurologic: Alert and oriented to self and place. No new focal deficits. Chronic left hemiparesis  Data Reviewed: Basic Metabolic Panel:  Recent Labs Lab 07/08/12 0625 07/09/12 0440 07/11/12 0320 07/12/12 0540  NA 133* 133* 130* 130*  K 3.1* 3.4* 4.1 4.4  CL 97 100 97 97  CO2 29 25 24 24   GLUCOSE 146* 149* 149* 158*  BUN 5* 7 11 15   CREATININE 0.54 0.53 0.51 0.47*  CALCIUM 7.6* 7.8* 8.3* 8.6  MG 1.8  --   --  1.9  PHOS 3.2  --   --  3.3   Liver Function Tests:  Recent Labs Lab 07/08/12 0625 07/12/12 0540  AST 19 26  ALT 16 15  ALKPHOS 131* 146*  BILITOT 0.6 0.6  PROT 5.3* 6.9  ALBUMIN 1.9* 2.5*   No results found for this basename: LIPASE, AMYLASE,  in the last 168 hours CBC:  Recent Labs Lab 07/08/12 0625 07/09/12 0440 07/10/12 0535 07/12/12  0540 07/13/12 0805  WBC 7.0 7.4 9.9 13.3* 9.7  NEUTROABS 4.7  --   --  9.6*  --   HGB 8.7* 8.5* 9.0* 9.6* 8.9*  HCT 25.7* 26.4* 27.7* 30.1* 27.6*  MCV 82.6 83.3 83.9 84.6 84.7  PLT 414* 443* 504* 522* 414*   BNP (last 3 results)  Recent Labs  06/24/12 0405 06/24/12 0551  PROBNP 1874.0* 1830.0*   No results found for this or any previous visit (from the past 240 hour(s)).   Studies: Ct Abdomen Pelvis W Contrast  07/13/2012  *RADIOLOGY REPORT*  Clinical Data: Evaluate pancreatitis, assess for abscess or necrosis.  CT ABDOMEN AND PELVIS WITH CONTRAST  Technique:  Multidetector CT imaging of the abdomen and pelvis was performed following the standard protocol during bolus administration of intravenous contrast.  Contrast: OMNIPAQUE IOHEXOL 300 MG/ML  SOLN  Comparison: CT abdomen pelvis - 07/05/2012; 06/27/2012  Findings:  Extensive inflammatory change is again seen about the pancreatic bed compatible with acute pancreatitis.  Minimal increase in size of dominant fluid collection  adjacent to the pancreatic tail, now measuring approximately 10.3 x 6.6 x 9.3 cm as measured in greatest oblique axial and cranial caudal dimensions (axial image 32, series four; coronal image 64, series 604)), previously, 8.9 x 6.6 x 7.7 cm.  Grossly unchanged appearance of complex air containing abscess adjacent to the caudal aspect of the pancreatic head measuring approximately 2.8 x 3.0 cm (image 36, series four). Several small sub foci of extraluminal air is seen adjacent to this dominant fluid collection (image 62 and 66, series 2).  No new discrete/definable fluid collection.  There is incomplete enhancement of the tail of the pancreas concerning for pancreatic necrosis.  Suspected mild dilatation of the pancreatic duct about its mid aspect without associated discrete/definable mass.  There is expected adjacent wall thickening and heterogeneous enhancement within the adjacent loop of the duodenum, not definitely resulting in obstruction.  There is nonocclusive thrombus seen within the confluence of the splenic and SMV (axial image 31; coronal image 60, series 604). The splenic vein appears diminutive though patent.  Similarly, the splenic artery appears patent though diminutive.  No definite evidence of active contrast extravasation.  Normal hepatic contour. No discrete hepatic lesions.  No change to slight worsening in mild intrahepatic biliary ductal dilatation. Normal appearance of the gallbladder.  No gallbladder wall thickening or pericholecystic fluid.  No ascites.  There is symmetric enhancement and excretion of the bilateral kidneys.  No definite renal stones.  No discrete lesions.  No evidence of urinary obstruction or perinephric stranding.  Normal appearance of the bilateral adrenal glands and spleen.  Colonic diverticulosis without evidence of diverticulitis.  Scattered atherosclerotic plaque within a normal caliber abdominal aorta.  Prostatic calcifications.  No free fluid in the pelvis.  Limited  visualization of the lower thorax demonstrates resolution of right and residual small/trace left-sided pleural effusion. Improved aeration of the bilateral lobes with persistent dependent ground-glass opacities favored to represent atelectasis.  Mild centrilobular emphysematous change within the imaged lung bases.  Normal heart size.  Coronary calcifications.  No pericardial effusion.  Post intramedullary rod fixation of persistently comminuted proximal left femur fracture, incompletely imaged.  Redemonstrated multilevel mild to moderate compression deformities involving T12, L1, L2 and L5.  Small mesenteric fat containing direct left-sided inguinal hernia.  IMPRESSION:  1.  Findings again compatible with acute pancreatitis with findings concerning for pancreatic necrosis involving primarily the tail of the pancreas. No discrete underlying pancreatic mass. 2.  Increased in size of pancreatic tail fluid collection now measuring approximately 10.3 cm, previously, 8.9 cm.  3.  Grossly unchanged appearance of complex air and fluid collection adjacent to the caudal aspect of the pancreatic head with differential considerations again including an additional area of pancreatic necrosis, a separate abscess/pseudocyst versus superimposed involvement of a preexisting duodenal diverticulum. Of note, several small foci of extraluminal air are seen adjacent to this complex fluid collection. 4.  Small amount of nonocclusive thrombus is seen within the confluence of the splenic and superior mesenteric veins.  The splenic vein and artery appear diminutive peripherally but patent. No definite evidence of contrast extravasation. 5.  Resolution of right right-sided pleural effusion with residual small/trace left-sided pleural effusion.  Improved aeration of the bilateral lung bases with residual opacities favored to represent atelectasis.  6.  Post intramedullary rod fixation of persistently comminuted proximal left femur fracture,  incompletely imaged. 7.  Redemonstrated multilevel mild to moderate compression deformities involving T12, L1, L2 and L5.   Original Report Authenticated By: Tacey Ruiz, MD     Scheduled Meds: . acetaminophen  1,000 mg Oral TID  . cholecalciferol  1,000 Units Oral q morning - 10a  . clopidogrel  75 mg Oral Q breakfast  . enoxaparin (LOVENOX) injection  100 mg Subcutaneous Q24H  . imipenem-cilastatin  500 mg Intravenous Q8H  . insulin aspart  0-9 Units Subcutaneous Q6H  . lip balm  1 application Topical BID  . metoprolol tartrate  50 mg Oral BID  . pantoprazole  40 mg Oral Daily  . potassium chloride  20 mEq Oral Daily  . psyllium  1 packet Oral BID  . saccharomyces boulardii  250 mg Oral BID  . tamsulosin  0.4 mg Oral QHS   Continuous Infusions: . Marland KitchenTPN (CLINIMIX-E) Adult    . Marland KitchenTPN (CLINIMIX-E) Adult 83 mL/hr at 07/12/12 1719   And  . fat emulsion 250 mL (07/12/12 1719)  . lactated ringers 20 mL/hr at 07/12/12 1332    Principal Problem:   Closed left hip fracture Active Problems:   CVA   Dementia   Fracture of left humerus   Leucocytosis   Bacteremia due to Enterococcus   Bacteremia due to Klebsiella pneumoniae   Abnormal finding on GI tract imaging   Pancreatitis, acute   Dilated bile duct   Pancreatic necrosis   Superior mesenteric vein thrombosis   Time spent: 25 min  [facility]  [facility] Pager [phone]. If 7PM-7AM, please contact night-coverage at [email or web address], password [facility] 07/13/2012, 5:13 PM  LOS: 20 days

## 2012-07-13 NOTE — Progress Notes (Signed)
General surgery attending note:  I interviewed and examined the patient's morning. I agree with the assessment and treatment plan outlined by Mr. Marlyne Beards, [place].  He is difficult to interviewed and examined, but his abdomen seems relatively benign. WBC 9700  Assessment: Acute necrotizing pancreatitis with a collection head of pancreas but no drainable abscess. Ultrasound shows gallstones gallstone etiology is quite likely. No indication for surgical intervention at present.   From a surgical standpoint, antibiotics could probably be discontinued. On review of infectious disease notes,they suggested CT abdomen prior to stopping antibiotics, so we'll order CT abdomen today.  Angelia Mould. Derrell Lolling, M.D., [facility] General and Minimally invasive Surgery Breast and Colorectal Surgery Office:   [phone] Pager:   [phone]

## 2012-07-13 NOTE — Progress Notes (Signed)
Discussed in long length of stay rounds. 

## 2012-07-14 DIAGNOSIS — R630 Anorexia: Secondary | ICD-10-CM

## 2012-07-14 LAB — GLUCOSE, CAPILLARY: Glucose-Capillary: 138 mg/dL — ABNORMAL HIGH (ref 70–99)

## 2012-07-14 LAB — PHOSPHORUS: Phosphorus: 3.8 mg/dL (ref 2.3–4.6)

## 2012-07-14 LAB — BASIC METABOLIC PANEL
BUN: 15 mg/dL (ref 6–23)
CO2: 26 mEq/L (ref 19–32)
Calcium: 8.5 mg/dL (ref 8.4–10.5)
GFR calc Af Amer: >90 mL/min (ref >90)
Glucose, Bld: 145 mg/dL — ABNORMAL HIGH (ref 70–99)
Potassium: 3.9 mEq/L (ref 3.5–5.1)

## 2012-07-14 LAB — MAGNESIUM: Magnesium: 1.9 mg/dL (ref 1.5–2.5)

## 2012-07-14 MED ORDER — FAT EMULSION 20 % IV EMUL
240.0000 mL | INTRAVENOUS | Status: DC
  Administered 2012-07-14: 240 mL via INTRAVENOUS
  Filled 2012-07-14: qty 250

## 2012-07-14 MED ORDER — TRACE MINERALS CR-CU-F-FE-I-MN-MO-SE-ZN IV SOLN
INTRAVENOUS | Status: DC
  Administered 2012-07-14: 18:00:00 via INTRAVENOUS
  Filled 2012-07-14: qty 2000

## 2012-07-14 NOTE — Progress Notes (Signed)
Physical Therapy Treatment Patient Details Name: Steven [name] MRN: [id number] DOB: 07/15/1950 Today's Date: 07/14/2012 Time: 4098-1191 PT Time Calculation (min): 24 min  PT Assessment / Plan / Recommendation Comments on Treatment Session  Pt toleraated and corroperated with session. Still need to work on active assisted ROM with LLE and trying to achieve more extgension at least into neutral and this will asssist with tranfers and pain.     Follow Up Recommendations  SNF     Does the patient have the potential to tolerate intense rehabilitation     Barriers to Discharge        Equipment Recommendations  None recommended by PT    Recommendations for Other Services    Frequency Min 3X/week   Plan Discharge plan remains appropriate    Precautions / Restrictions Precautions Precautions: Fall Restrictions Weight Bearing Restrictions: Yes LLE Weight Bearing: Partial weight bearing LLE Partial Weight Bearing Percentage or Pounds: 50%   Pertinent Vitals/Pain Pain in LLE (unable to rate)    Mobility  Bed Mobility Bed Mobility: Supine to Sit Supine to Sit: 1: +2 Total assist Supine to Sit: Patient Percentage: 50% Details for Bed Mobility Assistance: Pt assisted some with upper body BLEwent into synergist flexion patterns especiallythe LLE.  Transfers Transfers: Stand Pivot Transfers Sit to Stand: 1: +2 Total assist Sit to Stand: Patient Percentage: 40% Stand to Sit: 1: +2 Total assist Stand to Sit: Patient Percentage: 40% Stand Pivot Transfers: 1: +2 Total assist Stand Pivot Transfers: Patient Percentage: 20% Details for Transfer Assistance: SPT using RW, pt with intial sit to stand assisting with rise, however difficult with pivot on RLE, really had to increase the assistancwe with the pivot part. Encourage nursng to use lift for sadfety at this point.     Exercises Other Exercises Other Exercises: attempted LLE ankle pumps, but pt got frustrated adn refused to domore than  3. Tried to gradually move LLE into neutral extension with hipand knee however unable to acchieve full neutral extension due to pain (lacking 30 dgrees both hip and knee). Pt would pull LLE back up into flexion at hipand knee witht he inability to push back into extenion. Once in recliner did try to posion over pillow so pateint would rest without pulling into flexion.    PT Diagnosis:    PT Problem List:   PT Treatment Interventions:     PT Goals Acute Rehab PT Goals Time For Goal Achievement: 07/25/12 Potential to Achieve Goals: Fair Pt will go Supine/Side to Sit: with mod assist PT Goal: Supine/Side to Sit - Progress: Progressing toward goal Pt will Sit at Edge of Bed: with supervision;3-5 min;with no upper extremity support PT Goal: Sit at Edge Of Bed - Progress: Progressing toward goal Pt will go Sit to Supine/Side: with mod assist PT Goal: Sit to Supine/Side - Progress: Progressing toward goal Pt will go Sit to Stand: with mod assist PT Goal: Sit to Stand - Progress: Progressing toward goal Pt will go Stand to Sit: with mod assist PT Goal: Stand to Sit - Progress: Progressing toward goal Pt will Transfer Bed to Chair/Chair to Bed: with mod assist PT Transfer Goal: Bed to Chair/Chair to Bed - Progress: Progressing toward goal  Visit Information  Last PT Received On: 07/14/12 Assistance Needed: +2    Subjective Data  Subjective: It hurts, my left leg   Cognition  Cognition Arousal/Alertness: Awake/alert Behavior During Therapy: Flat affect Overall Cognitive Status: Within Functional Limits for tasks assessed (followed commands today,  just stated in pain )    Balance     End of Session PT - End of Session Equipment Utilized During Treatment: Gait belt Activity Tolerance: Patient tolerated treatment well Patient left: in chair;with call bell/phone within reach;with nursing in room Nurse Communication: Mobility status;Need for lift equipment   GP     Marella Bile 07/14/2012, 3:40 PM Marella Bile, PT Pager: [phone] 07/14/2012

## 2012-07-14 NOTE — Progress Notes (Signed)
19 Days Post-Op  Subjective: "Feels like Hell,"  Hurts all over today on exam, not really localized today like it was yesterday.  He can't really tell me if PO's are making him sick, but he does not appear to be taking much PO using I/O.  Objective: Vital signs in last 24 hours: Temp:  [97.7 F (36.5 C)-98.7 F (37.1 C)] 98.4 F (36.9 C) (05/07 0604) Pulse Rate:  [99-130] 105 (05/07 0604) Resp:  [16-18] 16 (05/07 0604) BP: (99-140)/(68-90) 104/72 mmHg (05/07 0604) SpO2:  [95 %-97 %] 97 % (05/07 0604) Last BM Date: 07/12/12  Intake/Output from previous day: 05/06 0701 - 05/07 0700 In: 1779.1 [I.V.:400; IV Piggyback:300; TPN:1079.1] Out: 1625 [Urine:1625] Intake/Output this shift:    General appearance: alert and no distress GI: Complains of pain anywhere you touch him today, points to his upper abdomen before the exam.  No distension, no guarding or rebound on exam.  Lab Results:   Recent Labs  07/12/12 0540 07/13/12 0805  WBC 13.3* 9.7  HGB 9.6* 8.9*  HCT 30.1* 27.6*  PLT 522* 414*    BMET  Recent Labs  07/12/12 0540 07/14/12 0510  NA 130* 128*  K 4.4 3.9  CL 97 94*  CO2 24 26  GLUCOSE 158* 145*  BUN 15 15  CREATININE 0.47* 0.49*  CALCIUM 8.6 8.5   PT/INR No results found for this basename: LABPROT, INR,  in the last 72 hours   Recent Labs Lab 07/08/12 0625 07/12/12 0540  AST 19 26  ALT 16 15  ALKPHOS 131* 146*  BILITOT 0.6 0.6  PROT 5.3* 6.9  ALBUMIN 1.9* 2.5*     Lipase     Component Value Date/Time   LIPASE 38 07/04/2012 0510     Studies/Results: Ct Abdomen Pelvis W Contrast  07/13/2012  *RADIOLOGY REPORT*  Clinical Data: Evaluate pancreatitis, assess for abscess or necrosis.  CT ABDOMEN AND PELVIS WITH CONTRAST  Technique:  Multidetector CT imaging of the abdomen and pelvis was performed following the standard protocol during bolus administration of intravenous contrast.  Contrast: OMNIPAQUE IOHEXOL 300 MG/ML  SOLN  Comparison: CT  abdomen pelvis - 07/05/2012; 06/27/2012  Findings:  Extensive inflammatory change is again seen about the pancreatic bed compatible with acute pancreatitis.  Minimal increase in size of dominant fluid collection adjacent to the pancreatic tail, now measuring approximately 10.3 x 6.6 x 9.3 cm as measured in greatest oblique axial and cranial caudal dimensions (axial image 32, series four; coronal image 64, series 604)), previously, 8.9 x 6.6 x 7.7 cm.  Grossly unchanged appearance of complex air containing abscess adjacent to the caudal aspect of the pancreatic head measuring approximately 2.8 x 3.0 cm (image 36, series four). Several small sub foci of extraluminal air is seen adjacent to this dominant fluid collection (image 62 and 66, series 2).  No new discrete/definable fluid collection.  There is incomplete enhancement of the tail of the pancreas concerning for pancreatic necrosis.  Suspected mild dilatation of the pancreatic duct about its mid aspect without associated discrete/definable mass.  There is expected adjacent wall thickening and heterogeneous enhancement within the adjacent loop of the duodenum, not definitely resulting in obstruction.  There is nonocclusive thrombus seen within the confluence of the splenic and SMV (axial image 31; coronal image 60, series 604). The splenic vein appears diminutive though patent.  Similarly, the splenic artery appears patent though diminutive.  No definite evidence of active contrast extravasation.  Normal hepatic contour. No discrete hepatic  lesions.  No change to slight worsening in mild intrahepatic biliary ductal dilatation. Normal appearance of the gallbladder.  No gallbladder wall thickening or pericholecystic fluid.  No ascites.  There is symmetric enhancement and excretion of the bilateral kidneys.  No definite renal stones.  No discrete lesions.  No evidence of urinary obstruction or perinephric stranding.  Normal appearance of the bilateral adrenal glands  and spleen.  Colonic diverticulosis without evidence of diverticulitis.  Scattered atherosclerotic plaque within a normal caliber abdominal aorta.  Prostatic calcifications.  No free fluid in the pelvis.  Limited visualization of the lower thorax demonstrates resolution of right and residual small/trace left-sided pleural effusion. Improved aeration of the bilateral lobes with persistent dependent ground-glass opacities favored to represent atelectasis.  Mild centrilobular emphysematous change within the imaged lung bases.  Normal heart size.  Coronary calcifications.  No pericardial effusion.  Post intramedullary rod fixation of persistently comminuted proximal left femur fracture, incompletely imaged.  Redemonstrated multilevel mild to moderate compression deformities involving T12, L1, L2 and L5.  Small mesenteric fat containing direct left-sided inguinal hernia.  IMPRESSION:  1.  Findings again compatible with acute pancreatitis with findings concerning for pancreatic necrosis involving primarily the tail of the pancreas. No discrete underlying pancreatic mass. 2.  Increased in size of pancreatic tail fluid collection now measuring approximately 10.3 cm, previously, 8.9 cm.  3.  Grossly unchanged appearance of complex air and fluid collection adjacent to the caudal aspect of the pancreatic head with differential considerations again including an additional area of pancreatic necrosis, a separate abscess/pseudocyst versus superimposed involvement of a preexisting duodenal diverticulum. Of note, several small foci of extraluminal air are seen adjacent to this complex fluid collection. 4.  Small amount of nonocclusive thrombus is seen within the confluence of the splenic and superior mesenteric veins.  The splenic vein and artery appear diminutive peripherally but patent. No definite evidence of contrast extravasation. 5.  Resolution of right right-sided pleural effusion with residual small/trace left-sided pleural  effusion.  Improved aeration of the bilateral lung bases with residual opacities favored to represent atelectasis.  6.  Post intramedullary rod fixation of persistently comminuted proximal left femur fracture, incompletely imaged. 7.  Redemonstrated multilevel mild to moderate compression deformities involving T12, L1, L2 and L5.   Original Report Authenticated By: Tacey Ruiz, MD     Medications: . acetaminophen  1,000 mg Oral TID  . cholecalciferol  1,000 Units Oral q morning - 10a  . clopidogrel  75 mg Oral Q breakfast  . enoxaparin (LOVENOX) injection  100 mg Subcutaneous Q24H  . imipenem-cilastatin  500 mg Intravenous Q8H  . insulin aspart  0-9 Units Subcutaneous Q6H  . lip balm  1 application Topical BID  . metoprolol tartrate  50 mg Oral BID  . pantoprazole  40 mg Oral Daily  . potassium chloride  20 mEq Oral Daily  . psyllium  1 packet Oral BID  . saccharomyces boulardii  250 mg Oral BID  . tamsulosin  0.4 mg Oral QHS    Assessment/Plan Acute necrotizing pancreatitis- US shows gallstones and mild biliary dilatation so gallstone etiology likely.  Repeat CT still show acute pancreatitis, pancreatic necroisis at The head and tail of the pancreas.necrosis or abscess. 07/05/12  Ct scan 07/13/12: shows an increase in the size of the fluid collection in the tail of the pancreas, ongoing pancreatic necrosis in the tail. The areas at the head of the pancreas is not significantly changed. Day 15 of Imipenem-cilastatin yesterday.  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 Positive bacteremia of uncertain etiology. Been followed by ID.  3. 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  4. Significant dementia; currently resides at a skilled nursing facility.  5. Prior history of alcohol and substance abuse.  6. Fall with left hip ORIF 4/17 Dr. Charlann Boxer  7. Urinary incontinence/hypertonicity of the bladder.  8. Prior fracture of the left humerus, with history of multiple  trauma/crush fracture.  9. Lumbar spondylitis 10. Hyponatremia on TNA   Plan:  Day 16 of Primaxin.  Dr. Derrell Lolling reviewed CT yesterday and it is his opinion that we could stop antibiotics.  Continue clear liquids, and TNA for nutrition.  He can go to SNF with follow up after Repeat CT 3-4 weeks; sooner if there was some sign of infection.    LOS: 21 days    Trevel Dillenbeck 07/14/2012

## 2012-07-14 NOTE — Progress Notes (Signed)
CSW spoke with [facility] in [place], confirmed that they will have all the TPN/TNA teachings completed by the end of today and will be able to admit Mr. Fobes in the morning. Patient & his sister, Talbert Forest made aware. CSW will facilitate discharge tomorrow.   Unice Bailey, LCSW [facility] Clinical Social Worker cell #: [phone]

## 2012-07-14 NOTE — Progress Notes (Addendum)
General surgery attending note:  I've interviewed and examined this patient this morning. I agree with the assessment and treatment plan outlined by Mr. Marlyne Beards, [place].  The patient is a difficult historian he says he feels bad. Abdominal exam reveals no tenderness or guarding in the epigastrium her left upper quadrant. Perhaps a little bit of guarding in the right upper quadrant.  CT scan shows what appears to be a developing, acute pseudocyst in the tail of the pancreas. There is still a small focal area of necrosis in the head of the pancreas.  Assessment/Plan: 1) Acute necrotizing pancreatitis, focal area of necrosis in head of pancreas and acute pseudocyst developing in tail of pancreas. 2) Gallstones suggests gallstone etiology likely. 3) Antibiotics can be discontinued. There is no evidence of sepsis, no fever, no leukocytosis, no significant abdominal tenderness. 4) Splenic vein and SMV  thrombosis, Fully anticoagulated on Lovenox. Also on Plavix. 5) Protein calorie malnutrition, on TNA. Very poor surgical risk. 6) You may advance diet as tolerated. 7) Prior CVA-on plavix 8) Significant dementia, currently resides at SNF 9) Prior history of alcohol / substance abuse 10) Recent fall with left hip ORIF April 17 Dr. Charlann Boxer.  Recommend transfer to SNF and followup after repeat CT in 3-4 weeks. Continue protonix indefinately Discontinue antibiotics Value of reglan uncertain Will sign off. Please reconsult if further problems arise.   Angelia Mould. Derrell Lolling, M.D., [facility] General and Minimally invasive Surgery Breast and Colorectal Surgery Office:   [phone] Pager:   [phone]

## 2012-07-14 NOTE — Progress Notes (Signed)
[facility] PROGRESS NOTE  Steven [name] WUJ:[id number] DOB: 1951-01-26 DOA: 06/23/2012 PCP: Lehman Prom, NP  Interval history 62 year old male patient, nursing home resident, PMH of CAD, dementia, CVA admitted on 06/24/12 after an unwitnessed fall. In the ED, x-rays revealed left hip fracture. He does have chronic left humerus fracture. Patient nonambulatory per family. He probably has dementia. Orthopedics was consulted and patient underwent ORIF of left hip fracture on 4/18. Patient had sinus tachycardia early during hospitalization. CTA chest negative for PE. TSH normal. Patient was found to have polymicrobial bacteremia due to enterococcus and Klebsiella. He was treated with broad-spectrum IV antibiotics-aztreonam, vancomycin and Levaquin for presumed healthcare acquired pneumonia. Infectious disease was consulted. CT abdomen showed necrotizing pancreatitis/abscess in the area of head of pancreas-likely source of bacteremia. A Gastrografin study did not show any bowel perforation. His antibiotics were narrowed to imipenem. Despite this, patient continued to have fevers and worsening leukocytosis. Surgeons were consulted. Patient has clinically improved. Surgeons, GI and ID continue to follow patient. Current plan is for n.p.o. except sips of clear liquids, TPN and prolonged antibiotics and we image with CT abdomen in 3-4 weeks and further plans at that time. He was also found to have splenic vein/SMA thrombosis-anticoagulation and is on full dose Lovenox. He eventually we'll need cholecystectomy. Etiology of pancreatitis-chronic alcoholic pancreatitis versus rule out malignancy or an diagnosed ulcer. Currently patient is medically stable and awaiting SNF  Assessment/Plan:                                                                                                             Necrotizing pancreatitis - fever and leukocytosis resolved-now mild leukocytosis of unclear etiology. - surgery  following- no surgical planned at this time. - US abdomen show gallstones which may suggest an etiology. Per surgery, no need for acute cholecystectomy, repeat imaging in 6 weeks ad plan for cholecystectomy at that time if there is no pseudocyst formation. - Continue PICC, TPN and sips of clear liquids  - Antibiotics discontinued today by surgery.   - Repeat CT by surgeons in 3-4 weeks.   -  Will need OP followup with surgery and infectious disease (ID will arrange followup)  Thrombosis in the SMV and splenic vein - due to acute pancreatitis. 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 Plan discussed with GI as well.  Started on full dose Lovenox.  Polymicrobial bacteremia/Leukocytosis - C diff negative. patient is afebrile. Chest x-ray and UA are unremarkable except the chest x-ray showing mild congestion. He was found to have polymicrobial bacteremia with Positive Blood cultures showing enterococcus and klebsiella. ID consulted for further recommendations. CT abd and pelvis was done and showed necrotizing pancreatitis. Started him on primaxin. - ID following, appreciate input - antibiotics discontinued on day 16 by surgery.  - Has PICC line. Continue Primaxin-duration as indicated above. Outpatient followup with infectious disease.  Left femoral intertrochanteric fracture -  - Underwent ORIF of the left femur on 4/18. PT/OT evaluation, plan for SNF placement. Pain  control. Outpatient followup with orthopedics in 2 weeks.  Sinus tachycardia - May be pain, related due to dehydration or most likely due to #1.  CT angiogram of the chest to rule out PE was negative. Free t4 and TSH within normal limits. Echocardiogram showed good LVEF without any wall abn. His tachycardia improved and he is on b blockers. Stable, discontinue telemetry. Likely secondary to bacteremia. Patient had mild tachycardia overnight-unclear etiology-not really  asymptomatic.  History of CVA with left-sided hemiparesis - initially plavix held  For surgery of the left hip. Back on Plavix   Dementia - no acute issues.  Chronic left humerus fracture. no acute issues.   Hypokalemia; replete as needed   Anemia: Likely multifactorial-chronic disease, acute illness and phlebotomies. Follow CBC name. Transfuse if hemoglobin less than 7 g/dL  Hyponatremia: Clinically euvolemic.? SIADH. Monitor.  DVT prophylaxis on lovenox.   Code Status: Full code Family Communication: Discussed with patient's sister, [name] 5/2 Disposition Plan: SNF when medically stable  PROCEDURE:   Open reduction and internal fixation of the left comminuted intertrochanteric femur fracture on 4/18  PICC line  Consultants:  Cardiology for tachycardic  Orthopedics   ID Dr Orvan Falconer.  GI Dr. Arlyce Dice  General surgery  HPI/Subjective: Still has nausea and diffuse abdominal pain which is about the same as yesterday.  States he is unable to drink or even sip anything due to nausea and pain.    Objective: Filed Vitals:   07/13/12 2319 07/14/12 0204 07/14/12 0604 07/14/12 1449  BP:  140/73 104/72 100/68  Pulse: 108 121 105 104  Temp:   98.4 F (36.9 C) 98 F (36.7 C)  TempSrc:   Oral Oral  Resp:   16 18  Height:      Weight:      SpO2:   97% 100%    Intake/Output Summary (Last 24 hours) at 07/14/12 1739 Last data filed at 07/14/12 1529  Gross per 24 hour  Intake   1716 ml  Output    925 ml  Net    791 ml   Filed Weights   06/24/12 0837 07/05/12 0705 07/06/12 0500  Weight: 64.411 kg (142 lb) 64.819 kg (142 lb 14.4 oz) 66.8 kg (147 lb 4.3 oz)    Exam: General: NAD Cardiovascular: S1-S2, RRR. No JVD, murmurs or pedal edema. Telemetry: Sinus tachycardia in the 110s but trending down.   Respiratory: Reduced breath sounds in the bases but otherwise clear to auscultation/poor inspiratory effort. No increased work of breathing. Abdomen:  Nondistended and soft. TTP in the epigastric area without rebound or guarding. Normal bowel sounds heard. Musculoskeletal: no edema Neurologic: Alert and oriented to self and place. No new focal deficits. Chronic left hemiparesis  Data Reviewed: Basic Metabolic Panel:  Recent Labs Lab 07/08/12 0625 07/09/12 0440 07/11/12 0320 07/12/12 0540 07/14/12 0510  NA 133* 133* 130* 130* 128*  K 3.1* 3.4* 4.1 4.4 3.9  CL 97 100 97 97 94*  CO2 29 25 24 24 26   GLUCOSE 146* 149* 149* 158* 145*  BUN 5* 7 11 15 15   CREATININE 0.54 0.53 0.51 0.47* 0.49*  CALCIUM 7.6* 7.8* 8.3* 8.6 8.5  MG 1.8  --   --  1.9 1.9  PHOS 3.2  --   --  3.3 3.8   Liver Function Tests:  Recent Labs Lab 07/08/12 0625 07/12/12 0540  AST 19 26  ALT 16 15  ALKPHOS 131* 146*  BILITOT 0.6 0.6  PROT 5.3* 6.9  ALBUMIN 1.9* 2.5*   No results found for this basename: LIPASE, AMYLASE,  in the last 168 hours CBC:  Recent Labs Lab 07/08/12 0625 07/09/12 0440 07/10/12 0535 07/12/12 0540 07/13/12 0805  WBC 7.0 7.4 9.9 13.3* 9.7  NEUTROABS 4.7  --   --  9.6*  --   HGB 8.7* 8.5* 9.0* 9.6* 8.9*  HCT 25.7* 26.4* 27.7* 30.1* 27.6*  MCV 82.6 83.3 83.9 84.6 84.7  PLT 414* 443* 504* 522* 414*   BNP (last 3 results)  Recent Labs  06/24/12 0405 06/24/12 0551  PROBNP 1874.0* 1830.0*   No results found for this or any previous visit (from the past 240 hour(s)).   Studies: Ct Abdomen Pelvis W Contrast  07/13/2012  *RADIOLOGY REPORT*  Clinical Data: Evaluate pancreatitis, assess for abscess or necrosis.  CT ABDOMEN AND PELVIS WITH CONTRAST  Technique:  Multidetector CT imaging of the abdomen and pelvis was performed following the standard protocol during bolus administration of intravenous contrast.  Contrast: OMNIPAQUE IOHEXOL 300 MG/ML  SOLN  Comparison: CT abdomen pelvis - 07/05/2012; 06/27/2012  Findings:  Extensive inflammatory change is again seen about the pancreatic bed compatible with acute pancreatitis.   Minimal increase in size of dominant fluid collection adjacent to the pancreatic tail, now measuring approximately 10.3 x 6.6 x 9.3 cm as measured in greatest oblique axial and cranial caudal dimensions (axial image 32, series four; coronal image 64, series 604)), previously, 8.9 x 6.6 x 7.7 cm.  Grossly unchanged appearance of complex air containing abscess adjacent to the caudal aspect of the pancreatic head measuring approximately 2.8 x 3.0 cm (image 36, series four). Several small sub foci of extraluminal air is seen adjacent to this dominant fluid collection (image 62 and 66, series 2).  No new discrete/definable fluid collection.  There is incomplete enhancement of the tail of the pancreas concerning for pancreatic necrosis.  Suspected mild dilatation of the pancreatic duct about its mid aspect without associated discrete/definable mass.  There is expected adjacent wall thickening and heterogeneous enhancement within the adjacent loop of the duodenum, not definitely resulting in obstruction.  There is nonocclusive thrombus seen within the confluence of the splenic and SMV (axial image 31; coronal image 60, series 604). The splenic vein appears diminutive though patent.  Similarly, the splenic artery appears patent though diminutive.  No definite evidence of active contrast extravasation.  Normal hepatic contour. No discrete hepatic lesions.  No change to slight worsening in mild intrahepatic biliary ductal dilatation. Normal appearance of the gallbladder.  No gallbladder wall thickening or pericholecystic fluid.  No ascites.  There is symmetric enhancement and excretion of the bilateral kidneys.  No definite renal stones.  No discrete lesions.  No evidence of urinary obstruction or perinephric stranding.  Normal appearance of the bilateral adrenal glands and spleen.  Colonic diverticulosis without evidence of diverticulitis.  Scattered atherosclerotic plaque within a normal caliber abdominal aorta.  Prostatic  calcifications.  No free fluid in the pelvis.  Limited visualization of the lower thorax demonstrates resolution of right and residual small/trace left-sided pleural effusion. Improved aeration of the bilateral lobes with persistent dependent ground-glass opacities favored to represent atelectasis.  Mild centrilobular emphysematous change within the imaged lung bases.  Normal heart size.  Coronary calcifications.  No pericardial effusion.  Post intramedullary rod fixation of persistently comminuted proximal left femur fracture, incompletely imaged.  Redemonstrated multilevel mild to moderate compression deformities involving T12, L1, L2 and L5.  Small mesenteric fat containing direct left-sided inguinal  hernia.  IMPRESSION:  1.  Findings again compatible with acute pancreatitis with findings concerning for pancreatic necrosis involving primarily the tail of the pancreas. No discrete underlying pancreatic mass. 2.  Increased in size of pancreatic tail fluid collection now measuring approximately 10.3 cm, previously, 8.9 cm.  3.  Grossly unchanged appearance of complex air and fluid collection adjacent to the caudal aspect of the pancreatic head with differential considerations again including an additional area of pancreatic necrosis, a separate abscess/pseudocyst versus superimposed involvement of a preexisting duodenal diverticulum. Of note, several small foci of extraluminal air are seen adjacent to this complex fluid collection. 4.  Small amount of nonocclusive thrombus is seen within the confluence of the splenic and superior mesenteric veins.  The splenic vein and artery appear diminutive peripherally but patent. No definite evidence of contrast extravasation. 5.  Resolution of right right-sided pleural effusion with residual small/trace left-sided pleural effusion.  Improved aeration of the bilateral lung bases with residual opacities favored to represent atelectasis.  6.  Post intramedullary rod fixation of  persistently comminuted proximal left femur fracture, incompletely imaged. 7.  Redemonstrated multilevel mild to moderate compression deformities involving T12, L1, L2 and L5.   Original Report Authenticated By: Tacey Ruiz, MD     Scheduled Meds: . acetaminophen  1,000 mg Oral TID  . cholecalciferol  1,000 Units Oral q morning - 10a  . clopidogrel  75 mg Oral Q breakfast  . enoxaparin (LOVENOX) injection  100 mg Subcutaneous Q24H  . imipenem-cilastatin  500 mg Intravenous Q8H  . insulin aspart  0-9 Units Subcutaneous Q6H  . lip balm  1 application Topical BID  . metoprolol tartrate  50 mg Oral BID  . pantoprazole  40 mg Oral Daily  . potassium chloride  20 mEq Oral Daily  . psyllium  1 packet Oral BID  . saccharomyces boulardii  250 mg Oral BID  . tamsulosin  0.4 mg Oral QHS   Continuous Infusions: . Marland KitchenTPN (CLINIMIX-E) Adult 83 mL/hr at 07/13/12 1727  . Marland KitchenTPN (CLINIMIX-E) Adult     And  . fat emulsion    . lactated ringers 20 mL/hr at 07/12/12 1332    Principal Problem:   Closed left hip fracture Active Problems:   CVA   Dementia   Fracture of left humerus   Leucocytosis   Bacteremia due to Enterococcus   Bacteremia due to Klebsiella pneumoniae   Abnormal finding on GI tract imaging   Pancreatitis, acute   Dilated bile duct   Pancreatic necrosis   Superior mesenteric vein thrombosis   Time spent: 25 min  Steven [name], [facility]  [facility] Pager [phone]. If 7PM-7AM, please contact night-coverage at [email or web address], password [facility] 07/14/2012, 5:39 PM  LOS: 21 days

## 2012-07-14 NOTE — Progress Notes (Signed)
Patient is refusing to take all PO medications. He turns his head and repeatedly says "no" and "go away." RN explained the importance of the various medicines but he still refused. MDs are aware of this. Patient is currently having his HR controlled on IV metoprolol since his HR creeps up into the 120s and sustains there.

## 2012-07-14 NOTE — Progress Notes (Signed)
NUTRITION FOLLOW UP  Intervention:   TPN per PharmD: Goal: Clinimix E 5/20 @ 90 ml/hr + IVF 20% at 10 ml/hr on MWF will provide: 108 g protein, average of 2107 Kcal per day. Current rate is 83 ml/hr which provides 1959 kcal and 100 grams of protein daily, meeting 97% of estimated energy needs and 93% of estimated protein needs.   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 Developed bacteremia some days later and found on CT to have acute necrotizing pancreatitis. US shows gallstones and mild biliary dilatation so gallstone etiology likely. 4/28: His repeat blood cultures have become negative on therapy for polymicrobial bacteremia related to pancreatitis. Would repeat imaging again in 6 weeks, and if no pseudocyst formation, would need cholecystectomy at that time. 4/29: Plan for PICC and TPN. "Patient will probably not be able to tolerate anything more than clears for weeks. Once to goal, can start to cycle to assist with his placement."  4/30: RD re-estimated nutritional needs related to necrotizing pancreatitis. PICC line was placed yesterday afternoon.  5/7:  Pt refusing to drink anything from lunch tray (clear liquids) at time of visit but, pt unable to give further explanation as to why. Per RN pt has had a few sips of liquids today. RD encouraged pt to drink more. TPN @83  ml/hr with goal rate of 90 ml/hr   Height: Ht Readings from Last 1 Encounters:  06/24/12 5' 1.81" (1.57 m)    Weight Status:   Wt Readings from Last 1  Encounters:  07/06/12 147 lb 4.3 oz (66.8 kg)    Re-estimated needs:  Kcal: 2020-2340  Protein: 107-120 grams  fluid: 2.2 L   Skin: non-pitting LLE edema; stage 1 pressure ulcer on sacrum; ecchymosis on groin/hip/leg; incision on left hip  Diet Order: Clear Liquid   Intake/Output Summary (Last 24 hours) at 07/14/12 1430 Last data filed at 07/14/12 0700  Gross per 24 hour  Intake 1775.33 ml  Output    925 ml  Net 850.33 ml    Last BM: 5/6   Labs:   Recent Labs Lab 07/08/12 0625  07/11/12 0320 07/12/12 0540 07/14/12 0510  NA 133*  < > 130* 130* 128*  K 3.1*  < > 4.1 4.4 3.9  CL 97  < > 97 97 94*  CO2 29  < > 24 24 26   BUN 5*  < > 11 15 15   CREATININE 0.54  < > 0.51 0.47* 0.49*  CALCIUM 7.6*  < > 8.3* 8.6 8.5  MG 1.8  --   --  1.9 1.9  PHOS 3.2  --   --  3.3 3.8  GLUCOSE 146*  < > 149* 158* 145*  < > = values in this interval not displayed.  CBG (last 3)   Recent Labs  07/13/12 2309 07/14/12 0513 07/14/12 1304  GLUCAP 152* 140* 138*    Scheduled Meds: . acetaminophen  1,000 mg Oral TID  . cholecalciferol  1,000 Units Oral  q morning - 10a  . clopidogrel  75 mg Oral Q breakfast  . enoxaparin (LOVENOX) injection  100 mg Subcutaneous Q24H  . imipenem-cilastatin  500 mg Intravenous Q8H  . insulin aspart  0-9 Units Subcutaneous Q6H  . lip balm  1 application Topical BID  . metoprolol tartrate  50 mg Oral BID  . pantoprazole  40 mg Oral Daily  . potassium chloride  20 mEq Oral Daily  . psyllium  1 packet Oral BID  . saccharomyces boulardii  250 mg Oral BID  . tamsulosin  0.4 mg Oral QHS    Continuous Infusions: . Marland KitchenTPN (CLINIMIX-E) Adult 83 mL/hr at 07/13/12 1727  . Marland KitchenTPN (CLINIMIX-E) Adult     And  . fat emulsion    . lactated ringers 20 mL/hr at 07/12/12 1332    Ian Malkin RD, LDN Inpatient Clinical Dietitian Pager: [phone] After Hours Pager: [phone]

## 2012-07-14 NOTE — Progress Notes (Signed)
ANTICOAGULATION/ANTIBIOTIC CONSULT NOTE - Follow Up Consult  Pharmacy Consult for Lovenox Indication: SMV and splenic vein thrombosis  Pharmacy Consult for primaxin Indication: necrotizing pancreatitis   Allergies  Allergen Reactions  . Cefazolin Rash  . 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 Per ID rec from 5/2 note: Continue imipenem for 10-14 days at least and reevaluate clinically and with CT prior to considering stopping antibiotics or per surgery recommendations    4/17 >> Vancomycin >> 4/20 4/17 >> Levofloxacin >> 4/20 4/17 >> Aztreonam >> 4/20 4/20 >> Metronidazole po >> 4/20  4/20 >> Primaxin >>   Tmax: afeb WBCs: 9.7 Hgb = 8.9 (5/6) - stable, platelets = 414 Renal: Scr wnl, CrCl: CG 81, N 98  Micro: 4/17 Blood >> 2/2 Enterococcus (sens: Amp, Vanc. Resistant: Gent) and 1/2 Klebsiella (sens: unasyn, cefazolin, cefepime, cefoxitin, ceftazidime, ceftriaxone, cipro, gent, imipenem, zosyn, tob, bactrim.) 4/17 mrsa pcr >> negative 4/20 blood x2 >> NGF 4/22 Cdiff >> negative 4/22 Ucx >> NGF   Goal of Therapy:  Anti-Xa level 0.6-1.2 units/ml 4hrs after LMWH dose given Monitor platelets by anticoagulation protocol: Yes   Plan:   Day #17 primaxin, repeat CT reveals no changes in pancreatic necrosis but Increased in size of pancreatic tail fluid collection   Per surgery  note - OK to stop imipenem  Continue Lovenox to 100 mg sq q24h for outpatient convenience (vs q12h)  CBC at least q72h while in hospital  Juliette Alcide, PharmD, BCPS.   Pager: [phone] 07/14/2012 8:55 AM

## 2012-07-14 NOTE — Progress Notes (Signed)
Discussed in long length of stay rounds 07/13/2012. 

## 2012-07-14 NOTE — Progress Notes (Addendum)
PARENTERAL NUTRITION CONSULT NOTE - Follow Up  Pharmacy Consult for TNA Indication: severe nectrotizing pancreatitis  Allergies  Allergen Reactions  . Cefazolin Rash  . Penicillins     REACTION: unknown allergy - hx since childhood    Patient Measurements: Height: 5' 1.81" (157 cm) Weight: 147 lb 4.3 oz (66.8 kg) IBW/kg (Calculated) : 54.17 Adjusted Body Weight: 58 kg  Vital Signs: Temp: 98.4 F (36.9 C) (05/07 0604) Temp src: Oral (05/07 0604) BP: 104/72 mmHg (05/07 0604) Pulse Rate: 105 (05/07 0604) Intake/Output from previous day: 05/06 0701 - 05/07 0700 In: 2783.1 [I.V.:640; IV Piggyback:400; TPN:1743.1] Out: 1625 [Urine:1625] Intake/Output from this shift:    Labs:  Recent Labs  07/12/12 0540 07/13/12 0805  WBC 13.3* 9.7  HGB 9.6* 8.9*  HCT 30.1* 27.6*  PLT 522* 414*     Recent Labs  07/12/12 0540 07/14/12 0510  NA 130* 128*  K 4.4 3.9  CL 97 94*  CO2 24 26  GLUCOSE 158* 145*  BUN 15 15  CREATININE 0.47* 0.49*  CALCIUM 8.6 8.5  MG 1.9 1.9  PHOS 3.3 3.8  PROT 6.9  --   ALBUMIN 2.5*  --   AST 26  --   ALT 15  --   ALKPHOS 146*  --   BILITOT 0.6  --   PREALBUMIN 14.7*  --   TRIG 88  --   CHOL 86  --    Estimated Creatinine Clearance: 81.2 ml/min (by C-G formula based on Cr of 0.49).    Recent Labs  07/13/12 1728 07/13/12 2309 07/14/12 0513  GLUCAP 155* 152* 140*   Assessment: 61 yom with cecrotizing pancreatitis, [place] abd with gallstones. Surgery on board, no need for acute cholecystectomy. Plan repeat imaging in 6 weeks and plan for cholecystectomy at that time if there is no pseudocyst formation. Surgery would like to start TNA since patient will not be able to tolerate anything more than clears for weeks. PICC placed 4/29, TNA started 4/30.  5/7: D#8 TNA - MD plans TNA for 3-4 weeks upon discharge before starting PO. Plan SNF Today. Refuses oral intake, including meds. Prealbumin improving. Repeat CT reveals no changes in pancreatic  necrosis but increased fluid in pancreatic tail  Nutritional Goals:  - RD recs 4/30: 2020 - 2340 Kcal, 107-120 grams protein, 2.2 L fluid - Clinimix E 5/20 @ 90 ml/hr + IVF 20% at 10 ml/hr on MWF will provide: 108 g protein, average of 2107 Kcal per day.  Current nutrition:  - Diet: Clear liquid starting 4/26 - TNA: Clinimix E 5/20 @ 83 ml/hr - mIVF: LR @ KVO  CBGs & Insulin requirements past 24 hours:  - CBGs: 144-188, 8 unit of Novolog SSI/24h -15 units regular insulin/L (=30 units/bag) -Dextrose infusion rate = 4.7 mg/kg/min  Labs:  Electrolytes: Na low at 128 (likely d/t TPN but unable to adjust), otherwise WNL.  Renal Function: Scr wnl/stable, good UOP Hepatic Function: Alk phos elevated  @ 146 (prior to TNA initiation)  Pre-Albumin: 6.8 (5/1), 14.7 (5/5) Triglycerides: 171 (5/1), 88 (5/5) CBGs: appear to be improving with insulin adjustment in TPN  Plan:  At 1800 tonight  Continue TNA Clinimix E 5/20 @ rate 83 ml/hr   TNA to contain IV fat emulsion 20% at 10 ml/hr on MWF only due to ongoing shortage  MVI and trace element to TNA MWF.    Continue regular insulin to 15 units/L (=30 units/bag)  TNA labs Monday/Thursdays  Plan SNF at discharge (likely  tom) - appears staff at SNF is being educated on TPNs. Spoke with social work and will plan to continue CONTINUOUS TPN d/t SNF staff being unfamiliar with TPNs.  Do not see a problem with this if plan is TPN X 3-4 wks (if needed longer then cycling will be preferred). Copy of current TPN formula given to [place] from social services.   Juliette Alcide, PharmD, BCPS.   Pager: [phone] 07/14/2012 8:48 AM

## 2012-07-15 DIAGNOSIS — F1010 Alcohol abuse, uncomplicated: Secondary | ICD-10-CM

## 2012-07-15 LAB — COMPREHENSIVE METABOLIC PANEL
ALT: 33 U/L (ref 0–53)
AST: 37 U/L (ref 0–37)
Alkaline Phosphatase: 154 U/L — ABNORMAL HIGH (ref 39–117)
CO2: 26 mEq/L (ref 19–32)
Chloride: 95 mEq/L — ABNORMAL LOW (ref 96–112)
GFR calc non Af Amer: >90 mL/min (ref >90)
Sodium: 129 mEq/L — ABNORMAL LOW (ref 135–145)
Total Bilirubin: 0.4 mg/dL (ref 0.3–1.2)

## 2012-07-15 LAB — GLUCOSE, CAPILLARY
Glucose-Capillary: 146 mg/dL — ABNORMAL HIGH (ref 70–99)
Glucose-Capillary: 150 mg/dL — ABNORMAL HIGH (ref 70–99)
Glucose-Capillary: 193 mg/dL — ABNORMAL HIGH (ref 70–99)

## 2012-07-15 MED ORDER — CAMPHOR-MENTHOL 0.5-0.5 % EX LOTN: TOPICAL_LOTION | CUTANEOUS | Status: DC | PRN

## 2012-07-15 MED ORDER — OXYCODONE HCL 5 MG PO TABS: 5.0000 mg | ORAL_TABLET | ORAL | Status: DC | PRN

## 2012-07-15 MED ORDER — ENOXAPARIN SODIUM 100 MG/ML ~~LOC~~ SOLN: 100.0000 mg | SUBCUTANEOUS | Status: DC

## 2012-07-15 MED ORDER — ACETAMINOPHEN 500 MG PO TABS: 1000.0000 mg | ORAL_TABLET | Freq: Three times a day (TID) | ORAL | Status: AC

## 2012-07-15 MED ORDER — SACCHAROMYCES BOULARDII 250 MG PO CAPS: 250.0000 mg | ORAL_CAPSULE | Freq: Two times a day (BID) | ORAL | Status: DC

## 2012-07-15 MED ORDER — ONDANSETRON HCL 4 MG PO TABS: 4.0000 mg | ORAL_TABLET | Freq: Four times a day (QID) | ORAL | Status: DC | PRN

## 2012-07-15 MED ORDER — METOPROLOL TARTRATE 100 MG PO TABS: 100.0000 mg | ORAL_TABLET | Freq: Two times a day (BID) | ORAL | Status: DC

## 2012-07-15 MED ORDER — POTASSIUM CHLORIDE CRYS ER 20 MEQ PO TBCR: 20.0000 meq | EXTENDED_RELEASE_TABLET | Freq: Every day | ORAL | Status: DC

## 2012-07-15 MED ORDER — METOPROLOL TARTRATE 100 MG PO TABS
100.0000 mg | ORAL_TABLET | Freq: Two times a day (BID) | ORAL | Status: DC
  Filled 2012-07-15: qty 1

## 2012-07-15 MED ORDER — PSYLLIUM 95 % PO PACK: 1.0000 | PACK | Freq: Two times a day (BID) | ORAL | Status: DC

## 2012-07-15 MED ORDER — HEPARIN SOD (PORK) LOCK FLUSH 100 UNIT/ML IV SOLN
250.0000 [IU] | INTRAVENOUS | Status: AC | PRN
  Administered 2012-07-15: 500 [IU]

## 2012-07-15 NOTE — Progress Notes (Signed)
Clinical Social Work  CSW faxed DC summary to [facility]	[place] who is agreeable to admission today. SNF confirmed they are ready with TNA supplies. CSW prepared DC packet with hard scripts and signed FL2. CSW informed patient and RN of DC and left message with patient's sister. CSW coordinated transportation via [facility] for 5:00pm pick up. CSW is signing off.  Unk Lightning, LCSW (Coverage for [employer])

## 2012-07-15 NOTE — Progress Notes (Signed)
He has had a repeat of his CT scan.  No significant changes.  Surgery does not feel any active infection noted after revieiwing the CT.  I agree to stop antibiotics now.  Repeat CT scan in 3-4 weeks per surgery recommendations.

## 2012-07-15 NOTE — Progress Notes (Addendum)
PARENTERAL NUTRITION CONSULT NOTE - Follow Up  Pharmacy Consult for TNA Indication: severe nectrotizing pancreatitis  Allergies  Allergen Reactions  . Cefazolin Rash  . Penicillins     REACTION: unknown allergy - hx since childhood    Patient Measurements: Height: 5' 1.81" (157 cm) Weight: 147 lb 4.3 oz (66.8 kg) IBW/kg (Calculated) : 54.17 Adjusted Body Weight: 58 kg  Vital Signs: Temp: 98.2 F (36.8 C) (05/08 0602) Temp src: Oral (05/08 0602) BP: 116/83 mmHg (05/08 0602) Pulse Rate: 109 (05/08 0602) Intake/Output from previous day: 05/07 0701 - 05/08 0700 In: 3022.7 [P.O.:60; I.V.:470; IV Piggyback:300; TPN:2192.7] Out: -  Intake/Output from this shift:    Labs:  Recent Labs  07/13/12 0805  WBC 9.7  HGB 8.9*  HCT 27.6*  PLT 414*     Recent Labs  07/14/12 0510 07/15/12 0405  NA 128* 129*  K 3.9 3.6  CL 94* 95*  CO2 26 26  GLUCOSE 145* 152*  BUN 15 16  CREATININE 0.49* 0.52  CALCIUM 8.5 8.5  MG 1.9 2.0  PHOS 3.8 4.0  PROT  --  6.5  ALBUMIN  --  2.4*  AST  --  37  ALT  --  33  ALKPHOS  --  154*  BILITOT  --  0.4   Estimated Creatinine Clearance: 81.2 ml/min (by C-G formula based on Cr of 0.52).    Recent Labs  07/14/12 1304 07/14/12 1721 07/15/12 0007  GLUCAP 138* 157* 150*   Assessment: 61 yom with cecrotizing pancreatitis, [place] abd with gallstones. Surgery on board, no need for acute cholecystectomy. Plan repeat imaging in 6 weeks and plan for cholecystectomy at that time if there is no pseudocyst formation. Surgery would like to start TNA since patient will not be able to tolerate anything more than clears for weeks. PICC placed 4/29, TNA started 4/30.  5/8: D#9 TNA - MD plans TNA for 3-4 weeks upon discharge before starting PO. Plan SNF Today. Refuses oral intake, including meds. Prealbumin improving. Repeat CT reveals no changes in pancreatic necrosis but increased fluid in pancreatic tail  Nutritional Goals:  - RD recs 4/30: 2020 -  2340 Kcal, 107-120 grams protein, 2.2 L fluid - Clinimix E 5/20 @ 90 ml/hr + IVF 20% at 10 ml/hr on MWF will provide: 108 g protein, average of 2107 Kcal per day.  Current nutrition:  - Diet: Clear liquid starting 4/26 - TNA: Clinimix E 5/20 @ 83 ml/hr - mIVF: LR @ KVO  CBGs & Insulin requirements past 24 hours:  - CBGs at goal 150mg /dl, 6 unit of Novolog WNU/27O -15 units regular insulin/L (=30 units/bag) -Dextrose infusion rate = 4.7 mg/kg/min  Labs:  Electrolytes: Na low at 129 (likely d/t TPN but unable to adjust), otherwise WNL.  Renal Function: Scr wnl/stable Hepatic Function: Alk phos elevated/stable  @ 152 (prior to TNA initiation)  Pre-Albumin: 6.8 (5/1), 14.7 (5/5) Triglycerides: 171 (5/1), 88 (5/5)  CBGs: appear to be improving with insulin adjustment in TPN  Plan:  At 1800 tonight  Continue TNA Clinimix E 5/20 @ rate 83 ml/hr   TNA to contain IV fat emulsion 20% at 10 ml/hr on MWF only due to ongoing shortage  MVI and trace element to TNA MWF.    Continue regular insulin to 15 units/L (=30 units/bag)  TNA labs Monday/Thursdays  Plan SNF at discharge (likely today?)-  staff at SNF is being educated on TPNs. Spoke with social work and will plan to continue CONTINUOUS TPN d/t  SNF staff being unfamiliar with TPNs.  Do not see a problem with this if plan is TPN X 3-4 wks (if needed longer then cycling will be preferred). Copy of current TPN formula given to [place] from social services on 5/6  Juliette Alcide, PharmD, BCPS.   Pager: [phone] 07/15/2012 7:52 AM

## 2012-07-15 NOTE — Progress Notes (Signed)
Clinical Social Work  CSW spoke with [facility]	[place] regarding DC plans. [facility] requested that hospital supply TPN for 3 days. CSW spoke with director who reported it was SNF responsibility to provide supplies and [facility] would not provide. CSW called SNF and left a message with this information. CSW will continue to follow to assist with DC plans.  Unk Lightning, LCSW (Coverage for [employer])

## 2012-07-15 NOTE — Discharge Summary (Addendum)
Physician Discharge Summary  Steven [name] YQM:[id number] DOB: July 04, 1950 DOA: 06/23/2012  PCP: Lehman Prom, NP  Admit date: 06/23/2012 Discharge date: 07/15/2012  Recommendations for Outpatient Follow-up:  1. Transfer to SNF for ongoing PT/OT and care 2. Continue TPN until discontinued at time to be determined by General Surgery.  Please draw at least twice weekly labs to monitor TPN, to be reviewed by pharmacist/overseeing MD.   3. CT scan in the morning of 5/19:  Please fax results to [facility], attn Dr. Luisa Hart, fax [phone], and the Infectious Disease Clinic, attention Judyann Munson, fax [phone]. 4. Follow up with surgery on May 19th, Infectious disease clinic on May 20th, appointments already scheduled 5. Follow up with orthopedics in 2 weeks, appointment needs to be scheduled by patient or family.     Discharge Diagnoses:  Principal Problem:   Closed left hip fracture Active Problems:   CVA   Dementia   Fracture of left humerus   Leucocytosis   Bacteremia due to Enterococcus   Bacteremia due to Klebsiella pneumoniae   Abnormal finding on GI tract imaging   Pancreatitis, acute   Dilated bile duct   Pancreatic necrosis   Superior mesenteric vein thrombosis   Discharge Condition: stable, improved  Diet recommendation: clear liquid diet  Wt Readings from Last 3 Encounters:  07/06/12 66.8 kg (147 lb 4.3 oz)  07/06/12 66.8 kg (147 lb 4.3 oz)  01/02/10 62.596 kg (138 lb)    History of present illness:   Steven [name] is a 62 y.o. male was brought from the nursing home after patient had a fall which was unwitnessed. Patient as per the history has not had any loss of consciousness. In the ER x-rays revealed left hip fracture. Patient does have chronic left humerus fracture. In the ER patient was found to be tachycardic at this time pain the medication and fluids were given. As per patient's sister with whom I spoke patient also had a fall last week  and was taken to the orthopedic doctor and at that time no acute fractures were found except for the chronic left humerus fracture. Patient is usually is not ambulatory as per patient's sister. At this time patient does not complain of any chest pain or Kenidy Crossland of breath. Patient probably has dementia and contributes very less to history. Orthopedic surgeon on call was consulted by the ER physician.   Hospital Course:   Overview: Steven [name] was admitted on 4/17 after an unwitnessed fall.  He does have chronic left humerus fracture. Patient nonambulatory per family.  Orthopedics was consulted and patient underwent ORIF of left hip fracture on 4/18.  He developed sinus tachycardia early during hospitalization. CTA chest negative for PE. TSH normal, but he was found to have polymicrobial bacteremia due to enterococcus and Klebsiella.  Infectious disease was consulted and he was initially treated empirically with broad-spectrum IV antibiotics-aztreonam, vancomycin and Levaquin for presumed healthcare acquired pneumonia, however, his likely source was intraabdominal.  CT abdomen demonstrated necrotizing pancreatitis/abscess in the area of head of pancreas.  A Gastrografin study did not show any bowel perforation. His antibiotics were therefore narrowed to imipenem.  Despite this, patient continued to have fevers and worsening leukocytosis. Surgeons were consulted.  He was started on TPN and clear liquids.  He should remain n.p.o. except sips of clear liquids and continue TPN.  He underwent repeat CT scan prior to discharge which demonstrated an increase in the size of the fluid collection in the tail of the  pancreas and ongoing necrosis in the tail.  His antibiotics were discontinued on 5/8.  He was also found to have splenic vein/SMA thrombosis-anticoagulation and is on full dose Lovenox. He eventually will need cholecystectomy. 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 Currently patient is medically stable and awaiting SNF  Necrotizing pancreatitis with persistent pancreatitis in head and tail of pancreas and a complex fluid and air filled collection in the head of the pancreas.   - US abdomen show gallstones which may suggest an etiology. Per surgery, no need for acute cholecystectomy.   - Continue PICC, TPN and clear liquids  - Antibiotics were discontinued on 5/8 in consultation with surgery and infectious disease - Repeat CT by surgeons in 3-4 weeks - IF he develops fevers or signs of worsening illness, he should return immediately to the hospital.    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  - Continue full dose Lovenox until able to tolerate oral anticoagulants.    Polymicrobial bacteremia/Leukocytosis - C diff negative. patient is afebrile. Chest x-ray and UA are unremarkable except the chest x-ray showing mild congestion. He was found to have polymicrobial bacteremia with Positive Blood cultures showing enterococcus and klebsiella.  - antibiotics discontinued on 5/8 after completing more than 2 weeks of appropriate therapy  Left femoral intertrochanteric fracture -  - Underwent ORIF of the left femur on 4/18. PT/OT evaluation, plan for SNF placement. Pain control. Outpatient followup with orthopedics in 2 weeks.   Sinus tachycardia - May be pain, related due to dehydration or most likely due to #1. CT angiogram of the chest to rule out PE was negative. Free t4 and TSH within normal limits. Echocardiogram showed good LVEF without any wall abn. His tachycardia improved and he is on beta blockers.  Likely secondary to bacteremia and deconditioning and recommend continuing beta blocker.    History of CVA with left-sided hemiparesis - initially plavix held, but now restarted.   Dementia - no acute issues.   Chronic left humerus fracture. no acute issues.  Hypokalemia; replete as needed  Anemia: Likely multifactorial-chronic disease, acute illness and phlebotomies.  Hyponatremia: Clinically euvolemic and may be due to TPN and some SIADH due to pancreatitis.    PROCEDURE:  Open reduction and internal fixation of the left comminuted intertrochanteric femur fracture on 4/18  PICC line Consultants:  Cardiology for tachycardic  Orthopedics  ID Dr Orvan Falconer.  GI Dr. Arlyce Dice  General surgery  Antibiotics:   -  Total days of antibiotics 23 -  Total days of imipenem 18, last day on 5/8  Discharge Exam: Filed Vitals:   07/15/12 1126  BP: 110/60  Pulse: 60  Temp:   Resp:    Filed Vitals:   07/14/12 2217 07/15/12 0602 07/15/12 0730 07/15/12 1126  BP: 103/85 116/83 100/80 110/60  Pulse: 97 109  60  Temp: 97.8 F (36.6 C) 98.2 F (36.8 C) 97.8 F (36.6 C)   TempSrc: Oral Oral Oral   Resp: 18 22 24    Height:      Weight:      SpO2: 93% 100% 95%    General: NAD, thin CF, kyphotic Cardiovascular: S1-S2, Mild tachycardia with regular rhythm.  No JVD, murmurs or pedal edema. Telemetry: Sinus tachycardia in the 110s but trending down.  Respiratory: Reduced breath sounds in the  bases but otherwise clear to auscultation/poor inspiratory effort. No increased work of breathing.  Abdomen: Nondistended and soft. TTP in the epigastric area without rebound or guarding. Hypoactive bowel sounds.  Musculoskeletal: no edema  Neurologic: Alert and oriented to self and place. No new focal deficits. Chronic left hemiparesis  Discharge Instructions      Discharge Orders   Future Appointments Provider Department Dept Phone   07/26/2012 2:10 PM Maisie Fus A. Cornett, MD [facility], [place] [phone]   07/27/2012 11:15 AM Judyann Munson, MD [facility] for Infectious Disease [phone]   Future Orders Complete By Expires     CT Abd Limited W/Cm  07/26/2012 10/15/2013    Scheduling  Instructions:      Please fax results to [facility], attn Dr. Luisa Hart, fax [phone], and the Infectious Disease Clinic, attention Judyann Munson, fax [phone].    Questions:      Reason for Exam (SYMPTOM  OR DIAGNOSIS REQUIRED):  pancreatic protocol.  pancreatitis    Preferred imaging location?:  [facility]    Call MD for:  difficulty breathing, headache or visual disturbances  As directed     Call MD for:  extreme fatigue  As directed     Call MD for:  hives  As directed     Call MD for:  persistant dizziness or light-headedness  As directed     Call MD for:  persistant nausea and vomiting  As directed     Call MD for:  severe uncontrolled pain  As directed     Call MD for:  temperature >100.4  As directed     Diet general  As directed     Scheduling Instructions:      Clear liquid diet    Discharge instructions  As directed     Comments:      You were hospitalized with a hip fracture which was repaired.  You developed pancreatitis and blood stream infection which were severe.  You have completed more than 3 weeks of antibiotics and were seen by several subspecialists.  Please continue IV nutrition for the next several weeks.  You will be seen in follow up by [facility] and Orthopedic Surgery.    Increase activity slowly  As directed     Partial weight bearing  As directed     Scheduling Instructions:      50%        Medication List    STOP taking these medications       multivitamin with minerals Tabs      TAKE these medications       acetaminophen 500 MG tablet  Commonly known as:  TYLENOL  Take 2 tablets (1,000 mg total) by mouth 3 (three) times daily.     camphor-menthol lotion  Commonly known as:  SARNA  Apply topically as needed for itching.     cholecalciferol 1000 UNITS tablet  Commonly known as:  VITAMIN D  Take 1,000 Units by mouth every morning.     clopidogrel 75 MG tablet  Commonly known as:  PLAVIX  Take 75 mg  by mouth every morning.     enoxaparin 100 MG/ML injection  Commonly known as:  LOVENOX  Inject 1 mL (100 mg total) into the skin daily.     metoprolol 100 MG tablet  Commonly known as:  LOPRESSOR  Take 1 tablet (100 mg total) by mouth 2 (two) times daily.     omeprazole 20 MG  capsule  Commonly known as:  PRILOSEC  Take 40 mg by mouth every morning.     ondansetron 4 MG tablet  Commonly known as:  ZOFRAN  Take 1 tablet (4 mg total) by mouth every 6 (six) hours as needed for nausea.     oxyCODONE 5 MG immediate release tablet  Commonly known as:  Oxy IR/ROXICODONE  Take 1-2 tablets (5-10 mg total) by mouth every 4 (four) hours as needed.     potassium chloride SA 20 MEQ tablet  Commonly known as:  K-DUR,KLOR-CON  Take 1 tablet (20 mEq total) by mouth daily.     psyllium 95 % Pack  Commonly known as:  HYDROCIL/METAMUCIL  Take 1 packet by mouth 2 (two) times daily.     saccharomyces boulardii 250 MG capsule  Commonly known as:  FLORASTOR  Take 1 capsule (250 mg total) by mouth 2 (two) times daily.     tamsulosin 0.4 MG Caps  Commonly known as:  FLOMAX  Take 0.4 mg by mouth at bedtime.       Follow-up Information   Follow up with Shelda Pal, MD. Schedule an appointment as soon as possible for a visit in 4 weeks.   Contact information:   [address] [place] [address] [phone]       Follow up with Dortha Schwalbe., MD On 07/26/2012. (Appt at 2:00pm)    Contact information:   [address] [place] [address] [phone]       The results of significant diagnostics from this hospitalization (including imaging, microbiology, ancillary and laboratory) are listed below for reference.    Significant Diagnostic Studies: Dg Chest 1 View  06/24/2012  *RADIOLOGY REPORT*  Clinical Data: Status post fall; concern for chest injury.  CHEST - 1 VIEW  Comparison: Chest radiograph performed 06/02/2011   Findings: The lungs are mildly hypoexpanded.  Mild vascular congestion is seen.  The lungs remain grossly clear.  There is no evidence of focal opacification, pleural effusion or pneumothorax.  The cardiomediastinal silhouette is within normal limits.  No acute osseous abnormalities are seen.  IMPRESSION: Mild vascular congestion seen; lungs hypoexpanded but grossly clear.  No displaced rib fractures identified.   Original Report Authenticated By: Tonia Ghent, M.D.    Dg Hip Complete Left  06/24/2012  *RADIOLOGY REPORT*  Clinical Data: Status post fall; left hip pain.  LEFT HIP - COMPLETE 2+ VIEW  Comparison: None.  Findings: There is a comminuted left femoral intertrochanteric fracture, though given comminution, this appears to involve an underlying basicervical left femoral neck fracture.  A displaced lesser trochanteric fragment is seen.  There is significant medial angulation; the left femoral head remains seated at the acetabulum.  No additional fractures are seen.  The right hip joint is grossly unremarkable in appearance.  No significant degenerative change is appreciated.  The sacroiliac joints are unremarkable in appearance.  The visualized bowel gas pattern is grossly unremarkable in appearance.  Scattered vascular calcifications are seen.  IMPRESSION:  1.  Comminuted left femoral intertrochanteric fracture; given comminution, this appears to involve an underlying basicervical left femoral neck fracture.  Displaced lesser trochanteric fragment seen, with significant medial angulation.  No evidence of dislocation. 2.  Scattered vascular calcifications seen.   Original Report Authenticated By: Tonia Ghent, M.D.    Dg Hip Operative Left  06/25/2012  *RADIOLOGY REPORT*  Clinical Data: Left hip intramedullary rod.  OPERATIVE LEFT HIP  Comparison: Radiographs  06/23/2012.  Findings: Three spot fluoroscopic images demonstrate the placement of a left hip dynamic screw and intramedullary rod.  This is  secured by a distal interlocking screw.  The comminuted intertrochanteric femur fracture demonstrates near anatomic reduction.  No complications are identified.  IMPRESSION: Near anatomic reduction of the intertrochanteric femur fracture status post fixation.   Original Report Authenticated By: Carey Bullocks, M.D.    Ct Head Wo Contrast  06/24/2012  *RADIOLOGY REPORT*  Clinical Data: Status post fall out of bed.  Concern for head injury.  CT HEAD WITHOUT CONTRAST  Technique:  Contiguous axial images were obtained from the base of the skull through the vertex without contrast.  Comparison: CT of the head performed 07/16/2010  Findings: There is no evidence of acute infarction, mass lesion, or intra- or extra-axial hemorrhage on CT.  Prominence of the ventricles and sulci reflects moderate cortical volume loss.  Diffuse periventricular and subcortical white matter change reflects small vessel ischemic microangiopathy.  Chronic lacunar infarcts are seen within the basal ganglia bilaterally, and within the right cerebellar hemisphere.  Mild cerebellar atrophy is noted.  The brainstem and fourth ventricle are within normal limits.  The cerebral hemispheres demonstrate grossly normal gray-white differentiation.  No mass effect or midline shift is seen.  There is no evidence of fracture; visualized osseous structures are unremarkable in appearance.  The orbits are within normal limits. The paranasal sinuses and mastoid air cells are well-aerated.  No significant soft tissue abnormalities are seen.  IMPRESSION:  1.  No evidence of traumatic intracranial injury or fracture. 2.  Moderate cortical volume loss and diffuse small vessel ischemic microangiopathy. 3.  Chronic lacunar infarcts within the basal ganglia bilaterally, and in the right cerebellar hemisphere.   Original Report Authenticated By: Tonia Ghent, M.D.    Ct Angio Chest Pe W/cm &/or Wo Cm  06/24/2012  *RADIOLOGY REPORT*  Clinical Data: Larey Seat out of bed.   Tachycardia.  Leukocytosis. Concern for pulmonary embolus.  CT ANGIOGRAPHY CHEST  Technique:  Multidetector CT imaging of the chest using the standard protocol during bolus administration of intravenous contrast. Multiplanar reconstructed images including MIPs were obtained and reviewed to evaluate the vascular anatomy.  Contrast: OMNIPAQUE IOHEXOL 350 MG/ML SOLN  Comparison: Chest radiograph performed 06/23/2012  Findings: There is no evidence of significant pulmonary embolus.  Patchy airspace opacities within both lower lung lobes most likely reflect atelectasis, though mild pneumonia could have a similar appearance.  No pleural effusion or pneumothorax is seen.  No masses are identified; no abnormal focal contrast enhancement is seen.  Diffuse coronary artery calcifications are seen.  The mediastinum is otherwise unremarkable in appearance.  No mediastinal lymphadenopathy is seen.  No pericardial effusion is identified. The great vessels are grossly unremarkable in appearance.  The esophagus is largely filled with air and fluid, raising question for esophageal dysmotility; an apparent small hiatal hernia is noted.  No axillary lymphadenopathy is seen.  The thyroid gland is unremarkable in appearance.  The visualized portions of the liver are unremarkable.  No acute osseous abnormalities are seen.  IMPRESSION:  1.  No evidence of significant pulmonary embolus. 2.  Patchy airspace opacities within both lower lung lobes most likely reflect atelectasis, though mild pneumonia could have a similar appearance. 3.  Diffuse coronary artery calcifications seen. 4.  Esophagus largely filled with air and fluid, raising question for esophageal dysmotility.  Apparent small hiatal hernia seen.   Original Report Authenticated By: Tonia Ghent, M.D.    US  Abdomen Complete  07/03/2012  *RADIOLOGY REPORT*  Clinical Data:  62 year old male with abdominal pain.  History of pancreatitis and hepatitis.  ABDOMINAL ULTRASOUND  COMPLETE  Comparison:  None.  Findings:  Gallbladder:   Multiple mobile gallstones are noted, the largest measuring 1.8 cm.  There is no evidence of gallbladder wall thickening, pericholecystic fluid or sonographic Murphy's sign.  Common Bile Duct: Mild intrahepatic and extrahepatic biliary prominence is noted.  The CBD measures 13 mm in greatest diameter.  Liver:  The liver is within normal limits in parenchymal echogenicity. No focal abnormalities are identified.  IVC:  The IVC is not well visualized.  Pancreas: The pancreas is not well visualized secondary to overlying bowel gas.  Spleen:  Within normal limits in size and echotexture.  Right kidney:  The right kidney is normal in size and parenchymal echogenicity.  There is no evidence of solid mass, hydronephrosis or definite renal calculi.  The right kidney measures 12.6 cm.  Left kidney:  The left kidney is normal in size and parenchymal echogenicity.  There is no evidence of solid mass, hydronephrosis or definite renal calculi.   The left kidney measures 10.0 cm.  Abdominal Aorta:   The abdominal aorta is not well visualized secondary to overlying bowel gas.  A small amount of perihepatic ascites is noted as well as a right pleural effusion.  IMPRESSION: Cholelithiasis without evidence of acute cholecystitis.  Mild intrahepatic and extrahepatic biliary dilatation without obstructing cause identified.  Small amount of ascites and right pleural effusion.  IVC, pancreas and abdominal aorta not well visualized.   Original Report Authenticated By: Harmon Pier, M.D.    Ct Abdomen Pelvis W Contrast  07/13/2012  *RADIOLOGY REPORT*  Clinical Data: Evaluate pancreatitis, assess for abscess or necrosis.  CT ABDOMEN AND PELVIS WITH CONTRAST  Technique:  Multidetector CT imaging of the abdomen and pelvis was performed following the standard protocol during bolus administration of intravenous contrast.  Contrast: OMNIPAQUE IOHEXOL 300 MG/ML  SOLN  Comparison: CT  abdomen pelvis - 07/05/2012; 06/27/2012  Findings:  Extensive inflammatory change is again seen about the pancreatic bed compatible with acute pancreatitis.  Minimal increase in size of dominant fluid collection adjacent to the pancreatic tail, now measuring approximately 10.3 x 6.6 x 9.3 cm as measured in greatest oblique axial and cranial caudal dimensions (axial image 32, series four; coronal image 64, series 604)), previously, 8.9 x 6.6 x 7.7 cm.  Grossly unchanged appearance of complex air containing abscess adjacent to the caudal aspect of the pancreatic head measuring approximately 2.8 x 3.0 cm (image 36, series four). Several small sub foci of extraluminal air is seen adjacent to this dominant fluid collection (image 62 and 66, series 2).  No new discrete/definable fluid collection.  There is incomplete enhancement of the tail of the pancreas concerning for pancreatic necrosis.  Suspected mild dilatation of the pancreatic duct about its mid aspect without associated discrete/definable mass.  There is expected adjacent wall thickening and heterogeneous enhancement within the adjacent loop of the duodenum, not definitely resulting in obstruction.  There is nonocclusive thrombus seen within the confluence of the splenic and SMV (axial image 31; coronal image 60, series 604). The splenic vein appears diminutive though patent.  Similarly, the splenic artery appears patent though diminutive.  No definite evidence of active contrast extravasation.  Normal hepatic contour. No discrete hepatic lesions.  No change to slight worsening in mild intrahepatic biliary ductal dilatation. Normal appearance of the gallbladder.  No gallbladder  wall thickening or pericholecystic fluid.  No ascites.  There is symmetric enhancement and excretion of the bilateral kidneys.  No definite renal stones.  No discrete lesions.  No evidence of urinary obstruction or perinephric stranding.  Normal appearance of the bilateral adrenal glands  and spleen.  Colonic diverticulosis without evidence of diverticulitis.  Scattered atherosclerotic plaque within a normal caliber abdominal aorta.  Prostatic calcifications.  No free fluid in the pelvis.  Limited visualization of the lower thorax demonstrates resolution of right and residual small/trace left-sided pleural effusion. Improved aeration of the bilateral lobes with persistent dependent ground-glass opacities favored to represent atelectasis.  Mild centrilobular emphysematous change within the imaged lung bases.  Normal heart size.  Coronary calcifications.  No pericardial effusion.  Post intramedullary rod fixation of persistently comminuted proximal left femur fracture, incompletely imaged.  Redemonstrated multilevel mild to moderate compression deformities involving T12, L1, L2 and L5.  Small mesenteric fat containing direct left-sided inguinal hernia.  IMPRESSION:  1.  Findings again compatible with acute pancreatitis with findings concerning for pancreatic necrosis involving primarily the tail of the pancreas. No discrete underlying pancreatic mass. 2.  Increased in size of pancreatic tail fluid collection now measuring approximately 10.3 cm, previously, 8.9 cm.  3.  Grossly unchanged appearance of complex air and fluid collection adjacent to the caudal aspect of the pancreatic head with differential considerations again including an additional area of pancreatic necrosis, a separate abscess/pseudocyst versus superimposed involvement of a preexisting duodenal diverticulum. Of note, several small foci of extraluminal air are seen adjacent to this complex fluid collection. 4.  Small amount of nonocclusive thrombus is seen within the confluence of the splenic and superior mesenteric veins.  The splenic vein and artery appear diminutive peripherally but patent. No definite evidence of contrast extravasation. 5.  Resolution of right right-sided pleural effusion with residual small/trace left-sided pleural  effusion.  Improved aeration of the bilateral lung bases with residual opacities favored to represent atelectasis.  6.  Post intramedullary rod fixation of persistently comminuted proximal left femur fracture, incompletely imaged. 7.  Redemonstrated multilevel mild to moderate compression deformities involving T12, L1, L2 and L5.   Original Report Authenticated By: Tacey Ruiz, MD    Ct Abdomen Pelvis W Contrast  06/27/2012  *RADIOLOGY REPORT*  Clinical Data: Evaluate for GI source of infection. Elevated white blood cell count.  Demented.  Gastroesophageal reflux disease.  No pain.  CT ABDOMEN AND PELVIS WITH CONTRAST  Technique:  Multidetector CT imaging of the abdomen and pelvis was performed following the standard protocol during bolus administration of intravenous contrast.  Contrast: OMNIPAQUE IOHEXOL 300 MG/ML  SOLN, 50mL OMNIPAQUE IOHEXOL 300 MG/ML  SOLN  Comparison: Chest CT 06/24/2012.  No prior abdominal imaging.  Findings: Lung bases:  Motion degradation throughout.  Right greater than left bibasilar airspace disease.  Mild cardiomegaly with coronary artery atherosclerosis.  Interval development of small bilateral pleural effusions.  Abdomen/pelvis:  Motion degradation continuing into the abdomen. Normal liver, spleen.  Tiny hiatal hernia.  Underdistended proximal stomach.  Mildly edematous transverse duodenum.  There is pancreatic and peripancreatic edema which is moderate in severity.  An area of ill defined gas in the region of the inferior pancreatic head and pancreatic uncinate process measures 3.2 cm on image 40/series 2.  There is adjacent filling defect consistent with nonocclusive thrombus as well as  air within the superior mesenteric vein.  Example images 33 - 40/series 2 and image 36/series 5.  Continues to the level  of the splenoportal confluence, but no portal venous air is seen.  Normal gallbladder.  Minimal intrahepatic biliary ductal dilatation.  The common duct measures 1.0 cm  on image 35/series 2. No definite obstructive stone is seen.  There is hyperattenuation in the region of the ampulla on image 38/series 2.  No peripancreatic fluid collections seen.  Normal adrenal glands and kidneys.  Age advanced aortic atherosclerosis without aneurysm.  Normal caliber of large and small bowel loops.  Small volume abdominal ascites.  Fat containing left inguinal hernia. No pelvic adenopathy.  Tiny amount of air within the nondependent urinary bladder. Normal prostate, without significant free pelvic fluid.  Trace fluid in the pelvis bilaterally.  Bones/Musculoskeletal:  Comminuted proximal left femoral fracture with intramedullary rod in place.  IMPRESSION:  1.  Moderate pancreatic and peripancreatic edema, most consistent with pancreatitis.  Ill defined gas "collection" in the region of the pancreatic head/uncinate process.  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  Differential considerations include extraluminal air from the adjacent duodenum.  Although a duodenal diverticulum could have this appearance, the presence of concurrent superior mesenteric vein thrombus and air argues for an infectious etiology. Biliary ductal dilatation.  Cannot exclude distal choledocholithiasis. If this is a concern, consider MRCP. This study was made a "call report". 2.  Small volume abdominal pelvic ascites. 3.  Development of small bilateral pleural effusions with adjacent collapse / consolidative change. 4.  Air within urinary bladder, likely iatrogenic.  Correlate with prior instrumentation.   Original Report Authenticated By: Jeronimo Greaves, M.D.    Dg Pelvis Portable  06/25/2012  *RADIOLOGY REPORT*  Clinical Data: Status post left nail  PORTABLE PELVIS  Comparison: 06/23/12  Findings: There has been interval open reduction and internal fixation of the comminuted intertrochanteric fracture of the left proximal femur.  Intramedullary rod and screw device is in place. Hardware components and  fracture fragments are in anatomic alignment.  IMPRESSION:  1.  Status post ORIF of left proximal femur fracture.   Original Report Authenticated By: Signa Kell, M.D.    Ct Abd Wo & W Cm  07/05/2012  *RADIOLOGY REPORT*  Clinical Data: Follow-up of abnormal pancreas.  Query mass versus pancreatitis.  CT ABDOMEN WITHOUT AND WITH CONTRAST  Technique:  Multidetector CT imaging of the abdomen was performed following the standard protocol before and during bolus administration of intravenous contrast. Contrast enhanced images are obtained during the arterial, portal, and delayed phases.  Contrast: OMNIPAQUE IOHEXOL 350 MG/ML SOLN  Comparison: CT abdomen and pelvis 06/27/2012.  Findings: Bilateral pleural effusions with basilar atelectasis. Coronary artery calcifications.  Unenhanced images of the abdomen demonstrate diffuse infiltration around the pancreas consistent with changes of acute pancreatitis. There are small calcifications in the region of the head of the pancreas and vascular calcifications are present.  There is increased density demonstrated in the splenic vein suggesting venous thrombosis.  Arterial and portal phase images demonstrate heterogeneous enhancement of the pancreas with hypo enhancing pancreatic tail consistent with pancreatic necrosis.  There is hypo enhancement of the head of the pancreas with heterogeneous gas collections consistent with pancreatic necrosis or abscess.  Infiltration or edema in the peripancreatic fat with a developing heterogeneous fluid and tissue density in the region of the tail the pancreas and splenic hilum.  No discrete pseudocyst is demonstrated. No discrete pancreatic mass is identified, although masses could be obscured by the inflammatory process.  The celiac axis, superior mesenteric artery, and splenic artery appear patent.  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 Focal thrombus demonstrated in the mesenteric vein.  Portal veins are patent.  There  is edema throughout the mesenteric fat.  The stomach and duodenum are decompressed.  The liver and spleen are unremarkable.  There is minimal intra and extrahepatic bile duct dilatation but no filling defect is identified to suggest a stone. The gallbladder is contracted.  Kidneys are symmetrical with symmetrical nephrograms.  No hydronephrosis.  Calcification of the abdominal aorta.  Inferior vena cava is patent without distension. No free air in the abdomen.  IMPRESSION: Inflammatory changes and around the pancreas consistent with acute pancreatitis.  Pancreatic necrosis demonstrated in the head and tail of the pancreas.  Gas collection in the head of the pancreas may be related pancreatic necrosis or abscess.  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  No discrete mass lesions identified.   Original Report Authenticated By: Burman Nieves, M.D.    Dg Chest Port 1 View  07/06/2012  *RADIOLOGY REPORT*  Clinical Data: Line placement.  PORTABLE CHEST - 1 VIEW  Comparison: 06/24/2012.  Findings: Patient is rotated to the left.  There is a right upper extremity PICC.  The tip is in the mid to lower SVC, just inferior to the carina.  The bilateral basilar opacities present, probably representing atelectasis.  IMPRESSION: New right upper extremity PICC with the tip in the mid to lower SVC.  Basilar opacity most compatible with atelectasis. Technically suboptimal projection due to rotation.   Original Report Authenticated By: Andreas Newport, M.D.    Dg Shoulder Left  06/24/2012  *RADIOLOGY REPORT*  Clinical Data: Status post fall; left shoulder pain.  LEFT SHOULDER - 2+ VIEW  Comparison: None.  Findings: There is a chronic fracture of the proximal left humeral diaphysis, with significant surrounding healing reaction and bony remodelling, and an apparent pseudoarthrosis.  Mild medial angulation is noted.  The left humeral head remains seated at the glenoid fossa.  No additional fractures are  seen.  The visualized portions of the left lung appear clear.  No significant soft tissue abnormalities are characterized on radiograph.  IMPRESSION: Chronic fracture of the proximal left humeral diaphysis, with significant surrounding healing reaction and bony remodelling, and apparent pseudarthrosis.  Mild medial angulation noted.   Original Report Authenticated By: Tonia Ghent, M.D.    Dg Kayleen Memos W/water Sol Cm  06/30/2012  *RADIOLOGY REPORT*  Clinical Data:Pancreatitis.  Peripancreatic abscess.  Duodenal edema.  ESOPHAGUS/BARIUM SWALLOW/TABLET STUDY  Fluoroscopy Time: 4 minutes 14 seconds slow pulsed fluoroscopy  Comparison: CT scan dated 06/27/2012  Findings: Scout image demonstrates slight dilatation of the single small bowel loop in the mid abdomen.  This is suggestive of a localized ileus.  The patient ingested water-soluble contrast.  The contrast passed into the normal appearing pylorus and duodenal bulb.  There is marked edema of the mucosa of the second and third portions of the duodenum.  However, contrast did pass through that area.  There is no visible duodenal ulcer.  No extravasation of contrast.  IMPRESSION:   Marked edema of the mucosa of the second and third portions of the duodenum without evidence of a duodenal ulcer.  The edema is most likely secondary to the adjacent pancreatitis.   Original Report Authenticated By: Francene Boyers, M.D.     Microbiology: No results found for this or any previous visit (from the past 240 hour(s)).   Labs: Basic Metabolic Panel:  Recent Labs Lab 07/09/12 0440 07/11/12 0320 07/12/12 0540  07/14/12 0510 07/15/12 0405  NA 133* 130* 130* 128* 129*  K 3.4* 4.1 4.4 3.9 3.6  CL 100 97 97 94* 95*  CO2 25 24 24 26 26   GLUCOSE 149* 149* 158* 145* 152*  BUN 7 11 15 15 16   CREATININE 0.53 0.51 0.47* 0.49* 0.52  CALCIUM 7.8* 8.3* 8.6 8.5 8.5  MG  --   --  1.9 1.9 2.0  PHOS  --   --  3.3 3.8 4.0   Liver Function Tests:  Recent Labs Lab  07/12/12 0540 07/15/12 0405  AST 26 37  ALT 15 33  ALKPHOS 146* 154*  BILITOT 0.6 0.4  PROT 6.9 6.5  ALBUMIN 2.5* 2.4*   No results found for this basename: LIPASE, AMYLASE,  in the last 168 hours No results found for this basename: AMMONIA,  in the last 168 hours CBC:  Recent Labs Lab 07/09/12 0440 07/10/12 0535 07/12/12 0540 07/13/12 0805  WBC 7.4 9.9 13.3* 9.7  NEUTROABS  --   --  9.6*  --   HGB 8.5* 9.0* 9.6* 8.9*  HCT 26.4* 27.7* 30.1* 27.6*  MCV 83.3 83.9 84.6 84.7  PLT 443* 504* 522* 414*   Cardiac Enzymes: No results found for this basename: CKTOTAL, CKMB, CKMBINDEX, TROPONINI,  in the last 168 hours BNP: BNP (last 3 results)  Recent Labs  06/24/12 0405 06/24/12 0551  PROBNP 1874.0* 1830.0*   CBG:  Recent Labs Lab 07/14/12 1304 07/14/12 1721 07/15/12 0007 07/15/12 0600 07/15/12 0759  GLUCAP 138* 157* 150* 152* 146*    Time coordinating discharge: 45 minutes  Signed:  Kameryn Tisdel  [facility] 07/15/2012, 1:22 PM

## 2012-07-22 LAB — TSH: Thyroid Stimulating Horm: 2.46 u[IU]/mL

## 2012-07-26 ENCOUNTER — Ambulatory Visit (INDEPENDENT_AMBULATORY_CARE_PROVIDER_SITE_OTHER): Payer: Medicaid Other | Admitting: Surgery

## 2012-07-26 ENCOUNTER — Ambulatory Visit (HOSPITAL_COMMUNITY)
Admission: RE | Admit: 2012-07-26 | Discharge: 2012-07-26 | Disposition: A | Discharge status: Home / Self Care | Payer: Medicaid Other | Source: Ambulatory Visit | Attending: Internal Medicine | Admitting: Internal Medicine

## 2012-07-26 ENCOUNTER — Other Ambulatory Visit (HOSPITAL_COMMUNITY): Payer: Self-pay | Admitting: Internal Medicine

## 2012-07-26 ENCOUNTER — Encounter (HOSPITAL_COMMUNITY): Payer: Self-pay

## 2012-07-26 DIAGNOSIS — S42309A Unspecified fracture of shaft of humerus, unspecified arm, initial encounter for closed fracture: Secondary | ICD-10-CM

## 2012-07-26 DIAGNOSIS — S42302D Unspecified fracture of shaft of humerus, left arm, subsequent encounter for fracture with routine healing: Secondary | ICD-10-CM

## 2012-07-26 DIAGNOSIS — I6350 Cerebral infarction due to unspecified occlusion or stenosis of unspecified cerebral artery: Secondary | ICD-10-CM

## 2012-07-26 DIAGNOSIS — R933 Abnormal findings on diagnostic imaging of other parts of digestive tract: Secondary | ICD-10-CM

## 2012-07-26 DIAGNOSIS — D72829 Elevated white blood cell count, unspecified: Secondary | ICD-10-CM

## 2012-07-26 DIAGNOSIS — I959 Hypotension, unspecified: Secondary | ICD-10-CM

## 2012-07-26 DIAGNOSIS — K859 Acute pancreatitis without necrosis or infection, unspecified: Secondary | ICD-10-CM | POA: Insufficient documentation

## 2012-07-26 DIAGNOSIS — S72142A Displaced intertrochanteric fracture of left femur, initial encounter for closed fracture: Secondary | ICD-10-CM

## 2012-07-26 DIAGNOSIS — M47817 Spondylosis without myelopathy or radiculopathy, lumbosacral region: Secondary | ICD-10-CM

## 2012-07-26 DIAGNOSIS — R000 Tachycardia, unspecified: Secondary | ICD-10-CM

## 2012-07-26 DIAGNOSIS — B952 Enterococcus as the cause of diseases classified elsewhere: Secondary | ICD-10-CM

## 2012-07-26 DIAGNOSIS — R911 Solitary pulmonary nodule: Secondary | ICD-10-CM | POA: Insufficient documentation

## 2012-07-26 DIAGNOSIS — K5730 Diverticulosis of large intestine without perforation or abscess without bleeding: Secondary | ICD-10-CM | POA: Insufficient documentation

## 2012-07-26 DIAGNOSIS — S72002D Fracture of unspecified part of neck of left femur, subsequent encounter for closed fracture with routine healing: Secondary | ICD-10-CM

## 2012-07-26 DIAGNOSIS — K8591 Acute pancreatitis with uninfected necrosis, unspecified: Secondary | ICD-10-CM

## 2012-07-26 DIAGNOSIS — K8020 Calculus of gallbladder without cholecystitis without obstruction: Secondary | ICD-10-CM

## 2012-07-26 DIAGNOSIS — K838 Other specified diseases of biliary tract: Secondary | ICD-10-CM

## 2012-07-26 DIAGNOSIS — F1010 Alcohol abuse, uncomplicated: Secondary | ICD-10-CM

## 2012-07-26 DIAGNOSIS — K219 Gastro-esophageal reflux disease without esophagitis: Secondary | ICD-10-CM

## 2012-07-26 DIAGNOSIS — I252 Old myocardial infarction: Secondary | ICD-10-CM

## 2012-07-26 DIAGNOSIS — E860 Dehydration: Secondary | ICD-10-CM

## 2012-07-26 DIAGNOSIS — R32 Unspecified urinary incontinence: Secondary | ICD-10-CM

## 2012-07-26 DIAGNOSIS — G894 Chronic pain syndrome: Secondary | ICD-10-CM

## 2012-07-26 DIAGNOSIS — R630 Anorexia: Secondary | ICD-10-CM

## 2012-07-26 DIAGNOSIS — R209 Unspecified disturbances of skin sensation: Secondary | ICD-10-CM

## 2012-07-26 DIAGNOSIS — F411 Generalized anxiety disorder: Secondary | ICD-10-CM

## 2012-07-26 DIAGNOSIS — I2510 Atherosclerotic heart disease of native coronary artery without angina pectoris: Secondary | ICD-10-CM | POA: Insufficient documentation

## 2012-07-26 DIAGNOSIS — F0390 Unspecified dementia without behavioral disturbance: Secondary | ICD-10-CM

## 2012-07-26 DIAGNOSIS — K8689 Other specified diseases of pancreas: Secondary | ICD-10-CM

## 2012-07-26 DIAGNOSIS — R7881 Bacteremia: Secondary | ICD-10-CM

## 2012-07-26 DIAGNOSIS — S72002A Fracture of unspecified part of neck of left femur, initial encounter for closed fracture: Secondary | ICD-10-CM

## 2012-07-26 MED ORDER — IOHEXOL 300 MG/ML  SOLN
80.0000 mL | Freq: Once | INTRAMUSCULAR | Status: AC | PRN
  Administered 2012-07-26: 80 mL via INTRAVENOUS

## 2012-07-26 NOTE — Progress Notes (Signed)
Subjective:     Patient ID: Steven [name], male   DOB: [birth year], 62 y.o.   MRN: [id number]  HPI Patient returns in followup for pancreatitis. He was seen one month ago after being admitted to the hospital secondary to a fall resulting in a hip wrist fracture. He developed abdominal pain and CT scan showed pancreatitis with areas of necrosis and air within these areas. He never developed sepsis and got better with antibiotics and TNA.  He has been ONTNA for one month. He is severely demented and physically debilitated. Denies abdominal pain.  Review of Systems  Unable to perform ROS      Objective:   Physical Exam  Constitutional: He appears cachectic. He is cooperative. He has a sickly appearance.  Neck: Neck supple.  Abdominal: Soft. Bowel sounds are normal. He exhibits mass. He exhibits no distension. There is no tenderness. There is no rebound and no guarding.  Skin: Skin is warm and dry.       Assessment:     History of acute pancreatitis with pancreatic necrosis which is asymptomatic currently    Plan:     Stop TNA for now. Start low-fat diet. Given his multiple medical problems, surgical intervention not indicated since I doubt he would survive laparotomy. He is asymptomatic with no signs of sepsis therefore would treat him conservatively. I reviewed his CT scan from today and he has 2 areas necrosis one in the head of pancreas and a second in the tail locules of air along significance. He has a clinically benign abdominal examination and did not appear to be ill from his pancreas. Followup 3 months or sooner if symptoms develop. If he cannot tolerate low-fat diet may require more TNA.  Keep PICC in for now.  Needs medical follow up but surgeryy unlikely option unless he develops septic shock.  Cholecystectomy may be necessary but not sure pancreatitis from this or ETOH use.  He is recovering from fracture surgery fairly well.

## 2012-07-26 NOTE — Patient Instructions (Signed)
Low fat diet. Stop TNA.  Return 3 months.

## 2012-07-27 ENCOUNTER — Encounter: Payer: Self-pay | Admitting: Internal Medicine

## 2012-07-27 ENCOUNTER — Telehealth (INDEPENDENT_AMBULATORY_CARE_PROVIDER_SITE_OTHER): Payer: Self-pay

## 2012-07-27 ENCOUNTER — Ambulatory Visit (INDEPENDENT_AMBULATORY_CARE_PROVIDER_SITE_OTHER): Payer: Medicaid Other | Admitting: Internal Medicine

## 2012-07-27 VITALS — BP 108/70 | HR 68 | Temp 98.1°F

## 2012-07-27 DIAGNOSIS — L539 Erythematous condition, unspecified: Secondary | ICD-10-CM

## 2012-07-27 NOTE — Telephone Encounter (Signed)
Dr Drue Second from infectious disease called today to review the note from yesterdays visit with Dr Luisa Hart. She was wanting to verify andy notation of TPN. I read note to her. She will advise family Picc line to stay in place for now and if pt does not tolerate diet to call our office for direction.

## 2012-07-27 NOTE — Progress Notes (Signed)
[facility] NOTE  RFV: enterococcal bacteremia  Subjective:    Patient ID: Steven [name], male    DOB: 24-Jul-1950, 62 y.o.   MRN: [id number]  HPI Mr. Zullo hx of dementia, chronic left humerus fracture who was admitted on 4/17 after an unwitnessed fall sustained left hip fracture s/p ORIF on 4/18. He was found to have enterococcal and klebsiella bacteremia . Infectious disease was consulted and he was initially treated empirically with broad-spectrum IV antibiotics-aztreonam, vancomycin and Levaquin for presumed healthcare acquired pneumonia, however, his likely source was intraabdominal. He was switched to imipenem and treated for 2 wks until 5/8, and had documentation of clearance of antibiotics. 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 He eventually was thought that he will need cholecystectomy. He saw dr. Luisa Hart on 5/19 with repeat imaging which showed Improvement with size of pseudocyst. The plan from general surgery is to trying to switch to oral intake of nutrition and wean off TNA.  He has no fever, chills, diarrhea but SNF RN noted some redness at picc insertion site during dressing change yesterday Current Outpatient Prescriptions on File Prior to Visit  Medication Sig Dispense Refill  . acetaminophen (TYLENOL) 500 MG tablet Take 2 tablets (1,000 mg total) by mouth 3 (three) times daily.  30 tablet    . camphor-menthol (SARNA) lotion Apply topically as needed for itching.  222 mL    . cholecalciferol (VITAMIN D) 1000 UNITS tablet Take 1,000 Units by mouth every morning.       . clopidogrel (PLAVIX) 75 MG tablet Take 75 mg by mouth every morning.       . enoxaparin (LOVENOX) 100 MG/ML injection Inject 1 mL (100 mg total) into the skin daily.  0 Syringe    . metoprolol (LOPRESSOR) 100 MG tablet Take 1 tablet  (100 mg total) by mouth 2 (two) times daily.      Marland Kitchen omeprazole (PRILOSEC) 20 MG capsule Take 40 mg by mouth every morning.       . ondansetron (ZOFRAN) 4 MG tablet Take 1 tablet (4 mg total) by mouth every 6 (six) hours as needed for nausea.  20 tablet    . oxyCODONE (OXY IR/ROXICODONE) 5 MG immediate release tablet Take 1-2 tablets (5-10 mg total) by mouth every 4 (four) hours as needed.  30 tablet  0  . potassium chloride SA (K-DUR,KLOR-CON) 20 MEQ tablet Take 1 tablet (20 mEq total) by mouth daily.      . psyllium (HYDROCIL/METAMUCIL) 95 % PACK Take 1 packet by mouth 2 (two) times daily.  56 each    . saccharomyces boulardii (FLORASTOR) 250 MG capsule Take 1 capsule (250 mg total) by mouth 2 (two) times daily.      . tamsulosin (FLOMAX) 0.4 MG CAPS Take 0.4 mg by mouth at bedtime.       No current facility-administered medications on file prior to visit.   Active Ambulatory Problems    Diagnosis Date Noted  . ALCOHOL USE 01/02/2010  . HYPOTENSION 01/10/2010  . TINGLING 01/08/2010  . LOSS OF APPETITE 01/02/2010  . ANXIETY 04/25/2010  . CHRONIC PAIN SYNDROME 04/25/2010  . CVA 03/21/2008  . GERD 04/25/2010  . SPONDYLOSIS, LUMBAR 04/25/2010  . URINARY INCONTINENCE 03/21/2008  . FRACTURE, HUMERUS, LEFT 04/25/2010  . Closed left hip fracture 06/24/2012  .  Dementia 06/24/2012  . Fracture of left humerus 06/24/2012  . Leucocytosis 06/24/2012  . Bacteremia due to Enterococcus 06/26/2012  . Bacteremia due to Klebsiella pneumoniae 06/28/2012  . Abnormal finding on GI tract imaging 06/29/2012  . Pancreatitis, acute 07/02/2012  . Dilated bile duct 07/05/2012  . 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 No Resolved Ambulatory Problems   Past Medical History  Diagnosis Date  . Lumbar spondylitis   . Anxiety   . Urinary incontinence   . GERD (gastroesophageal reflux disease)   . Stroke    . Chronic pain   . Hypertonicity of bladder   . History of alcoholism   . Alcoholic hepatitis 2001  . Substance abuse 2001  . Epistaxis 2001  . Thrombus of left atrial appendage 2003   Social hx: has a sister that is involved in his care. Currently a SNF resident.  History  Substance Use Topics  . Smoking status: Never Smoker   . Smokeless tobacco: Not on file  . Alcohol Use: Yes     Comment: 6pack a week  family history includes Dementia in his father; Diabetes type II in his father and mother; and Hypertension in his father and mother.  Review of Systems Unable to answer due to dementia    Objective:   Physical Exam BP 108/70  Pulse 68  Temp(Src) 98.1 F (36.7 C) (Oral) Physical Exam  Constitutional: He is oriented to person, only. He appears frail. No distress lying on stretcher HENT:  Mouth/Throat: Oropharynx is clear and moist. No oropharyngeal exudate.  Cardiovascular: Normal rate, regular rhythm and normal heart sounds. Exam reveals no gallop and no friction rub.  No murmur heard.  Pulmonary/Chest: Effort normal and breath sounds normal. No respiratory distress. He has no wheezes.  Abdominal: Soft. Bowel sounds are normal. He exhibits no distension. There is no tenderness.  Lymphadenopathy: no cervical adenopathy.  Neurological: He is alert and oriented to person, only. Moves all extremities Skin: right arm picc line,  slight erythema at insertion site Psychiatric: He has a normal mood and affect. His behavior is normal.         Assessment & Plan:  Erythema at picc line site = we Change dressing today and outlined hte current area of erythema. recommend to keep eye on it and may need to replace the picc line if it worsens. IT Does not look infected at this point. Asked facility to look at it tomorrow and on thursdat. If erythema is worsening, then recommend for IR to place new picc line  Dr. Luisa Hart managing needs of tpnf  Polymicrobial bacteremia = cleared no  longer needs further treatment  PRN for follow up

## 2012-08-08 ENCOUNTER — Encounter: Payer: Self-pay | Admitting: Internal Medicine

## 2012-08-12 ENCOUNTER — Telehealth: Payer: Self-pay | Admitting: *Deleted

## 2012-08-12 NOTE — Telephone Encounter (Signed)
Nurse Arman Filter from patient nursing called and asked if they could D/C patient PICC as he is done with his therapy and the facility doctor thinks he is fine to remove it. Asked Dr Drue Second and she gave a verbal OK to pull the patient PICC. Advised the facility of this and to give [place] a call if they need anything.

## 2012-09-07 ENCOUNTER — Encounter: Payer: Self-pay | Admitting: Internal Medicine

## 2012-10-04 ENCOUNTER — Ambulatory Visit (INDEPENDENT_AMBULATORY_CARE_PROVIDER_SITE_OTHER): Payer: Medicaid Other | Admitting: Surgery

## 2012-10-07 LAB — BASIC METABOLIC PANEL
EGFR (Non-African Amer.): >60
Osmolality: 270 (ref 275–301)
Potassium: 3.3 mmol/L — ABNORMAL LOW (ref 3.5–5.1)

## 2012-10-08 ENCOUNTER — Encounter: Payer: Self-pay | Admitting: Internal Medicine

## 2012-11-04 ENCOUNTER — Ambulatory Visit (INDEPENDENT_AMBULATORY_CARE_PROVIDER_SITE_OTHER): Payer: Medicaid Other | Admitting: Surgery

## 2012-11-04 ENCOUNTER — Encounter (INDEPENDENT_AMBULATORY_CARE_PROVIDER_SITE_OTHER): Payer: Self-pay | Admitting: Surgery

## 2012-11-04 VITALS — BP 90/60 | HR 68 | Resp 14 | Wt 175.0 lb

## 2012-11-04 DIAGNOSIS — K863 Pseudocyst of pancreas: Secondary | ICD-10-CM

## 2012-11-04 DIAGNOSIS — K862 Cyst of pancreas: Secondary | ICD-10-CM

## 2012-11-04 NOTE — Progress Notes (Signed)
Subjective:     Patient ID: Steven [name], male   DOB: 1951/02/05, 62 y.o.   MRN: [id number]  HPI Patient returns in followup for pancreatitis. He was seen one month ago after being admitted to the hospital secondary to a fall resulting in a hip wrist fracture. He developed abdominal pain and CT scan showed pancreatitis with areas of necrosis and air within these areas. He never developed sepsis and got better with antibiotics and TNA.  He has been ONTNA for one month. He is severely demented and physically debilitated. Denies abdominal pain.  Review of Systems  Unable to perform ROS      Objective:   Physical Exam  Constitutional: He appears cachectic. He is cooperative. He has a sickly appearance.  Neck: Neck supple.  Abdominal: Soft. Bowel sounds are normal. He exhibits no  mass. He exhibits no distension. There is no tenderness. There is no rebound and no guarding.  Skin: Skin is warm and dry.       Assessment:     History of acute pancreatitis with pancreatic necrosis which is asymptomatic currently    Plan:     No further follow necessary or surgery.  Follow clinically at this point given multiple medical problems but doing ok. Follow up as needed.

## 2012-11-04 NOTE — Patient Instructions (Signed)
Return as needed.  Stable.

## 2012-11-08 ENCOUNTER — Encounter: Payer: Self-pay | Admitting: Internal Medicine

## 2012-11-25 LAB — BASIC METABOLIC PANEL
Calcium, Total: 9.3 mg/dL (ref 8.5–10.1)
EGFR (African American): >60
EGFR (Non-African Amer.): >60
Glucose: 95 mg/dL (ref 65–99)
Sodium: 136 mmol/L (ref 136–145)

## 2012-12-06 ENCOUNTER — Emergency Department: Payer: Self-pay | Admitting: Emergency Medicine

## 2012-12-08 ENCOUNTER — Encounter: Payer: Self-pay | Admitting: Internal Medicine

## 2013-01-08 ENCOUNTER — Encounter: Payer: Self-pay | Admitting: Internal Medicine

## 2013-02-07 ENCOUNTER — Encounter: Payer: Self-pay | Admitting: Internal Medicine

## 2013-03-10 ENCOUNTER — Encounter: Payer: Self-pay | Admitting: Internal Medicine

## 2013-04-10 ENCOUNTER — Encounter: Payer: Self-pay | Admitting: Internal Medicine

## 2013-04-12 ENCOUNTER — Encounter: Payer: Self-pay | Admitting: *Deleted

## 2013-05-08 ENCOUNTER — Encounter: Payer: Self-pay | Admitting: Internal Medicine

## 2013-06-02 ENCOUNTER — Ambulatory Visit: Payer: Self-pay | Admitting: Gerontology

## 2013-06-08 ENCOUNTER — Encounter: Payer: Self-pay | Admitting: Internal Medicine

## 2013-07-08 ENCOUNTER — Encounter: Payer: Self-pay | Admitting: Internal Medicine

## 2013-08-08 ENCOUNTER — Encounter: Payer: Self-pay | Admitting: Internal Medicine

## 2013-08-25 LAB — BASIC METABOLIC PANEL
Anion Gap: 6 — ABNORMAL LOW (ref 7–16)
BUN: 17 mg/dL (ref 7–18)
CALCIUM: 9.1 mg/dL (ref 8.5–10.1)
CHLORIDE: 103 mmol/L (ref 98–107)
CREATININE: 0.87 mg/dL (ref 0.60–1.30)
Co2: 28 mmol/L (ref 21–32)
EGFR (African American): >60
EGFR (Non-African Amer.): >60
GLUCOSE: 95 mg/dL (ref 65–99)
Osmolality: 275 (ref 275–301)
POTASSIUM: 3.8 mmol/L (ref 3.5–5.1)
Sodium: 137 mmol/L (ref 136–145)

## 2013-08-25 LAB — MAGNESIUM: MAGNESIUM: 1.7 mg/dL — AB

## 2013-09-07 ENCOUNTER — Encounter: Payer: Self-pay | Admitting: Internal Medicine

## 2013-09-30 LAB — URINALYSIS, COMPLETE
Bilirubin,UR: NEGATIVE
Blood: NEGATIVE
GLUCOSE, UR: NEGATIVE mg/dL (ref 0–75)
KETONE: NEGATIVE
Nitrite: NEGATIVE
Ph: 8 (ref 4.5–8.0)
Specific Gravity: 1.005 (ref 1.003–1.030)

## 2013-10-03 LAB — URINE CULTURE

## 2013-10-08 ENCOUNTER — Encounter: Payer: Self-pay | Admitting: Internal Medicine

## 2013-11-08 ENCOUNTER — Encounter: Payer: Self-pay | Admitting: Internal Medicine

## 2013-12-08 ENCOUNTER — Encounter: Payer: Self-pay | Admitting: Internal Medicine

## 2014-01-08 ENCOUNTER — Encounter: Payer: Self-pay | Admitting: Internal Medicine

## 2014-02-07 ENCOUNTER — Encounter: Payer: Self-pay | Admitting: Internal Medicine

## 2014-02-28 LAB — BASIC METABOLIC PANEL
Anion Gap: 7 (ref 7–16)
BUN: 26 mg/dL — ABNORMAL HIGH (ref 7–18)
CHLORIDE: 105 mmol/L (ref 98–107)
CO2: 27 mmol/L (ref 21–32)
Calcium, Total: 8.8 mg/dL (ref 8.5–10.1)
Creatinine: 0.96 mg/dL (ref 0.60–1.30)
EGFR (African American): >60
EGFR (Non-African Amer.): >60
GLUCOSE: 101 mg/dL — AB (ref 65–99)
OSMOLALITY: 282 (ref 275–301)
Potassium: 3.8 mmol/L (ref 3.5–5.1)
SODIUM: 139 mmol/L (ref 136–145)

## 2014-02-28 LAB — MAGNESIUM: MAGNESIUM: 1.7 mg/dL — AB

## 2014-03-01 ENCOUNTER — Encounter (HOSPITAL_COMMUNITY): Payer: Medicaid Other

## 2014-03-10 ENCOUNTER — Encounter: Payer: Self-pay | Admitting: Internal Medicine

## 2014-03-27 LAB — URINALYSIS, COMPLETE
BILIRUBIN, UR: NEGATIVE
Glucose,UR: NEGATIVE mg/dL (ref 0–75)
Ketone: NEGATIVE
Nitrite: NEGATIVE
Ph: 8 (ref 4.5–8.0)
Protein: 100
SQUAMOUS EPITHELIAL: NONE SEEN
Specific Gravity: 1.024 (ref 1.003–1.030)
WBC UR: 1299 /HPF (ref 0–5)

## 2014-03-29 LAB — URINE CULTURE

## 2014-04-10 ENCOUNTER — Encounter: Payer: Self-pay | Admitting: Internal Medicine

## 2014-05-09 ENCOUNTER — Encounter
Admit: 2014-05-09 | Disposition: A | Discharge status: Home / Self Care | Payer: Self-pay | Attending: Internal Medicine | Admitting: Internal Medicine

## 2014-06-09 ENCOUNTER — Encounter
Admit: 2014-06-09 | Disposition: A | Discharge status: Home / Self Care | Payer: Self-pay | Attending: Internal Medicine | Admitting: Internal Medicine

## 2014-06-27 LAB — URINALYSIS, COMPLETE
BACTERIA: NONE SEEN
Bilirubin,UR: NEGATIVE
GLUCOSE, UR: NEGATIVE mg/dL (ref 0–75)
Ketone: NEGATIVE
Nitrite: NEGATIVE
PH: 8 (ref 4.5–8.0)
Protein: >=500
SQUAMOUS EPITHELIAL: NONE SEEN
Specific Gravity: 1.024 (ref 1.003–1.030)

## 2014-06-29 LAB — URINE CULTURE

## 2014-06-30 IMAGING — RF DG UGI W/ GASTROGRAFIN
15 of 17 series · 15 of 17 positions shown · non-contrast
Comparison: CT scan dated 06/27/2012

CLINICAL DATA: Pancreatitis.  Peripancreatic abscess.  Duodenal
edema.

ESOPHAGUS/BARIUM SWALLOW/TABLET STUDY
Fluoroscopy Time: 4 minutes 14 seconds slow pulsed fluoroscopy

[Series 1: run · 1 of 1 slices shown (1 of 14)]
[im 1/1]
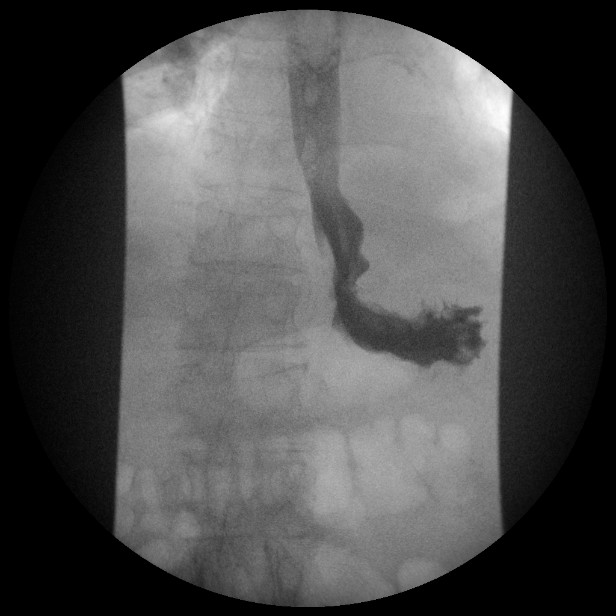

[Series 2: run · 1 of 1 slices shown (2 of 14)]
[im 1/1]
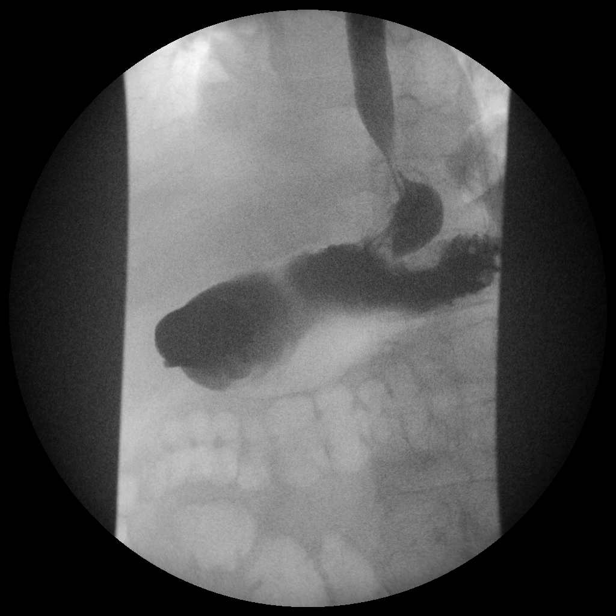

[Series 3: run · 1 of 1 slices shown (3 of 14)]
[im 1/1]
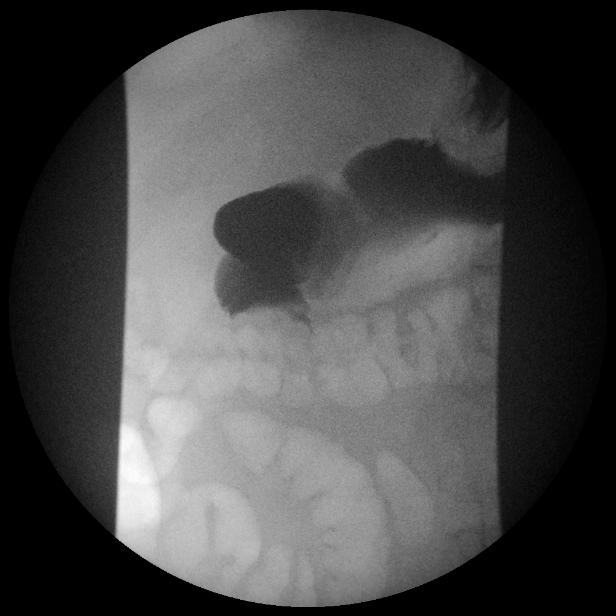

[Series 4: run · 1 of 1 slices shown (4 of 14)]
[im 1/1]
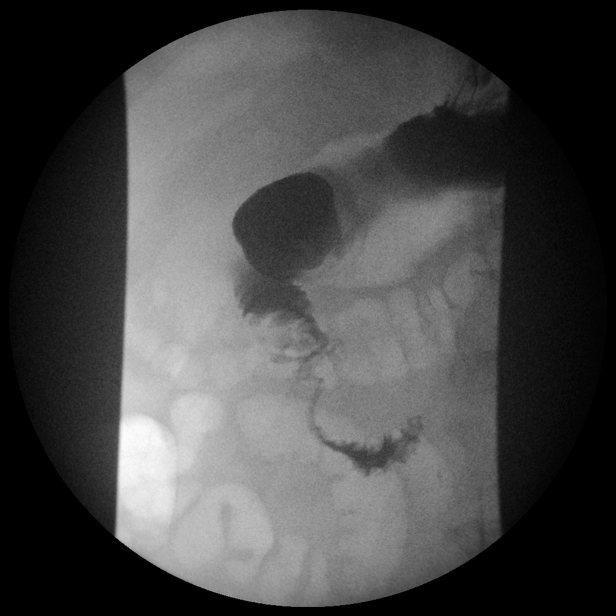

[Series 6: run · 1 of 1 slices shown (5 of 14)]
[im 1/1]
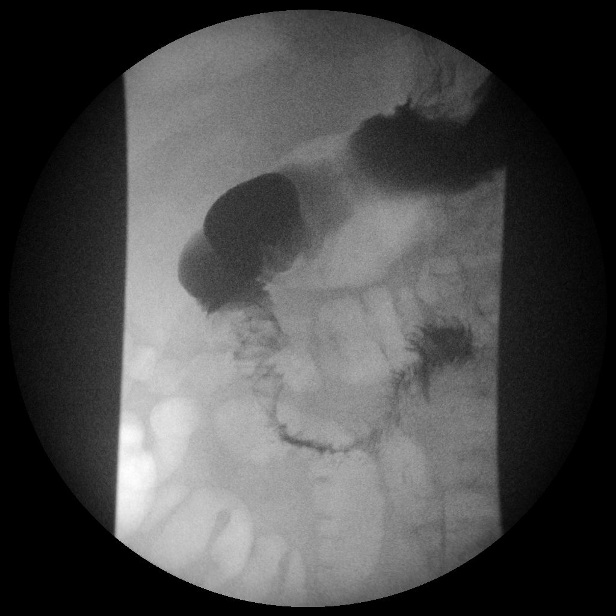

[Series 7: run · 1 of 1 slices shown (6 of 14)]
[im 1/1]
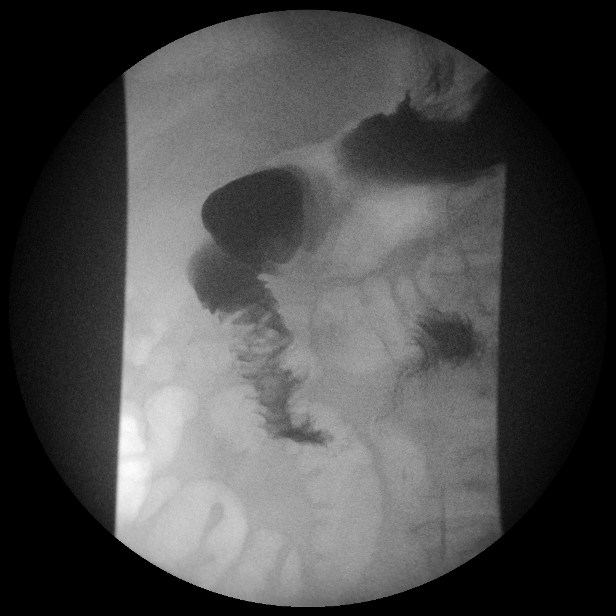

[Series 8: run · 1 of 1 slices shown (7 of 14)]
[im 1/1]
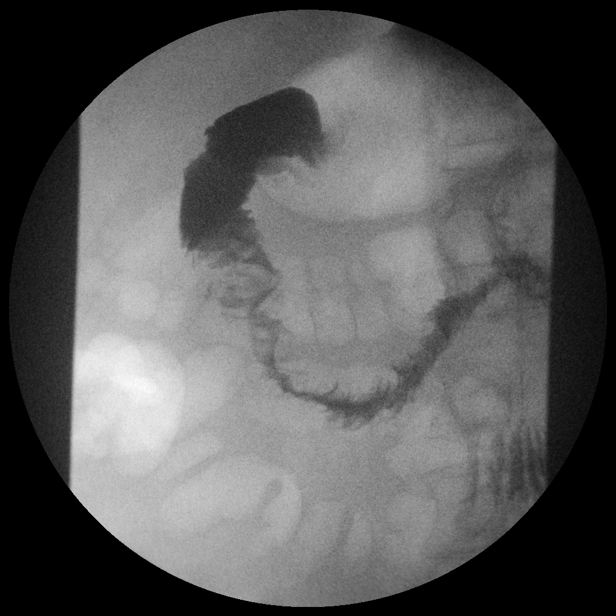

[Series 9: run · 1 of 1 slices shown (8 of 14)]
[im 1/1]
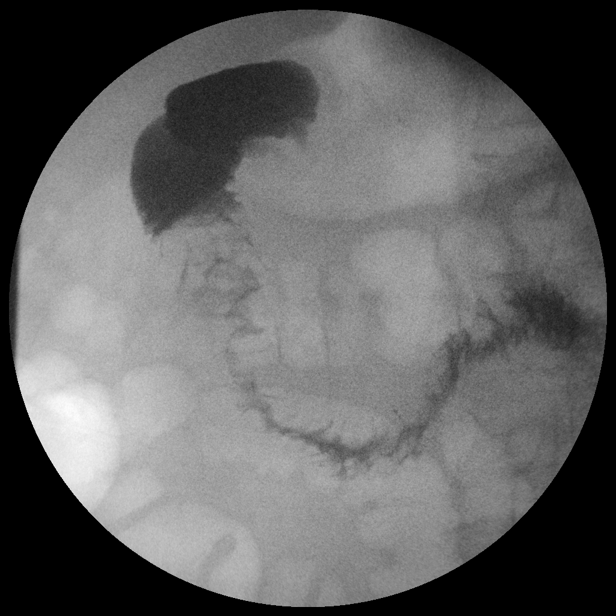

[Series 10: run · 1 of 1 slices shown (9 of 14)]
[im 1/1]
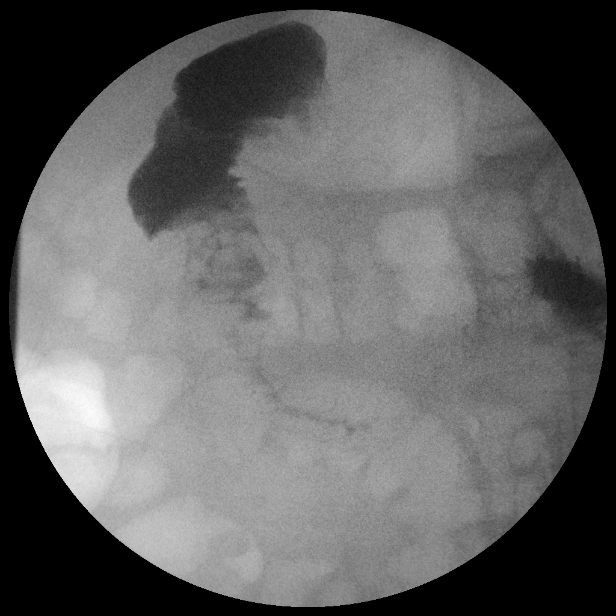

[Series 11: run · 1 of 1 slices shown (10 of 14)]
[im 1/1]
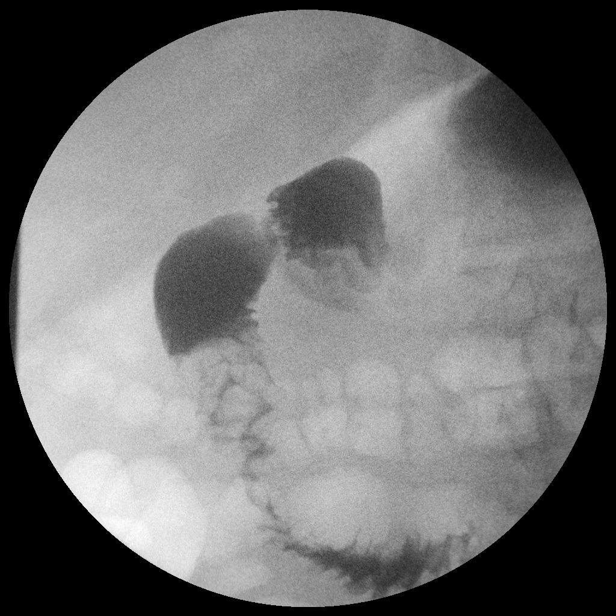

[Series 12: run · 1 of 1 slices shown (11 of 14)]
[im 1/1]
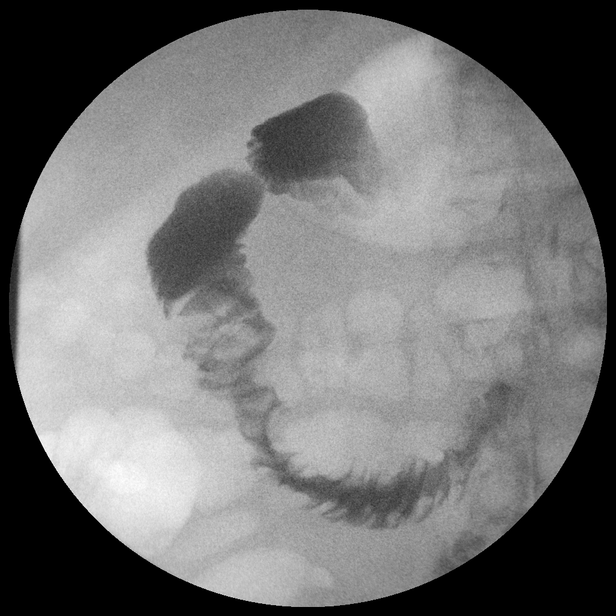

[Series 14: run · 1 of 1 slices shown (12 of 14)]
[im 1/1]
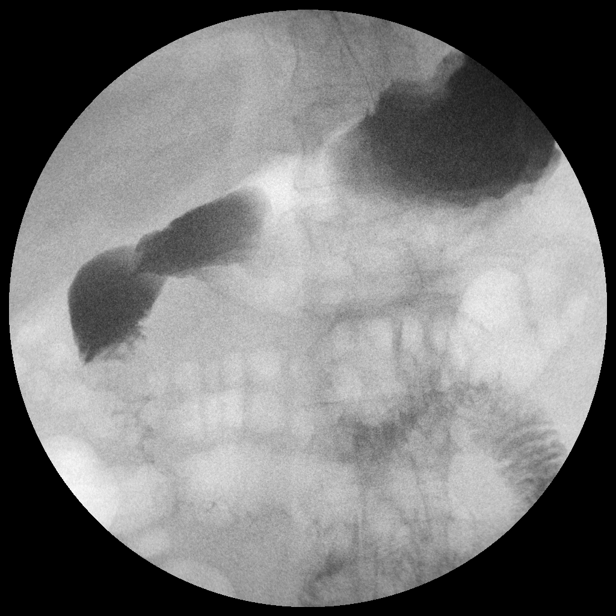

[Series 15: run · 1 of 1 slices shown (13 of 14)]
[im 1/1]
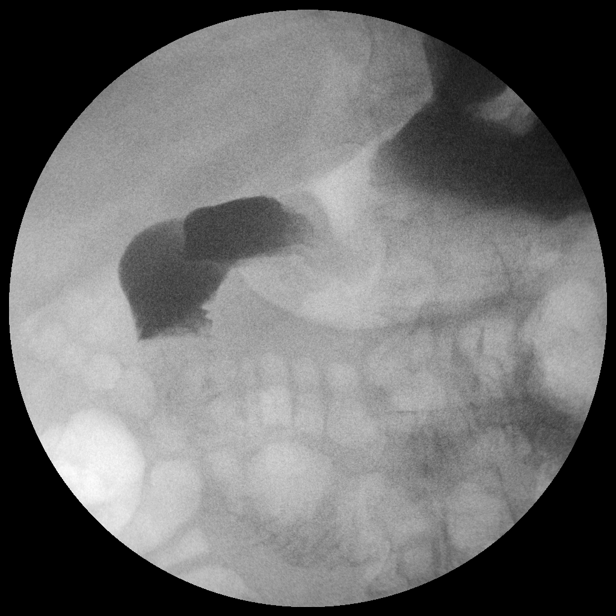

[Series 16: run · 1 of 1 slices shown (14 of 14)]
[im 1/1]
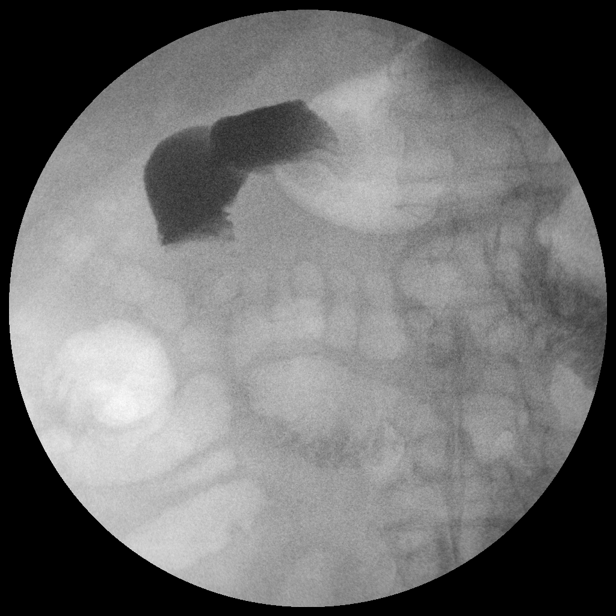

[Series 1001: view not recorded · 0.20mm/px · 1 of 1 slices shown]
[im 1/1]
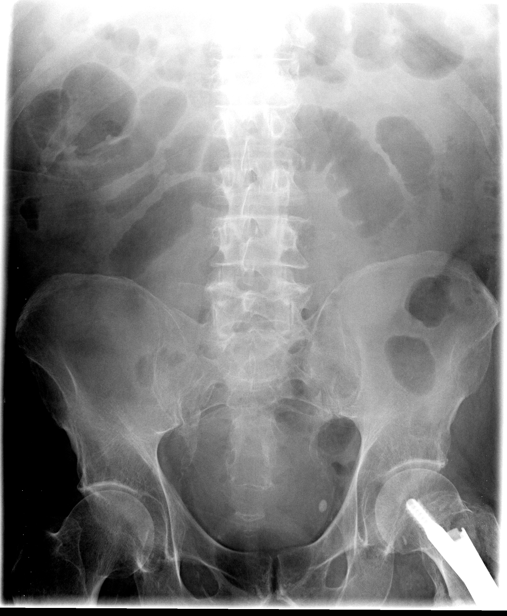

[15 of 17 positions shown; findings below may reference images not displayed]

FINDINGS: Scout image demonstrates slight dilatation of the single
small bowel loop in the mid abdomen.  This is suggestive of a
localized ileus.

The patient ingested water-soluble contrast.  The contrast passed
into the normal appearing pylorus and duodenal bulb.  There is
marked edema of the mucosa of the second and third portions of the
duodenum.  However, contrast did pass through that area.  There is
no visible duodenal ulcer.  No extravasation of contrast.
IMPRESSION: Marked edema of the mucosa of the second and third portions of the
duodenum without evidence of a duodenal ulcer.  The edema is most
likely secondary to the adjacent pancreatitis.

## 2014-07-10 ENCOUNTER — Encounter
Admission: RE | Admit: 2014-07-10 | Discharge: 2014-07-10 | Disposition: A | Discharge status: Home / Self Care | Payer: Medicaid Other | Source: Ambulatory Visit | Attending: Internal Medicine | Admitting: Internal Medicine

## 2014-08-09 ENCOUNTER — Encounter
Admission: RE | Admit: 2014-08-09 | Discharge: 2014-08-09 | Disposition: A | Discharge status: Home / Self Care | Payer: Medicaid Other | Source: Ambulatory Visit | Attending: Internal Medicine | Admitting: Internal Medicine

## 2014-09-08 ENCOUNTER — Encounter
Admission: RE | Admit: 2014-09-08 | Discharge: 2014-09-08 | Disposition: A | Discharge status: Home / Self Care | Payer: Medicaid Other | Source: Ambulatory Visit | Attending: Internal Medicine | Admitting: Internal Medicine

## 2014-09-08 DIAGNOSIS — R300 Dysuria: Secondary | ICD-10-CM | POA: Insufficient documentation

## 2014-09-08 DIAGNOSIS — R319 Hematuria, unspecified: Secondary | ICD-10-CM | POA: Insufficient documentation

## 2014-09-18 DIAGNOSIS — R319 Hematuria, unspecified: Secondary | ICD-10-CM | POA: Diagnosis present

## 2014-09-18 DIAGNOSIS — R300 Dysuria: Secondary | ICD-10-CM | POA: Diagnosis not present

## 2014-09-18 LAB — URINALYSIS COMPLETE WITH MICROSCOPIC (ARMC ONLY)
BILIRUBIN URINE: NEGATIVE
Bacteria, UA: NONE SEEN
Glucose, UA: NEGATIVE mg/dL
Ketones, ur: NEGATIVE mg/dL
NITRITE: NEGATIVE
SPECIFIC GRAVITY, URINE: 1.022 (ref 1.005–1.030)
SQUAMOUS EPITHELIAL / LPF: NONE SEEN
pH: 9 — ABNORMAL HIGH (ref 5.0–8.0)

## 2014-09-20 LAB — URINE CULTURE

## 2014-09-26 DIAGNOSIS — R300 Dysuria: Secondary | ICD-10-CM | POA: Diagnosis not present

## 2014-09-26 LAB — BASIC METABOLIC PANEL
Anion gap: 8 (ref 5–15)
BUN: 24 mg/dL — AB (ref 6–20)
CHLORIDE: 104 mmol/L (ref 101–111)
CO2: 27 mmol/L (ref 22–32)
Calcium: 9.6 mg/dL (ref 8.9–10.3)
Creatinine, Ser: 0.96 mg/dL (ref 0.61–1.24)
GFR calc Af Amer: >60 mL/min (ref >60)
GFR calc non Af Amer: >60 mL/min (ref >60)
Glucose, Bld: 80 mg/dL (ref 65–99)
Potassium: 4.5 mmol/L (ref 3.5–5.1)
Sodium: 139 mmol/L (ref 135–145)

## 2014-09-26 LAB — VITAMIN B12: VITAMIN B 12: 266 pg/mL (ref 180–914)

## 2014-09-26 LAB — MAGNESIUM: MAGNESIUM: 1.8 mg/dL (ref 1.7–2.4)

## 2014-10-09 ENCOUNTER — Encounter
Admission: RE | Admit: 2014-10-09 | Discharge: 2014-10-09 | Disposition: A | Discharge status: Home / Self Care | Payer: Medicaid Other | Source: Ambulatory Visit | Attending: Internal Medicine | Admitting: Internal Medicine

## 2014-11-09 ENCOUNTER — Encounter
Admission: RE | Admit: 2014-11-09 | Discharge: 2014-11-09 | Disposition: A | Discharge status: Home / Self Care | Payer: Medicaid Other | Source: Ambulatory Visit | Attending: Internal Medicine | Admitting: Internal Medicine

## 2014-11-09 DIAGNOSIS — R319 Hematuria, unspecified: Secondary | ICD-10-CM | POA: Insufficient documentation

## 2014-11-09 DIAGNOSIS — R300 Dysuria: Secondary | ICD-10-CM | POA: Insufficient documentation

## 2014-12-09 ENCOUNTER — Encounter
Admission: RE | Admit: 2014-12-09 | Discharge: 2014-12-09 | Disposition: A | Discharge status: Home / Self Care | Payer: Medicaid Other | Source: Ambulatory Visit | Attending: Internal Medicine | Admitting: Internal Medicine

## 2015-01-09 ENCOUNTER — Encounter
Admission: RE | Admit: 2015-01-09 | Discharge: 2015-01-09 | Disposition: A | Discharge status: Home / Self Care | Payer: Medicaid Other | Source: Ambulatory Visit | Attending: Internal Medicine | Admitting: Internal Medicine

## 2015-02-08 ENCOUNTER — Encounter
Admission: RE | Admit: 2015-02-08 | Discharge: 2015-02-08 | Disposition: A | Discharge status: Home / Self Care | Payer: Medicaid Other | Source: Ambulatory Visit | Attending: Internal Medicine | Admitting: Internal Medicine

## 2015-03-11 ENCOUNTER — Encounter
Admission: RE | Admit: 2015-03-11 | Discharge: 2015-03-11 | Disposition: A | Discharge status: Home / Self Care | Payer: Medicaid Other | Source: Ambulatory Visit | Attending: Internal Medicine | Admitting: Internal Medicine

## 2015-03-11 DIAGNOSIS — K219 Gastro-esophageal reflux disease without esophagitis: Secondary | ICD-10-CM | POA: Insufficient documentation

## 2015-03-11 DIAGNOSIS — F0390 Unspecified dementia without behavioral disturbance: Secondary | ICD-10-CM | POA: Insufficient documentation

## 2015-04-03 DIAGNOSIS — F0390 Unspecified dementia without behavioral disturbance: Secondary | ICD-10-CM | POA: Diagnosis not present

## 2015-04-03 DIAGNOSIS — K219 Gastro-esophageal reflux disease without esophagitis: Secondary | ICD-10-CM | POA: Diagnosis present

## 2015-04-03 LAB — COMPREHENSIVE METABOLIC PANEL
ALT: 13 U/L — AB (ref 17–63)
AST: 13 U/L — ABNORMAL LOW (ref 15–41)
Albumin: 4.3 g/dL (ref 3.5–5.0)
Alkaline Phosphatase: 60 U/L (ref 38–126)
Anion gap: 7 (ref 5–15)
BUN: 24 mg/dL — ABNORMAL HIGH (ref 6–20)
CO2: 28 mmol/L (ref 22–32)
CREATININE: 0.81 mg/dL (ref 0.61–1.24)
Calcium: 9.8 mg/dL (ref 8.9–10.3)
Chloride: 104 mmol/L (ref 101–111)
GFR calc Af Amer: >60 mL/min (ref >60)
GLUCOSE: 94 mg/dL (ref 65–99)
Potassium: 4.5 mmol/L (ref 3.5–5.1)
Sodium: 139 mmol/L (ref 135–145)
TOTAL PROTEIN: 7.9 g/dL (ref 6.5–8.1)
Total Bilirubin: 0.5 mg/dL (ref 0.3–1.2)

## 2015-04-03 LAB — CBC WITH DIFFERENTIAL/PLATELET
Basophils Absolute: 0 10*3/uL (ref 0–0.1)
Basophils Relative: 0 %
Eosinophils Absolute: 0.3 10*3/uL (ref 0–0.7)
Eosinophils Relative: 4 %
HCT: 44.1 % (ref 40.0–52.0)
HEMOGLOBIN: 14.4 g/dL (ref 13.0–18.0)
LYMPHS PCT: 27 %
Lymphs Abs: 1.9 10*3/uL (ref 1.0–3.6)
MCH: 28.8 pg (ref 26.0–34.0)
MCHC: 32.6 g/dL (ref 32.0–36.0)
MCV: 88.2 fL (ref 80.0–100.0)
MONO ABS: 0.5 10*3/uL (ref 0.2–1.0)
Monocytes Relative: 7 %
NEUTROS ABS: 4.5 10*3/uL (ref 1.4–6.5)
NEUTROS PCT: 62 %
Platelets: 169 10*3/uL (ref 150–440)
RBC: 5.01 MIL/uL (ref 4.40–5.90)
RDW: 13.3 % (ref 11.5–14.5)
WBC: 7.2 10*3/uL (ref 3.8–10.6)

## 2015-04-03 LAB — MAGNESIUM: MAGNESIUM: 2 mg/dL (ref 1.7–2.4)

## 2015-04-03 LAB — VITAMIN B12: Vitamin B-12: 1096 pg/mL — ABNORMAL HIGH (ref 180–914)

## 2015-04-03 LAB — TSH: TSH: 1.941 u[IU]/mL (ref 0.350–4.500)

## 2015-04-04 LAB — VITAMIN D 25 HYDROXY (VIT D DEFICIENCY, FRACTURES): VIT D 25 HYDROXY: 22.4 ng/mL — AB (ref 30.0–100.0)

## 2015-04-11 ENCOUNTER — Encounter
Admission: RE | Admit: 2015-04-11 | Discharge: 2015-04-11 | Disposition: A | Discharge status: Home / Self Care | Payer: Medicaid Other | Source: Ambulatory Visit | Attending: Internal Medicine | Admitting: Internal Medicine

## 2015-05-09 ENCOUNTER — Encounter
Admission: RE | Admit: 2015-05-09 | Discharge: 2015-05-09 | Disposition: A | Discharge status: Home / Self Care | Payer: Medicaid Other | Source: Ambulatory Visit | Attending: Internal Medicine | Admitting: Internal Medicine

## 2015-05-10 DIAGNOSIS — F325 Major depressive disorder, single episode, in full remission: Secondary | ICD-10-CM

## 2015-05-10 DIAGNOSIS — M810 Age-related osteoporosis without current pathological fracture: Secondary | ICD-10-CM

## 2015-05-10 DIAGNOSIS — Z8719 Personal history of other diseases of the digestive system: Secondary | ICD-10-CM

## 2015-05-10 HISTORY — DX: Personal history of other diseases of the digestive system: Z87.19

## 2015-05-10 HISTORY — DX: Major depressive disorder, single episode, in full remission: F32.5

## 2015-05-10 HISTORY — DX: Age-related osteoporosis without current pathological fracture: M81.0

## 2015-05-29 DIAGNOSIS — F1027 Alcohol dependence with alcohol-induced persisting dementia: Secondary | ICD-10-CM

## 2015-05-29 HISTORY — DX: Alcohol dependence with alcohol-induced persisting dementia: F10.27

## 2015-06-09 ENCOUNTER — Encounter
Admission: RE | Admit: 2015-06-09 | Discharge: 2015-06-09 | Disposition: A | Discharge status: Home / Self Care | Payer: Medicaid Other | Source: Ambulatory Visit | Attending: Internal Medicine | Admitting: Internal Medicine

## 2015-07-09 ENCOUNTER — Encounter
Admission: RE | Admit: 2015-07-09 | Discharge: 2015-07-09 | Disposition: A | Discharge status: Home / Self Care | Payer: Medicaid Other | Source: Ambulatory Visit | Attending: Internal Medicine | Admitting: Internal Medicine

## 2015-08-09 ENCOUNTER — Encounter
Admission: RE | Admit: 2015-08-09 | Discharge: 2015-08-09 | Disposition: A | Discharge status: Home / Self Care | Payer: Medicaid Other | Source: Ambulatory Visit | Attending: Internal Medicine | Admitting: Internal Medicine

## 2015-09-08 ENCOUNTER — Encounter
Admission: RE | Admit: 2015-09-08 | Discharge: 2015-09-08 | Disposition: A | Discharge status: Home / Self Care | Payer: Medicaid Other | Source: Ambulatory Visit | Attending: Internal Medicine | Admitting: Internal Medicine

## 2015-10-09 ENCOUNTER — Encounter
Admission: RE | Admit: 2015-10-09 | Discharge: 2015-10-09 | Disposition: A | Discharge status: Home / Self Care | Payer: Medicaid Other | Source: Ambulatory Visit | Attending: Internal Medicine | Admitting: Internal Medicine

## 2015-10-10 ENCOUNTER — Non-Acute Institutional Stay (SKILLED_NURSING_FACILITY): Payer: Medicare Other | Admitting: Gerontology

## 2015-10-10 DIAGNOSIS — F411 Generalized anxiety disorder: Secondary | ICD-10-CM | POA: Diagnosis not present

## 2015-10-10 DIAGNOSIS — G894 Chronic pain syndrome: Secondary | ICD-10-CM

## 2015-10-11 NOTE — Progress Notes (Signed)
Location:      Place of Service:  SNF (31) Provider:  Lorenso Quarry, NP-C  Lauro Regulus., MD  Patient Care Team: Lauro Regulus, MD as PCP - General (Unknown Physician Specialty) Durene Romans, MD as Consulting Physician (Orthopedic Surgery)  Extended Emergency Contact Information Primary Emergency Contact: Tula Nakayama Address: [address]          [place], [place] [address] [place] of [place] Home Phone: [phone] Mobile Phone: [phone] Relation: Sister Secondary Emergency Contact: Livingston Diones Address: [address]          [place], [place] [address] Home Phone: [phone] Relation: None  Code Status:  Full  Goals of care: Advanced Directive information Advanced Directives 06/24/2012  Does patient have an advance directive? Patient does not have advance directive  Pre-existing out of facility DNR order (yellow form or pink MOST form) No     Chief Complaint  Patient presents with  . Medical Management of Chronic Issues    HPI:  Pt is a 65 y.o. [name] seen today for an acute visit for management of medications for chronic issues. He has a h/o Generalized Anxiety Disorder and Chronic Pain Syndrome. He has had multiple fractures of the hip and humerus. The anxiety is manifested by pt yelling out "damn, Damn" repeatedly throughout the day in a Tourette's - like fashion. This can be disturbing at times to roommates, other residents and families. Pt is minimally verbal. However, he does state that he is feeling fine. Denies pain, chest pain, dyspnea.    Past Medical History:  Diagnosis Date  . Alcoholic hepatitis 2001  . Anxiety   . Chronic pain   . Epistaxis 2001   required cauterization  . Fracture of left humerus   . GERD (gastroesophageal reflux disease)   . History of alcoholism   . Hypertonicity of bladder   . Lumbar spondylitis   . Stroke   . 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   . Thrombus of left atrial appendage 2003  .  Urinary incontinence    Past Surgical History:  Procedure Laterality Date  . FEMUR IM NAIL Left 06/25/2012   Procedure: INTRAMEDULLARY (IM) NAIL FEMORAL;  Surgeon: Shelda Pal, MD;  Location: [facility];  Service: Orthopedics;  Laterality: Left;  . HAND SURGERY Bilateral 2003   crush injuries with multitrauma  . KNEE SURGERY  1996    Allergies  Allergen Reactions  . Cefazolin Rash  . Penicillins     REACTION: unknown allergy - hx since childhood      Medication List       Accurate as of 10/10/15 11:59 PM. Always use your most recent med list.          acetaminophen 500 MG tablet Commonly known as:  TYLENOL Take 2 tablets (1,000 mg total) by mouth 3 (three) times daily.   camphor-menthol lotion Commonly known as:  SARNA Apply topically as needed for itching.   cholecalciferol 1000 units tablet Commonly known as:  VITAMIN D Take 1,000 Units by mouth every morning.   clopidogrel 75 MG tablet Commonly known as:  PLAVIX Take 75 mg by mouth every morning.   enoxaparin 100 MG/ML injection Commonly known as:  LOVENOX Inject 1 mL (100 mg total) into the skin daily.   metoprolol 100 MG tablet Commonly known as:  LOPRESSOR Take 1 tablet (100 mg total) by mouth 2 (two) times daily.   metoprolol succinate 100 MG 24 hr tablet Commonly known as:  TOPROL-XL  Take 100 mg by mouth daily. Take with or immediately following a meal.   NON FORMULARY Apply 1 mL topically every 6 (six) hours.   omeprazole 20 MG capsule Commonly known as:  PRILOSEC Take 40 mg by mouth every morning.   ondansetron 4 MG tablet Commonly known as:  ZOFRAN Take 1 tablet (4 mg total) by mouth every 6 (six) hours as needed for nausea.   OVER THE COUNTER MEDICATION   oxyCODONE 5 MG immediate release tablet Commonly known as:  Oxy IR/ROXICODONE Take 1-2 tablets (5-10 mg total) by mouth every 4 (four) hours as needed.   potassium chloride SA 20 MEQ tablet Commonly known as:  K-DUR,KLOR-CON Take 1  tablet (20 mEq total) by mouth daily.   psyllium 95 % Pack Commonly known as:  HYDROCIL/METAMUCIL Take 1 packet by mouth 2 (two) times daily.   saccharomyces boulardii 250 MG capsule Commonly known as:  FLORASTOR Take 1 capsule (250 mg total) by mouth 2 (two) times daily.   tamsulosin 0.4 MG Caps capsule Commonly known as:  FLOMAX Take 0.4 mg by mouth at bedtime.       Review of Systems  Unable to perform ROS: Dementia  Constitutional: Negative for appetite change, chills and fever.  HENT: Negative for congestion, sneezing, sore throat, trouble swallowing and voice change.   Respiratory: Negative for apnea, cough, choking, chest tightness, shortness of breath and wheezing.   Cardiovascular: Negative for chest pain, palpitations and leg swelling.  Gastrointestinal: Negative for abdominal distention and abdominal pain.  Genitourinary: Negative for difficulty urinating, dysuria, frequency and urgency.  Musculoskeletal: Negative for back pain, gait problem and myalgias. Arthralgias: typical arthritis.  Skin: Negative for color change, pallor, rash and wound.  Neurological: Negative for dizziness, tremors, syncope, speech difficulty, weakness, numbness and headaches.  Psychiatric/Behavioral: Negative for agitation and behavioral problems.  All other systems reviewed and are negative.   Immunization History  Administered Date(s) Administered  . Influenza Whole 01/02/2010  . Td 01/10/2010   Pertinent  Health Maintenance Due  Topic Date Due  . COLONOSCOPY  08/23/2000  . PNA vac Low Risk Adult (1 of 2 - PCV13) 08/24/2015  . INFLUENZA VACCINE  10/09/2015   Fall Risk  07/27/2012  Falls in the past year? No   Functional Status Survey:    Vitals:   10/10/15 2207  BP: 119/68  Pulse: 78  Resp: 16  Temp: 98 F (36.7 C)  SpO2: 97%  Weight: 144 lb 1.6 oz (65.4 kg)   Body mass index is 26.52 kg/m. Physical Exam  Constitutional: He is oriented to person, place, and time.  Vital signs are normal. He appears well-developed and well-nourished. He is active and cooperative. He does not appear ill. No distress.  HENT:  Head: Normocephalic and atraumatic.  Mouth/Throat: Uvula is midline, oropharynx is clear and moist and mucous membranes are normal. Mucous membranes are not pale, not dry and not cyanotic.  Eyes: Conjunctivae, EOM and lids are normal. Pupils are equal, round, and reactive to light.  Neck: Trachea normal, normal range of motion and full passive range of motion without pain. Neck supple. No JVD present. No tracheal deviation, no edema and no erythema present. No thyromegaly present.  Cardiovascular: Normal rate, intact distal pulses and normal pulses.  An irregular rhythm present. Exam reveals distant heart sounds. Exam reveals no gallop and no friction rub.   No murmur heard. Pulmonary/Chest: Effort normal and breath sounds normal. No accessory muscle usage. No respiratory distress. He has  no wheezes. He has no rhonchi. He has no rales. He exhibits no tenderness.  Abdominal: Normal appearance and bowel sounds are normal. He exhibits no distension and no ascites. There is no tenderness.  Musculoskeletal: Normal range of motion. He exhibits no edema or tenderness.  Expected osteoarthritis, stiffness  Neurological: He is alert and oriented to person, place, and time. He has normal strength. He displays no tremor. He displays no seizure activity. Coordination and gait abnormal.  Skin: Skin is warm, dry and intact. He is not diaphoretic. No cyanosis. No pallor. Nails show no clubbing.  Psychiatric: He has a normal mood and affect. His speech is normal and behavior is normal. Judgment and thought content normal. Cognition and memory are normal.  Nursing note and vitals reviewed.   Labs reviewed:  Recent Labs  04/03/15 1047  NA 139  K 4.5  CL 104  CO2 28  GLUCOSE 94  BUN 24*  CREATININE 0.81  CALCIUM 9.8  MG 2.0    Recent Labs  04/03/15 1047    AST 13*  ALT 13*  ALKPHOS 60  BILITOT 0.5  PROT 7.9  ALBUMIN 4.3    Recent Labs  04/03/15 1047  WBC 7.2  NEUTROABS 4.5  HGB 14.4  HCT 44.1  MCV 88.2  PLT 169   Lab Results  Component Value Date   TSH 1.941 04/03/2015   No results found for: HGBA1C Lab Results  Component Value Date   CHOL 86 07/12/2012   HDL 51 01/08/2010   LDLCALC 126 (H) 01/08/2010   TRIG 88 07/12/2012   CHOLHDL 3.8 Ratio 01/08/2010    Significant Diagnostic Results in last 30 days:  No results found.  Assessment/Plan 1. Generalized anxiety disorder  Clonidine 0.1 mg tablets- give 1/4 tablet (0.025 mg) x 4 days, then increase to 1/2 tablet (0.05 mg) daily for anxiety d/o, tourette's behaviors  Decrease metoprolol to 25 mg bid  Continue lorazepam 0.5 mg bid and 1 mg IM prn  Continue sertraline 150 mg po q day  2. Chronic Pain Syndrome  Increase gabapentin 100 mg to tid  Continue scheduled tylenol  Continue scheduled tramadol  Family/ staff Communication:   Total Time: 30 minutes  Documentation: 15 minutes  Face to Face: 15 minutes  Family/Phone:   Labs/tests ordered:  none  Medication list reviewed and assessed for continued appropriateness.  Brynda Rim, NP-C [facility] [phone], [place] [address] Cell Phone (Mon-Fri 8am-5pm):  [phone] On Call:  [phone] & follow prompts after 5pm & weekends Office Phone:  [phone] Office Fax:  [phone]

## 2015-11-09 ENCOUNTER — Encounter
Admission: RE | Admit: 2015-11-09 | Discharge: 2015-11-09 | Disposition: A | Discharge status: Home / Self Care | Payer: Medicare Other | Source: Ambulatory Visit | Attending: Internal Medicine | Admitting: Internal Medicine

## 2015-11-09 DIAGNOSIS — R300 Dysuria: Secondary | ICD-10-CM | POA: Insufficient documentation

## 2015-11-30 DIAGNOSIS — R300 Dysuria: Secondary | ICD-10-CM | POA: Diagnosis present

## 2015-11-30 LAB — URINALYSIS COMPLETE WITH MICROSCOPIC (ARMC ONLY)
BILIRUBIN URINE: NEGATIVE
GLUCOSE, UA: NEGATIVE mg/dL
Ketones, ur: NEGATIVE mg/dL
Nitrite: NEGATIVE
Protein, ur: 100 mg/dL — AB
Specific Gravity, Urine: 1.025 (ref 1.005–1.030)
Squamous Epithelial / LPF: NONE SEEN
pH: 7 (ref 5.0–8.0)

## 2015-12-02 LAB — URINE CULTURE

## 2015-12-03 ENCOUNTER — Non-Acute Institutional Stay (SKILLED_NURSING_FACILITY): Payer: Medicare Other | Admitting: Gerontology

## 2015-12-03 DIAGNOSIS — N390 Urinary tract infection, site not specified: Secondary | ICD-10-CM

## 2015-12-03 NOTE — Progress Notes (Signed)
Location:      Place of Service:  SNF (31) Provider:  Lorenso QuarryShannon Colbi Schiltz, NP-C  Lauro RegulusANDERSON,MARSHALL W., MD  Patient Care Team: Lauro RegulusMarshall W Anderson, MD as PCP - General (Unknown Physician Specialty) Durene RomansMatthew Olin, MD as Consulting Physician (Orthopedic Surgery)  Extended Emergency Contact Information Primary Emergency Contact: Tula NakayamaSockwell,[name] Address: [address]          [place], [place] [address] [place] Home Phone: [phone] Mobile Phone: [phone] Relation: Sister Secondary Emergency Contact: Livingston DionesSOCKWELL,[name] Y Address: [address]          [place], [place] [address] Home Phone: [phone] Relation: None  Code Status:  full Goals of care: Advanced Directive information Advanced Directives 06/24/2012  Does patient have an advance directive? Patient does not have advance directive  Pre-existing out of facility DNR order (yellow form or pink MOST form) No     Chief Complaint  Patient presents with  . Acute Visit    HPI: UTI. UTI.. UTI. Pt is a 65 y.o. male seen today for an acute visit for UTI. Staff reports increased confusion and agitation. Patient sustained a fall last week. Patient is a very poor historian, so complete ROS was difficult. Patient unable to accurately endorse or deny pain, frequency, burning with urination. Vital signs stable. No other complaints.   Past Medical History:  Diagnosis Date  . Alcoholic hepatitis 2001  . Anxiety   . Chronic pain   . Epistaxis 2001   required cauterization  . Fracture of left humerus   . GERD (gastroesophageal reflux disease)   . History of alcoholism   . Hypertonicity of bladder   . Lumbar spondylitis   . Stroke   . 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   . Thrombus of left atrial appendage 2003  . Urinary incontinence    Past Surgical History:  Procedure Laterality Date  . FEMUR IM NAIL Left 06/25/2012   Procedure: INTRAMEDULLARY (IM) NAIL FEMORAL;  Surgeon: Shelda PalMatthew D Olin, MD;  Location: [facility];  Service: Orthopedics;  Laterality: Left;  . HAND SURGERY Bilateral 2003   crush injuries with multitrauma  . KNEE SURGERY  1996    Allergies  Allergen Reactions  . Cefazolin Rash  . Penicillins     REACTION: unknown allergy - hx since childhood      Medication List       Accurate as of 12/03/15  5:51 PM. Always use your most recent med list.          acetaminophen 500 MG tablet Commonly known as:  TYLENOL Take 2 tablets (1,000 mg total) by mouth 3 (three) times daily.   camphor-menthol lotion Commonly known as:  SARNA Apply topically as needed for itching.   cholecalciferol 1000 units tablet Commonly known as:  VITAMIN D Take 1,000 Units by mouth every morning.   clopidogrel 75 MG tablet Commonly known as:  PLAVIX Take 75 mg by mouth every morning.   enoxaparin 100 MG/ML injection Commonly known as:  LOVENOX Inject 1 mL (100 mg total) into the skin daily.   metoprolol 100 MG tablet Commonly known as:  LOPRESSOR Take 1 tablet (100 mg total) by mouth 2 (two) times daily.   metoprolol succinate 100 MG 24 hr tablet Commonly known as:  TOPROL-XL Take 100 mg by mouth daily. Take with or immediately following a meal.   NON FORMULARY Apply 1 mL topically every 6 (six) hours.   omeprazole 20 MG capsule Commonly known as:  PRILOSEC Take  40 mg by mouth every morning.   ondansetron 4 MG tablet Commonly known as:  ZOFRAN Take 1 tablet (4 mg total) by mouth every 6 (six) hours as needed for nausea.   OVER THE COUNTER MEDICATION   oxyCODONE 5 MG immediate release tablet Commonly known as:  Oxy IR/ROXICODONE Take 1-2 tablets (5-10 mg total) by mouth every 4 (four) hours as needed.   potassium chloride SA 20 MEQ tablet Commonly known as:  K-DUR,KLOR-CON Take 1 tablet (20 mEq total) by mouth daily.   psyllium 95 % Pack Commonly known as:  HYDROCIL/METAMUCIL Take 1 packet by mouth 2 (two) times daily.   saccharomyces boulardii 250 MG capsule Commonly  known as:  FLORASTOR Take 1 capsule (250 mg total) by mouth 2 (two) times daily.   tamsulosin 0.4 MG Caps capsule Commonly known as:  FLOMAX Take 0.4 mg by mouth at bedtime.       Review of Systems  Unable to perform ROS: Dementia  Constitutional: Negative for appetite change, chills and fever.  Respiratory: Negative for apnea, cough, choking, chest tightness, shortness of breath and wheezing.   Cardiovascular: Negative for chest pain, palpitations and leg swelling.  Gastrointestinal: Negative for abdominal distention and abdominal pain.  Musculoskeletal: Arthralgias: typical arthritis.  Skin: Negative for color change, pallor, rash and wound.  Psychiatric/Behavioral: Positive for agitation and confusion. Negative for behavioral problems.  All other systems reviewed and are negative.   Immunization History  Administered Date(s) Administered  . Influenza Whole 01/02/2010  . Td 01/10/2010   Pertinent  Health Maintenance Due  Topic Date Due  . COLONOSCOPY  08/23/2000  . PNA vac Low Risk Adult (1 of 2 - PCV13) 08/24/2015  . INFLUENZA VACCINE  10/09/2015   Fall Risk  07/27/2012  Falls in the past year? No   Functional Status Survey:    Vitals:   11/28/15 0500  BP: 121/73  Pulse: 82  Resp: 18  Temp: 97.8 F (36.6 C)  SpO2: 95%  Weight: 141 lb (64 kg)   Body mass index is 25.95 kg/m. Physical Exam  Constitutional: He is oriented to person, place, and time. Vital signs are normal. He appears well-developed and well-nourished. He is active and cooperative. He does not appear ill. No distress.  HENT:  Head: Normocephalic and atraumatic.  Mouth/Throat: Uvula is midline, oropharynx is clear and moist and mucous membranes are normal. Mucous membranes are not pale, not dry and not cyanotic.  Eyes: Conjunctivae, EOM and lids are normal. Pupils are equal, round, and reactive to light.  Neck: Trachea normal, normal range of motion and full passive range of motion without pain.  Neck supple. No JVD present. No tracheal deviation, no edema and no erythema present. No thyromegaly present.  Cardiovascular: Normal rate, intact distal pulses and normal pulses.  An irregular rhythm present. Exam reveals distant heart sounds. Exam reveals no gallop and no friction rub.   No murmur heard. Pulmonary/Chest: Effort normal and breath sounds normal. No accessory muscle usage. No respiratory distress. He has no wheezes. He has no rhonchi. He has no rales. He exhibits no tenderness.  Abdominal: Normal appearance and bowel sounds are normal. He exhibits no distension and no ascites. There is no tenderness.  Musculoskeletal: Normal range of motion. He exhibits no edema or tenderness.  Expected osteoarthritis, stiffness  Neurological: He is alert and oriented to person, place, and time. He has normal strength. He displays no tremor. He displays no seizure activity. Coordination and gait abnormal.  Skin:  Skin is warm, dry and intact. No rash noted. He is not diaphoretic. No cyanosis or erythema. No pallor. Nails show no clubbing.  Psychiatric: He has a normal mood and affect. His speech is normal and behavior is normal. Judgment and thought content normal. Cognition and memory are normal.  Nursing note and vitals reviewed.   Labs reviewed:  Recent Labs  04/03/15 1047  NA 139  K 4.5  CL 104  CO2 28  GLUCOSE 94  BUN 24*  CREATININE 0.81  CALCIUM 9.8  MG 2.0    Recent Labs  04/03/15 1047  AST 13*  ALT 13*  ALKPHOS 60  BILITOT 0.5  PROT 7.9  ALBUMIN 4.3    Recent Labs  04/03/15 1047  WBC 7.2  NEUTROABS 4.5  HGB 14.4  HCT 44.1  MCV 88.2  PLT 169   Lab Results  Component Value Date   TSH 1.941 04/03/2015   No results found for: HGBA1C Lab Results  Component Value Date   CHOL 86 07/12/2012   HDL 51 01/08/2010   LDLCALC 126 (H) 01/08/2010   TRIG 88 07/12/2012   CHOLHDL 3.8 Ratio 01/08/2010    Significant Diagnostic Results in last 30 days:  No results  found.  Assessment/Plan 1. Urinary tract infection, site not specified  Bactrim DS 1 tab by mouth every 12 hours 14 days for UTI from Proteus mirabilis  No signs of sepsis at this time  Family/ staff Communication:   Total Time:  Documentation:  Face to Face:  Family/Phone:   Labs/tests ordered: UA and C&S   Medication list reviewed and assessed for continued appropriateness.  Brynda Rim, NP-C [facility]iatric[facility] [phone] [address]. [address], [place] [address] Cell Phone (Mon-Fri 8am-5pm):  [phone] On Call:  [phone] & follow prompts after 5pm & weekends Office Phone:  [phone] Office Fax:  [phone]

## 2015-12-09 ENCOUNTER — Encounter
Admission: RE | Admit: 2015-12-09 | Discharge: 2015-12-09 | Disposition: A | Discharge status: Home / Self Care | Payer: Medicare Other | Source: Ambulatory Visit | Attending: Internal Medicine | Admitting: Internal Medicine

## 2015-12-09 DIAGNOSIS — I5020 Unspecified systolic (congestive) heart failure: Secondary | ICD-10-CM | POA: Insufficient documentation

## 2015-12-25 ENCOUNTER — Non-Acute Institutional Stay (SKILLED_NURSING_FACILITY): Payer: Medicare Other | Admitting: Gerontology

## 2015-12-25 DIAGNOSIS — G894 Chronic pain syndrome: Secondary | ICD-10-CM | POA: Diagnosis not present

## 2015-12-25 DIAGNOSIS — I5020 Unspecified systolic (congestive) heart failure: Secondary | ICD-10-CM | POA: Diagnosis not present

## 2015-12-25 DIAGNOSIS — F411 Generalized anxiety disorder: Secondary | ICD-10-CM | POA: Diagnosis not present

## 2015-12-25 LAB — CBC WITH DIFFERENTIAL/PLATELET
BASOS ABS: 0 10*3/uL (ref 0–0.1)
Basophils Relative: 0 %
Eosinophils Absolute: 0.1 10*3/uL (ref 0–0.7)
Eosinophils Relative: 3 %
HEMATOCRIT: 42.6 % (ref 40.0–52.0)
Hemoglobin: 14.3 g/dL (ref 13.0–18.0)
Lymphocytes Relative: 43 %
Lymphs Abs: 2.4 10*3/uL (ref 1.0–3.6)
MCH: 29.8 pg (ref 26.0–34.0)
MCHC: 33.7 g/dL (ref 32.0–36.0)
MCV: 88.4 fL (ref 80.0–100.0)
Monocytes Absolute: 0.4 10*3/uL (ref 0.2–1.0)
Monocytes Relative: 7 %
NEUTROS ABS: 2.7 10*3/uL (ref 1.4–6.5)
Neutrophils Relative %: 47 %
Platelets: 137 10*3/uL — ABNORMAL LOW (ref 150–440)
RBC: 4.81 MIL/uL (ref 4.40–5.90)
RDW: 13.2 % (ref 11.5–14.5)
WBC: 5.6 10*3/uL (ref 3.8–10.6)

## 2015-12-25 LAB — COMPREHENSIVE METABOLIC PANEL
ALT: 19 U/L (ref 17–63)
AST: 20 U/L (ref 15–41)
Albumin: 4.2 g/dL (ref 3.5–5.0)
Alkaline Phosphatase: 39 U/L (ref 38–126)
Anion gap: 8 (ref 5–15)
BILIRUBIN TOTAL: 0.8 mg/dL (ref 0.3–1.2)
BUN: 24 mg/dL — AB (ref 6–20)
CO2: 26 mmol/L (ref 22–32)
CREATININE: 1.01 mg/dL (ref 0.61–1.24)
Calcium: 9.4 mg/dL (ref 8.9–10.3)
Chloride: 104 mmol/L (ref 101–111)
GFR calc Af Amer: >60 mL/min (ref >60)
GLUCOSE: 97 mg/dL (ref 65–99)
Potassium: 4.3 mmol/L (ref 3.5–5.1)
Sodium: 138 mmol/L (ref 135–145)
Total Protein: 7.2 g/dL (ref 6.5–8.1)

## 2015-12-25 NOTE — Progress Notes (Signed)
Location:      Place of Service:  SNF (31) Provider:  Toni Arthurs, NP-C  Steven [name]., MD  Patient Care Team: Steven Ruths, MD as PCP - General (Unknown Physician Specialty) Steven Cancel, MD as Consulting Physician (Orthopedic Surgery)  Extended Emergency Contact Information Primary Emergency Contact: Steven [name] Address: [address]          [place], [place] [address] [name] of [place] Phone: [phone] Mobile Phone: [phone] Relation: Sister Secondary Emergency Contact: Steven [name] Address: [address]          [place], [place] [address] Home Phone: [phone] Relation: None  Code Status:  Full  Goals of care: Advanced Directive information Advanced Directives 06/24/2012  Does patient have an advance directive? Patient does not have advance directive  Pre-existing out of facility DNR order (yellow form or pink MOST form) No     Chief Complaint  Patient presents with  . Medical Management of Chronic Issues    HPI:  Pt is a 65 y.o. male seen today for an acute visit for management of medications for chronic issues. He has a h/o Generalized Anxiety Disorder and Chronic Pain Syndrome. He has had multiple fractures of the hip and humerus. The anxiety is manifested by pt yelling out "damn, Damn" repeatedly throughout the day in a Tourette's - like fashion. This can be disturbing at times to roommates, other residents and families. Pt is minimally verbal. However, he does state that he is feeling fine. Denies pain, chest pain, dyspnea. Patient does appear to have slowly increasing confusion related to dementia. Therefore unable to obtain complete review of systems.   Past Medical History:  Diagnosis Date  . Alcoholic hepatitis 0539  . Anxiety   . Chronic pain   . Epistaxis 2001   required cauterization  . Fracture of left humerus   . GERD (gastroesophageal reflux disease)   . History of alcoholism   . Hypertonicity of bladder     . Lumbar spondylitis   . Stroke   . 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   . Thrombus of left atrial appendage 2003  . Urinary incontinence    Past Surgical History:  Procedure Laterality Date  . FEMUR IM NAIL Left 06/25/2012   Procedure: INTRAMEDULLARY (IM) NAIL FEMORAL;  Surgeon: Mauri Pole, MD;  Location: [facility];  Service: Orthopedics;  Laterality: Left;  . HAND SURGERY Bilateral 2003   crush injuries with multitrauma  . KNEE SURGERY  1996    Allergies  Allergen Reactions  . Cefazolin Rash  . Penicillins     REACTION: unknown allergy - hx since childhood      Medication List       Accurate as of 12/25/15  6:24 PM. Always use your most recent med list.          acetaminophen 500 MG tablet Commonly known as:  TYLENOL Take 2 tablets (1,000 mg total) by mouth 3 (three) times daily.   camphor-menthol lotion Commonly known as:  SARNA Apply topically as needed for itching.   cholecalciferol 1000 units tablet Commonly known as:  VITAMIN D Take 1,000 Units by mouth every morning.   clopidogrel 75 MG tablet Commonly known as:  PLAVIX Take 75 mg by mouth every morning.   enoxaparin 100 MG/ML injection Commonly known as:  LOVENOX Inject 1 mL (100 mg total) into the skin daily.   metoprolol 100 MG tablet Commonly known as:  LOPRESSOR Take 1 tablet (100 mg total)  by mouth 2 (two) times daily.   metoprolol succinate 100 MG 24 hr tablet Commonly known as:  TOPROL-XL Take 100 mg by mouth daily. Take with or immediately following a meal.   NON FORMULARY Apply 1 mL topically every 6 (six) hours.   omeprazole 20 MG capsule Commonly known as:  PRILOSEC Take 40 mg by mouth every morning.   ondansetron 4 MG tablet Commonly known as:  ZOFRAN Take 1 tablet (4 mg total) by mouth every 6 (six) hours as needed for nausea.   OVER THE COUNTER MEDICATION   oxyCODONE 5 MG immediate release tablet Commonly known as:  Oxy IR/ROXICODONE Take 1-2 tablets (5-10 mg  total) by mouth every 4 (four) hours as needed.   potassium chloride SA 20 MEQ tablet Commonly known as:  K-DUR,KLOR-CON Take 1 tablet (20 mEq total) by mouth daily.   psyllium 95 % Pack Commonly known as:  HYDROCIL/METAMUCIL Take 1 packet by mouth 2 (two) times daily.   saccharomyces boulardii 250 MG capsule Commonly known as:  FLORASTOR Take 1 capsule (250 mg total) by mouth 2 (two) times daily.   tamsulosin 0.4 MG Caps capsule Commonly known as:  FLOMAX Take 0.4 mg by mouth at bedtime.       Review of Systems  Unable to perform ROS: Dementia  Constitutional: Negative for appetite change, chills and fever.  Respiratory: Negative for apnea, cough, choking, chest tightness, shortness of breath and wheezing.   Cardiovascular: Negative for chest pain, palpitations and leg swelling.  Gastrointestinal: Negative for abdominal distention and abdominal pain.  Genitourinary: Negative for difficulty urinating, dysuria, frequency and urgency.  Musculoskeletal: Positive for arthralgias (typical arthritis). Negative for back pain, gait problem and myalgias.  Skin: Negative for color change, pallor, rash and wound.  Neurological: Negative for dizziness, tremors, syncope, speech difficulty, weakness, numbness and headaches.  Psychiatric/Behavioral: Negative for agitation and behavioral problems.  All other systems reviewed and are negative.   Immunization History  Administered Date(s) Administered  . Influenza Whole 01/02/2010  . Td 01/10/2010   Pertinent  Health Maintenance Due  Topic Date Due  . COLONOSCOPY  08/23/2000  . PNA vac Low Risk Adult (1 of 2 - PCV13) 08/24/2015  . INFLUENZA VACCINE  10/09/2015   Fall Risk  07/27/2012  Falls in the past year? No   Functional Status Survey:    Vitals:   12/25/15 1819  BP: 94/61  Pulse: 77  Resp: 18  Temp: 97.8 F (36.6 C)  SpO2: 92%  Weight: 143 lb (64.9 kg)   Body mass index is 26.32 kg/m. Physical Exam  Constitutional:  He is oriented to person, place, and time. Vital signs are normal. He appears well-developed and well-nourished. He is active and cooperative. He does not appear ill. No distress.  HENT:  Head: Normocephalic and atraumatic.  Mouth/Throat: Uvula is midline, oropharynx is clear and moist and mucous membranes are normal. Mucous membranes are not pale, not dry and not cyanotic.  Eyes: Conjunctivae, EOM and lids are normal. Pupils are equal, round, and reactive to light.  Neck: Trachea normal, normal range of motion and full passive range of motion without pain. Neck supple. No JVD present. No tracheal deviation, no edema and no erythema present. No thyromegaly present.  Cardiovascular: Normal rate, intact distal pulses and normal pulses.  An irregular rhythm present. Exam reveals distant heart sounds. Exam reveals no gallop and no friction rub.   No murmur heard. Pulmonary/Chest: Effort normal and breath sounds normal. No accessory muscle  usage. No respiratory distress. He has no wheezes. He has no rhonchi. He has no rales. He exhibits no tenderness.  Abdominal: Normal appearance and bowel sounds are normal. He exhibits no distension and no ascites. There is no tenderness.  Musculoskeletal: Normal range of motion. He exhibits no edema or tenderness.  Expected osteoarthritis, stiffness  Neurological: He is alert and oriented to person, place, and time. He has normal strength. He displays no tremor. He displays no seizure activity. Coordination and gait abnormal.  Skin: Skin is warm, dry and intact. He is not diaphoretic. No cyanosis. No pallor. Nails show no clubbing.  Psychiatric: He has a normal mood and affect. His speech is normal and behavior is normal. Judgment and thought content normal. Cognition and memory are normal.  Nursing note and vitals reviewed.   Labs reviewed:  Recent Labs  04/03/15 1047 12/25/15 0659  NA 139 138  K 4.5 4.3  CL 104 104  CO2 28 26  GLUCOSE 94 97  BUN 24* 24*    CREATININE 0.81 1.01  CALCIUM 9.8 9.4  MG 2.0  --     Recent Labs  04/03/15 1047 12/25/15 0659  AST 13* 20  ALT 13* 19  ALKPHOS 60 39  BILITOT 0.5 0.8  PROT 7.9 7.2  ALBUMIN 4.3 4.2    Recent Labs  04/03/15 1047 12/25/15 0659  WBC 7.2 5.6  NEUTROABS 4.5 2.7  HGB 14.4 14.3  HCT 44.1 42.6  MCV 88.2 88.4  PLT 169 137*   Lab Results  Component Value Date   TSH 1.941 04/03/2015   No results found for: HGBA1C Lab Results  Component Value Date   CHOL 86 07/12/2012   HDL 51 01/08/2010   LDLCALC 126 (H) 01/08/2010   TRIG 88 07/12/2012   CHOLHDL 3.8 Ratio 01/08/2010    Significant Diagnostic Results in last 30 days:  No results found.  Assessment/Plan 1. Generalized anxiety disorder  Clonidine 0.1 mg tablets-1/2 tablet (0.05 mg) daily for anxiety d/o, tourette's behaviors  Decrease metoprolol to 25 mg bid  Continue lorazepam 0.5 mg bid and 1 mg IM prn  Continue sertraline 150 mg po q day  Change Lamictal 250 mg by mouth 3 times a day  2. Chronic Pain Syndrome  Continue gabapentin 100 mg to tid  Continue scheduled tylenol  Continue scheduled tramadol  Family/ staff Communication:   Total Time:   Documentation:   Face to Face:   Family/Phone:   Labs/tests ordered:  CBC, met C  Medication list reviewed and assessed for continued appropriateness.  Steven Ports, NP-C Geriatrics [facility] [phone], [place] [address] Cell Phone (Mon-Fri 8am-5pm):  [phone] On Call:  [phone] & follow prompts after 5pm & weekends Office Phone:  [phone] Office Fax:  [phone]

## 2015-12-26 LAB — VITAMIN D 25 HYDROXY (VIT D DEFICIENCY, FRACTURES): Vit D, 25-Hydroxy: 27 ng/mL — ABNORMAL LOW (ref 30.0–100.0)

## 2016-01-09 ENCOUNTER — Encounter
Admission: RE | Admit: 2016-01-09 | Discharge: 2016-01-09 | Disposition: A | Discharge status: Home / Self Care | Payer: Medicare Other | Source: Ambulatory Visit | Attending: Internal Medicine | Admitting: Internal Medicine

## 2016-01-09 DIAGNOSIS — R319 Hematuria, unspecified: Secondary | ICD-10-CM | POA: Insufficient documentation

## 2016-01-20 DIAGNOSIS — R319 Hematuria, unspecified: Secondary | ICD-10-CM | POA: Diagnosis present

## 2016-01-20 LAB — URINALYSIS COMPLETE WITH MICROSCOPIC (ARMC ONLY)
BILIRUBIN URINE: NEGATIVE
Glucose, UA: NEGATIVE mg/dL
KETONES UR: NEGATIVE mg/dL
NITRITE: NEGATIVE
PH: 6 (ref 5.0–8.0)
Protein, ur: 100 mg/dL — AB
SPECIFIC GRAVITY, URINE: 1.026 (ref 1.005–1.030)

## 2016-01-23 LAB — URINE CULTURE

## 2016-02-08 ENCOUNTER — Encounter
Admission: RE | Admit: 2016-02-08 | Discharge: 2016-02-08 | Disposition: A | Discharge status: Home / Self Care | Payer: Medicare Other | Source: Ambulatory Visit | Attending: Internal Medicine | Admitting: Internal Medicine

## 2016-03-10 ENCOUNTER — Encounter
Admission: RE | Admit: 2016-03-10 | Discharge: 2016-03-10 | Disposition: A | Discharge status: Home / Self Care | Payer: Medicare Other | Source: Ambulatory Visit | Attending: Internal Medicine | Admitting: Internal Medicine

## 2016-04-10 ENCOUNTER — Encounter
Admission: RE | Admit: 2016-04-10 | Discharge: 2016-04-10 | Disposition: A | Discharge status: Home / Self Care | Payer: Medicare Other | Source: Ambulatory Visit | Attending: Internal Medicine | Admitting: Internal Medicine

## 2016-05-01 ENCOUNTER — Non-Acute Institutional Stay (SKILLED_NURSING_FACILITY): Payer: Medicare Other | Admitting: Gerontology

## 2016-05-01 DIAGNOSIS — G894 Chronic pain syndrome: Secondary | ICD-10-CM | POA: Diagnosis not present

## 2016-05-01 DIAGNOSIS — F1027 Alcohol dependence with alcohol-induced persisting dementia: Secondary | ICD-10-CM

## 2016-05-01 DIAGNOSIS — F1097 Alcohol use, unspecified with alcohol-induced persisting dementia: Secondary | ICD-10-CM | POA: Diagnosis not present

## 2016-05-01 NOTE — Progress Notes (Signed)
Location:      Place of Service:  SNF (31) Provider:  Lorenso Quarry, NP-C  Lauro Regulus., MD  Patient Care Team: Lauro Regulus, MD as PCP - General (Unknown Physician Specialty) Durene Romans, MD as Consulting Physician (Orthopedic Surgery)  Extended Emergency Contact Information Primary Emergency Contact: Tula Nakayama Address: [address]          [place], [place] [address] Darden Amber of [place] Home Phone: [phone] Mobile Phone: [phone] Relation: Sister Secondary Emergency Contact: Livingston Diones Address: [address]          [place], [place] [address] Home Phone: [phone] Relation: None  Code Status:  full Goals of care: Advanced Directive information Advanced Directives 06/24/2012  Does Patient Have a Medical Advance Directive? Patient does not have advance directive  Pre-existing out of facility DNR order (yellow form or pink MOST form) No     Chief Complaint  Patient presents with  . Medicare Wellness    HPI:  Pt is a 66 y.o. male seen today for medical management of chronic diseases. Mr Eslinger was evaluated of his chronic pain and dementia. Pt denies pain at this time. Medication regimen is effective. No changes to be made at this time. Dementia is stable. Pt is yelling out less. He appears to be more calm and relaxed on a daily basis. He spends a lot of the day out in the common areas and appears content. No noted behavioral outbursts lately. Pt is unable to give a comprehensive ROS d/t dementia. VSS. No other complaints.    Past Medical History:  Diagnosis Date  . Alcoholic hepatitis 2001  . Anxiety   . Chronic pain   . Epistaxis 2001   required cauterization  . Fracture of left humerus   . GERD (gastroesophageal reflux disease)   . History of alcoholism   . Hypertonicity of bladder   . Lumbar spondylitis   . Stroke   . 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   . Thrombus of left atrial appendage 2003  . Urinary  incontinence    Past Surgical History:  Procedure Laterality Date  . FEMUR IM NAIL Left 06/25/2012   Procedure: INTRAMEDULLARY (IM) NAIL FEMORAL;  Surgeon: Shelda Pal, MD;  Location: [facility];  Service: Orthopedics;  Laterality: Left;  . HAND SURGERY Bilateral 2003   crush injuries with multitrauma  . KNEE SURGERY  1996    Allergies  Allergen Reactions  . Cefazolin Rash  . Penicillins     REACTION: unknown allergy - hx since childhood    Allergies as of 05/01/2016      Reactions   Cefazolin Rash   Penicillins    REACTION: unknown allergy - hx since childhood      Medication List       Accurate as of 05/01/16 11:41 AM. Always use your most recent med list.          acetaminophen 500 MG tablet Commonly known as:  TYLENOL Take 2 tablets (1,000 mg total) by mouth 3 (three) times daily.   camphor-menthol lotion Commonly known as:  SARNA Apply topically as needed for itching.   cholecalciferol 1000 units tablet Commonly known as:  VITAMIN D Take 1,000 Units by mouth every morning.   clopidogrel 75 MG tablet Commonly known as:  PLAVIX Take 75 mg by mouth every morning.   enoxaparin 100 MG/ML injection Commonly known as:  LOVENOX Inject 1 mL (100 mg total) into the skin daily.   metoprolol 100  MG tablet Commonly known as:  LOPRESSOR Take 1 tablet (100 mg total) by mouth 2 (two) times daily.   metoprolol succinate 100 MG 24 hr tablet Commonly known as:  TOPROL-XL Take 100 mg by mouth daily. Take with or immediately following a meal.   NON FORMULARY Apply 1 mL topically every 6 (six) hours.   omeprazole 20 MG capsule Commonly known as:  PRILOSEC Take 40 mg by mouth every morning.   ondansetron 4 MG tablet Commonly known as:  ZOFRAN Take 1 tablet (4 mg total) by mouth every 6 (six) hours as needed for nausea.   OVER THE COUNTER MEDICATION   oxyCODONE 5 MG immediate release tablet Commonly known as:  Oxy IR/ROXICODONE Take 1-2 tablets (5-10 mg total) by  mouth every 4 (four) hours as needed.   potassium chloride SA 20 MEQ tablet Commonly known as:  K-DUR,KLOR-CON Take 1 tablet (20 mEq total) by mouth daily.   psyllium 95 % Pack Commonly known as:  HYDROCIL/METAMUCIL Take 1 packet by mouth 2 (two) times daily.   saccharomyces boulardii 250 MG capsule Commonly known as:  FLORASTOR Take 1 capsule (250 mg total) by mouth 2 (two) times daily.   tamsulosin 0.4 MG Caps capsule Commonly known as:  FLOMAX Take 0.4 mg by mouth at bedtime.       Review of Systems  Unable to perform ROS: Dementia  Constitutional: Negative for activity change, appetite change, chills and fever.  Respiratory: Negative for apnea, cough, choking, chest tightness, shortness of breath and wheezing.   Cardiovascular: Negative for chest pain, palpitations and leg swelling.  Gastrointestinal: Negative for abdominal distention and abdominal pain.  Genitourinary: Negative for difficulty urinating, dysuria, frequency and urgency.  Musculoskeletal: Positive for arthralgias (typical arthritis). Negative for back pain, gait problem and myalgias.  Skin: Negative for color change, pallor, rash and wound.  Neurological: Negative for dizziness, tremors, syncope, speech difficulty, weakness, numbness and headaches.  Psychiatric/Behavioral: Negative for agitation and behavioral problems.  All other systems reviewed and are negative.   Immunization History  Administered Date(s) Administered  . Influenza Whole 01/02/2010  . Td 01/10/2010   Pertinent  Health Maintenance Due  Topic Date Due  . COLONOSCOPY  08/23/2000  . PNA vac Low Risk Adult (1 of 2 - PCV13) 08/24/2015  . INFLUENZA VACCINE  10/09/2015   Fall Risk  07/27/2012  Falls in the past year? No   Functional Status Survey:    Vitals:   04/23/16 1020  BP: 109/70  Pulse: 70   There is no height or weight on file to calculate BMI. Physical Exam  Constitutional: He is oriented to person, place, and time.  Vital signs are normal. He appears well-developed and well-nourished. He is active and cooperative. He does not appear ill. No distress.  HENT:  Head: Normocephalic and atraumatic.  Mouth/Throat: Uvula is midline, oropharynx is clear and moist and mucous membranes are normal. Mucous membranes are not pale, not dry and not cyanotic.  Eyes: Conjunctivae, EOM and lids are normal. Pupils are equal, round, and reactive to light.  Neck: Trachea normal, normal range of motion and full passive range of motion without pain. Neck supple. No JVD present. No tracheal deviation, no edema and no erythema present. No thyromegaly present.  Cardiovascular: Normal rate, intact distal pulses and normal pulses.  An irregular rhythm present. Exam reveals distant heart sounds. Exam reveals no gallop and no friction rub.   No murmur heard. Pulmonary/Chest: Effort normal and breath sounds normal. No  accessory muscle usage. No respiratory distress. He has no wheezes. He has no rhonchi. He has no rales. He exhibits no tenderness.  Abdominal: Normal appearance and bowel sounds are normal. He exhibits no distension and no ascites. There is no tenderness.  Musculoskeletal: Normal range of motion. He exhibits no edema or tenderness.  Expected osteoarthritis, stiffness  Neurological: He is alert and oriented to person, place, and time. He has normal strength. He displays no tremor. He displays no seizure activity. Coordination and gait abnormal.  Skin: Skin is warm, dry and intact. He is not diaphoretic. No cyanosis. No pallor. Nails show no clubbing.  Psychiatric: He has a normal mood and affect. His speech is normal and behavior is normal. Judgment and thought content normal. Cognition and memory are normal.  Nursing note and vitals reviewed.   Labs reviewed:  Recent Labs  12/25/15 0659  NA 138  K 4.3  CL 104  CO2 26  GLUCOSE 97  BUN 24*  CREATININE 1.01  CALCIUM 9.4    Recent Labs  12/25/15 0659  AST 20    ALT 19  ALKPHOS 39  BILITOT 0.8  PROT 7.2  ALBUMIN 4.2    Recent Labs  12/25/15 0659  WBC 5.6  NEUTROABS 2.7  HGB 14.3  HCT 42.6  MCV 88.4  PLT 137*   Lab Results  Component Value Date   TSH 1.941 04/03/2015   No results found for: HGBA1C Lab Results  Component Value Date   CHOL 86 07/12/2012   HDL 51 01/08/2010   LDLCALC 126 (H) 01/08/2010   TRIG 88 07/12/2012   CHOLHDL 3.8 Ratio 01/08/2010    Significant Diagnostic Results in last 30 days:  No results found.  Assessment/Plan 1. Chronic pain syndrome  Continue Tylenol 100 mg po TID  Continue Tramadol 50 mg po BID- hold for sedation- Rx renewal written- # 60, no refill  Continue Gabapentin 100 mg po TID for leg pain  Baclofen 10 mg po TID for leg cramps, spasms  2. 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 Monthly medication orders reviewed and signed.  Brynda Rim, NP-C Geriatrics [facility] [phone], [place] [address] Cell Phone (Mon-Fri 8am-5pm):  [phone] On Call:  [phone] & follow prompts after 5pm & weekends Office Phone:  [phone] Office Fax:  [phone]

## 2016-05-08 ENCOUNTER — Encounter
Admission: RE | Admit: 2016-05-08 | Discharge: 2016-05-08 | Disposition: A | Discharge status: Home / Self Care | Payer: Medicare Other | Source: Ambulatory Visit | Attending: Internal Medicine | Admitting: Internal Medicine

## 2016-06-04 ENCOUNTER — Other Ambulatory Visit
Admission: RE | Admit: 2016-06-04 | Discharge: 2016-06-04 | Disposition: A | Discharge status: Home / Self Care | Payer: Medicare Other | Source: Skilled Nursing Facility | Attending: Internal Medicine | Admitting: Internal Medicine

## 2016-06-04 DIAGNOSIS — B964 Proteus (mirabilis) (morganii) as the cause of diseases classified elsewhere: Secondary | ICD-10-CM | POA: Insufficient documentation

## 2016-06-04 DIAGNOSIS — R8299 Other abnormal findings in urine: Secondary | ICD-10-CM | POA: Diagnosis present

## 2016-06-04 DIAGNOSIS — R300 Dysuria: Secondary | ICD-10-CM | POA: Diagnosis present

## 2016-06-04 LAB — URINALYSIS, COMPLETE (UACMP) WITH MICROSCOPIC
Bilirubin Urine: NEGATIVE
Glucose, UA: NEGATIVE mg/dL
Ketones, ur: NEGATIVE mg/dL
Nitrite: NEGATIVE
Protein, ur: 100 mg/dL — AB
Specific Gravity, Urine: 1.026 (ref 1.005–1.030)
pH: 7 (ref 5.0–8.0)

## 2016-06-05 ENCOUNTER — Non-Acute Institutional Stay (SKILLED_NURSING_FACILITY): Payer: Medicare Other | Admitting: Gerontology

## 2016-06-05 DIAGNOSIS — N3000 Acute cystitis without hematuria: Secondary | ICD-10-CM | POA: Diagnosis not present

## 2016-06-06 LAB — URINE CULTURE: Culture: >=100000 — AB

## 2016-06-08 ENCOUNTER — Encounter
Admission: RE | Admit: 2016-06-08 | Discharge: 2016-06-08 | Disposition: A | Discharge status: Home / Self Care | Payer: Medicare Other | Source: Ambulatory Visit | Attending: Internal Medicine | Admitting: Internal Medicine

## 2016-06-17 NOTE — Progress Notes (Signed)
Location:      Place of Service:  SNF (31) Provider:  Lorenso Quarry, NP-C  Lauro Regulus., MD  Patient Care Team: Lauro Regulus, MD as PCP - General (Unknown Physician Specialty) Durene Romans, MD as Consulting Physician (Orthopedic Surgery)  Extended Emergency Contact Information Primary Emergency Contact: Tula Nakayama Address: [address]          [place], [place] [address] Darden Amber of [place] Home Phone: [phone] Mobile Phone: [phone] Relation: Sister Secondary Emergency Contact: Livingston Diones Address: [address]          [place], [place] [address] Home Phone: [phone] Relation: None  Code Status:  Full  Goals of care: Advanced Directive information Advanced Directives 06/24/2012  Does Patient Have a Medical Advance Directive? Patient does not have advance directive  Pre-existing out of facility DNR order (yellow form or pink MOST form) No     Chief Complaint  Patient presents with  . Acute Visit    HPI:  Pt is a 66 y.o. male seen today for an acute visit for symptoms of UTI. Staff noted cloudy, malodorous urine that was blood tinged. Pt has also had increased agitation. Staff obtained a urine sample and sent for analysis. Please note, pt is minimally verbal and unable to give accurate ROS. Staff report pt has been afebrile. VSS. No other complaints.    Past Medical History:  Diagnosis Date  . Alcoholic hepatitis 2001  . Anxiety   . Chronic pain   . Epistaxis 2001   required cauterization  . Fracture of left humerus   . GERD (gastroesophageal reflux disease)   . History of alcoholism   . Hypertonicity of bladder   . Lumbar spondylitis   . Stroke   . 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   . Thrombus of left atrial appendage 2003  . Urinary incontinence    Past Surgical History:  Procedure Laterality Date  . FEMUR IM NAIL Left 06/25/2012   Procedure: INTRAMEDULLARY (IM) NAIL FEMORAL;  Surgeon: Shelda Pal, MD;   Location: [facility];  Service: Orthopedics;  Laterality: Left;  . HAND SURGERY Bilateral 2003   crush injuries with multitrauma  . KNEE SURGERY  1996    Allergies  Allergen Reactions  . Cefazolin Rash  . Penicillins     REACTION: unknown allergy - hx since childhood    Allergies as of 06/05/2016      Reactions   Cefazolin Rash   Penicillins    REACTION: unknown allergy - hx since childhood      Medication List       Accurate as of 06/05/16 11:59 PM. Always use your most recent med list.          acetaminophen 500 MG tablet Commonly known as:  TYLENOL Take 2 tablets (1,000 mg total) by mouth 3 (three) times daily.   camphor-menthol lotion Commonly known as:  SARNA Apply topically as needed for itching.   cholecalciferol 1000 units tablet Commonly known as:  VITAMIN D Take 1,000 Units by mouth every morning.   clopidogrel 75 MG tablet Commonly known as:  PLAVIX Take 75 mg by mouth every morning.   enoxaparin 100 MG/ML injection Commonly known as:  LOVENOX Inject 1 mL (100 mg total) into the skin daily.   metoprolol 100 MG tablet Commonly known as:  LOPRESSOR Take 1 tablet (100 mg total) by mouth 2 (two) times daily.   metoprolol succinate 100 MG 24 hr tablet Commonly known as:  TOPROL-XL  Take 100 mg by mouth daily. Take with or immediately following a meal.   NON FORMULARY Apply 1 mL topically every 6 (six) hours.   omeprazole 20 MG capsule Commonly known as:  PRILOSEC Take 40 mg by mouth every morning.   ondansetron 4 MG tablet Commonly known as:  ZOFRAN Take 1 tablet (4 mg total) by mouth every 6 (six) hours as needed for nausea.   OVER THE COUNTER MEDICATION   oxyCODONE 5 MG immediate release tablet Commonly known as:  Oxy IR/ROXICODONE Take 1-2 tablets (5-10 mg total) by mouth every 4 (four) hours as needed.   potassium chloride SA 20 MEQ tablet Commonly known as:  K-DUR,KLOR-CON Take 1 tablet (20 mEq total) by mouth daily.   psyllium 95 %  Pack Commonly known as:  HYDROCIL/METAMUCIL Take 1 packet by mouth 2 (two) times daily.   saccharomyces boulardii 250 MG capsule Commonly known as:  FLORASTOR Take 1 capsule (250 mg total) by mouth 2 (two) times daily.   tamsulosin 0.4 MG Caps capsule Commonly known as:  FLOMAX Take 0.4 mg by mouth at bedtime.       Review of Systems  Unable to perform ROS: Dementia  Constitutional: Negative for activity change, appetite change, chills and fever.  Respiratory: Negative.   Cardiovascular: Negative.   Gastrointestinal: Negative for abdominal distention and abdominal pain.  Genitourinary: Positive for hematuria. Negative for difficulty urinating, dysuria, frequency and urgency.  Musculoskeletal: Positive for arthralgias (typical arthritis). Negative for back pain, gait problem and myalgias.  Skin: Negative for color change, pallor, rash and wound.  Neurological: Negative for dizziness, tremors, syncope, speech difficulty, weakness, numbness and headaches.  Psychiatric/Behavioral: Positive for agitation and confusion. Negative for behavioral problems.  All other systems reviewed and are negative.   Immunization History  Administered Date(s) Administered  . Influenza Whole 01/02/2010  . Td 01/10/2010   Pertinent  Health Maintenance Due  Topic Date Due  . COLONOSCOPY  08/23/2000  . PNA vac Low Risk Adult (1 of 2 - PCV13) 08/24/2015  . INFLUENZA VACCINE  10/08/2016   Fall Risk  07/27/2012  Falls in the past year? No   Functional Status Survey:    Vitals:   06/04/16 0700  BP: 124/68  Pulse: 80   There is no height or weight on file to calculate BMI. Physical Exam  Constitutional: He is oriented to person, place, and time. Vital signs are normal. He appears well-developed and well-nourished. He is active and cooperative. He does not appear ill. No distress.  HENT:  Head: Normocephalic and atraumatic.  Mouth/Throat: Uvula is midline, oropharynx is clear and moist and  mucous membranes are normal. Mucous membranes are not pale, not dry and not cyanotic.  Eyes: Conjunctivae, EOM and lids are normal. Pupils are equal, round, and reactive to light.  Neck: Trachea normal, normal range of motion and full passive range of motion without pain. Neck supple. No JVD present. No tracheal deviation, no edema and no erythema present. No thyromegaly present.  Cardiovascular: Normal rate, intact distal pulses and normal pulses.  An irregular rhythm present. Exam reveals distant heart sounds. Exam reveals no gallop and no friction rub.   No murmur heard. Pulmonary/Chest: Effort normal and breath sounds normal. No accessory muscle usage. No respiratory distress. He has no wheezes. He has no rhonchi. He has no rales. He exhibits no tenderness.  Abdominal: Normal appearance and bowel sounds are normal. He exhibits no distension and no ascites. There is no tenderness.  Musculoskeletal:  Normal range of motion. He exhibits no edema or tenderness.  Expected osteoarthritis, stiffness  Neurological: He is alert and oriented to person, place, and time. He has normal strength. He displays no tremor. He displays no seizure activity. Coordination and gait abnormal.  Skin: Skin is warm, dry and intact. No rash noted. He is not diaphoretic. No cyanosis or erythema. No pallor. Nails show no clubbing.  Psychiatric: He has a normal mood and affect. His speech is normal. Thought content normal. He is agitated. Cognition and memory are impaired. He expresses impulsivity. He exhibits abnormal recent memory.  Nursing note and vitals reviewed.   Labs reviewed:  Recent Labs  12/25/15 0659  NA 138  K 4.3  CL 104  CO2 26  GLUCOSE 97  BUN 24*  CREATININE 1.01  CALCIUM 9.4    Recent Labs  12/25/15 0659  AST 20  ALT 19  ALKPHOS 39  BILITOT 0.8  PROT 7.2  ALBUMIN 4.2    Recent Labs  12/25/15 0659  WBC 5.6  NEUTROABS 2.7  HGB 14.3  HCT 42.6  MCV 88.4  PLT 137*   Lab Results    Component Value Date   TSH 1.941 04/03/2015   No results found for: HGBA1C Lab Results  Component Value Date   CHOL 86 07/12/2012   HDL 51 01/08/2010   LDLCALC 126 (H) 01/08/2010   TRIG 88 07/12/2012   CHOLHDL 3.8 Ratio 01/08/2010    Significant Diagnostic Results in last 30 days:  No results found.  Assessment/Plan 1. Acute cystitis without hematuria  Bactrim DS 1 tablet PO Q 12 hours x 14 days for Proteus Miribilis  Encourage PO Fluid intake  Family/ staff Communication:  Total Time:  Documentation:  Face to Face:  Family/Phone:   Labs/tests ordered:  UA, C&S  Medication list reviewed and assessed for continued appropriateness.  Brynda Rim, NP-C Geriatrics [facility] [phone] [address], [place] [address] Cell Phone (Mon-Fri 8am-5pm):  [phone] On Call:  [phone] & follow prompts after 5pm & weekends Office Phone:  [phone] Office Fax:  [phone]

## 2016-06-27 ENCOUNTER — Non-Acute Institutional Stay (SKILLED_NURSING_FACILITY): Payer: Medicare Other | Admitting: Gerontology

## 2016-06-27 DIAGNOSIS — F1097 Alcohol use, unspecified with alcohol-induced persisting dementia: Secondary | ICD-10-CM | POA: Diagnosis not present

## 2016-06-27 DIAGNOSIS — M810 Age-related osteoporosis without current pathological fracture: Secondary | ICD-10-CM

## 2016-06-27 DIAGNOSIS — D735 Infarction of spleen: Secondary | ICD-10-CM | POA: Diagnosis not present

## 2016-06-27 DIAGNOSIS — F0151 Vascular dementia with behavioral disturbance: Secondary | ICD-10-CM

## 2016-06-27 DIAGNOSIS — Z9181 History of falling: Secondary | ICD-10-CM

## 2016-06-27 DIAGNOSIS — F39 Unspecified mood [affective] disorder: Secondary | ICD-10-CM

## 2016-06-27 DIAGNOSIS — F01518 Vascular dementia, unspecified severity, with other behavioral disturbance: Secondary | ICD-10-CM

## 2016-06-27 DIAGNOSIS — R000 Tachycardia, unspecified: Secondary | ICD-10-CM | POA: Diagnosis not present

## 2016-06-27 DIAGNOSIS — K219 Gastro-esophageal reflux disease without esophagitis: Secondary | ICD-10-CM

## 2016-06-27 DIAGNOSIS — F419 Anxiety disorder, unspecified: Secondary | ICD-10-CM

## 2016-06-27 DIAGNOSIS — E538 Deficiency of other specified B group vitamins: Secondary | ICD-10-CM

## 2016-06-27 DIAGNOSIS — G8929 Other chronic pain: Secondary | ICD-10-CM | POA: Diagnosis not present

## 2016-06-27 DIAGNOSIS — G894 Chronic pain syndrome: Secondary | ICD-10-CM

## 2016-06-27 DIAGNOSIS — K861 Other chronic pancreatitis: Secondary | ICD-10-CM

## 2016-06-27 DIAGNOSIS — N401 Enlarged prostate with lower urinary tract symptoms: Secondary | ICD-10-CM

## 2016-06-27 DIAGNOSIS — N3281 Overactive bladder: Secondary | ICD-10-CM | POA: Diagnosis not present

## 2016-06-27 DIAGNOSIS — F3340 Major depressive disorder, recurrent, in remission, unspecified: Secondary | ICD-10-CM

## 2016-06-27 DIAGNOSIS — R32 Unspecified urinary incontinence: Secondary | ICD-10-CM

## 2016-06-27 DIAGNOSIS — R350 Frequency of micturition: Secondary | ICD-10-CM

## 2016-06-27 DIAGNOSIS — I5020 Unspecified systolic (congestive) heart failure: Secondary | ICD-10-CM

## 2016-07-08 ENCOUNTER — Other Ambulatory Visit
Admission: RE | Admit: 2016-07-08 | Discharge: 2016-07-08 | Disposition: A | Discharge status: Home / Self Care | Payer: Medicare Other | Source: Ambulatory Visit | Attending: Gerontology | Admitting: Gerontology

## 2016-07-08 ENCOUNTER — Encounter
Admission: RE | Admit: 2016-07-08 | Discharge: 2016-07-08 | Disposition: A | Discharge status: Home / Self Care | Payer: Medicare Other | Source: Ambulatory Visit | Attending: Internal Medicine | Admitting: Internal Medicine

## 2016-07-08 DIAGNOSIS — I5020 Unspecified systolic (congestive) heart failure: Secondary | ICD-10-CM | POA: Insufficient documentation

## 2016-07-08 LAB — COMPREHENSIVE METABOLIC PANEL
ALT: 18 U/L (ref 17–63)
AST: 17 U/L (ref 15–41)
Albumin: 4.8 g/dL (ref 3.5–5.0)
Alkaline Phosphatase: 44 U/L (ref 38–126)
Anion gap: 6 (ref 5–15)
BUN: 25 mg/dL — ABNORMAL HIGH (ref 6–20)
CO2: 29 mmol/L (ref 22–32)
Calcium: 9.6 mg/dL (ref 8.9–10.3)
Chloride: 103 mmol/L (ref 101–111)
Creatinine, Ser: 0.94 mg/dL (ref 0.61–1.24)
GFR calc Af Amer: >60 mL/min (ref >60)
GFR calc non Af Amer: >60 mL/min (ref >60)
Glucose, Bld: 86 mg/dL (ref 65–99)
Potassium: 4.3 mmol/L (ref 3.5–5.1)
Sodium: 138 mmol/L (ref 135–145)
Total Bilirubin: 0.7 mg/dL (ref 0.3–1.2)
Total Protein: 8 g/dL (ref 6.5–8.1)

## 2016-07-08 LAB — CBC WITH DIFFERENTIAL/PLATELET
BASOS PCT: 0 %
Basophils Absolute: 0 10*3/uL (ref 0–0.1)
Eosinophils Absolute: 0.1 10*3/uL (ref 0–0.7)
Eosinophils Relative: 2 %
HCT: 43 % (ref 40.0–52.0)
Hemoglobin: 14 g/dL (ref 13.0–18.0)
LYMPHS ABS: 1.6 10*3/uL (ref 1.0–3.6)
Lymphocytes Relative: 31 %
MCH: 28.8 pg (ref 26.0–34.0)
MCHC: 32.6 g/dL (ref 32.0–36.0)
MCV: 88.4 fL (ref 80.0–100.0)
MONOS PCT: 8 %
Monocytes Absolute: 0.4 10*3/uL (ref 0.2–1.0)
NEUTROS ABS: 3.1 10*3/uL (ref 1.4–6.5)
Neutrophils Relative %: 59 %
Platelets: 141 10*3/uL — ABNORMAL LOW (ref 150–440)
RBC: 4.87 MIL/uL (ref 4.40–5.90)
RDW: 13.8 % (ref 11.5–14.5)
WBC: 5.3 10*3/uL (ref 3.8–10.6)

## 2016-07-08 LAB — TSH: TSH: 3.307 u[IU]/mL (ref 0.350–4.500)

## 2016-07-08 LAB — MAGNESIUM: Magnesium: 2 mg/dL (ref 1.7–2.4)

## 2016-07-08 LAB — VITAMIN B12: Vitamin B-12: 1370 pg/mL — ABNORMAL HIGH (ref 180–914)

## 2016-07-09 LAB — VITAMIN D 25 HYDROXY (VIT D DEFICIENCY, FRACTURES): Vit D, 25-Hydroxy: 31.5 ng/mL (ref 30.0–100.0)

## 2016-07-21 DIAGNOSIS — D735 Infarction of spleen: Secondary | ICD-10-CM | POA: Insufficient documentation

## 2016-07-21 DIAGNOSIS — F01518 Vascular dementia, unspecified severity, with other behavioral disturbance: Secondary | ICD-10-CM | POA: Insufficient documentation

## 2016-07-21 DIAGNOSIS — N3281 Overactive bladder: Secondary | ICD-10-CM | POA: Insufficient documentation

## 2016-07-21 DIAGNOSIS — K861 Other chronic pancreatitis: Secondary | ICD-10-CM | POA: Insufficient documentation

## 2016-07-21 DIAGNOSIS — F39 Unspecified mood [affective] disorder: Secondary | ICD-10-CM | POA: Insufficient documentation

## 2016-07-21 DIAGNOSIS — I5020 Unspecified systolic (congestive) heart failure: Secondary | ICD-10-CM | POA: Insufficient documentation

## 2016-07-21 DIAGNOSIS — F3340 Major depressive disorder, recurrent, in remission, unspecified: Secondary | ICD-10-CM | POA: Insufficient documentation

## 2016-07-21 DIAGNOSIS — G8929 Other chronic pain: Secondary | ICD-10-CM | POA: Insufficient documentation

## 2016-07-21 DIAGNOSIS — R52 Pain, unspecified: Secondary | ICD-10-CM

## 2016-07-21 DIAGNOSIS — F0151 Vascular dementia with behavioral disturbance: Secondary | ICD-10-CM | POA: Insufficient documentation

## 2016-07-21 DIAGNOSIS — Z9181 History of falling: Secondary | ICD-10-CM | POA: Insufficient documentation

## 2016-07-21 DIAGNOSIS — R000 Tachycardia, unspecified: Secondary | ICD-10-CM | POA: Insufficient documentation

## 2016-07-21 DIAGNOSIS — M810 Age-related osteoporosis without current pathological fracture: Secondary | ICD-10-CM | POA: Insufficient documentation

## 2016-07-22 DIAGNOSIS — N400 Enlarged prostate without lower urinary tract symptoms: Secondary | ICD-10-CM | POA: Insufficient documentation

## 2016-07-22 DIAGNOSIS — E538 Deficiency of other specified B group vitamins: Secondary | ICD-10-CM | POA: Insufficient documentation

## 2016-07-22 NOTE — Progress Notes (Signed)
Location:      Place of Service:  SNF (31) Provider:  Toni Arthurs, NP-C  Kirk Ruths, MD  Patient Care Team: Kirk Ruths, MD as PCP - General (Unknown Physician Specialty) Paralee Cancel, MD as Consulting Physician (Orthopedic Surgery)  Extended Emergency Contact Information Primary Emergency Contact: Suzan Garibaldi Address: [address]          [place], [place] [address] [name] of [place] Phone: [phone] Mobile Phone: [phone] Relation: Sister Secondary Emergency Contact: Marcell Barlow Address: [address]          [place], [place] [address] Home Phone: [phone] Relation: None  Code Status:  Full Goals of care: Advanced Directive information Advanced Directives 06/24/2012  Does Patient Have a Medical Advance Directive? Patient does not have advance directive  Pre-existing out of facility DNR order (yellow form or pink MOST form) No     Chief Complaint  Patient presents with  . Medical Management of Chronic Issues    HPI:  Pt is a 66 y.o. male seen today for medical management of chronic diseases. Patient has been stable over the last month. Patient has not had any reported falls. He did have a UTI from Proteus Mirabella's this past month that was sensitive to Bactrim. Patient completed full course without complications. Patient is incontinent of bowel and bladder, wears adult briefs. Patient is not able to consistently verbalize his needs accurately. [place] If he is in need of something or in need to go to the bathroom, he typically will just yell out damn damn to get attention. Otherwise, patient reports he is feeling fine. Vital signs have been stable. No new complaints.   Past Medical History:  Diagnosis Date  . Alcoholic hepatitis 8099  . Anxiety   . Chronic pain   . Epistaxis 2001   required cauterization  . Fracture of left humerus   . GERD (gastroesophageal reflux disease)     . History of alcoholism   . Hypertonicity of bladder   . Lumbar spondylitis   . Stroke   . 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   . Thrombus of left atrial appendage 2003  . Urinary incontinence    Past Surgical History:  Procedure Laterality Date  . FEMUR IM NAIL Left 06/25/2012   Procedure: INTRAMEDULLARY (IM) NAIL FEMORAL;  Surgeon: Mauri Pole, MD;  Location: [facility];  Service: Orthopedics;  Laterality: Left;  . HAND SURGERY Bilateral 2003   crush injuries with multitrauma  . KNEE SURGERY  1996    Allergies  Allergen Reactions  . Cefazolin Rash  . Penicillins     REACTION: unknown allergy - hx since childhood    Allergies as of 06/27/2016      Reactions   Cefazolin Rash   Penicillins    REACTION: unknown allergy - hx since childhood      Medication List       Accurate as of 06/27/16 11:59 PM. Always use your most recent med list.          acetaminophen 500 MG tablet Commonly known as:  TYLENOL Take 2 tablets (1,000 mg total) by mouth 3 (three) times daily.   camphor-menthol lotion Commonly known as:  SARNA Apply topically as needed for itching.   cholecalciferol 1000 units tablet Commonly known as:  VITAMIN D Take 1,000 Units by mouth every morning.   clopidogrel 75 MG tablet Commonly known as:  PLAVIX Take 75  mg by mouth every morning.   enoxaparin 100 MG/ML injection Commonly known as:  LOVENOX Inject 1 mL (100 mg total) into the skin daily.   metoprolol succinate 100 MG 24 hr tablet Commonly known as:  TOPROL-XL Take 100 mg by mouth daily. Take with or immediately following a meal.   metoprolol tartrate 100 MG tablet Commonly known as:  LOPRESSOR Take 1 tablet (100 mg total) by mouth 2 (two) times daily.   NON FORMULARY Apply 1 mL topically every 6 (six) hours.   omeprazole 20 MG capsule Commonly known as:  PRILOSEC Take 40 mg by mouth every morning.   ondansetron 4 MG tablet Commonly known as:  ZOFRAN Take 1 tablet (4 mg total)  by mouth every 6 (six) hours as needed for nausea.   OVER THE COUNTER MEDICATION   oxyCODONE 5 MG immediate release tablet Commonly known as:  Oxy IR/ROXICODONE Take 1-2 tablets (5-10 mg total) by mouth every 4 (four) hours as needed.   potassium chloride SA 20 MEQ tablet Commonly known as:  K-DUR,KLOR-CON Take 1 tablet (20 mEq total) by mouth daily.   psyllium 95 % Pack Commonly known as:  HYDROCIL/METAMUCIL Take 1 packet by mouth 2 (two) times daily.   saccharomyces boulardii 250 MG capsule Commonly known as:  FLORASTOR Take 1 capsule (250 mg total) by mouth 2 (two) times daily.   tamsulosin 0.4 MG Caps capsule Commonly known as:  FLOMAX Take 0.4 mg by mouth at bedtime.       Review of Systems  Unable to perform ROS: Dementia  Constitutional: Negative for activity change, appetite change, chills and fever.  HENT: Negative.   Respiratory: Negative.   Cardiovascular: Negative.   Gastrointestinal: Negative for abdominal distention and abdominal pain.  Genitourinary: Negative for difficulty urinating, dysuria, frequency, hematuria and urgency.  Musculoskeletal: Positive for arthralgias (typical arthritis). Negative for back pain, gait problem and myalgias.  Skin: Negative for color change, pallor, rash and wound.  Neurological: Negative for dizziness, tremors, syncope, speech difficulty, weakness, numbness and headaches.  Psychiatric/Behavioral: Negative for agitation, behavioral problems and confusion.  All other systems reviewed and are negative.   Immunization History  Administered Date(s) Administered  . Influenza Whole 01/02/2010  . Td 01/10/2010   Pertinent  Health Maintenance Due  Topic Date Due  . COLONOSCOPY  08/23/2000  . PNA vac Low Risk Adult (1 of 2 - PCV13) 08/24/2015  . INFLUENZA VACCINE  10/08/2016   Fall Risk  07/27/2012  Falls in the past year? No   Functional Status Survey:    Vitals:   06/25/16 0650  BP: (!) 109/53  Pulse: 74   There is  no height or weight on file to calculate BMI. Physical Exam  Constitutional: He is oriented to person, place, and time. Vital signs are normal. He appears well-developed and well-nourished. He is active and cooperative. He does not appear ill. No distress.  HENT:  Head: Normocephalic and atraumatic.  Mouth/Throat: Uvula is midline, oropharynx is clear and moist and mucous membranes are normal. Mucous membranes are not pale, not dry and not cyanotic.  Eyes: Conjunctivae, EOM and lids are normal. Pupils are equal, round, and reactive to light.  Neck: Trachea normal, normal range of motion and full passive range of motion without pain. Neck supple. No JVD present. No tracheal deviation, no edema and no erythema present. No thyromegaly present.  Cardiovascular: Normal rate, intact distal pulses and normal pulses.  An irregular rhythm present. Exam reveals distant heart sounds.  Exam reveals no gallop and no friction rub.   No murmur heard. Pulmonary/Chest: Effort normal and breath sounds normal. No accessory muscle usage. No respiratory distress. He has no wheezes. He has no rhonchi. He has no rales. He exhibits no tenderness.  Abdominal: Normal appearance and bowel sounds are normal. He exhibits no distension and no ascites. There is no tenderness.  Musculoskeletal: Normal range of motion. He exhibits no edema or tenderness.  Expected osteoarthritis, stiffness  Neurological: He is alert and oriented to person, place, and time. He has normal strength. He displays no tremor. He displays no seizure activity. Coordination and gait abnormal.  Skin: Skin is warm, dry and intact. No rash noted. He is not diaphoretic. No cyanosis or erythema. No pallor. Nails show no clubbing.  Psychiatric: He has a normal mood and affect. His speech is normal. Thought content normal. He is agitated. Cognition and memory are impaired. He expresses impulsivity. He exhibits abnormal recent memory.  Nursing note and vitals  reviewed.   Labs reviewed:  Recent Labs  12/25/15 0659 07/08/16 0809  NA 138 138  K 4.3 4.3  CL 104 103  CO2 26 29  GLUCOSE 97 86  BUN 24* 25*  CREATININE 1.01 0.94  CALCIUM 9.4 9.6  MG  --  2.0    Recent Labs  12/25/15 0659 07/08/16 0809  AST 20 17  ALT 19 18  ALKPHOS 39 44  BILITOT 0.8 0.7  PROT 7.2 8.0  ALBUMIN 4.2 4.8    Recent Labs  12/25/15 0659 07/08/16 0809  WBC 5.6 5.3  NEUTROABS 2.7 3.1  HGB 14.3 14.0  HCT 42.6 43.0  MCV 88.4 88.4  PLT 137* 141*   Lab Results  Component Value Date   TSH 3.307 07/08/2016   No results found for: HGBA1C Lab Results  Component Value Date   CHOL 86 07/12/2012   HDL 51 01/08/2010   LDLCALC 126 (H) 01/08/2010   TRIG 88 07/12/2012   CHOLHDL 3.8 Ratio 01/08/2010    Significant Diagnostic Results in last 30 days:  No results found.  Assessment/Plan 1. 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 Major depressive disorder, recurrent, in remission (HCC)  Stable  Continue sertraline 150 mg by mouth daily  3. Mood disorder (HCC)  Stable  Continue Lamictal 25 mg tablets 2 tablets 3 times a day for mood disorder  4. Vascular dementia with behavioral disturbance  Stable  Continue clonidine 0.1 mg one half tablet by mouth daily for verbal outbursts  5. Age-related osteoporosis without current pathological fracture  Stable  Continue cholecalciferol 1000 units 2 tablets by mouth daily  6. Hx of falling  Stable  7. Other chronic pancreatitis (HCC)  Stable  8. Infarction of spleen  Stable  9. Tachycardia, unspecified  Stable  Continue metoprolol 25 mg by mouth twice a day  10. Other chronic pain  Stable stable  Continue baclofen 10 mg by mouth 3 times a day for leg cramps and spasms  Continue tramadol 50 mg by mouth twice a day for breakthrough pain  Continue Tylenol 1000 mg by mouth 3 times a day  11. Systolic congestive heart failure, unspecified HF  chronicity (HCC)  Stable  Continue Plavix 75 mg by mouth daily  Continue potassium chloride 20 mEq by mouth daily  12. Overactive bladder  Stable  13. Gastroesophageal reflux disease without esophagitis  Stable  Continue Fiber-Lax 25 mg 2 tablets by mouth twice a day  Continue protonic 20  mg by mouth daily  Continue senna S1 tablet by mouth twice a day  14. Anxiety disorder, unspecified type  Stable  Continue lorazepam 0.5 mg one half tablet in the morning and one tablet by mouth daily at bedtime  15. Chronic pain syndrome  Stable  Continue gabapentin 100 mg by mouth 3 times a day  16. Urinary incontinence, unspecified type  Stable  Frequent monitoring to ensure dry brief  17. Hypomagnesemia  Stable  Continue Magnesium Oxide 250 mg po Q Day  18. Benign prostatic hyperplasia with urinary frequency  Stable  Continue Tamsulosin 0.4 mg po Q HS  19. Vitamin B12 deficiency  Stable  Decrease Cyanocobalamin to 500 mcg po Q Day  Continue Folic acid 128 mcg po Q Day   Family/ staff Communication:   Total Time:  Documentation:  Face to Face:  Family/Phone:   Labs/tests ordered:  Cbc, met c, D, B12, TSH  Medication list reviewed and assessed for continued appropriateness. Monthly medication orders reviewed and signed.  Vikki Ports, NP-C Geriatrics [facility] [phone], [place] [address] Cell Phone (Mon-Fri 8am-5pm):  [phone] On Call:  [phone] & follow prompts after 5pm & weekends Office Phone:  [phone] Office Fax:  [phone]

## 2016-08-06 ENCOUNTER — Non-Acute Institutional Stay (SKILLED_NURSING_FACILITY): Payer: Medicare Other | Admitting: Gerontology

## 2016-08-06 DIAGNOSIS — F01518 Vascular dementia, unspecified severity, with other behavioral disturbance: Secondary | ICD-10-CM

## 2016-08-06 DIAGNOSIS — R000 Tachycardia, unspecified: Secondary | ICD-10-CM | POA: Diagnosis not present

## 2016-08-06 DIAGNOSIS — F1097 Alcohol use, unspecified with alcohol-induced persisting dementia: Secondary | ICD-10-CM

## 2016-08-06 DIAGNOSIS — K861 Other chronic pancreatitis: Secondary | ICD-10-CM

## 2016-08-06 DIAGNOSIS — R350 Frequency of micturition: Secondary | ICD-10-CM

## 2016-08-06 DIAGNOSIS — F3340 Major depressive disorder, recurrent, in remission, unspecified: Secondary | ICD-10-CM

## 2016-08-06 DIAGNOSIS — I5020 Unspecified systolic (congestive) heart failure: Secondary | ICD-10-CM | POA: Diagnosis not present

## 2016-08-06 DIAGNOSIS — N401 Enlarged prostate with lower urinary tract symptoms: Secondary | ICD-10-CM

## 2016-08-06 DIAGNOSIS — F39 Unspecified mood [affective] disorder: Secondary | ICD-10-CM

## 2016-08-06 DIAGNOSIS — F0151 Vascular dementia with behavioral disturbance: Secondary | ICD-10-CM

## 2016-08-06 DIAGNOSIS — G8929 Other chronic pain: Secondary | ICD-10-CM

## 2016-08-06 DIAGNOSIS — Z9181 History of falling: Secondary | ICD-10-CM | POA: Diagnosis not present

## 2016-08-06 DIAGNOSIS — D735 Infarction of spleen: Secondary | ICD-10-CM | POA: Diagnosis not present

## 2016-08-06 DIAGNOSIS — M810 Age-related osteoporosis without current pathological fracture: Secondary | ICD-10-CM

## 2016-08-06 DIAGNOSIS — E538 Deficiency of other specified B group vitamins: Secondary | ICD-10-CM

## 2016-08-06 DIAGNOSIS — N3281 Overactive bladder: Secondary | ICD-10-CM

## 2016-08-06 DIAGNOSIS — K219 Gastro-esophageal reflux disease without esophagitis: Secondary | ICD-10-CM

## 2016-08-06 DIAGNOSIS — G894 Chronic pain syndrome: Secondary | ICD-10-CM

## 2016-08-06 DIAGNOSIS — R32 Unspecified urinary incontinence: Secondary | ICD-10-CM

## 2016-08-06 DIAGNOSIS — F419 Anxiety disorder, unspecified: Secondary | ICD-10-CM

## 2016-08-06 NOTE — Progress Notes (Signed)
Location:      Place of Service:  SNF (31) Provider:  Lorenso Quarry, NP-C  Lauro Regulus, MD  Patient Care Team: Lauro Regulus, MD as PCP - General (Unknown Physician Specialty) Durene Romans, MD as Consulting Physician (Orthopedic Surgery)  Extended Emergency Contact Information Primary Emergency Contact: Tula Nakayama Address: [address]          [place], [place] [address] Darden Amber of [place] Home Phone: [phone] Mobile Phone: [phone] Relation: Sister Secondary Emergency Contact: Livingston Diones Address: [address]          [place], [place] [address] Home Phone: [phone] Relation: None  Code Status:  Full Goals of care: Advanced Directive information Advanced Directives 06/24/2012  Does Patient Have a Medical Advance Directive? Patient does not have advance directive  Pre-existing out of facility DNR order (yellow form or pink MOST form) No     Chief Complaint  Patient presents with  . Medical Management of Chronic Issues    HPI:  Pt is a 66 y.o. male seen today for medical management of chronic diseases. Patient has been stable over the last month. Patient has not had any reported falls. Patient is incontinent of bowel and bladder, wears adult briefs. Patient is not able to consistently verbalize his needs accurately. [place] If he is in need of something or in need to go to the bathroom, he typically will just yell out damn damn to get attention. Otherwise, patient reports he is feeling fine. Vital signs have been stable. No new complaints.   Past Medical History:  Diagnosis Date  . Alcoholic hepatitis 2001  . Anxiety   . Chronic pain   . Epistaxis 2001   required cauterization  . Fracture of left humerus   . GERD (gastroesophageal reflux disease)   . History of alcoholism   . Hypertonicity of bladder   . Lumbar spondylitis   . Stroke   . 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   .  Thrombus of left atrial appendage 2003  . Urinary incontinence    Past Surgical History:  Procedure Laterality Date  . FEMUR IM NAIL Left 06/25/2012   Procedure: INTRAMEDULLARY (IM) NAIL FEMORAL;  Surgeon: Shelda Pal, MD;  Location: [facility];  Service: Orthopedics;  Laterality: Left;  . HAND SURGERY Bilateral 2003   crush injuries with multitrauma  . KNEE SURGERY  1996    Allergies  Allergen Reactions  . Cefazolin Rash  . Penicillins     REACTION: unknown allergy - hx since childhood    Allergies as of 08/06/2016      Reactions   Cefazolin Rash   Penicillins    REACTION: unknown allergy - hx since childhood      Medication List       Accurate as of 08/06/16  9:55 PM. Always use your most recent med list.          acetaminophen 500 MG tablet Commonly known as:  TYLENOL Take 2 tablets (1,000 mg total) by mouth 3 (three) times daily.   camphor-menthol lotion Commonly known as:  SARNA Apply topically as needed for itching.   cholecalciferol 1000 units tablet Commonly known as:  VITAMIN D Take 1,000 Units by mouth every morning.   clopidogrel 75 MG tablet Commonly known as:  PLAVIX Take 75 mg by mouth every morning.   enoxaparin 100 MG/ML injection Commonly known as:  LOVENOX Inject 1 mL (100 mg total)  into the skin daily.   metoprolol succinate 100 MG 24 hr tablet Commonly known as:  TOPROL-XL Take 100 mg by mouth daily. Take with or immediately following a meal.   metoprolol tartrate 100 MG tablet Commonly known as:  LOPRESSOR Take 1 tablet (100 mg total) by mouth 2 (two) times daily.   NON FORMULARY Apply 1 mL topically every 6 (six) hours.   omeprazole 20 MG capsule Commonly known as:  PRILOSEC Take 40 mg by mouth every morning.   ondansetron 4 MG tablet Commonly known as:  ZOFRAN Take 1 tablet (4 mg total) by mouth every 6 (six) hours as needed for nausea.   OVER THE COUNTER MEDICATION   oxyCODONE 5 MG immediate release tablet Commonly known  as:  Oxy IR/ROXICODONE Take 1-2 tablets (5-10 mg total) by mouth every 4 (four) hours as needed.   potassium chloride SA 20 MEQ tablet Commonly known as:  K-DUR,KLOR-CON Take 1 tablet (20 mEq total) by mouth daily.   psyllium 95 % Pack Commonly known as:  HYDROCIL/METAMUCIL Take 1 packet by mouth 2 (two) times daily.   saccharomyces boulardii 250 MG capsule Commonly known as:  FLORASTOR Take 1 capsule (250 mg total) by mouth 2 (two) times daily.   tamsulosin 0.4 MG Caps capsule Commonly known as:  FLOMAX Take 0.4 mg by mouth at bedtime.       Review of Systems  Unable to perform ROS: Dementia  Constitutional: Negative for activity change, appetite change, chills and fever.  HENT: Negative.   Respiratory: Negative.   Cardiovascular: Negative.   Gastrointestinal: Negative for abdominal distention and abdominal pain.  Genitourinary: Negative for difficulty urinating, dysuria, frequency, hematuria and urgency.  Musculoskeletal: Positive for arthralgias (typical arthritis). Negative for back pain, gait problem and myalgias.  Skin: Negative for color change, pallor, rash and wound.  Neurological: Negative for dizziness, tremors, syncope, speech difficulty, weakness, numbness and headaches.  Psychiatric/Behavioral: Negative for agitation, behavioral problems and confusion.  All other systems reviewed and are negative.   Immunization History  Administered Date(s) Administered  . Influenza Whole 01/02/2010  . Td 01/10/2010   Pertinent  Health Maintenance Due  Topic Date Due  . COLONOSCOPY  08/23/2000  . PNA vac Low Risk Adult (1 of 2 - PCV13) 08/24/2015  . INFLUENZA VACCINE  10/08/2016   Fall Risk  07/27/2012  Falls in the past year? No   Functional Status Survey:    Vitals:   08/06/16 0530  BP: 106/60  Pulse: 80   There is no height or weight on file to calculate BMI. Physical Exam  Constitutional: He is oriented to person, place, and time. Vital signs are normal.  He appears well-developed and well-nourished. He is active and cooperative. He does not appear ill. No distress.  HENT:  Head: Normocephalic and atraumatic.  Mouth/Throat: Uvula is midline, oropharynx is clear and moist and mucous membranes are normal. Mucous membranes are not pale, not dry and not cyanotic.  Eyes: Conjunctivae, EOM and lids are normal. Pupils are equal, round, and reactive to light.  Neck: Trachea normal, normal range of motion and full passive range of motion without pain. Neck supple. No JVD present. No tracheal deviation, no edema and no erythema present. No thyromegaly present.  Cardiovascular: Normal rate, intact distal pulses and normal pulses.  An irregular rhythm present. Exam reveals distant heart sounds. Exam reveals no gallop and no friction rub.   No murmur heard. Pulmonary/Chest: Effort normal and breath sounds normal. No accessory muscle  usage. No respiratory distress. He has no wheezes. He has no rhonchi. He has no rales. He exhibits no tenderness.  Abdominal: Normal appearance and bowel sounds are normal. He exhibits no distension and no ascites. There is no tenderness.  Musculoskeletal: Normal range of motion. He exhibits no edema or tenderness.  Expected osteoarthritis, stiffness  Neurological: He is alert and oriented to person, place, and time. He has normal strength. He displays no tremor. He displays no seizure activity. Coordination and gait abnormal.  Skin: Skin is warm, dry and intact. No rash noted. He is not diaphoretic. No cyanosis or erythema. No pallor. Nails show no clubbing.  Psychiatric: He has a normal mood and affect. His speech is normal. Thought content normal. He is agitated. Cognition and memory are impaired. He expresses impulsivity. He exhibits abnormal recent memory.  Nursing note and vitals reviewed.   Labs reviewed:  Recent Labs  12/25/15 0659 07/08/16 0809  NA 138 138  K 4.3 4.3  CL 104 103  CO2 26 29  GLUCOSE 97 86  BUN 24*  25*  CREATININE 1.01 0.94  CALCIUM 9.4 9.6  MG  --  2.0    Recent Labs  12/25/15 0659 07/08/16 0809  AST 20 17  ALT 19 18  ALKPHOS 39 44  BILITOT 0.8 0.7  PROT 7.2 8.0  ALBUMIN 4.2 4.8    Recent Labs  12/25/15 0659 07/08/16 0809  WBC 5.6 5.3  NEUTROABS 2.7 3.1  HGB 14.3 14.0  HCT 42.6 43.0  MCV 88.4 88.4  PLT 137* 141*   Lab Results  Component Value Date   TSH 3.307 07/08/2016   No results found for: HGBA1C Lab Results  Component Value Date   CHOL 86 07/12/2012   HDL 51 01/08/2010   LDLCALC 126 (H) 01/08/2010   TRIG 88 07/12/2012   CHOLHDL 3.8 Ratio 01/08/2010    Significant Diagnostic Results in last 30 days:  No results found.  Assessment/Plan 1. 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 Major depressive disorder, recurrent, in remission (HCC)  Stable  Continue sertraline 150 mg by mouth daily  3. Mood disorder (HCC)  Stable  Continue Lamictal 25 mg tablets 2 tablets 3 times a day for mood disorder  4. Vascular dementia with behavioral disturbance  Stable  Continue clonidine 0.1 mg one half tablet by mouth daily for verbal outbursts  5. Age-related osteoporosis without current pathological fracture  Stable  Continue cholecalciferol 1000 units 2 tablets by mouth daily  6. Hx of falling  Stable  7. Other chronic pancreatitis (HCC)  Stable  8. Infarction of spleen  Stable  9. Tachycardia, unspecified  Stable  Continue metoprolol 25 mg by mouth twice a day  10. Other chronic pain  Stable stable  Continue baclofen 10 mg by mouth 3 times a day for leg cramps and spasms  Continue tramadol 50 mg by mouth twice a day for breakthrough pain  Continue Tylenol 1000 mg by mouth 3 times a day  11. Systolic congestive heart failure, unspecified HF chronicity (HCC)  Stable  Continue Plavix 75 mg by mouth daily  Continue potassium chloride 20 mEq by mouth daily  12. Overactive  bladder  Stable  13. Gastroesophageal reflux disease without esophagitis  Stable  Continue Fiber-Lax 25 mg 2 tablets by mouth twice a day  Continue protonix 20 mg by mouth daily  Continue senna S 1 tablet by mouth twice a day  14. Anxiety disorder, unspecified type  Stable  Continue lorazepam 0.5 mg one half tablet in the morning and one tablet by mouth daily at bedtime  15. Chronic pain syndrome  Stable  Continue gabapentin 100 mg by mouth 3 times a day  16. Urinary incontinence, unspecified type  Stable  Frequent monitoring to ensure dry brief  17. Hypomagnesemia  Stable  Continue Magnesium Oxide 250 mg po Q Day  18. Benign prostatic hyperplasia with urinary frequency  Stable  Continue Tamsulosin 0.4 mg po Q HS  19. Vitamin B12 deficiency  Stable  Continue Cyanocobalamin to 500 mcg po Q Day  Continue Folic acid 400 mcg po Q Day   Family/ staff Communication:   Total Time:  Documentation:  Face to Face:  Family/Phone:   Labs/tests ordered:    Medication list reviewed and assessed for continued appropriateness. Monthly medication orders reviewed and signed.  Brynda Rim, NP-C Geriatrics [facility] [phone] [address], [place] [address] Cell Phone (Mon-Fri 8am-5pm):  [phone] On Call:  [phone] & follow prompts after 5pm & weekends Office Phone:  [phone] Office Fax:  [phone]

## 2016-08-08 ENCOUNTER — Encounter
Admission: RE | Admit: 2016-08-08 | Discharge: 2016-08-08 | Disposition: A | Discharge status: Home / Self Care | Payer: Medicare Other | Source: Ambulatory Visit | Attending: Internal Medicine | Admitting: Internal Medicine

## 2016-08-16 ENCOUNTER — Other Ambulatory Visit
Admission: RE | Admit: 2016-08-16 | Discharge: 2016-08-16 | Disposition: A | Discharge status: Home / Self Care | Payer: Medicare Other | Source: Skilled Nursing Facility | Attending: Internal Medicine | Admitting: Internal Medicine

## 2016-08-16 DIAGNOSIS — R309 Painful micturition, unspecified: Secondary | ICD-10-CM | POA: Diagnosis present

## 2016-08-16 LAB — URINALYSIS, COMPLETE (UACMP) WITH MICROSCOPIC
BILIRUBIN URINE: NEGATIVE
Glucose, UA: NEGATIVE mg/dL
Ketones, ur: NEGATIVE mg/dL
NITRITE: NEGATIVE
PROTEIN: 100 mg/dL — AB
SQUAMOUS EPITHELIAL / LPF: NONE SEEN
Specific Gravity, Urine: 1.026 (ref 1.005–1.030)
pH: 7 (ref 5.0–8.0)

## 2016-08-18 ENCOUNTER — Non-Acute Institutional Stay (SKILLED_NURSING_FACILITY): Payer: Medicare Other | Admitting: Gerontology

## 2016-08-18 DIAGNOSIS — N3001 Acute cystitis with hematuria: Secondary | ICD-10-CM

## 2016-08-18 LAB — URINE CULTURE: Culture: >=100000 — AB

## 2016-09-07 ENCOUNTER — Encounter
Admission: RE | Admit: 2016-09-07 | Discharge: 2016-09-07 | Disposition: A | Discharge status: Home / Self Care | Payer: Medicare Other | Source: Ambulatory Visit | Attending: Internal Medicine | Admitting: Internal Medicine

## 2016-09-07 NOTE — Progress Notes (Signed)
Location:      Place of Service:  SNF (31) Provider:  Lorenso Quarry, NP-C  Lauro Regulus, MD  Patient Care Team: Lauro Regulus, MD as PCP - General (Unknown Physician Specialty) Durene Romans, MD as Consulting Physician (Orthopedic Surgery)  Extended Emergency Contact Information Primary Emergency Contact: Tula Nakayama Address: [address]          [place], [place] [address] Darden Amber of [place] Home Phone: [phone] Mobile Phone: [phone] Relation: Sister  Code Status:  full Goals of care: Advanced Directive information Advanced Directives 06/24/2012  Does Patient Have a Medical Advance Directive? Patient does not have advance directive  Pre-existing out of facility DNR order (yellow form or pink MOST form) No     Chief Complaint  Patient presents with  . Acute Visit    HPI:  Pt is a 66 y.o. [name] seen today for an acute visit for UTI. Nursing noticed his urine was blood tinged and foul smelling. Pt c/o pain/burning with urination. He is incontinent of urine and wears a brief. He does have intermittent UTIs. Pt was initially started on Cipro, but abt was changed when culture/sensitivities showed Proteus Miribilis that was resistant to Cipro. Pt denies CV tenderness. Afebrile. VSS. No other complaints.    Past Medical History:  Diagnosis Date  . Alcoholic hepatitis 2001  . Anxiety   . Chronic pain   . Epistaxis 2001   required cauterization  . Fracture of left humerus   . GERD (gastroesophageal reflux disease)   . History of alcoholism   . Hypertonicity of bladder   . Lumbar spondylitis   . Stroke   . 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   . Thrombus of left atrial appendage 2003  . Urinary incontinence    Past Surgical History:  Procedure Laterality Date  . FEMUR IM NAIL Left 06/25/2012   Procedure: INTRAMEDULLARY (IM) NAIL FEMORAL;  Surgeon: Shelda Pal, MD;  Location: [facility];  Service: Orthopedics;  Laterality: Left;  . HAND  SURGERY Bilateral 2003   crush injuries with multitrauma  . KNEE SURGERY  1996    Allergies  Allergen Reactions  . Cefazolin Rash  . Penicillins     REACTION: unknown allergy - hx since childhood    Allergies as of 08/18/2016      Reactions   Cefazolin Rash   Penicillins    REACTION: unknown allergy - hx since childhood      Medication List       Accurate as of 08/18/16 11:59 PM. Always use your most recent med list.          acetaminophen 500 MG tablet Commonly known as:  TYLENOL Take 2 tablets (1,000 mg total) by mouth 3 (three) times daily.   camphor-menthol lotion Commonly known as:  SARNA Apply topically as needed for itching.   cholecalciferol 1000 units tablet Commonly known as:  VITAMIN D Take 1,000 Units by mouth every morning.   clopidogrel 75 MG tablet Commonly known as:  PLAVIX Take 75 mg by mouth every morning.   enoxaparin 100 MG/ML injection Commonly known as:  LOVENOX Inject 1 mL (100 mg total) into the skin daily.   metoprolol succinate 100 MG 24 hr tablet Commonly known as:  TOPROL-XL Take 100 mg by mouth daily. Take with or immediately following a meal.   metoprolol tartrate 100 MG tablet Commonly known as:  LOPRESSOR Take 1 tablet (100 mg total) by mouth 2 (two) times daily.  NON FORMULARY Apply 1 mL topically every 6 (six) hours.   omeprazole 20 MG capsule Commonly known as:  PRILOSEC Take 40 mg by mouth every morning.   ondansetron 4 MG tablet Commonly known as:  ZOFRAN Take 1 tablet (4 mg total) by mouth every 6 (six) hours as needed for nausea.   OVER THE COUNTER MEDICATION   oxyCODONE 5 MG immediate release tablet Commonly known as:  Oxy IR/ROXICODONE Take 1-2 tablets (5-10 mg total) by mouth every 4 (four) hours as needed.   potassium chloride SA 20 MEQ tablet Commonly known as:  K-DUR,KLOR-CON Take 1 tablet (20 mEq total) by mouth daily.   psyllium 95 % Pack Commonly known as:  HYDROCIL/METAMUCIL Take 1 packet by  mouth 2 (two) times daily.   saccharomyces boulardii 250 MG capsule Commonly known as:  FLORASTOR Take 1 capsule (250 mg total) by mouth 2 (two) times daily.   tamsulosin 0.4 MG Caps capsule Commonly known as:  FLOMAX Take 0.4 mg by mouth at bedtime.       Review of Systems  Unable to perform ROS: Dementia  Constitutional: Negative for activity change, appetite change, chills and fever.  HENT: Negative.   Respiratory: Negative.   Cardiovascular: Negative.   Gastrointestinal: Negative for abdominal distention and abdominal pain.  Genitourinary: Positive for dysuria and hematuria. Negative for flank pain, frequency and urgency.  Musculoskeletal: Positive for arthralgias (typical arthritis). Negative for back pain, gait problem and myalgias.  Skin: Negative for color change, pallor, rash and wound.  Neurological: Negative for dizziness, tremors, syncope, speech difficulty, weakness, numbness and headaches.  Psychiatric/Behavioral: Negative for agitation, behavioral problems and confusion.  All other systems reviewed and are negative.   Immunization History  Administered Date(s) Administered  . Influenza Whole 01/02/2010  . Td 01/10/2010   Pertinent  Health Maintenance Due  Topic Date Due  . COLONOSCOPY  08/23/2000  . PNA vac Low Risk Adult (1 of 2 - PCV13) 08/24/2015  . INFLUENZA VACCINE  10/08/2016   Fall Risk  07/27/2012  Falls in the past year? No   Functional Status Survey:    Vitals:   08/13/16 0700  BP: 110/60  Pulse: 80  Temp: (!) 96.2 F (35.7 C)  Weight: 152 lb 4.8 oz (69.1 kg)   Body mass index is 28.03 kg/m. Physical Exam  Constitutional: He is oriented to person, place, and time. Vital signs are normal. He appears well-developed and well-nourished. He is active and cooperative. He does not appear ill. No distress.  HENT:  Head: Normocephalic and atraumatic.  Mouth/Throat: Uvula is midline, oropharynx is clear and moist and mucous membranes are  normal. Mucous membranes are not pale, not dry and not cyanotic.  Eyes: Conjunctivae, EOM and lids are normal. Pupils are equal, round, and reactive to light.  Neck: Trachea normal, normal range of motion and full passive range of motion without pain. Neck supple. No JVD present. No tracheal deviation, no edema and no erythema present. No thyromegaly present.  Cardiovascular: Normal rate, intact distal pulses and normal pulses.  An irregular rhythm present. Exam reveals distant heart sounds. Exam reveals no gallop and no friction rub.   No murmur heard. Pulmonary/Chest: Effort normal and breath sounds normal. No accessory muscle usage. No respiratory distress. He has no decreased breath sounds. He has no wheezes. He has no rhonchi. He has no rales. He exhibits no tenderness.  Abdominal: Normal appearance and bowel sounds are normal. He exhibits no distension and no ascites. There is  no tenderness. There is no CVA tenderness.  Musculoskeletal: Normal range of motion. He exhibits no edema or tenderness.  Expected osteoarthritis, stiffness  Neurological: He is alert and oriented to person, place, and time. He has normal strength. He displays no tremor. He displays no seizure activity. Coordination and gait abnormal.  Skin: Skin is warm, dry and intact. No rash noted. He is not diaphoretic. No cyanosis or erythema. No pallor. Nails show no clubbing.  Psychiatric: He has a normal mood and affect. His speech is normal. Thought content normal. He is agitated. Cognition and memory are impaired. He expresses impulsivity. He exhibits abnormal recent memory.  Nursing note and vitals reviewed.   Labs reviewed:  Recent Labs  12/25/15 0659 07/08/16 0809  NA 138 138  K 4.3 4.3  CL 104 103  CO2 26 29  GLUCOSE 97 86  BUN 24* 25*  CREATININE 1.01 0.94  CALCIUM 9.4 9.6  MG  --  2.0    Recent Labs  12/25/15 0659 07/08/16 0809  AST 20 17  ALT 19 18  ALKPHOS 39 Steven  BILITOT 0.8 0.7  PROT 7.2 8.0    ALBUMIN 4.2 4.8    Recent Labs  12/25/15 0659 07/08/16 0809  WBC 5.6 5.3  NEUTROABS 2.7 3.1  HGB 14.3 14.0  HCT 42.6 43.0  MCV 88.4 88.4  PLT 137* 141*   Lab Results  Component Value Date   TSH 3.307 07/08/2016   No results found for: HGBA1C Lab Results  Component Value Date   CHOL 86 07/12/2012   HDL 51 01/08/2010   LDLCALC 126 (H) 01/08/2010   TRIG 88 07/12/2012   CHOLHDL 3.8 Ratio 01/08/2010    Significant Diagnostic Results in last 30 days:  No results found.  Assessment/Plan 1. Acute cystitis with hematuria  Bactrim DS 1 tablet po Q 12 hours x 14 days  Encourage po Fluid intake  Keep brief clean and dry  Family/ staff Communication:   Total Time:  Documentation:  Face to Face:  Family/Phone:   Labs/tests ordered:  UA, C&S  Medication list reviewed and assessed for continued appropriateness.  Brynda RimShannon H. Radha Coggins, NP-C Geriatrics [facility] [phone] [address] [place], [place] [id number] Cell Phone (Mon-Fri 8am-5pm):  [phone] On Call:  [phone] & follow prompts after 5pm & weekends Office Phone:  [phone] Office Fax:  [phone]

## 2016-10-01 ENCOUNTER — Encounter: Payer: Self-pay | Admitting: Gerontology

## 2016-10-01 ENCOUNTER — Non-Acute Institutional Stay (SKILLED_NURSING_FACILITY): Payer: Medicare Other | Admitting: Gerontology

## 2016-10-01 DIAGNOSIS — R000 Tachycardia, unspecified: Secondary | ICD-10-CM | POA: Diagnosis not present

## 2016-10-01 DIAGNOSIS — K861 Other chronic pancreatitis: Secondary | ICD-10-CM

## 2016-10-01 DIAGNOSIS — F3340 Major depressive disorder, recurrent, in remission, unspecified: Secondary | ICD-10-CM | POA: Diagnosis not present

## 2016-10-01 DIAGNOSIS — F1097 Alcohol use, unspecified with alcohol-induced persisting dementia: Secondary | ICD-10-CM | POA: Diagnosis not present

## 2016-10-01 DIAGNOSIS — Z9181 History of falling: Secondary | ICD-10-CM

## 2016-10-01 DIAGNOSIS — E538 Deficiency of other specified B group vitamins: Secondary | ICD-10-CM

## 2016-10-01 DIAGNOSIS — I5020 Unspecified systolic (congestive) heart failure: Secondary | ICD-10-CM | POA: Diagnosis not present

## 2016-10-01 DIAGNOSIS — F419 Anxiety disorder, unspecified: Secondary | ICD-10-CM

## 2016-10-01 DIAGNOSIS — F39 Unspecified mood [affective] disorder: Secondary | ICD-10-CM | POA: Diagnosis not present

## 2016-10-01 DIAGNOSIS — D735 Infarction of spleen: Secondary | ICD-10-CM

## 2016-10-01 DIAGNOSIS — M810 Age-related osteoporosis without current pathological fracture: Secondary | ICD-10-CM | POA: Diagnosis not present

## 2016-10-01 DIAGNOSIS — R350 Frequency of micturition: Secondary | ICD-10-CM

## 2016-10-01 DIAGNOSIS — G8929 Other chronic pain: Secondary | ICD-10-CM | POA: Diagnosis not present

## 2016-10-01 DIAGNOSIS — N3281 Overactive bladder: Secondary | ICD-10-CM | POA: Diagnosis not present

## 2016-10-01 DIAGNOSIS — F01518 Vascular dementia, unspecified severity, with other behavioral disturbance: Secondary | ICD-10-CM

## 2016-10-01 DIAGNOSIS — N401 Enlarged prostate with lower urinary tract symptoms: Secondary | ICD-10-CM

## 2016-10-01 DIAGNOSIS — F0151 Vascular dementia with behavioral disturbance: Secondary | ICD-10-CM | POA: Diagnosis not present

## 2016-10-01 DIAGNOSIS — G894 Chronic pain syndrome: Secondary | ICD-10-CM

## 2016-10-01 DIAGNOSIS — R32 Unspecified urinary incontinence: Secondary | ICD-10-CM

## 2016-10-01 DIAGNOSIS — K219 Gastro-esophageal reflux disease without esophagitis: Secondary | ICD-10-CM

## 2016-10-01 NOTE — Progress Notes (Signed)
[facility]:   [facility] Room Number: 323A Place of Service:  SNF [phone]) Provider:  Lorenso QuarryShannon Mayrin Schmuck, NP-C  Lauro RegulusAnderson, Marshall W, MD  Patient Care Team: Lauro RegulusAnderson, Marshall W, MD as PCP - General (Unknown Physician Specialty) Durene Romanslin, Matthew, MD as Consulting Physician (Orthopedic Surgery)  Extended Emergency Contact Information Primary Emergency Contact: Tula NakayamaSockwell,[name] Address: [address]          [place], [place] [address] [place] Home Phone: [phone] Mobile Phone: [phone] Relation: Sister  Code Status:  Full Goals of care: Advanced Directive information Advanced Directives 10/01/2016  Does Patient Have a Medical Advance Directive? No  Pre-existing out of facility DNR order (yellow form or pink MOST form) -     Chief Complaint  Patient presents with  . Medical Management of Chronic Issues    Routine Visit    HPI:  Pt is a 66 y.o. male seen today for medical management of chronic diseases. Patient has been stable over the last month. Patient has not had any reported falls. Patient is incontinent of bowel and bladder, wears adult briefs. Patient is not able to consistently verbalize his needs accurately. [place]. If he is in need of something or in need to go to the bathroom, he typically will just yell out "damn, damn" to get attention. This past month, pt did have a UTI d/t proteus mirabilis. Responded well to Bactrim- based on sensitivities. Otherwise, patient reports he is feeling fine. Vital signs have been stable. No new complaints.   Past Medical History:  Diagnosis Date  . Alcoholic hepatitis 2001  . Anxiety   . Chronic pain   . 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 Depression, major, in remission (HCC) 05/10/2015  . Epistaxis 2001   required cauterization  . Fracture of left humerus   . GERD (gastroesophageal reflux disease)   .  History of alcoholism (HCC)   . History of pancreatitis 05/10/2015  . Hypertonicity of bladder   . Lumbar spondylitis (HCC)   . Osteoporosis 05/10/2015  . Stroke (HCC)   . 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   . Thrombus of left atrial appendage 2003  . Urinary incontinence    Past Surgical History:  Procedure Laterality Date  . FEMUR IM NAIL Left 06/25/2012   Procedure: INTRAMEDULLARY (IM) NAIL FEMORAL;  Surgeon: Shelda PalMatthew D Olin, MD;  Location: [facility];  Service: Orthopedics;  Laterality: Left;  . HAND SURGERY Bilateral 2003   crush injuries with multitrauma  . KNEE SURGERY  1996    Allergies  Allergen Reactions  . Cefazolin Rash  . Penicillins     REACTION: unknown allergy - hx since childhood    Allergies as of 10/01/2016      Reactions   Cefazolin Rash   Penicillins    REACTION: unknown allergy - hx since childhood      Medication List       Accurate as of 10/01/16 10:14 AM. Always use your most recent med list.          acetaminophen 500 MG tablet Commonly known as:  TYLENOL Take 2 tablets (1,000 mg total) by mouth 3 (three) times daily.   baclofen 10 MG tablet Commonly known as:  LIORESAL Take 10 mg by mouth 3 (three) times daily.   cholecalciferol 1000 units tablet Commonly known as:  VITAMIN D Take 2,000 Units by mouth every morning. 2  tabs   cloNIDine 0.1 MG tablet Commonly known as:  CATAPRES Take 0.05 mg by mouth daily. 1/2 tablet   clopidogrel 75 MG tablet Commonly known as:  PLAVIX Take 75 mg by mouth every morning.   cyanocobalamin 500 MCG tablet Take 500 mcg by mouth daily.   folic acid 400 MCG tablet Commonly known as:  FOLVITE Take 400 mcg by mouth daily.   gabapentin 100 MG capsule Commonly known as:  NEURONTIN Take 100 mg by mouth 3 (three) times daily.   lamoTRIgine 25 MG tablet Commonly known as:  LAMICTAL Take 50 mg by mouth 3 (three) times daily. 2 tabs   LORazepam 0.5 MG tablet Commonly known as:  ATIVAN Give 1/2  tablet (0.25 mg) by mouth daily in the morning and 1 tablet (0.5 mg) by mouth at bedtime. Hold for sedation.   Magnesium 250 MG Tabs Take 1 tablet by mouth daily.   metoprolol succinate 25 MG 24 hr tablet Commonly known as:  TOPROL-XL Take 25 mg by mouth 2 (two) times daily.   pantoprazole 20 MG tablet Commonly known as:  PROTONIX Take 20 mg by mouth daily.   polycarbophil 625 MG tablet Commonly known as:  FIBERCON Take 1,250 mg by mouth 2 (two) times daily. Take with 8 oz of water   potassium chloride 10 MEQ tablet Commonly known as:  K-DUR,KLOR-CON Take 10 mEq by mouth daily. May open and sprinkle on food   sennosides-docusate sodium 8.6-50 MG tablet Commonly known as:  SENOKOT-S Take 1 tablet by mouth 2 (two) times daily.   sertraline 100 MG tablet Commonly known as:  ZOLOFT Take 150 mg by mouth daily. 1 and 1/2 tablet   tamsulosin 0.4 MG Caps capsule Commonly known as:  FLOMAX Take 0.4 mg by mouth at bedtime.   traMADol 50 MG tablet Commonly known as:  ULTRAM Take 50 mg by mouth 2 (two) times daily. Hold for sedation   traMADol 50 MG tablet Commonly known as:  ULTRAM Take 50 mg by mouth 2 (two) times daily as needed.       Review of Systems  Unable to perform ROS: Dementia  Constitutional: Negative for activity change, appetite change, chills and fever.  HENT: Negative.   Respiratory: Negative.   Cardiovascular: Negative.   Gastrointestinal: Negative for abdominal distention and abdominal pain.  Genitourinary: Negative for difficulty urinating, dysuria, frequency, hematuria and urgency.  Musculoskeletal: Positive for arthralgias (typical arthritis). Negative for back pain, gait problem and myalgias.  Skin: Negative for color change, pallor, rash and wound.  Neurological: Negative for dizziness, tremors, syncope, speech difficulty, weakness, numbness and headaches.  Psychiatric/Behavioral: Negative for agitation, behavioral problems and confusion.  All other  systems reviewed and are negative.   Immunization History  Administered Date(s) Administered  . Influenza Whole 01/02/2010  . Influenza-Unspecified 12/04/2013, 11/24/2014, 12/05/2015  . PPD Test 12/21/2015  . Pneumococcal-Unspecified 10/14/2012  . Td 01/10/2010   Pertinent  Health Maintenance Due  Topic Date Due  . COLONOSCOPY  08/23/2000  . PNA vac Low Risk Adult (1 of 2 - PCV13) 08/24/2015  . INFLUENZA VACCINE  10/08/2016   Fall Risk  07/27/2012  Falls in the past year? No   Functional Status Survey:    Vitals:   10/01/16 0900  BP: 136/66  Pulse: 80  Resp: (!) 22  Temp: 98.3 F (36.8 C)  SpO2: 94%  Weight: 155 lb 11.2 oz (70.6 kg)  Height: 5' 1.8" (1.57 m)   Body mass index is 28.66  kg/m. Physical Exam  Constitutional: He is oriented to person, place, and time. Vital signs are normal. He appears well-developed and well-nourished. He is active and cooperative. He does not appear ill. No distress.  HENT:  Head: Normocephalic and atraumatic.  Mouth/Throat: Uvula is midline, oropharynx is clear and moist and mucous membranes are normal. Mucous membranes are not pale, not dry and not cyanotic.  Eyes: Pupils are equal, round, and reactive to light. Conjunctivae, EOM and lids are normal.  Neck: Trachea normal, normal range of motion and full passive range of motion without pain. Neck supple. No JVD present. No tracheal deviation, no edema and no erythema present. No thyromegaly present.  Cardiovascular: Normal rate, intact distal pulses and normal pulses.  An irregular rhythm present. Exam reveals distant heart sounds. Exam reveals no gallop and no friction rub.   No murmur heard. Pulmonary/Chest: Effort normal and breath sounds normal. No accessory muscle usage. No respiratory distress. He has no wheezes. He has no rhonchi. He has no rales. He exhibits no tenderness.  Abdominal: Normal appearance and bowel sounds are normal. He exhibits no distension and no ascites. There is  no tenderness.  Musculoskeletal: Normal range of motion. He exhibits no edema or tenderness.  Expected osteoarthritis, stiffness  Neurological: He is alert and oriented to person, place, and time. He has normal strength. He displays no tremor. He displays no seizure activity. Coordination and gait abnormal.  Skin: Skin is warm, dry and intact. No rash noted. He is not diaphoretic. No cyanosis or erythema. No pallor. Nails show no clubbing.  Psychiatric: He has a normal mood and affect. His speech is normal. Thought content normal. He is agitated. Cognition and memory are impaired. He expresses impulsivity. He exhibits abnormal recent memory.  Nursing note and vitals reviewed.   Labs reviewed:  Recent Labs  12/25/15 0659 07/08/16 0809  NA 138 138  K 4.3 4.3  CL 104 103  CO2 26 29  GLUCOSE 97 86  BUN 24* 25*  CREATININE 1.01 0.94  CALCIUM 9.4 9.6  MG  --  2.0    Recent Labs  12/25/15 0659 07/08/16 0809  AST 20 17  ALT 19 18  ALKPHOS 39 44  BILITOT 0.8 0.7  PROT 7.2 8.0  ALBUMIN 4.2 4.8    Recent Labs  12/25/15 0659 07/08/16 0809  WBC 5.6 5.3  NEUTROABS 2.7 3.1  HGB 14.3 14.0  HCT 42.6 43.0  MCV 88.4 88.4  PLT 137* 141*   Lab Results  Component Value Date   TSH 3.307 07/08/2016   No results found for: HGBA1C Lab Results  Component Value Date   CHOL 86 07/12/2012   HDL 51 01/08/2010   LDLCALC 126 (H) 01/08/2010   TRIG 88 07/12/2012   CHOLHDL 3.8 Ratio 01/08/2010    Significant Diagnostic Results in last 30 days:  No results found.  Assessment/Plan 1. 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 Major depressive disorder, recurrent, in remission (HCC)  Stable  Continue sertraline 150 mg by mouth daily  3. Mood disorder (HCC)  Stable  Continue Lamictal 25 mg tablets 2 tablets 3 times a day for mood disorder  4. Vascular dementia with behavioral disturbance  Stable  Continue clonidine 0.1 mg one half tablet by  mouth daily for verbal outbursts  5. Age-related osteoporosis without current pathological fracture  Stable  Continue cholecalciferol 1000 units 2 tablets by mouth daily  6. Hx of falling  Stable  7. Other chronic  pancreatitis (HCC)  Stable  8. Infarction of spleen  Stable  9. Tachycardia, unspecified  Stable  Continue metoprolol 25 mg by mouth twice a day  10. Other chronic pain  Stable stable  Continue baclofen 10 mg by mouth 3 times a day for leg cramps and spasms  Continue tramadol 50 mg by mouth twice a day for breakthrough pain  Continue Tylenol 1000 mg by mouth 3 times a day  11. Systolic congestive heart failure, unspecified HF chronicity (HCC)  Stable  Continue Plavix 75 mg by mouth daily  Continue potassium chloride 20 mEq by mouth daily  12. Overactive bladder  Stable  13. Gastroesophageal reflux disease without esophagitis  Stable  Continue Fiber-Lax 25 mg 2 tablets by mouth twice a day  Continue protonix 20 mg by mouth daily  Continue senna S 1 tablet by mouth twice a day  14. Anxiety disorder, unspecified type  Stable  Continue lorazepam 0.5 mg one half tablet in the morning and one tablet by mouth daily at bedtime  15. Chronic pain syndrome  Stable  Continue gabapentin 100 mg by mouth 3 times a day  16. Urinary incontinence, unspecified type  Stable  Frequent monitoring to ensure dry brief  17. Hypomagnesemia  Stable  Continue Magnesium Oxide 250 mg po Q Day  18. Benign prostatic hyperplasia with urinary frequency  Stable  Continue Tamsulosin 0.4 mg po Q HS  19. Vitamin B12 deficiency  Stable  Continue Cyanocobalamin to 500 mcg po Q Day  Continue Folic acid 400 mcg po Q Day   Family/ staff Communication:   Total Time:  Documentation:  Face to Face:  Family/Phone:   Labs/tests ordered:    Medication list reviewed and assessed for continued appropriateness. Monthly medication orders reviewed and  signed.  Brynda Rim, NP-C Geriatrics [facility] [phone] [address], [place] [address] Cell Phone (Mon-Fri 8am-5pm):  [phone] On Call:  [phone] & follow prompts after 5pm & weekends Office Phone:  [phone] Office Fax:  [phone]

## 2016-10-08 ENCOUNTER — Encounter
Admission: RE | Admit: 2016-10-08 | Discharge: 2016-10-08 | Disposition: A | Discharge status: Home / Self Care | Payer: Medicare Other | Source: Ambulatory Visit | Attending: Internal Medicine | Admitting: Internal Medicine

## 2016-10-13 ENCOUNTER — Other Ambulatory Visit: Payer: Self-pay

## 2016-10-13 MED ORDER — TRAMADOL HCL 50 MG PO TABS: 50.0000 mg | ORAL_TABLET | Freq: Two times a day (BID) | ORAL | 4 refills | Status: DC

## 2016-10-13 NOTE — Telephone Encounter (Signed)
Rx sent to [facility] phone : [phone] , fax : [phone]  

## 2016-11-03 ENCOUNTER — Other Ambulatory Visit: Payer: Self-pay

## 2016-11-03 MED ORDER — LORAZEPAM 0.5 MG PO TABS: ORAL_TABLET | ORAL | 5 refills | Status: DC

## 2016-11-03 NOTE — Telephone Encounter (Signed)
Rx sent to [facility] phone : [phone] , fax : [phone]  

## 2016-11-05 ENCOUNTER — Non-Acute Institutional Stay (SKILLED_NURSING_FACILITY): Payer: Medicare Other | Admitting: Gerontology

## 2016-11-05 ENCOUNTER — Encounter: Payer: Self-pay | Admitting: Gerontology

## 2016-11-05 DIAGNOSIS — Z9181 History of falling: Secondary | ICD-10-CM

## 2016-11-05 DIAGNOSIS — M810 Age-related osteoporosis without current pathological fracture: Secondary | ICD-10-CM | POA: Diagnosis not present

## 2016-11-05 DIAGNOSIS — G894 Chronic pain syndrome: Secondary | ICD-10-CM

## 2016-11-05 DIAGNOSIS — F419 Anxiety disorder, unspecified: Secondary | ICD-10-CM

## 2016-11-05 DIAGNOSIS — R350 Frequency of micturition: Secondary | ICD-10-CM

## 2016-11-05 DIAGNOSIS — I5020 Unspecified systolic (congestive) heart failure: Secondary | ICD-10-CM

## 2016-11-05 DIAGNOSIS — F39 Unspecified mood [affective] disorder: Secondary | ICD-10-CM | POA: Diagnosis not present

## 2016-11-05 DIAGNOSIS — D735 Infarction of spleen: Secondary | ICD-10-CM | POA: Diagnosis not present

## 2016-11-05 DIAGNOSIS — N3281 Overactive bladder: Secondary | ICD-10-CM | POA: Diagnosis not present

## 2016-11-05 DIAGNOSIS — K861 Other chronic pancreatitis: Secondary | ICD-10-CM | POA: Diagnosis not present

## 2016-11-05 DIAGNOSIS — R000 Tachycardia, unspecified: Secondary | ICD-10-CM | POA: Diagnosis not present

## 2016-11-05 DIAGNOSIS — F01518 Vascular dementia, unspecified severity, with other behavioral disturbance: Secondary | ICD-10-CM

## 2016-11-05 DIAGNOSIS — N401 Enlarged prostate with lower urinary tract symptoms: Secondary | ICD-10-CM

## 2016-11-05 DIAGNOSIS — F3340 Major depressive disorder, recurrent, in remission, unspecified: Secondary | ICD-10-CM | POA: Diagnosis not present

## 2016-11-05 DIAGNOSIS — R32 Unspecified urinary incontinence: Secondary | ICD-10-CM

## 2016-11-05 DIAGNOSIS — G8929 Other chronic pain: Secondary | ICD-10-CM

## 2016-11-05 DIAGNOSIS — F1097 Alcohol use, unspecified with alcohol-induced persisting dementia: Secondary | ICD-10-CM

## 2016-11-05 DIAGNOSIS — F0151 Vascular dementia with behavioral disturbance: Secondary | ICD-10-CM

## 2016-11-05 DIAGNOSIS — E538 Deficiency of other specified B group vitamins: Secondary | ICD-10-CM

## 2016-11-05 DIAGNOSIS — K219 Gastro-esophageal reflux disease without esophagitis: Secondary | ICD-10-CM

## 2016-11-05 NOTE — Progress Notes (Signed)
Location:   [facility] Room Number: 323A Place of Service:  SNF [phone]) Provider:  Lorenso Quarry, NP-C  Lauro Regulus, MD  Patient Care Team: Lauro Regulus, MD as PCP - General (Unknown Physician Specialty) Durene Romans, MD as Consulting Physician (Orthopedic Surgery)  Extended Emergency Contact Information Primary Emergency Contact: Tula Nakayama Address: [address]          [place], [place] [address] [place] Home Phone: [phone] Mobile Phone: [phone] Relation: Sister  Code Status:  Full Goals of care: Advanced Directive information Advanced Directives 11/05/2016  Does Patient Have a Medical Advance Directive? No  Pre-existing out of facility DNR order (yellow form or pink MOST form) -     Chief Complaint  Patient presents with  . Medical Management of Chronic Issues    Routine Visit    HPI:  Pt is a 66 y.o. male seen today for medical management of chronic diseases. Patient has been stable over the last month. Patient has not had any reported falls. Patient is incontinent of bowel and bladder, wears adult briefs. Patient is not able to consistently verbalize his needs accurately. [place] If he is in need of something or in need to go to the bathroom, he typically will just yell out "damn, damn" to get attention. Otherwise, patient reports he is feeling fine. Vital signs have been stable. No new complaints.   Past Medical History:  Diagnosis Date  . Alcoholic hepatitis 2001  . Anxiety   . Chronic pain   . 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 Depression, major, in remission (HCC) 05/10/2015  . Epistaxis 2001   required cauterization  . Fracture of left humerus   . GERD (gastroesophageal reflux disease)   . History of alcoholism (HCC)   . History of pancreatitis 05/10/2015  . Hypertonicity of bladder   . Lumbar  spondylitis (HCC)   . Osteoporosis 05/10/2015  . Stroke (HCC)   . 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   . Thrombus of left atrial appendage 2003  . Urinary incontinence    Past Surgical History:  Procedure Laterality Date  . FEMUR IM NAIL Left 06/25/2012   Procedure: INTRAMEDULLARY (IM) NAIL FEMORAL;  Surgeon: Shelda Pal, MD;  Location: [facility];  Service: Orthopedics;  Laterality: Left;  . HAND SURGERY Bilateral 2003   crush injuries with multitrauma  . KNEE SURGERY  1996    Allergies  Allergen Reactions  . Cefazolin Rash  . Penicillins     REACTION: unknown allergy - hx since childhood    Allergies as of 11/05/2016      Reactions   Cefazolin Rash   Penicillins    REACTION: unknown allergy - hx since childhood      Medication List       Accurate as of 11/05/16 11:24 AM. Always use your most recent med list.          acetaminophen 500 MG tablet Commonly known as:  TYLENOL Take 2 tablets (1,000 mg total) by mouth 3 (three) times daily.   baclofen 10 MG tablet Commonly known as:  LIORESAL Take 10 mg by mouth 3 (three) times daily.   cholecalciferol 1000 units tablet Commonly known as:  VITAMIN D Take 2,000 Units by mouth every morning. 2 tabs   cloNIDine 0.1 MG tablet Commonly known as:  CATAPRES Take 0.05 mg by mouth daily.  1/2 tablet   clopidogrel 75 MG tablet Commonly known as:  PLAVIX Take 75 mg by mouth every morning.   cyanocobalamin 500 MCG tablet Take 500 mcg by mouth daily.   folic acid 400 MCG tablet Commonly known as:  FOLVITE Take 400 mcg by mouth daily.   gabapentin 100 MG capsule Commonly known as:  NEURONTIN Take 100 mg by mouth 3 (three) times daily.   lamoTRIgine 25 MG tablet Commonly known as:  LAMICTAL Take 50 mg by mouth 3 (three) times daily. 2 tabs   LORazepam 0.5 MG tablet Commonly known as:  ATIVAN Give 1/2 tablet (0.25 mg) by mouth daily in the morning and 1 tablet (0.5 mg) by mouth at bedtime. Hold for sedation.     Magnesium Oxide 250 MG Tabs Take 250 mg by mouth daily.   metoprolol succinate 25 MG 24 hr tablet Commonly known as:  TOPROL-XL Take 25 mg by mouth 2 (two) times daily.   pantoprazole 20 MG tablet Commonly known as:  PROTONIX Take 20 mg by mouth daily.   polycarbophil 625 MG tablet Commonly known as:  FIBERCON Take 1,250 mg by mouth 2 (two) times daily. Take with 8 oz of water   potassium chloride 10 MEQ tablet Commonly known as:  K-DUR,KLOR-CON Take 20 mEq by mouth daily. 2 caps = 20 meq , May open and sprinkle on food   sennosides-docusate sodium 8.6-50 MG tablet Commonly known as:  SENOKOT-S Take 1 tablet by mouth 2 (two) times daily.   sertraline 100 MG tablet Commonly known as:  ZOLOFT Take 150 mg by mouth daily. 1 and 1/2 tablet   tamsulosin 0.4 MG Caps capsule Commonly known as:  FLOMAX Take 0.4 mg by mouth at bedtime.   traMADol 50 MG tablet Commonly known as:  ULTRAM Take 50 mg by mouth 2 (two) times daily as needed.   traMADol 50 MG tablet Commonly known as:  ULTRAM Take 1 tablet (50 mg total) by mouth 2 (two) times daily. Hold for sedation       Review of Systems  Unable to perform ROS: Dementia  Constitutional: Negative for activity change, appetite change, chills and fever.  HENT: Negative.   Respiratory: Negative.   Cardiovascular: Negative.   Gastrointestinal: Negative for abdominal distention and abdominal pain.  Genitourinary: Negative for difficulty urinating, dysuria, frequency, hematuria and urgency.  Musculoskeletal: Positive for arthralgias (typical arthritis). Negative for back pain, gait problem and myalgias.  Skin: Negative for color change, pallor, rash and wound.  Neurological: Negative for dizziness, tremors, syncope, speech difficulty, weakness, numbness and headaches.  Psychiatric/Behavioral: Negative for agitation, behavioral problems and confusion.  All other systems reviewed and are negative.   Immunization History   Administered Date(s) Administered  . Influenza Whole 01/02/2010  . Influenza-Unspecified 12/04/2013, 11/24/2014, 12/05/2015  . PPD Test 12/21/2015  . Pneumococcal-Unspecified 10/14/2012  . Td 01/10/2010   Pertinent  Health Maintenance Due  Topic Date Due  . COLONOSCOPY  08/23/2000  . PNA vac Low Risk Adult (1 of 2 - PCV13) 08/24/2015  . INFLUENZA VACCINE  10/08/2016   Fall Risk  07/27/2012  Falls in the past year? No   Functional Status Survey:    Vitals:   11/05/16 1103  BP: 100/60  Pulse: 68  Resp: (!) 22  Temp: (!) 97.5 F (36.4 C)  SpO2: 95%  Weight: 136 lb 1.6 oz (61.7 kg)  Height: 5' 1.8" (1.57 m)   Body mass index is 25.05 kg/m. Physical Exam  Constitutional: He  is oriented to person, place, and time. Vital signs are normal. He appears well-developed and well-nourished. He is active and cooperative. He does not appear ill. No distress.  HENT:  Head: Normocephalic and atraumatic.  Mouth/Throat: Uvula is midline, oropharynx is clear and moist and mucous membranes are normal. Mucous membranes are not pale, not dry and not cyanotic.  Eyes: Pupils are equal, round, and reactive to light. Conjunctivae, EOM and lids are normal.  Neck: Trachea normal, normal range of motion and full passive range of motion without pain. Neck supple. No JVD present. No tracheal deviation, no edema and no erythema present. No thyromegaly present.  Cardiovascular: Normal rate, intact distal pulses and normal pulses.  An irregular rhythm present. Exam reveals distant heart sounds. Exam reveals no gallop and no friction rub.   No murmur heard. Pulmonary/Chest: Effort normal and breath sounds normal. No accessory muscle usage. No respiratory distress. He has no wheezes. He has no rhonchi. He has no rales. He exhibits no tenderness.  Abdominal: Normal appearance and bowel sounds are normal. He exhibits no distension and no ascites. There is no tenderness.  Musculoskeletal: Normal range of motion.  He exhibits no edema or tenderness.  Expected osteoarthritis, stiffness  Neurological: He is alert and oriented to person, place, and time. He has normal strength. He displays no tremor. He displays no seizure activity. Coordination and gait abnormal.  Skin: Skin is warm, dry and intact. No rash noted. He is not diaphoretic. No cyanosis or erythema. No pallor. Nails show no clubbing.  Psychiatric: He has a normal mood and affect. His speech is normal. Thought content normal. He is agitated. Cognition and memory are impaired. He expresses impulsivity. He exhibits abnormal recent memory.  Nursing note and vitals reviewed.   Labs reviewed:  Recent Labs  12/25/15 0659 07/08/16 0809  NA 138 138  K 4.3 4.3  CL 104 103  CO2 26 29  GLUCOSE 97 86  BUN 24* 25*  CREATININE 1.01 0.94  CALCIUM 9.4 9.6  MG  --  2.0    Recent Labs  12/25/15 0659 07/08/16 0809  AST 20 17  ALT 19 18  ALKPHOS 39 44  BILITOT 0.8 0.7  PROT 7.2 8.0  ALBUMIN 4.2 4.8    Recent Labs  12/25/15 0659 07/08/16 0809  WBC 5.6 5.3  NEUTROABS 2.7 3.1  HGB 14.3 14.0  HCT 42.6 43.0  MCV 88.4 88.4  PLT 137* 141*   Lab Results  Component Value Date   TSH 3.307 07/08/2016   No results found for: HGBA1C Lab Results  Component Value Date   CHOL 86 07/12/2012   HDL 51 01/08/2010   LDLCALC 126 (H) 01/08/2010   TRIG 88 07/12/2012   CHOLHDL 3.8 Ratio 01/08/2010    Significant Diagnostic Results in last 30 days:  No results found.  Assessment/Plan 1. 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 Major depressive disorder, recurrent, in remission (HCC)  Stable  Continue sertraline 150 mg by mouth daily  3. Mood disorder (HCC)  Stable  Continue Lamictal 25 mg tablets 2 tablets 3 times a day for mood disorder  4. Vascular dementia with behavioral disturbance  Stable  Continue clonidine 0.1 mg one half tablet by mouth daily for verbal outbursts  5. Age-related  osteoporosis without current pathological fracture  Stable  Continue cholecalciferol 1000 units 2 tablets by mouth daily  6. Hx of falling  Stable  7. Other chronic pancreatitis (HCC)  Stable  8.  Infarction of spleen  Stable  9. Tachycardia, unspecified  Stable  Continue metoprolol 25 mg by mouth twice a day  10. Other chronic pain  Stable stable  Continue baclofen 10 mg by mouth 3 times a day for leg cramps and spasms  Continue tramadol 50 mg by mouth twice a day for breakthrough pain  Continue Tylenol 1000 mg by mouth 3 times a day  11. Systolic congestive heart failure, unspecified HF chronicity (HCC)  Stable  Continue Plavix 75 mg by mouth daily  Continue potassium chloride 20 mEq by mouth daily  12. Overactive bladder  Stable  13. Gastroesophageal reflux disease without esophagitis  Stable  Continue Fiber-Lax 25 mg 2 tablets by mouth twice a day  Continue protonix 20 mg by mouth daily  Continue senna S 1 tablet by mouth twice a day  14. Anxiety disorder, unspecified type  Stable  Continue lorazepam 0.5 mg one half tablet in the morning and one tablet by mouth daily at bedtime  15. Chronic pain syndrome  Stable  Continue gabapentin 100 mg by mouth 3 times a day  16. Urinary incontinence, unspecified type  Stable  Frequent monitoring to ensure dry brief  17. Hypomagnesemia  Stable  Continue Magnesium Oxide 250 mg po Q Day  18. Benign prostatic hyperplasia with urinary frequency  Stable  Continue Tamsulosin 0.4 mg po Q HS  19. Vitamin B12 deficiency  Stable  Continue Cyanocobalamin to 500 mcg po Q Day  Continue Folic acid 400 mcg po Q Day   Family/ staff Communication:   Total Time:  Documentation:  Face to Face:  Family/Phone:   Labs/tests ordered:    Medication list reviewed and assessed for continued appropriateness. Monthly medication orders reviewed and signed.  Brynda Rim,  NP-C Geriatrics [facility] [phone] [address] [place], [place] [address] Cell Phone (Mon-Fri 8am-5pm):  [phone] On Call:  [phone] & follow prompts after 5pm & weekends Office Phone:  [phone] Office Fax:  [phone]

## 2016-11-08 ENCOUNTER — Encounter
Admission: RE | Admit: 2016-11-08 | Discharge: 2016-11-08 | Disposition: A | Discharge status: Home / Self Care | Payer: Medicare Other | Source: Ambulatory Visit | Attending: Internal Medicine | Admitting: Internal Medicine

## 2016-12-04 ENCOUNTER — Non-Acute Institutional Stay (SKILLED_NURSING_FACILITY): Payer: Medicare Other | Admitting: Gerontology

## 2016-12-04 ENCOUNTER — Encounter: Payer: Self-pay | Admitting: Gerontology

## 2016-12-04 DIAGNOSIS — R000 Tachycardia, unspecified: Secondary | ICD-10-CM | POA: Diagnosis not present

## 2016-12-04 DIAGNOSIS — D735 Infarction of spleen: Secondary | ICD-10-CM | POA: Diagnosis not present

## 2016-12-04 DIAGNOSIS — K219 Gastro-esophageal reflux disease without esophagitis: Secondary | ICD-10-CM

## 2016-12-04 DIAGNOSIS — F3340 Major depressive disorder, recurrent, in remission, unspecified: Secondary | ICD-10-CM | POA: Diagnosis not present

## 2016-12-04 DIAGNOSIS — I5020 Unspecified systolic (congestive) heart failure: Secondary | ICD-10-CM

## 2016-12-04 DIAGNOSIS — F0151 Vascular dementia with behavioral disturbance: Secondary | ICD-10-CM

## 2016-12-04 DIAGNOSIS — F419 Anxiety disorder, unspecified: Secondary | ICD-10-CM

## 2016-12-04 DIAGNOSIS — F39 Unspecified mood [affective] disorder: Secondary | ICD-10-CM

## 2016-12-04 DIAGNOSIS — M810 Age-related osteoporosis without current pathological fracture: Secondary | ICD-10-CM

## 2016-12-04 DIAGNOSIS — G8929 Other chronic pain: Secondary | ICD-10-CM

## 2016-12-04 DIAGNOSIS — N3281 Overactive bladder: Secondary | ICD-10-CM

## 2016-12-04 DIAGNOSIS — K861 Other chronic pancreatitis: Secondary | ICD-10-CM | POA: Diagnosis not present

## 2016-12-04 DIAGNOSIS — F1097 Alcohol use, unspecified with alcohol-induced persisting dementia: Secondary | ICD-10-CM | POA: Diagnosis not present

## 2016-12-04 DIAGNOSIS — F01518 Vascular dementia, unspecified severity, with other behavioral disturbance: Secondary | ICD-10-CM

## 2016-12-04 DIAGNOSIS — R32 Unspecified urinary incontinence: Secondary | ICD-10-CM

## 2016-12-04 DIAGNOSIS — Z9181 History of falling: Secondary | ICD-10-CM

## 2016-12-04 DIAGNOSIS — E538 Deficiency of other specified B group vitamins: Secondary | ICD-10-CM

## 2016-12-04 DIAGNOSIS — N401 Enlarged prostate with lower urinary tract symptoms: Secondary | ICD-10-CM

## 2016-12-04 DIAGNOSIS — R350 Frequency of micturition: Secondary | ICD-10-CM

## 2016-12-04 DIAGNOSIS — G894 Chronic pain syndrome: Secondary | ICD-10-CM

## 2016-12-04 NOTE — Progress Notes (Signed)
Location:   [facility] Room Number: 323A Place of Service:  SNF [phone]) Provider:  Lorenso Quarry, NP-C  Lauro Regulus, MD  Patient Care Team: Lauro Regulus, MD as PCP - General (Unknown Physician Specialty) Durene Romans, MD as Consulting Physician (Orthopedic Surgery)  Extended Emergency Contact Information Primary Emergency Contact: Tula Nakayama Address: [address]          [place], [place] [address] [place] of [place] Home Phone: [phone] Mobile Phone: [phone] Relation: Sister  Code Status:  Full Goals of care: Advanced Directive information Advanced Directives 12/04/2016  Does Patient Have a Medical Advance Directive? No  Pre-existing out of facility DNR order (yellow form or pink MOST form) -     Chief Complaint  Patient presents with  . Medical Management of Chronic Issues    Routine Visit    HPI:  Pt is a 65 y.o. male seen today for medical management of chronic diseases. Patient has been stable over the last month. Patient has not had any reported falls. Patient is incontinent of bowel and bladder, wears adult briefs. Patient is not able to consistently verbalize his needs accurately. [place]. If he is in need of something or in need to go to the bathroom, he typically will just yell out "damn, damn" to get attention. Otherwise, patient reports he is feeling fine. Vital signs have been stable. No new complaints.   Past Medical History:  Diagnosis Date  . Alcoholic hepatitis 2001  . Anxiety   . Chronic pain   . 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 Depression, major, in remission (HCC) 05/10/2015  . Epistaxis 2001   required cauterization  . Fracture of left humerus   . GERD (gastroesophageal reflux disease)   . History of alcoholism (HCC)   . History of pancreatitis 05/10/2015  . Hypertonicity of bladder   . Lumbar  spondylitis (HCC)   . Osteoporosis 05/10/2015  . Stroke (HCC)   . 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   . Thrombus of left atrial appendage 2003  . Urinary incontinence    Past Surgical History:  Procedure Laterality Date  . FEMUR IM NAIL Left 06/25/2012   Procedure: INTRAMEDULLARY (IM) NAIL FEMORAL;  Surgeon: Shelda Pal, MD;  Location: [facility];  Service: Orthopedics;  Laterality: Left;  . HAND SURGERY Bilateral 2003   crush injuries with multitrauma  . KNEE SURGERY  1996    Allergies  Allergen Reactions  . Cefazolin Rash  . Penicillins     REACTION: unknown allergy - hx since childhood    Allergies as of 12/04/2016      Reactions   Cefazolin Rash   Penicillins    REACTION: unknown allergy - hx since childhood      Medication List       Accurate as of 12/04/16  2:15 PM. Always use your most recent med list.          acetaminophen 500 MG tablet Commonly known as:  TYLENOL Take 2 tablets (1,000 mg total) by mouth 3 (three) times daily.   baclofen 10 MG tablet Commonly known as:  LIORESAL Take 10 mg by mouth 3 (three) times daily.   cholecalciferol 1000 units tablet Commonly known as:  VITAMIN D Take 2,000 Units by mouth every morning. 2 tabs   cloNIDine 0.1 MG tablet Commonly known as:  CATAPRES Take 0.05 mg by mouth  daily. 1/2 tablet   clopidogrel 75 MG tablet Commonly known as:  PLAVIX Take 75 mg by mouth every morning.   cyanocobalamin 500 MCG tablet Take 500 mcg by mouth daily.   folic acid 400 MCG tablet Commonly known as:  FOLVITE Take 400 mcg by mouth daily.   gabapentin 100 MG capsule Commonly known as:  NEURONTIN Take 100 mg by mouth 3 (three) times daily.   lamoTRIgine 25 MG tablet Commonly known as:  LAMICTAL Take 50 mg by mouth 3 (three) times daily. 2 tabs   LORazepam 0.5 MG tablet Commonly known as:  ATIVAN Give 1/2 tablet (0.25 mg) by mouth daily in the morning and 1 tablet (0.5 mg) by mouth at bedtime. Hold for sedation.     Magnesium Oxide 250 MG Tabs Take 250 mg by mouth daily.   metoprolol succinate 25 MG 24 hr tablet Commonly known as:  TOPROL-XL Take 25 mg by mouth 2 (two) times daily.   pantoprazole 20 MG tablet Commonly known as:  PROTONIX Take 20 mg by mouth daily.   polycarbophil 625 MG tablet Commonly known as:  FIBERCON Take 1,250 mg by mouth 2 (two) times daily. Take with 8 oz of water   potassium chloride 10 MEQ tablet Commonly known as:  K-DUR,KLOR-CON Take 20 mEq by mouth daily. 2 caps = 20 meq , May open and sprinkle on food   sennosides-docusate sodium 8.6-50 MG tablet Commonly known as:  SENOKOT-S Take 1 tablet by mouth 2 (two) times daily.   sertraline 100 MG tablet Commonly known as:  ZOLOFT Take 150 mg by mouth daily. 1 and 1/2 tablet   tamsulosin 0.4 MG Caps capsule Commonly known as:  FLOMAX Take 0.4 mg by mouth at bedtime.   traMADol 50 MG tablet Commonly known as:  ULTRAM Take 50 mg by mouth 2 (two) times daily as needed.   traMADol 50 MG tablet Commonly known as:  ULTRAM Take 1 tablet (50 mg total) by mouth 2 (two) times daily. Hold for sedation       Review of Systems  Unable to perform ROS: Dementia  Constitutional: Negative for activity change, appetite change, chills and fever.  HENT: Negative.   Respiratory: Negative.   Cardiovascular: Negative.   Gastrointestinal: Negative for abdominal distention and abdominal pain.  Genitourinary: Negative for difficulty urinating, dysuria, frequency, hematuria and urgency.  Musculoskeletal: Positive for arthralgias (typical arthritis). Negative for back pain, gait problem and myalgias.  Skin: Negative for color change, pallor, rash and wound.  Neurological: Negative for dizziness, tremors, syncope, speech difficulty, weakness, numbness and headaches.  Psychiatric/Behavioral: Negative for agitation, behavioral problems and confusion.  All other systems reviewed and are negative.   Immunization History   Administered Date(s) Administered  . Influenza Whole 01/02/2010  . Influenza-Unspecified 12/04/2013, 11/24/2014, 12/05/2015  . PPD Test 12/21/2015  . Pneumococcal-Unspecified 10/14/2012  . Td 01/10/2010   Pertinent  Health Maintenance Due  Topic Date Due  . COLONOSCOPY  08/23/2000  . PNA vac Low Risk Adult (1 of 2 - PCV13) 08/24/2015  . INFLUENZA VACCINE  10/08/2016   Fall Risk  07/27/2012  Falls in the past year? No   Functional Status Survey:    Vitals:   12/04/16 1349  BP: 98/73  Pulse: 88  Resp: 16  Temp: (!) 97.5 F (36.4 C)  SpO2: 93%  Weight: 146 lb 6.4 oz (66.4 kg)  Height:  (1.651 m)   Body mass index is 24.36 kg/m. Physical Exam  Constitutional: He  is oriented to person, place, and time. Vital signs are normal. He appears well-developed and well-nourished. He is active and cooperative. He does not appear ill. No distress.  HENT:  Head: Normocephalic and atraumatic.  Mouth/Throat: Uvula is midline, oropharynx is clear and moist and mucous membranes are normal. Mucous membranes are not pale, not dry and not cyanotic.  Eyes: Pupils are equal, round, and reactive to light. Conjunctivae, EOM and lids are normal.  Neck: Trachea normal, normal range of motion and full passive range of motion without pain. Neck supple. No JVD present. No tracheal deviation, no edema and no erythema present. No thyromegaly present.  Cardiovascular: Normal rate, intact distal pulses and normal pulses.  An irregular rhythm present. Exam reveals distant heart sounds. Exam reveals no gallop and no friction rub.   No murmur heard. Pulmonary/Chest: Effort normal and breath sounds normal. No accessory muscle usage. No respiratory distress. He has no wheezes. He has no rhonchi. He has no rales. He exhibits no tenderness.  Abdominal: Normal appearance and bowel sounds are normal. He exhibits no distension and no ascites. There is no tenderness.  Musculoskeletal: Normal range of motion. He  exhibits no edema or tenderness.  Expected osteoarthritis, stiffness  Neurological: He is alert and oriented to person, place, and time. He has normal strength. He displays no tremor. He displays no seizure activity. Coordination and gait abnormal.  Skin: Skin is warm, dry and intact. No rash noted. He is not diaphoretic. No cyanosis or erythema. No pallor. Nails show no clubbing.  Psychiatric: He has a normal mood and affect. His speech is normal. Thought content normal. He is agitated. Cognition and memory are impaired. He expresses impulsivity. He exhibits abnormal recent memory.  Nursing note and vitals reviewed.   Labs reviewed:  Recent Labs  12/25/15 0659 07/08/16 0809  NA 138 138  K 4.3 4.3  CL 104 103  CO2 26 29  GLUCOSE 97 86  BUN 24* 25*  CREATININE 1.01 0.94  CALCIUM 9.4 9.6  MG  --  2.0    Recent Labs  12/25/15 0659 07/08/16 0809  AST 20 17  ALT 19 18  ALKPHOS 39 44  BILITOT 0.8 0.7  PROT 7.2 8.0  ALBUMIN 4.2 4.8    Recent Labs  12/25/15 0659 07/08/16 0809  WBC 5.6 5.3  NEUTROABS 2.7 3.1  HGB 14.3 14.0  HCT 42.6 43.0  MCV 88.4 88.4  PLT 137* 141*   Lab Results  Component Value Date   TSH 3.307 07/08/2016   No results found for: HGBA1C Lab Results  Component Value Date   CHOL 86 07/12/2012   HDL 51 01/08/2010   LDLCALC 126 (H) 01/08/2010   TRIG 88 07/12/2012   CHOLHDL 3.8 Ratio 01/08/2010    Significant Diagnostic Results in last 30 days:  No results found.  Assessment/Plan 1. 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 Major depressive disorder, recurrent, in remission (HCC)  Stable  Continue sertraline 150 mg by mouth daily  3. Mood disorder (HCC)  Stable  Continue Lamictal 25 mg tablets 2 tablets 3 times a day for mood disorder  4. Vascular dementia with behavioral disturbance  Stable  Continue clonidine 0.1 mg one half tablet by mouth daily for verbal outbursts  5. Age-related  osteoporosis without current pathological fracture  Stable  Continue cholecalciferol 1000 units 2 tablets by mouth daily  6. Hx of falling  Stable  7. Other chronic pancreatitis (HCC)  Stable  8.  Infarction of spleen  Stable  9. Tachycardia, unspecified  Stable  Continue metoprolol 25 mg by mouth twice a day  10. Other chronic pain  Stable stable  Continue baclofen 10 mg by mouth 3 times a day for leg cramps and spasms  Continue tramadol 50 mg by mouth twice a day for breakthrough pain  Continue Tylenol 1000 mg by mouth 3 times a day  11. Systolic congestive heart failure, unspecified HF chronicity (HCC)  Stable  Continue Plavix 75 mg by mouth daily  Continue potassium chloride 20 mEq by mouth daily  12. Overactive bladder  Stable  13. Gastroesophageal reflux disease without esophagitis  Stable  Continue Fiber-Lax 25 mg 2 tablets by mouth twice a day  Continue protonix 20 mg by mouth daily  Continue senna S 1 tablet by mouth twice a day  14. Anxiety disorder, unspecified type  Stable  Continue lorazepam 0.5 mg one half tablet in the morning and one tablet by mouth daily at bedtime  15. Chronic pain syndrome  Stable  Continue gabapentin 100 mg by mouth 3 times a day  16. Urinary incontinence, unspecified type  Stable  Frequent monitoring to ensure dry brief  17. Hypomagnesemia  Stable  Continue Magnesium Oxide 250 mg po Q Day  18. Benign prostatic hyperplasia with urinary frequency  Stable  Continue Tamsulosin 0.4 mg po Q HS  19. Vitamin B12 deficiency  Stable  Continue Cyanocobalamin to 500 mcg po Q Day  Continue Folic acid 400 mcg po Q Day   Family/ staff Communication:   Total Time:  Documentation:  Face to Face:  Family/Phone:   Labs/tests ordered:    Medication list reviewed and assessed for continued appropriateness. Monthly medication orders reviewed and signed.  Brynda Rim,  NP-C [facility] [phone] [place], [place] [address] Cell Phone (Mon-Fri 8am-5pm):  [phone] On Call:  [phone] & follow prompts after 5pm & weekends Office Phone:  [phone] Office Fax:  [phone]

## 2016-12-05 ENCOUNTER — Other Ambulatory Visit: Payer: Self-pay

## 2016-12-05 MED ORDER — LORAZEPAM 0.5 MG PO TABS: 0.2500 mg | ORAL_TABLET | Freq: Two times a day (BID) | ORAL | 2 refills | Status: DC

## 2016-12-05 NOTE — Telephone Encounter (Signed)
Rx sent to [facility] phone : [phone] , fax : [phone]  

## 2016-12-08 ENCOUNTER — Encounter
Admission: RE | Admit: 2016-12-08 | Discharge: 2016-12-08 | Disposition: A | Discharge status: Home / Self Care | Payer: Medicare Other | Source: Ambulatory Visit | Attending: Internal Medicine | Admitting: Internal Medicine

## 2016-12-12 ENCOUNTER — Other Ambulatory Visit
Admission: RE | Admit: 2016-12-12 | Discharge: 2016-12-12 | Disposition: A | Discharge status: Home / Self Care | Payer: Medicare Other | Source: Other Acute Inpatient Hospital | Attending: Internal Medicine | Admitting: Internal Medicine

## 2016-12-12 DIAGNOSIS — R319 Hematuria, unspecified: Secondary | ICD-10-CM | POA: Diagnosis present

## 2016-12-12 DIAGNOSIS — R300 Dysuria: Secondary | ICD-10-CM | POA: Diagnosis not present

## 2016-12-12 LAB — URINALYSIS, COMPLETE (UACMP) WITH MICROSCOPIC
BILIRUBIN URINE: NEGATIVE
Glucose, UA: NEGATIVE mg/dL
KETONES UR: NEGATIVE mg/dL
NITRITE: NEGATIVE
Protein, ur: 100 mg/dL — AB
SPECIFIC GRAVITY, URINE: 1.026 (ref 1.005–1.030)
SQUAMOUS EPITHELIAL / LPF: NONE SEEN
pH: 7 (ref 5.0–8.0)

## 2016-12-14 LAB — URINE CULTURE

## 2017-01-08 ENCOUNTER — Encounter
Admission: RE | Admit: 2017-01-08 | Discharge: 2017-01-08 | Disposition: A | Discharge status: Home / Self Care | Payer: Medicare Other | Source: Ambulatory Visit | Attending: Internal Medicine | Admitting: Internal Medicine

## 2017-01-08 ENCOUNTER — Non-Acute Institutional Stay (SKILLED_NURSING_FACILITY): Payer: Medicare Other | Admitting: Gerontology

## 2017-01-08 ENCOUNTER — Encounter: Payer: Self-pay | Admitting: Gerontology

## 2017-01-08 DIAGNOSIS — F0151 Vascular dementia with behavioral disturbance: Secondary | ICD-10-CM | POA: Diagnosis not present

## 2017-01-08 DIAGNOSIS — F01518 Vascular dementia, unspecified severity, with other behavioral disturbance: Secondary | ICD-10-CM

## 2017-01-08 DIAGNOSIS — N3281 Overactive bladder: Secondary | ICD-10-CM | POA: Diagnosis not present

## 2017-01-08 DIAGNOSIS — G8929 Other chronic pain: Secondary | ICD-10-CM | POA: Diagnosis not present

## 2017-01-08 DIAGNOSIS — R32 Unspecified urinary incontinence: Secondary | ICD-10-CM

## 2017-01-08 DIAGNOSIS — K861 Other chronic pancreatitis: Secondary | ICD-10-CM | POA: Diagnosis not present

## 2017-01-08 DIAGNOSIS — Z9181 History of falling: Secondary | ICD-10-CM | POA: Diagnosis not present

## 2017-01-08 DIAGNOSIS — F1097 Alcohol use, unspecified with alcohol-induced persisting dementia: Secondary | ICD-10-CM | POA: Diagnosis not present

## 2017-01-08 DIAGNOSIS — M810 Age-related osteoporosis without current pathological fracture: Secondary | ICD-10-CM | POA: Diagnosis not present

## 2017-01-08 DIAGNOSIS — I5020 Unspecified systolic (congestive) heart failure: Secondary | ICD-10-CM

## 2017-01-08 DIAGNOSIS — D735 Infarction of spleen: Secondary | ICD-10-CM | POA: Diagnosis not present

## 2017-01-08 DIAGNOSIS — F39 Unspecified mood [affective] disorder: Secondary | ICD-10-CM | POA: Diagnosis not present

## 2017-01-08 DIAGNOSIS — R350 Frequency of micturition: Secondary | ICD-10-CM

## 2017-01-08 DIAGNOSIS — F3340 Major depressive disorder, recurrent, in remission, unspecified: Secondary | ICD-10-CM | POA: Diagnosis not present

## 2017-01-08 DIAGNOSIS — R000 Tachycardia, unspecified: Secondary | ICD-10-CM | POA: Diagnosis not present

## 2017-01-08 DIAGNOSIS — E538 Deficiency of other specified B group vitamins: Secondary | ICD-10-CM

## 2017-01-08 DIAGNOSIS — F419 Anxiety disorder, unspecified: Secondary | ICD-10-CM

## 2017-01-08 DIAGNOSIS — K219 Gastro-esophageal reflux disease without esophagitis: Secondary | ICD-10-CM

## 2017-01-08 DIAGNOSIS — G894 Chronic pain syndrome: Secondary | ICD-10-CM

## 2017-01-08 DIAGNOSIS — N401 Enlarged prostate with lower urinary tract symptoms: Secondary | ICD-10-CM

## 2017-01-21 NOTE — Progress Notes (Signed)
[facility]   [facility] Room Number: [place] of Service:  SNF [phone]) Provider:  Toni Arthurs, NP-C  Kirk Ruths, MD  Patient Care Team: Kirk Ruths, MD as PCP - General (Unknown Physician Specialty) Paralee Cancel, MD as Consulting Physician (Orthopedic Surgery)  Extended Emergency Contact Information Primary Emergency Contact: Suzan Garibaldi Address: [address]          [place], [place] [address] [name] of [place] Phone: [phone] Mobile Phone: [phone] Relation: Sister  Code Status:  Full Goals of care: Advanced Directive information Advanced Directives 12/04/2016  Does Patient Have a Medical Advance Directive? No  Pre-existing out of facility DNR order (yellow form or pink MOST form) -     Chief Complaint  Patient presents with  . Medical Management of Chronic Issues    Routine Visit    HPI:  Pt is a 66 y.o. male seen today for medical management of chronic diseases.  Patient has been stable over the past month.  Patient has not had any reported falls.  Patient is incontinent of bowel and bladder, wears adult briefs.  Patient is not able to consistently verbalizes needs accurately.  [place]  If he is in need of something or in need to go to the bathroom, he typically will just yell out "damn, damn!"  To get attention.  Otherwise, patient reports he is feeling fine.  Patient did have a UTI earlier this month.  Greater than 100,000 colonies Proteus mirabilis, sensitive to Bactrim DS.  Patient responded well to medication.  Vital signs have been stable.  No new complaints.   Past Medical History:  Diagnosis Date  . Alcoholic hepatitis 5053  . Anxiety   . Chronic pain   . 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 Depression, major, in remission ([place]) 05/10/2015  . Epistaxis 2001   required cauterization  . Fracture of left  humerus   . GERD (gastroesophageal reflux disease)   . History of alcoholism ([place])   . History of pancreatitis 05/10/2015  . Hypertonicity of bladder   . Lumbar spondylitis ([place])   . Osteoporosis 05/10/2015  . Stroke ([place])   . Substance abuse ([place]) 2001   MJA and cocaine.   . Thrombus of left atrial appendage 2003  . Urinary incontinence    Past Surgical History:  Procedure Laterality Date  . HAND SURGERY Bilateral 2003   crush injuries with multitrauma  . KNEE SURGERY  1996    Allergies  Allergen Reactions  . Cefazolin Rash  . Penicillins     REACTION: unknown allergy - hx since childhood    Allergies as of 01/08/2017      Reactions   Cefazolin Rash   Penicillins    REACTION: unknown allergy - hx since childhood      Medication List        Accurate as of 01/08/17 11:59 PM. Always use your most recent med list.          acetaminophen 500 MG tablet Commonly known as:  TYLENOL Take 2 tablets (1,000 mg total) by mouth 3 (three) times daily.   baclofen 10 MG tablet Commonly known as:  LIORESAL Take 10 mg by mouth 3 (three) times daily.   cholecalciferol 1000 units tablet Commonly known as:  VITAMIN D Take 2,000 Units by mouth every morning. 2 tabs   cloNIDine 0.1 MG tablet Commonly known as:  CATAPRES Take 0.05 mg by mouth daily. 1/2 tablet   clopidogrel 75 MG tablet Commonly known as:  PLAVIX Take 75 mg by mouth every morning.   cyanocobalamin 500 MCG tablet Take 500 mcg by mouth daily.   folic acid 378 MCG tablet Commonly known as:  FOLVITE Take 400 mcg by mouth daily.   gabapentin 100 MG capsule Commonly known as:  NEURONTIN Take 100 mg by mouth 3 (three) times daily.   lamoTRIgine 25 MG tablet Commonly known as:  LAMICTAL Take 50 mg by mouth 3 (three) times daily. 2 tabs   LORazepam 0.5 MG tablet Commonly known as:  ATIVAN Take 0.5 tablets (0.25 mg total) by mouth 2 (two) times daily. 1/2 tab; hold for sedation   magnesium hydroxide 400  MG/5ML suspension Commonly known as:  MILK OF MAGNESIA Take 30 mLs by mouth every 4 (four) hours as needed for mild constipation. Constipation / no BM for 2 days   Magnesium Oxide 250 MG Tabs Take 250 mg by mouth daily.   metoprolol succinate 25 MG 24 hr tablet Commonly known as:  TOPROL-XL Take 25 mg by mouth 2 (two) times daily.   pantoprazole 20 MG tablet Commonly known as:  PROTONIX Take 20 mg by mouth every morning.   polycarbophil 625 MG tablet Commonly known as:  FIBERCON Take 1,250 mg by mouth 2 (two) times daily. Take with 8 oz of water   potassium chloride 10 MEQ tablet Commonly known as:  K-DUR,KLOR-CON Take 20 mEq by mouth daily. 2 caps = 20 meq , May open and sprinkle on food   sennosides-docusate sodium 8.6-50 MG tablet Commonly known as:  SENOKOT-S Take 1 tablet by mouth 2 (two) times daily.   sertraline 100 MG tablet Commonly known as:  ZOLOFT Take 150 mg by mouth daily. 1 and 1/2 tablet   tamsulosin 0.4 MG Caps capsule Commonly known as:  FLOMAX Take 0.4 mg by mouth at bedtime.   traMADol 50 MG tablet Commonly known as:  ULTRAM Take 50 mg by mouth 2 (two) times daily as needed.   traMADol 50 MG tablet Commonly known as:  ULTRAM Take 1 tablet (50 mg total) by mouth 2 (two) times daily. Hold for sedation       Review of Systems  Unable to perform ROS: Dementia  Constitutional: Negative for activity change, appetite change, chills and fever.  HENT: Negative.   Respiratory: Negative.   Cardiovascular: Negative.   Gastrointestinal: Negative for abdominal distention and abdominal pain.  Genitourinary: Negative for difficulty urinating, dysuria, frequency, hematuria and urgency.  Musculoskeletal: Positive for arthralgias (typical arthritis). Negative for back pain, gait problem and myalgias.  Skin: Negative for color change, pallor, rash and wound.  Neurological: Negative for dizziness, tremors, syncope, speech difficulty, weakness, numbness and  headaches.  Psychiatric/Behavioral: Negative for agitation, behavioral problems and confusion.  All other systems reviewed and are negative.   Immunization History  Administered Date(s) Administered  . Influenza Whole 01/02/2010  . Influenza-Unspecified 12/04/2013, 11/24/2014, 12/05/2015, 12/04/2016  . PPD Test 12/21/2015, 12/08/2016  . Pneumococcal-Unspecified 10/14/2012  . Td 01/10/2010   Pertinent  Health Maintenance Due  Topic Date Due  . COLONOSCOPY  08/23/2000  . PNA vac Low Risk Adult (1 of 2 - PCV13) 08/24/2015  . INFLUENZA VACCINE  Completed   Fall Risk  07/27/2012  Falls in the past year? No   Functional Status Survey:    Vitals:   01/08/17 1057  BP: 122/68  Pulse: (!) 103  Resp:  16  Temp: (!) 97.5 F (36.4 C)  SpO2: 93%  Weight: 148 lb 12.8 oz (67.5 kg)  Height: 5' 5"  (1.651 m)   Body mass index is 24.76 kg/m. Physical Exam  Constitutional: He is oriented to person, place, and time. Vital signs are normal. He appears well-developed and well-nourished. He is active and cooperative. He does not appear ill. No distress.  HENT:  Head: Normocephalic and atraumatic.  Mouth/Throat: Uvula is midline, oropharynx is clear and moist and mucous membranes are normal. Mucous membranes are not pale, not dry and not cyanotic.  Eyes: Conjunctivae, EOM and lids are normal. Pupils are equal, round, and reactive to light.  Neck: Trachea normal, normal range of motion and full passive range of motion without pain. Neck supple. No JVD present. No tracheal deviation, no edema and no erythema present. No thyromegaly present.  Cardiovascular: Normal rate, intact distal pulses and normal pulses. An irregular rhythm present. Exam reveals distant heart sounds. Exam reveals no gallop and no friction rub.  No murmur heard. Pulses:      Dorsalis pedis pulses are 2+ on the right side, and 2+ on the left side.  No edema  Pulmonary/Chest: Effort normal and breath sounds normal. No accessory  muscle usage. No respiratory distress. He has no wheezes. He has no rhonchi. He has no rales. He exhibits no tenderness.  Abdominal: Soft. Normal appearance and bowel sounds are normal. He exhibits no distension and no ascites. There is no tenderness.  Musculoskeletal: Normal range of motion. He exhibits no edema or tenderness.  Expected osteoarthritis, stiffness  Neurological: He is alert and oriented to person, place, and time. He has normal strength. He displays no tremor. He displays no seizure activity. Coordination and gait abnormal.  Skin: Skin is warm, dry and intact. No rash noted. He is not diaphoretic. No cyanosis or erythema. No pallor. Nails show no clubbing.  Psychiatric: His speech is normal. Thought content normal. His affect is blunt. Cognition and memory are impaired. He expresses impulsivity. He exhibits abnormal recent memory.  Nursing note and vitals reviewed.   Labs reviewed: Recent Labs    07/08/16 0809  NA 138  K 4.3  CL 103  CO2 29  GLUCOSE 86  BUN 25*  CREATININE 0.94  CALCIUM 9.6  MG 2.0   Recent Labs    07/08/16 0809  AST 17  ALT 18  ALKPHOS 44  BILITOT 0.7  PROT 8.0  ALBUMIN 4.8   Recent Labs    07/08/16 0809  WBC 5.3  NEUTROABS 3.1  HGB 14.0  HCT 43.0  MCV 88.4  PLT 141*   Lab Results  Component Value Date   TSH 3.307 07/08/2016   No results found for: HGBA1C Lab Results  Component Value Date   CHOL 86 07/12/2012   HDL 51 01/08/2010   LDLCALC 126 (H) 01/08/2010   TRIG 88 07/12/2012   CHOLHDL 3.8 Ratio 01/08/2010    Significant Diagnostic Results in last 30 days:  No results found.  Assessment/Plan 1. 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 Major depressive disorder, recurrent, in remission (HCC)  Stable  Continue sertraline 150 mg by mouth daily  3. Mood disorder (HCC)  Stable  Continue Lamictal 25 mg tablets 2 tablets 3 times a day for mood disorder  4. Vascular dementia with  behavioral disturbance  Stable  Continue clonidine 0.1 mg one half tablet by mouth daily for verbal outbursts  5. Age-related osteoporosis without current pathological  fracture  Stable  Continue cholecalciferol 1000 units 2 tablets by mouth daily  6. Hx of falling  Stable  7. Other chronic pancreatitis (HCC)  Stable  8. Infarction of spleen  Stable  9. Tachycardia, unspecified  Stable  Continue metoprolol 25 mg by mouth twice a day  10. Other chronic pain  Stable stable  Continue baclofen 10 mg by mouth 3 times a day for leg cramps and spasms  Continue tramadol 50 mg by mouth twice a day for breakthrough pain  Continue Tylenol 1000 mg by mouth 3 times a day  11. Systolic congestive heart failure, unspecified HF chronicity (HCC)  Stable  Continue Plavix 75 mg by mouth daily  Continue potassium chloride 20 mEq by mouth daily  12. Overactive bladder  Stable  13. Gastroesophageal reflux disease without esophagitis  Stable  Continue Fiber-Lax 25 mg 2 tablets by mouth twice a day  Continue protonix 20 mg by mouth daily  Continue senna S 1 tablet by mouth twice a day  14. Anxiety disorder, unspecified type  Stable  Continue lorazepam 0.5 mg one half tablet BID- Attempting GDR  15. Chronic pain syndrome  Stable  Continue gabapentin 100 mg by mouth 3 times a day  16. Urinary incontinence, unspecified type  Stable  Frequent monitoring to ensure dry brief  17. Hypomagnesemia  Stable  Continue Magnesium Oxide 250 mg po Q Day  18. Benign prostatic hyperplasia with urinary frequency  Stable  Continue Tamsulosin 0.4 mg po Q HS  19. Vitamin B12 deficiency  Stable  Continue Cyanocobalamin to 500 mcg po Q Day  Continue Folic acid 034 mcg po Q Day  Continue all medications as listed above  Family/ staff Communication:   Total Time:  Documentation:  Face to Face:  Family/Phone:   Labs/tests ordered: CBC, met C, vitamin B12,  TSH prior to next month's visit  Medication list reviewed and assessed for continued appropriateness. Monthly medication orders reviewed and signed.  Vikki Ports, NP-C [facility] [phone], [place] [address] Cell Phone (Mon-Fri 8am-5pm):  [phone] On Call:  [phone] & follow prompts after 5pm & weekends Office Phone:  [phone] Office Fax:  [phone]

## 2017-01-22 ENCOUNTER — Other Ambulatory Visit
Admission: RE | Admit: 2017-01-22 | Discharge: 2017-01-22 | Disposition: A | Discharge status: Home / Self Care | Payer: Medicare Other | Source: Ambulatory Visit | Attending: Gerontology | Admitting: Gerontology

## 2017-01-22 DIAGNOSIS — F1097 Alcohol use, unspecified with alcohol-induced persisting dementia: Secondary | ICD-10-CM | POA: Diagnosis present

## 2017-01-22 LAB — CBC WITH DIFFERENTIAL/PLATELET
BASOS PCT: 0 %
Basophils Absolute: 0 10*3/uL (ref 0–0.1)
EOS ABS: 0.1 10*3/uL (ref 0–0.7)
EOS PCT: 2 %
HCT: 41.8 % (ref 40.0–52.0)
HEMOGLOBIN: 13.6 g/dL (ref 13.0–18.0)
Lymphocytes Relative: 34 %
Lymphs Abs: 2.1 10*3/uL (ref 1.0–3.6)
MCH: 29.2 pg (ref 26.0–34.0)
MCHC: 32.6 g/dL (ref 32.0–36.0)
MCV: 89.4 fL (ref 80.0–100.0)
MONO ABS: 0.5 10*3/uL (ref 0.2–1.0)
MONOS PCT: 8 %
NEUTROS PCT: 56 %
Neutro Abs: 3.6 10*3/uL (ref 1.4–6.5)
PLATELETS: 131 10*3/uL — AB (ref 150–440)
RBC: 4.67 MIL/uL (ref 4.40–5.90)
RDW: 14.2 % (ref 11.5–14.5)
WBC: 6.3 10*3/uL (ref 3.8–10.6)

## 2017-01-22 LAB — COMPREHENSIVE METABOLIC PANEL
ALBUMIN: 4.3 g/dL (ref 3.5–5.0)
ALT: 15 U/L — ABNORMAL LOW (ref 17–63)
AST: 14 U/L — ABNORMAL LOW (ref 15–41)
Alkaline Phosphatase: 51 U/L (ref 38–126)
Anion gap: 8 (ref 5–15)
BUN: 31 mg/dL — ABNORMAL HIGH (ref 6–20)
CHLORIDE: 103 mmol/L (ref 101–111)
CO2: 27 mmol/L (ref 22–32)
CREATININE: 0.98 mg/dL (ref 0.61–1.24)
Calcium: 9.6 mg/dL (ref 8.9–10.3)
GFR calc Af Amer: >60 mL/min (ref >60)
GFR calc non Af Amer: >60 mL/min (ref >60)
GLUCOSE: 95 mg/dL (ref 65–99)
Potassium: 4.4 mmol/L (ref 3.5–5.1)
SODIUM: 138 mmol/L (ref 135–145)
Total Bilirubin: 0.6 mg/dL (ref 0.3–1.2)
Total Protein: 7.5 g/dL (ref 6.5–8.1)

## 2017-01-22 LAB — VITAMIN B12: Vitamin B-12: 1998 pg/mL — ABNORMAL HIGH (ref 180–914)

## 2017-01-22 LAB — TSH: TSH: 3.069 u[IU]/mL (ref 0.350–4.500)

## 2017-02-07 ENCOUNTER — Encounter
Admission: RE | Admit: 2017-02-07 | Discharge: 2017-02-07 | Disposition: A | Discharge status: Home / Self Care | Payer: Medicare Other | Source: Ambulatory Visit | Attending: Internal Medicine | Admitting: Internal Medicine

## 2017-02-09 ENCOUNTER — Encounter: Payer: Self-pay | Admitting: Gerontology

## 2017-02-09 ENCOUNTER — Non-Acute Institutional Stay (SKILLED_NURSING_FACILITY): Payer: Medicare Other | Admitting: Gerontology

## 2017-02-09 DIAGNOSIS — F3340 Major depressive disorder, recurrent, in remission, unspecified: Secondary | ICD-10-CM

## 2017-02-09 DIAGNOSIS — K861 Other chronic pancreatitis: Secondary | ICD-10-CM

## 2017-02-09 DIAGNOSIS — F39 Unspecified mood [affective] disorder: Secondary | ICD-10-CM | POA: Diagnosis not present

## 2017-02-09 DIAGNOSIS — G894 Chronic pain syndrome: Secondary | ICD-10-CM

## 2017-02-09 DIAGNOSIS — K219 Gastro-esophageal reflux disease without esophagitis: Secondary | ICD-10-CM

## 2017-02-09 DIAGNOSIS — R000 Tachycardia, unspecified: Secondary | ICD-10-CM

## 2017-02-09 DIAGNOSIS — F01518 Vascular dementia, unspecified severity, with other behavioral disturbance: Secondary | ICD-10-CM

## 2017-02-09 DIAGNOSIS — Z9181 History of falling: Secondary | ICD-10-CM | POA: Diagnosis not present

## 2017-02-09 DIAGNOSIS — D735 Infarction of spleen: Secondary | ICD-10-CM

## 2017-02-09 DIAGNOSIS — M810 Age-related osteoporosis without current pathological fracture: Secondary | ICD-10-CM

## 2017-02-09 DIAGNOSIS — G8929 Other chronic pain: Secondary | ICD-10-CM

## 2017-02-09 DIAGNOSIS — N401 Enlarged prostate with lower urinary tract symptoms: Secondary | ICD-10-CM

## 2017-02-09 DIAGNOSIS — F1097 Alcohol use, unspecified with alcohol-induced persisting dementia: Secondary | ICD-10-CM | POA: Diagnosis not present

## 2017-02-09 DIAGNOSIS — R32 Unspecified urinary incontinence: Secondary | ICD-10-CM

## 2017-02-09 DIAGNOSIS — F0151 Vascular dementia with behavioral disturbance: Secondary | ICD-10-CM

## 2017-02-09 DIAGNOSIS — N3281 Overactive bladder: Secondary | ICD-10-CM | POA: Diagnosis not present

## 2017-02-09 DIAGNOSIS — F419 Anxiety disorder, unspecified: Secondary | ICD-10-CM

## 2017-02-09 DIAGNOSIS — I5020 Unspecified systolic (congestive) heart failure: Secondary | ICD-10-CM

## 2017-02-09 DIAGNOSIS — R350 Frequency of micturition: Secondary | ICD-10-CM

## 2017-02-09 DIAGNOSIS — E538 Deficiency of other specified B group vitamins: Secondary | ICD-10-CM

## 2017-02-12 NOTE — Progress Notes (Signed)
Location:   [facility] Room Number: Parchment of Service:  SNF [phone]) Provider:  Toni Arthurs, NP-C  Kirk Ruths, MD  Patient Care Team: Kirk Ruths, MD as PCP - General (Unknown Physician Specialty) Paralee Cancel, MD as Consulting Physician (Orthopedic Surgery)  Extended Emergency Contact Information Primary Emergency Contact: Suzan Garibaldi Address: [address]          [place], [place] [address] [name] of [place] Phone: [phone] Mobile Phone: [phone] Relation: Sister  Code Status:  Full Goals of care: Advanced Directive information Advanced Directives 02/09/2017  Does Patient Have a Medical Advance Directive? No  Would patient like information on creating a medical advance directive? No - Patient declined  Pre-existing out of facility DNR order (yellow form or pink MOST form) -     Chief Complaint  Patient presents with  . Medical Management of Chronic Issues    Routine Visit    HPI:  Pt is a 66 y.o. male seen today for medical management of chronic diseases.  Patient has been stable over the past month.  Patient has not had any reported falls.  Patient is incontinent of bowel and bladder, wears adult briefs.  Patient is not able to consistently verbalize needs accurately.  [place]  If he is in need of something or in need to go to the bathroom, he typically just yells out "damn, damn!"  To get attention.  Otherwise, patient reports he is feeling fine.  No UTIs this month.  Vital signs stable.  No new complaints.  Past Medical History:  Diagnosis Date  . Alcoholic hepatitis 4970  . Anxiety   . Chronic pain   . 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 Depression, major, in remission ([place]) 05/10/2015  . Epistaxis 2001   required cauterization  . Fracture of left humerus   . GERD (gastroesophageal reflux disease)   .  History of alcoholism ([place])   . History of pancreatitis 05/10/2015  . Hypertonicity of bladder   . Lumbar spondylitis ([place])   . Osteoporosis 05/10/2015  . Stroke ([place])   . 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   . Thrombus of left atrial appendage 2003  . Urinary incontinence    Past Surgical History:  Procedure Laterality Date  . FEMUR IM NAIL Left 06/25/2012   Procedure: INTRAMEDULLARY (IM) NAIL FEMORAL;  Surgeon: Mauri Pole, MD;  Location: [facility];  Service: Orthopedics;  Laterality: Left;  . HAND SURGERY Bilateral 2003   crush injuries with multitrauma  . KNEE SURGERY  1996    Allergies  Allergen Reactions  . Cefazolin Rash  . Penicillins     REACTION: unknown allergy - hx since childhood    Allergies as of 02/09/2017      Reactions   Cefazolin Rash   Penicillins    REACTION: unknown allergy - hx since childhood      Medication List        Accurate as of 02/09/17 11:59 PM. Always use your most recent med list.          acetaminophen 500 MG tablet Commonly known as:  TYLENOL Take 2 tablets (1,000 mg total) by mouth 3 (three) times daily.   baclofen 10 MG tablet Commonly known as:  LIORESAL Take 10 mg by mouth 3 (three) times daily.   cholecalciferol 1000 units tablet Commonly known as:  VITAMIN  D Take 2,000 Units by mouth every morning. 2 tabs   cloNIDine 0.1 MG tablet Commonly known as:  CATAPRES Take 0.05 mg by mouth daily. 1/2 tablet   clopidogrel 75 MG tablet Commonly known as:  PLAVIX Take 75 mg by mouth every morning.   folic acid 638 MCG tablet Commonly known as:  FOLVITE Take 400 mcg by mouth daily.   gabapentin 100 MG capsule Commonly known as:  NEURONTIN Take 100 mg by mouth 3 (three) times daily.   lamoTRIgine 25 MG tablet Commonly known as:  LAMICTAL Take 50 mg by mouth 3 (three) times daily. 2 tabs   LORazepam 0.5 MG tablet Commonly known as:  ATIVAN Take 0.5 tablets (0.25 mg total) by mouth 2 (two) times daily. 1/2  tab; hold for sedation   magnesium hydroxide 400 MG/5ML suspension Commonly known as:  MILK OF MAGNESIA Take 30 mLs by mouth every 4 (four) hours as needed for mild constipation. Constipation / no BM for 2 days   Magnesium Oxide 250 MG Tabs Take 250 mg by mouth daily.   metoprolol succinate 25 MG 24 hr tablet Commonly known as:  TOPROL-XL Take 25 mg by mouth 2 (two) times daily.   pantoprazole 20 MG tablet Commonly known as:  PROTONIX Take 20 mg by mouth every morning.   polycarbophil 625 MG tablet Commonly known as:  FIBERCON Take 1,250 mg by mouth 2 (two) times daily. Take with 8 oz of water   potassium chloride 10 MEQ tablet Commonly known as:  K-DUR,KLOR-CON Take 20 mEq by mouth daily. 2 caps = 20 meq , May open and sprinkle on food   sennosides-docusate sodium 8.6-50 MG tablet Commonly known as:  SENOKOT-S Take 1 tablet by mouth 2 (two) times daily.   sertraline 100 MG tablet Commonly known as:  ZOLOFT Take 100 mg by mouth daily.   tamsulosin 0.4 MG Caps capsule Commonly known as:  FLOMAX Take 0.4 mg by mouth at bedtime.   traMADol 50 MG tablet Commonly known as:  ULTRAM Take 50 mg by mouth 2 (two) times daily as needed.   traMADol 50 MG tablet Commonly known as:  ULTRAM Take 1 tablet (50 mg total) by mouth 2 (two) times daily. Hold for sedation       Review of Systems  Unable to perform ROS: Dementia  Constitutional: Negative for activity change, appetite change, chills and fever.  HENT: Negative.   Respiratory: Negative.   Cardiovascular: Negative.   Gastrointestinal: Negative for abdominal distention and abdominal pain.  Genitourinary: Negative for difficulty urinating, dysuria, frequency, hematuria and urgency.  Musculoskeletal: Positive for arthralgias (typical arthritis). Negative for back pain, gait problem and myalgias.  Skin: Negative for color change, pallor, rash and wound.  Neurological: Negative for dizziness, tremors, syncope, speech  difficulty, weakness, numbness and headaches.  Psychiatric/Behavioral: Negative for agitation, behavioral problems and confusion.  All other systems reviewed and are negative.   Immunization History  Administered Date(s) Administered  . Influenza Whole 01/02/2010  . Influenza-Unspecified 12/04/2013, 11/24/2014, 12/05/2015, 12/04/2016  . PPD Test 12/21/2015, 12/08/2016  . Pneumococcal-Unspecified 10/14/2012  . Td 01/10/2010   Pertinent  Health Maintenance Due  Topic Date Due  . COLONOSCOPY  08/23/2000  . PNA vac Low Risk Adult (1 of 2 - PCV13) 08/24/2015  . INFLUENZA VACCINE  Completed   Fall Risk  07/27/2012  Falls in the past year? No   Functional Status Survey:    Vitals:   02/09/17 1110  BP: 113/65  Pulse:  80  Resp: 20  Temp: 97.8 F (36.6 C)  TempSrc: Oral  SpO2: 98%  Weight: 146 lb (66.2 kg)  Height: 5' 5"  (1.651 m)   Body mass index is 24.3 kg/m. Physical Exam  Constitutional: He is oriented to person, place, and time. Vital signs are normal. He appears well-developed and well-nourished. He is active and cooperative. He does not appear ill. No distress.  HENT:  Head: Normocephalic and atraumatic.  Mouth/Throat: Uvula is midline, oropharynx is clear and moist and mucous membranes are normal. Mucous membranes are not pale, not dry and not cyanotic.  Eyes: Conjunctivae, EOM and lids are normal. Pupils are equal, round, and reactive to light.  Neck: Trachea normal, normal range of motion and full passive range of motion without pain. Neck supple. No JVD present. No tracheal deviation, no edema and no erythema present. No thyromegaly present.  Cardiovascular: Normal rate, intact distal pulses and normal pulses. An irregular rhythm present. Exam reveals distant heart sounds. Exam reveals no gallop and no friction rub.  No murmur heard. Pulses:      Dorsalis pedis pulses are 2+ on the right side, and 2+ on the left side.  No edema  Pulmonary/Chest: Effort normal and  breath sounds normal. No accessory muscle usage. No respiratory distress. He has no wheezes. He has no rhonchi. He has no rales. He exhibits no tenderness.  Abdominal: Soft. Normal appearance and bowel sounds are normal. He exhibits no distension and no ascites. There is no tenderness.  Musculoskeletal: Normal range of motion. He exhibits no edema or tenderness.  Expected osteoarthritis, stiffness  Neurological: He is alert and oriented to person, place, and time. He has normal strength. He displays no tremor. He displays no seizure activity. Coordination and gait abnormal.  Skin: Skin is warm, dry and intact. No rash noted. He is not diaphoretic. No cyanosis or erythema. No pallor. Nails show no clubbing.  Psychiatric: His speech is normal. Thought content normal. His affect is blunt. Cognition and memory are impaired. He expresses impulsivity. He exhibits abnormal recent memory.  Nursing note and vitals reviewed.   Labs reviewed: Recent Labs    07/08/16 0809 01/22/17 0812  NA 138 138  K 4.3 4.4  CL 103 103  CO2 29 27  GLUCOSE 86 95  BUN 25* 31*  CREATININE 0.94 0.98  CALCIUM 9.6 9.6  MG 2.0  --    Recent Labs    07/08/16 0809 01/22/17 0812  AST 17 14*  ALT 18 15*  ALKPHOS 44 51  BILITOT 0.7 0.6  PROT 8.0 7.5  ALBUMIN 4.8 4.3   Recent Labs    07/08/16 0809 01/22/17 0812  WBC 5.3 6.3  NEUTROABS 3.1 3.6  HGB 14.0 13.6  HCT 43.0 41.8  MCV 88.4 89.4  PLT 141* 131*   Lab Results  Component Value Date   TSH 3.069 01/22/2017   No results found for: HGBA1C Lab Results  Component Value Date   CHOL 86 07/12/2012   HDL 51 01/08/2010   LDLCALC 126 (H) 01/08/2010   TRIG 88 07/12/2012   CHOLHDL 3.8 Ratio 01/08/2010    Significant Diagnostic Results in last 30 days:  No results found.  Assessment/Plan 1. 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 Major depressive disorder, recurrent, in remission (HCC)  Stable  Continue sertraline  100 mg by mouth daily  3. Mood disorder (HCC)  Stable  Continue Lamictal 25 mg tablets 2 tablets 3 times a day for  mood disorder  4. Vascular dementia with behavioral disturbance  Stable  Continue clonidine 0.1 mg one half tablet by mouth daily for verbal outbursts  5. Age-related osteoporosis without current pathological fracture  Stable  Continue cholecalciferol 1000 units 2 tablets by mouth daily  6. Hx of falling  Stable  7. Other chronic pancreatitis (HCC)  Stable  8. Infarction of spleen  Stable  9. Tachycardia, unspecified  Stable  Continue metoprolol 25 mg by mouth twice a day  10. Other chronic pain  Stable stable  Continue baclofen 10 mg by mouth 3 times a day for leg cramps and spasms  Continue tramadol 50 mg by mouth twice a day for breakthrough pain  Continue Tylenol 1000 mg by mouth 3 times a day  11. Systolic congestive heart failure, unspecified HF chronicity (HCC)  Stable  Continue Plavix 75 mg by mouth daily  Continue potassium chloride 20 mEq by mouth daily  12. Overactive bladder  Stable  13. Gastroesophageal reflux disease without esophagitis  Stable  Continue Fiber-Lax 25 mg 2 tablets by mouth twice a day  Continue protonix 20 mg by mouth daily- attempted to DC med- failed attempt  Continue senna S 1 tablet by mouth twice a day  14. Anxiety disorder, unspecified type  Stable  Continue lorazepam 0.5 mg one half tablet BID- Attempting GDR  15. Chronic pain syndrome  Stable  Continue gabapentin 100 mg by mouth 3 times a day  16. Urinary incontinence, unspecified type  Stable  Frequent monitoring to ensure dry brief  17. Hypomagnesemia  Stable  Continue Magnesium Oxide 250 mg po Q Day  18. Benign prostatic hyperplasia with urinary frequency  Stable  Continue Tamsulosin 0.4 mg po Q HS  19. Vitamin B12 deficiency  Stable  Continue Folic acid 668 mcg po Q Day  Continue all medications as listed  above (02/09/17)  Family/ staff Communication:   Total Time:  Documentation:  Face to Face:  Family/Phone:   Labs/tests ordered: CBC, met C, vitamin B12, TSH prior to next month's visit  Medication list reviewed and assessed for continued appropriateness. Monthly medication orders reviewed and signed.  Vikki Ports, NP-C Geriatrics [facility] [phone] [address] [place],  [address] Cell Phone (Mon-Fri 8am-5pm):  [phone] On Call:  [phone] & follow prompts after 5pm & weekends Office Phone:  [phone] Office Fax:  [phone]

## 2017-03-09 ENCOUNTER — Other Ambulatory Visit: Payer: Self-pay

## 2017-03-09 MED ORDER — TRAMADOL HCL 50 MG PO TABS
50.0000 mg | ORAL_TABLET | Freq: Two times a day (BID) | ORAL | 3 refills | Status: DC
Start: 2017-03-09 — End: 2017-07-08

## 2017-03-09 NOTE — Telephone Encounter (Signed)
Rx sent to [facility] phone : [phone] , fax : [phone]  

## 2017-03-10 ENCOUNTER — Encounter
Admission: RE | Admit: 2017-03-10 | Discharge: 2017-03-10 | Disposition: A | Discharge status: Home / Self Care | Payer: Medicare Other | Source: Ambulatory Visit | Attending: Internal Medicine | Admitting: Internal Medicine

## 2017-03-11 ENCOUNTER — Encounter: Payer: Self-pay | Admitting: Gerontology

## 2017-03-11 ENCOUNTER — Non-Acute Institutional Stay (SKILLED_NURSING_FACILITY): Payer: Medicare Other | Admitting: Gerontology

## 2017-03-11 DIAGNOSIS — I81 Portal vein thrombosis: Secondary | ICD-10-CM | POA: Diagnosis not present

## 2017-03-11 DIAGNOSIS — I959 Hypotension, unspecified: Secondary | ICD-10-CM | POA: Diagnosis not present

## 2017-03-11 DIAGNOSIS — K55069 Acute infarction of intestine, part and extent unspecified: Secondary | ICD-10-CM

## 2017-03-11 DIAGNOSIS — I5020 Unspecified systolic (congestive) heart failure: Secondary | ICD-10-CM

## 2017-03-11 DIAGNOSIS — IMO0002 Reserved for concepts with insufficient information to code with codable children: Secondary | ICD-10-CM

## 2017-03-29 NOTE — Assessment & Plan Note (Signed)
Stable. No edema. No chest pain or shortness of breath

## 2017-03-29 NOTE — Progress Notes (Signed)
[facility] Room Number: 323A Place of Service:  SNF (31) Provider:  Lorenso Quarry, NP-C  Lauro Regulus, MD  Patient Care Team: Lauro Regulus, MD as PCP - General (Unknown Physician Specialty) Durene Romans, MD as Consulting Physician (Orthopedic Surgery)  Extended Emergency Contact Information Primary Emergency Contact: Tula Nakayama Address: [address]          [place], [place] [address] Darden Amber of [place] Home Phone: [phone] Mobile Phone: [phone] Relation: Sister  Code Status:  FULL Goals of care: Advanced Directive information Advanced Directives 03/11/2017  Does Patient Have a Medical Advance Directive? Yes  Does patient want to make changes to medical advance directive? No - Patient declined  Would patient like information on creating a medical advance directive? -  Pre-existing out of facility DNR order (yellow form or pink MOST form) -     Chief Complaint  Patient presents with  . Medical Management of Chronic Issues    Routine Visit    HPI:  Steven [name] is a 67 y.o. male seen today for medical management of chronic diseases.    Systolic congestive heart failure (HCC) Stable. No edema. No chest pain or shortness of breath  Superior mesenteric vein thrombosis Stable. Continues on Plavix 75 mg po Q Day  HYPOTENSION Stable. No recent episodes of hypotension  Please note Steven [name] with limited verbal ability. Unable to obtain complete ROS. Some ROS info obtained from staff and documentation.   Past Medical History:  Diagnosis Date  . Alcoholic hepatitis 2001  . Anxiety   . Chronic pain   . 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 Depression, major, in remission (HCC) 05/10/2015  . Epistaxis 2001   required cauterization  . Fracture of left humerus   . GERD (gastroesophageal reflux disease)   . History of alcoholism (HCC)   . History of pancreatitis 05/10/2015  . Hypertonicity of  bladder   . Lumbar spondylitis (HCC)   . Osteoporosis 05/10/2015  . Stroke (HCC)   . 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   . Thrombus of left atrial appendage 2003  . Urinary incontinence    Past Surgical History:  Procedure Laterality Date  . FEMUR IM NAIL Left 06/25/2012   Procedure: INTRAMEDULLARY (IM) NAIL FEMORAL;  Surgeon: Shelda Pal, MD;  Location: [facility];  Service: Orthopedics;  Laterality: Left;  . HAND SURGERY Bilateral 2003   crush injuries with multitrauma  . KNEE SURGERY  1996    Allergies  Allergen Reactions  . Cefazolin Rash  . Penicillins     REACTION: unknown allergy - hx since childhood    Allergies as of 03/11/2017      Reactions   Cefazolin Rash   Penicillins    REACTION: unknown allergy - hx since childhood      Medication List        Accurate as of 03/11/17 11:59 PM. Always use your most recent med list.          acetaminophen 500 MG tablet Commonly known as:  TYLENOL Take 2 tablets (1,000 mg total) by mouth 3 (three) times daily.   baclofen 10 MG tablet Commonly known as:  LIORESAL Take 10 mg by mouth 3 (three) times daily.   cholecalciferol 1000 units tablet Commonly known as:  VITAMIN D Take 2,000 Units by mouth every morning. 2 tabs   cloNIDine 0.1 MG tablet Commonly known as:  CATAPRES Take 0.05 mg by mouth  daily. 1/2 tablet   clopidogrel 75 MG tablet Commonly known as:  PLAVIX Take 75 mg by mouth every morning.   folic acid 400 MCG tablet Commonly known as:  FOLVITE Take 400 mcg by mouth daily.   gabapentin 100 MG capsule Commonly known as:  NEURONTIN Take 100 mg by mouth 3 (three) times daily.   lamoTRIgine 25 MG tablet Commonly known as:  LAMICTAL Take 50 mg by mouth 3 (three) times daily. 2 tabs   LORazepam 0.5 MG tablet Commonly known as:  ATIVAN Take 0.5 tablets (0.25 mg total) by mouth 2 (two) times daily. 1/2 tab; hold for sedation   magnesium hydroxide 400 MG/5ML suspension Commonly known as:   MILK OF MAGNESIA Take 30 mLs by mouth every 4 (four) hours as needed for mild constipation. Constipation / no BM for 2 days   Magnesium Oxide 250 MG Tabs Take 250 mg by mouth daily.   metoprolol succinate 25 MG 24 hr tablet Commonly known as:  TOPROL-XL Take 25 mg by mouth 2 (two) times daily.   pantoprazole 20 MG tablet Commonly known as:  PROTONIX Take 20 mg by mouth every morning.   polycarbophil 625 MG tablet Commonly known as:  FIBERCON Take 1,250 mg by mouth 2 (two) times daily. Take with 8 oz of water   potassium chloride 10 MEQ tablet Commonly known as:  K-DUR,KLOR-CON Take 20 mEq by mouth daily. 2 caps = 20 meq , May open and sprinkle on food   sennosides-docusate sodium 8.6-50 MG tablet Commonly known as:  SENOKOT-S Take 1 tablet by mouth 2 (two) times daily.   sertraline 100 MG tablet Commonly known as:  ZOLOFT Take 100 mg by mouth daily.   tamsulosin 0.4 MG Caps capsule Commonly known as:  FLOMAX Take 0.4 mg by mouth at bedtime.   traMADol 50 MG tablet Commonly known as:  ULTRAM Take 50 mg by mouth 2 (two) times daily as needed.   traMADol 50 MG tablet Commonly known as:  ULTRAM Take 1 tablet (50 mg total) by mouth 2 (two) times daily. Hold for sedation       Review of Systems  Unable to perform ROS: Patient nonverbal  Constitutional: Negative for activity change, appetite change, chills, diaphoresis and fever.  HENT: Negative for congestion, mouth sores, nosebleeds, postnasal drip, sneezing, sore throat, trouble swallowing and voice change.   Respiratory: Negative for apnea, cough, choking, chest tightness, shortness of breath and wheezing.   Cardiovascular: Negative for chest pain, palpitations and leg swelling.  Gastrointestinal: Negative for abdominal distention, abdominal pain, constipation, diarrhea and nausea.  Genitourinary: Negative for difficulty urinating, dysuria, frequency and urgency.  Musculoskeletal: Positive for arthralgias (typical  arthritis). Negative for back pain, gait problem and myalgias.  Skin: Negative for color change, pallor, rash and wound.  Neurological: Negative for dizziness, tremors, syncope, speech difficulty, weakness, numbness and headaches.  Psychiatric/Behavioral: Positive for agitation (at times) and confusion. Negative for behavioral problems.  All other systems reviewed and are negative.   Immunization History  Administered Date(s) Administered  . Influenza Whole 01/02/2010  . Influenza-Unspecified 12/04/2013, 11/24/2014, 12/05/2015, 12/04/2016  . PPD Test 12/21/2015, 12/08/2016  . Pneumococcal-Unspecified 10/14/2012  . Td 01/10/2010   Pertinent  Health Maintenance Due  Topic Date Due  . COLONOSCOPY  08/23/2000  . PNA vac Low Risk Adult (1 of 2 - PCV13) 08/24/2015  . INFLUENZA VACCINE  Completed   Fall Risk  07/27/2012  Falls in the past year? No   Functional Status  Survey:    Vitals:   03/11/17 1259  BP: 103/66  Pulse: 70  Resp: 20  Temp: 97.7 F (36.5 C)  TempSrc: Oral  SpO2: 100%  Weight: 142 lb (64.4 kg)  Height: 5\' 5"  (1.651 m)   Body mass index is 23.63 kg/m. Physical Exam  Constitutional: Vital signs are normal. Steven [name] appears well-developed and well-nourished. Steven [name] is active and cooperative. Steven [name] does not appear ill. No distress.  HENT:  Head: Normocephalic and atraumatic.  Mouth/Throat: Uvula is midline, oropharynx is clear and moist and mucous membranes are normal. Mucous membranes are not pale, not dry and not cyanotic.  Eyes: Conjunctivae, EOM and lids are normal. Pupils are equal, round, and reactive to light.  Neck: Trachea normal, normal range of motion and full passive range of motion without pain. Neck supple. No JVD present. No tracheal deviation, no edema and no erythema present. No thyromegaly present.  Cardiovascular: Normal rate, regular rhythm, normal heart sounds, intact distal pulses and normal pulses. Exam reveals no gallop, no distant heart sounds and no  friction rub.  No murmur heard. Pulses:      Dorsalis pedis pulses are 2+ on the right side, and 2+ on the left side.  No edema  Pulmonary/Chest: Effort normal and breath sounds normal. No accessory muscle usage. No respiratory distress. Steven [name] has no decreased breath sounds. Steven [name] has no wheezes. Steven [name] has no rhonchi. Steven [name] has no rales. Steven [name] exhibits no tenderness.  Abdominal: Soft. Normal appearance and bowel sounds are normal. Steven [name] exhibits no distension and no ascites. There is no tenderness.  Musculoskeletal: Normal range of motion. Steven [name] exhibits no edema or tenderness.  Expected osteoarthritis, stiffness; Bilateral Calves soft, supple. Negative Homan's Sign. B- pedal pulses equal; generalized weakness, total lift required from bed to chair  Neurological: Steven [name] is alert. Steven [name] has normal strength. Steven [name] displays atrophy. A cranial nerve deficit and sensory deficit is present. Steven [name] exhibits abnormal muscle tone. Coordination and gait abnormal.  Skin: Skin is warm, dry and intact. Steven [name] is not diaphoretic. No cyanosis. No pallor. Nails show no clubbing.  Psychiatric: His speech is normal and behavior is normal. Thought content normal. His affect is blunt. Cognition and memory are impaired. Steven [name] expresses impulsivity and inappropriate judgment. Steven [name] exhibits abnormal recent memory and abnormal remote memory.  Nursing note and vitals reviewed.   Labs reviewed: Recent Labs    07/08/16 0809 01/22/17 0812  NA 138 138  K 4.3 4.4  CL 103 103  CO2 29 27  GLUCOSE 86 95  BUN 25* 31*  CREATININE 0.94 0.98  CALCIUM 9.6 9.6  MG 2.0  --    Recent Labs    07/08/16 0809 01/22/17 0812  AST 17 14*  ALT 18 15*  ALKPHOS 44 51  BILITOT 0.7 0.6  PROT 8.0 7.5  ALBUMIN 4.8 4.3   Recent Labs    07/08/16 0809 01/22/17 0812  WBC 5.3 6.3  NEUTROABS 3.1 3.6  HGB 14.0 13.6  HCT 43.0 41.8  MCV 88.4 89.4  PLT 141* 131*   Lab Results  Component Value Date   TSH 3.069 01/22/2017   No results found for: HGBA1C Lab Results    Component Value Date   CHOL 86 07/12/2012   HDL 51 01/08/2010   LDLCALC 126 (H) 01/08/2010   TRIG 88 07/12/2012   CHOLHDL 3.8 Ratio 01/08/2010    Significant Diagnostic Results in last 30 days:  No results found.  Assessment/Plan Steven [name] was seen today for medical management of chronic  issues.  Diagnoses and all orders for this visit:  Systolic congestive heart failure, unspecified HF chronicity (HCC)  Superior mesenteric vein thrombosis  Hypotension, unspecified hypotension type   Above stated conditions stable  Continue current medication regimen  Monitor for increased edema and dyspnea  Family/ staff Communication:   Total Time:  Documentation:  Face to Face:  Family/Phone:   Labs/tests ordered:  Not due- labs obtained last month reviewed  Medication list reviewed and assessed for continued appropriateness. Monthly medication orders reviewed and signed.  Brynda RimShannon H. Logyn Dedominicis, NP-C Geriatrics [facility] [phone] [address] [place], [place] [address] Cell Phone (Mon-Fri 8am-5pm):  [phone] On Call:  [phone] & follow prompts after 5pm & weekends Office Phone:  [phone] Office Fax:  [phone]

## 2017-03-29 NOTE — Assessment & Plan Note (Signed)
Stable. No recent episodes of hypotension

## 2017-03-29 NOTE — Assessment & Plan Note (Signed)
Stable. Continues on Plavix 75 mg po Q Day

## 2017-04-10 ENCOUNTER — Non-Acute Institutional Stay (SKILLED_NURSING_FACILITY): Payer: Medicare Other | Admitting: Gerontology

## 2017-04-10 ENCOUNTER — Encounter: Payer: Self-pay | Admitting: Gerontology

## 2017-04-10 ENCOUNTER — Encounter
Admission: RE | Admit: 2017-04-10 | Discharge: 2017-04-10 | Disposition: A | Discharge status: Home / Self Care | Payer: Medicare Other | Source: Ambulatory Visit | Attending: Internal Medicine | Admitting: Internal Medicine

## 2017-04-10 DIAGNOSIS — I6350 Cerebral infarction due to unspecified occlusion or stenosis of unspecified cerebral artery: Secondary | ICD-10-CM

## 2017-04-10 DIAGNOSIS — K8689 Other specified diseases of pancreas: Secondary | ICD-10-CM | POA: Diagnosis not present

## 2017-04-10 DIAGNOSIS — K219 Gastro-esophageal reflux disease without esophagitis: Secondary | ICD-10-CM

## 2017-04-22 ENCOUNTER — Other Ambulatory Visit: Payer: Self-pay

## 2017-04-22 MED ORDER — LORAZEPAM 0.5 MG PO TABS: 0.2500 mg | ORAL_TABLET | Freq: Two times a day (BID) | ORAL | 3 refills | Status: DC

## 2017-04-22 NOTE — Telephone Encounter (Signed)
Rx sent to [facility] phone : [phone] , fax : [phone]  

## 2017-05-08 ENCOUNTER — Encounter
Admission: RE | Admit: 2017-05-08 | Discharge: 2017-05-08 | Disposition: A | Discharge status: Home / Self Care | Payer: Medicare Other | Source: Ambulatory Visit | Attending: Internal Medicine | Admitting: Internal Medicine

## 2017-05-14 ENCOUNTER — Non-Acute Institutional Stay (SKILLED_NURSING_FACILITY): Payer: Medicare Other | Admitting: Gerontology

## 2017-05-14 ENCOUNTER — Encounter: Payer: Self-pay | Admitting: Gerontology

## 2017-05-14 DIAGNOSIS — K861 Other chronic pancreatitis: Secondary | ICD-10-CM | POA: Diagnosis not present

## 2017-05-14 DIAGNOSIS — F0151 Vascular dementia with behavioral disturbance: Secondary | ICD-10-CM | POA: Diagnosis not present

## 2017-05-14 DIAGNOSIS — F1097 Alcohol use, unspecified with alcohol-induced persisting dementia: Secondary | ICD-10-CM | POA: Diagnosis not present

## 2017-05-14 DIAGNOSIS — F01518 Vascular dementia, unspecified severity, with other behavioral disturbance: Secondary | ICD-10-CM

## 2017-05-17 NOTE — Progress Notes (Signed)
Location:    Nursing Home Room Number: 323A Place of Service:  SNF (31) Provider:  Lorenso Quarry, NP-C  Lauro Regulus, MD  Patient Care Team: Lauro Regulus, MD as PCP - General (Unknown Physician Specialty) Durene Romans, MD as Consulting Physician (Orthopedic Surgery)  Extended Emergency Contact Information Primary Emergency Contact: Steven [name] Address: [address]          [place], [place] [address] Steven [name] of [place] Home Phone: [phone] Mobile Phone: [phone] Relation: Sister  Code Status:  FULL Goals of care: Advanced Directive information Advanced Directives 05/14/2017  Does Patient Have a Medical Advance Directive? No  Does patient want to make changes to medical advance directive? -  Would patient like information on creating a medical advance directive? No - Patient declined  Pre-existing out of facility DNR order (yellow form or pink MOST form) -     Chief Complaint  Patient presents with  . Medical Management of Chronic Issues    Routine Visit    HPI:  Pt is a 67 y.o. male seen today for medical management of chronic diseases.    Chronic pancreatitis (HCC) Stable. No episodes of pancreatitis since admission. No alcohol intake  Vascular dementia with behavioral disturbance Stable. Predictable. Pt is minimally verbal. Communicates "needs" by yelling out "damn, damn". He is cooperative, somewhat re-directable. Unable to perform ADLs/ self-care.  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 No alcohol intake since admission. Unable to perform ADLs or accurately verbalize needs.   Please note pt with limited verbal ability. Unable to obtain complete ROS. Some ROS info obtained from staff and documentation.   Past Medical History:  Diagnosis Date  . Alcoholic hepatitis 2001  . Anxiety   . Chronic pain   . 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 Depression,  major, in remission (HCC) 05/10/2015  . Epistaxis 2001   required cauterization  . Fracture of left humerus   . GERD (gastroesophageal reflux disease)   . History of alcoholism (HCC)   . History of pancreatitis 05/10/2015  . Hypertonicity of bladder   . Lumbar spondylitis (HCC)   . Osteoporosis 05/10/2015  . Stroke (HCC)   . 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   . Thrombus of left atrial appendage 2003  . Urinary incontinence    Past Surgical History:  Procedure Laterality Date  . FEMUR IM NAIL Left 06/25/2012   Procedure: INTRAMEDULLARY (IM) NAIL FEMORAL;  Surgeon: Shelda Pal, MD;  Location: [facility];  Service: Orthopedics;  Laterality: Left;  . HAND SURGERY Bilateral 2003   crush injuries with multitrauma  . KNEE SURGERY  1996    Allergies  Allergen Reactions  . Cefazolin Rash  . Penicillins     REACTION: unknown allergy - hx since childhood    Allergies as of 05/14/2017      Reactions   Cefazolin Rash   Penicillins    REACTION: unknown allergy - hx since childhood      Medication List        Accurate as of 05/14/17 11:59 PM. Always use your most recent med list.          acetaminophen 500 MG tablet Commonly known as:  TYLENOL Take 2 tablets (1,000 mg total) by mouth 3 (three) times daily.   baclofen 10 MG tablet Commonly known as:  LIORESAL Take 10 mg by mouth 3 (three) times daily.   cholecalciferol 1000 units tablet Commonly known as:  VITAMIN D Take 2,000 Units by mouth every morning. 2 tabs   cloNIDine 0.1 MG tablet Commonly known as:  CATAPRES Take 0.05 mg by mouth daily. 1/2 tablet   clopidogrel 75 MG tablet Commonly known as:  PLAVIX Take 75 mg by mouth every morning.   folic acid 400 MCG tablet Commonly known as:  FOLVITE Take 400 mcg by mouth daily.   gabapentin 100 MG capsule Commonly known as:  NEURONTIN Take 100 mg by mouth 3 (three) times daily.   lamoTRIgine 25 MG tablet Commonly known as:  LAMICTAL Take 25 mg by  mouth 3 (three) times daily.   LORazepam 0.5 MG tablet Commonly known as:  ATIVAN Take 0.5 tablets (0.25 mg total) by mouth 2 (two) times daily. 1/2 tab; hold for sedation   magnesium hydroxide 400 MG/5ML suspension Commonly known as:  MILK OF MAGNESIA Take 30 mLs by mouth every 4 (four) hours as needed for mild constipation. Constipation / no BM for 2 days   Magnesium Oxide 250 MG Tabs Take 250 mg by mouth daily.   metoprolol succinate 25 MG 24 hr tablet Commonly known as:  TOPROL-XL Take 25 mg by mouth 2 (two) times daily.   pantoprazole 20 MG tablet Commonly known as:  PROTONIX Take 20 mg by mouth every morning.   polycarbophil 625 MG tablet Commonly known as:  FIBERCON Take 1,250 mg by mouth 2 (two) times daily. Take with 8 oz of water   potassium chloride 10 MEQ tablet Commonly known as:  K-DUR,KLOR-CON Take 20 mEq by mouth daily. 2 caps = 20 meq , May open and sprinkle on food   sennosides-docusate sodium 8.6-50 MG tablet Commonly known as:  SENOKOT-S Take 1 tablet by mouth 2 (two) times daily.   sertraline 100 MG tablet Commonly known as:  ZOLOFT Take 100 mg by mouth daily.   tamsulosin 0.4 MG Caps capsule Commonly known as:  FLOMAX Take 0.4 mg by mouth at bedtime.   traMADol 50 MG tablet Commonly known as:  ULTRAM Take 50 mg by mouth 2 (two) times daily as needed.   traMADol 50 MG tablet Commonly known as:  ULTRAM Take 1 tablet (50 mg total) by mouth 2 (two) times daily. Hold for sedation       Review of Systems  Unable to perform ROS: Dementia  Constitutional: Negative for activity change, appetite change, chills, diaphoresis and fever.  HENT: Negative for congestion, mouth sores, nosebleeds, postnasal drip, sneezing, sore throat, trouble swallowing and voice change.   Respiratory: Negative for apnea, cough, choking, chest tightness, shortness of breath and wheezing.   Cardiovascular: Negative for chest pain, palpitations and leg swelling.    Gastrointestinal: Negative for abdominal distention, abdominal pain, constipation, diarrhea and nausea.  Genitourinary: Negative for difficulty urinating, dysuria, frequency and urgency.  Musculoskeletal: Positive for arthralgias (typical arthritis). Negative for back pain, gait problem and myalgias.  Skin: Negative for color change, pallor, rash and wound.  Neurological: Positive for weakness. Negative for dizziness, tremors, syncope, speech difficulty, numbness and headaches.  Psychiatric/Behavioral: Positive for agitation and behavioral problems (at times).  All other systems reviewed and are negative.   Immunization History  Administered Date(s) Administered  . Influenza Whole 01/02/2010  . Influenza-Unspecified 12/04/2013, 11/24/2014, 12/05/2015, 12/04/2016  . PPD Test 12/21/2015, 12/08/2016  . Pneumococcal-Unspecified 10/14/2012  . Td 01/10/2010   Pertinent  Health Maintenance Due  Topic Date Due  . COLONOSCOPY  08/23/2000  . PNA vac Low Risk Adult (1 of 2 - PCV13)  08/24/2015  . INFLUENZA VACCINE  Completed   Fall Risk  07/27/2012  Falls in the past year? No   Functional Status Survey:    Vitals:   05/14/17 1534  BP: 140/72  Pulse: 85  Resp: 20  Temp: 98.7 F (37.1 C)  TempSrc: Oral  SpO2: 96%  Weight: 145 lb 4.8 oz (65.9 kg)  Height: 5\' 5"  (1.651 m)   Body mass index is 24.18 kg/m. Physical Exam  Constitutional: Vital signs are normal. He appears well-developed and well-nourished. He is active and cooperative. He does not appear ill. No distress.  HENT:  Head: Normocephalic and atraumatic.  Mouth/Throat: Uvula is midline, oropharynx is clear and moist and mucous membranes are normal. Mucous membranes are not pale, not dry and not cyanotic.  Eyes: Conjunctivae, EOM and lids are normal. Pupils are equal, round, and reactive to light.  Neck: Trachea normal, normal range of motion and full passive range of motion without pain. Neck supple. No JVD present. No  tracheal deviation, no edema and no erythema present. No thyromegaly present.  Cardiovascular: Normal rate, regular rhythm, normal heart sounds, intact distal pulses and normal pulses. Exam reveals no gallop, no distant heart sounds and no friction rub.  No murmur heard. Pulses:      Dorsalis pedis pulses are 2+ on the right side, and 2+ on the left side.  No edema  Pulmonary/Chest: Effort normal and breath sounds normal. No accessory muscle usage. No respiratory distress. He has no decreased breath sounds. He has no wheezes. He has no rhonchi. He has no rales. He exhibits no tenderness.  Abdominal: Soft. Normal appearance and bowel sounds are normal. He exhibits no distension and no ascites. There is no tenderness.  Musculoskeletal: Normal range of motion. He exhibits no edema or tenderness.  Expected osteoarthritis, stiffness; Bilateral Calves soft, supple. Negative Homan's Sign. B- pedal pulses equal; generalized weakness; mobile via mechanical lift/ non-ambulatory  Neurological: He is alert. He has normal strength. He displays atrophy. Coordination and gait abnormal.  Skin: Skin is warm, dry and intact. He is not diaphoretic. No cyanosis. No pallor. Nails show no clubbing.  Psychiatric: He has a normal mood and affect. His speech is normal. Thought content normal. He is agitated (at times). Cognition and memory are impaired. He expresses impulsivity and inappropriate judgment. He exhibits abnormal recent memory and abnormal remote memory.  Nursing note and vitals reviewed.   Labs reviewed: Recent Labs    07/08/16 0809 01/22/17 0812  NA 138 138  K 4.3 4.4  CL 103 103  CO2 29 27  GLUCOSE 86 95  BUN 25* 31*  CREATININE 0.94 0.98  CALCIUM 9.6 9.6  MG 2.0  --    Recent Labs    07/08/16 0809 01/22/17 0812  AST 17 14*  ALT 18 15*  ALKPHOS 44 51  BILITOT 0.7 0.6  PROT 8.0 7.5  ALBUMIN 4.8 4.3   Recent Labs    07/08/16 0809 01/22/17 0812  WBC 5.3 6.3  NEUTROABS 3.1 3.6  HGB  14.0 13.6  HCT 43.0 41.8  MCV 88.4 89.4  PLT 141* 131*   Lab Results  Component Value Date   TSH 3.069 01/22/2017   No results found for: HGBA1C Lab Results  Component Value Date   CHOL 86 07/12/2012   HDL 51 01/08/2010   LDLCALC 126 (H) 01/08/2010   TRIG 88 07/12/2012   CHOLHDL 3.8 Ratio 01/08/2010    Significant Diagnostic Results in last 30 days:  No  results found.  Assessment/Plan Steven [name] was seen today for medical management of chronic issues.  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 Monthly medication orders reviewed and signed.  Brynda RimShannon H. Suren Payne, NP-C [facility] [phone] [address] [place], [place] [address] Cell Phone (Mon-Fri 8am-5pm):  [phone] On Call:  [phone] & follow prompts after 5pm & weekends Office Phone:  [phone] Office Fax:  [phone]

## 2017-05-17 NOTE — Assessment & Plan Note (Signed)
Stable. Predictable. Pt is minimally verbal. Communicates "needs" by yelling out "damn, damn". He is cooperative, somewhat re-directable. Unable to perform ADLs/ self-care.

## 2017-05-17 NOTE — Assessment & Plan Note (Signed)
Stable. No episodes of pancreatitis since admission. No alcohol intake

## 2017-05-17 NOTE — Progress Notes (Signed)
Location:    Nursing Home Room Number: 323A Place of Service:  SNF (31) Provider:  Lorenso Quarry, NP-C  Lauro Regulus, MD  Patient Care Team: Lauro Regulus, MD as PCP - General (Unknown Physician Specialty) Durene Romans, MD as Consulting Physician (Orthopedic Surgery)  Extended Emergency Contact Information Primary Emergency Contact: Tula Nakayama Address: [address]          [place], [place] [address] Darden Amber of [place] Home Phone: [phone] Mobile Phone: [phone] Relation: Sister  Code Status: FULL Goals of care: Advanced Directive information Advanced Directives 05/14/2017  Does Patient Have a Medical Advance Directive? No  Does patient want to make changes to medical advance directive? -  Would patient like information on creating a medical advance directive? No - Patient declined  Pre-existing out of facility DNR order (yellow form or pink MOST form) -     Chief Complaint  Patient presents with  . Medical Management of Chronic Issues    Routine Visit    HPI:  Pt is a 67 y.o. male seen today for medical management of chronic diseases.    Cerebral artery occlusion with cerebral infarction (HCC) Stable. No indication of subsequent CVA. Not on anticoagulant d/t fall risk  Pancreatic necrosis Stable. No medicinal treatment  Gastroesophageal reflux disease without esophagitis Stable. Symptoms managed on Protonix 20 mg po Q Day.   Please note pt with limited verbal ability. Unable to obtain complete ROS. Some ROS info obtained from staff and documentation.   Past Medical History:  Diagnosis Date  . Alcoholic hepatitis 2001  . Anxiety   . Chronic pain   . 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 Depression, major, in remission (HCC) 05/10/2015  . Epistaxis 2001   required cauterization  . Fracture of left humerus   . GERD (gastroesophageal reflux disease)   . History of alcoholism (HCC)     . History of pancreatitis 05/10/2015  . Hypertonicity of bladder   . Lumbar spondylitis (HCC)   . Osteoporosis 05/10/2015  . Stroke (HCC)   . 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   . Thrombus of left atrial appendage 2003  . Urinary incontinence    Past Surgical History:  Procedure Laterality Date  . FEMUR IM NAIL Left 06/25/2012   Procedure: INTRAMEDULLARY (IM) NAIL FEMORAL;  Surgeon: Shelda Pal, MD;  Location: [facility];  Service: Orthopedics;  Laterality: Left;  . HAND SURGERY Bilateral 2003   crush injuries with multitrauma  . KNEE SURGERY  1996    Allergies  Allergen Reactions  . Cefazolin Rash  . Penicillins     REACTION: unknown allergy - hx since childhood    Allergies as of 04/10/2017      Reactions   Cefazolin Rash   Penicillins    REACTION: unknown allergy - hx since childhood      Medication List        Accurate as of 04/10/17 11:59 PM. Always use your most recent med list.          acetaminophen 500 MG tablet Commonly known as:  TYLENOL Take 2 tablets (1,000 mg total) by mouth 3 (three) times daily.   baclofen 10 MG tablet Commonly known as:  LIORESAL Take 10 mg by mouth 3 (three) times daily.   cholecalciferol 1000 units tablet Commonly known as:  VITAMIN D Take 2,000 Units by mouth every morning. 2 tabs   cloNIDine 0.1 MG tablet Commonly known as:  CATAPRES Take 0.05 mg by mouth daily. 1/2 tablet   clopidogrel 75 MG tablet Commonly known as:  PLAVIX Take 75 mg by mouth every morning.   folic acid 400 MCG tablet Commonly known as:  FOLVITE Take 400 mcg by mouth daily.   gabapentin 100 MG capsule Commonly known as:  NEURONTIN Take 100 mg by mouth 3 (three) times daily.   lamoTRIgine 25 MG tablet Commonly known as:  LAMICTAL Take 25 mg by mouth 3 (three) times daily.   LORazepam 0.5 MG tablet Commonly known as:  ATIVAN Take 0.5 tablets (0.25 mg total) by mouth 2 (two) times daily. 1/2 tab; hold for sedation   magnesium  hydroxide 400 MG/5ML suspension Commonly known as:  MILK OF MAGNESIA Take 30 mLs by mouth every 4 (four) hours as needed for mild constipation. Constipation / no BM for 2 days   Magnesium Oxide 250 MG Tabs Take 250 mg by mouth daily.   metoprolol succinate 25 MG 24 hr tablet Commonly known as:  TOPROL-XL Take 25 mg by mouth 2 (two) times daily.   pantoprazole 20 MG tablet Commonly known as:  PROTONIX Take 20 mg by mouth every morning.   polycarbophil 625 MG tablet Commonly known as:  FIBERCON Take 1,250 mg by mouth 2 (two) times daily. Take with 8 oz of water   potassium chloride 10 MEQ tablet Commonly known as:  K-DUR,KLOR-CON Take 20 mEq by mouth daily. 2 caps = 20 meq , May open and sprinkle on food   sennosides-docusate sodium 8.6-50 MG tablet Commonly known as:  SENOKOT-S Take 1 tablet by mouth 2 (two) times daily.   sertraline 100 MG tablet Commonly known as:  ZOLOFT Take 100 mg by mouth daily.   tamsulosin 0.4 MG Caps capsule Commonly known as:  FLOMAX Take 0.4 mg by mouth at bedtime.   traMADol 50 MG tablet Commonly known as:  ULTRAM Take 50 mg by mouth 2 (two) times daily as needed.   traMADol 50 MG tablet Commonly known as:  ULTRAM Take 1 tablet (50 mg total) by mouth 2 (two) times daily. Hold for sedation       Review of Systems  Unable to perform ROS: Dementia  Constitutional: Negative for activity change, appetite change, chills, diaphoresis and fever.  HENT: Negative for congestion, mouth sores, nosebleeds, postnasal drip, sneezing, sore throat, trouble swallowing and voice change.   Respiratory: Negative for apnea, cough, choking, chest tightness, shortness of breath and wheezing.   Cardiovascular: Negative for chest pain, palpitations and leg swelling.  Gastrointestinal: Negative for abdominal distention, abdominal pain, constipation, diarrhea and nausea.  Genitourinary: Negative for difficulty urinating, dysuria, frequency and urgency.    Musculoskeletal: Positive for arthralgias (typical arthritis). Negative for back pain, gait problem and myalgias.  Skin: Negative for color change, pallor, rash and wound.  Neurological: Positive for weakness. Negative for dizziness, tremors, syncope, speech difficulty, numbness and headaches.  Psychiatric/Behavioral: Positive for agitation and behavioral problems (at times).  All other systems reviewed and are negative.   Immunization History  Administered Date(s) Administered  . Influenza Whole 01/02/2010  . Influenza-Unspecified 12/04/2013, 11/24/2014, 12/05/2015, 12/04/2016  . PPD Test 12/21/2015, 12/08/2016  . Pneumococcal-Unspecified 10/14/2012  . Td 01/10/2010   Pertinent  Health Maintenance Due  Topic Date Due  . COLONOSCOPY  08/23/2000  . PNA vac Low Risk Adult (1 of 2 - PCV13) 08/24/2015  . INFLUENZA VACCINE  Completed   Fall Risk  07/27/2012  Falls in the past year? No  Functional Status Survey:    Vitals:   04/10/17 1027  Resp: 18  Temp: 97.7 F (36.5 C)  SpO2: 95%  Weight: 147 lb 1.6 oz (66.7 kg)  Height: 5\' 5"  (1.651 m)   Body mass index is 24.48 kg/m. Physical Exam  Constitutional: Vital signs are normal. He appears well-developed and well-nourished. He is active and cooperative. He does not appear ill. No distress.  HENT:  Head: Normocephalic and atraumatic.  Mouth/Throat: Uvula is midline, oropharynx is clear and moist and mucous membranes are normal. Mucous membranes are not pale, not dry and not cyanotic.  Eyes: Conjunctivae, EOM and lids are normal. Pupils are equal, round, and reactive to light.  Neck: Trachea normal, normal range of motion and full passive range of motion without pain. Neck supple. No JVD present. No tracheal deviation, no edema and no erythema present. No thyromegaly present.  Cardiovascular: Normal rate, regular rhythm, normal heart sounds, intact distal pulses and normal pulses. Exam reveals no gallop, no distant heart sounds  and no friction rub.  No murmur heard. Pulses:      Dorsalis pedis pulses are 2+ on the right side, and 2+ on the left side.  No edema  Pulmonary/Chest: Effort normal and breath sounds normal. No accessory muscle usage. No respiratory distress. He has no decreased breath sounds. He has no wheezes. He has no rhonchi. He has no rales. He exhibits no tenderness.  Abdominal: Soft. Normal appearance and bowel sounds are normal. He exhibits no distension and no ascites. There is no tenderness.  Musculoskeletal: Normal range of motion. He exhibits no edema or tenderness.  Expected osteoarthritis, stiffness; Bilateral Calves soft, supple. Negative Homan's Sign. B- pedal pulses equal; bed to chair transfers with the mechanical lift; generalized weakness;   Neurological: He is alert. He has normal strength. He displays atrophy. A cranial nerve deficit is present. He exhibits abnormal muscle tone. Coordination and gait abnormal.  Skin: Skin is warm, dry and intact. He is not diaphoretic. No cyanosis. No pallor. Nails show no clubbing.  Psychiatric: He has a normal mood and affect. Thought content normal. His speech is rapid and/or pressured. He is agitated (at times). Cognition and memory are impaired. He expresses impulsivity and inappropriate judgment. He exhibits abnormal recent memory.  Nursing note and vitals reviewed.   Labs reviewed: Recent Labs    07/08/16 0809 01/22/17 0812  NA 138 138  K 4.3 4.4  CL 103 103  CO2 29 27  GLUCOSE 86 95  BUN 25* 31*  CREATININE 0.94 0.98  CALCIUM 9.6 9.6  MG 2.0  --    Recent Labs    07/08/16 0809 01/22/17 0812  AST 17 14*  ALT 18 15*  ALKPHOS 44 51  BILITOT 0.7 0.6  PROT 8.0 7.5  ALBUMIN 4.8 4.3   Recent Labs    07/08/16 0809 01/22/17 0812  WBC 5.3 6.3  NEUTROABS 3.1 3.6  HGB 14.0 13.6  HCT 43.0 41.8  MCV 88.4 89.4  PLT 141* 131*   Lab Results  Component Value Date   TSH 3.069 01/22/2017   No results found for: HGBA1C Lab Results   Component Value Date   CHOL 86 07/12/2012   HDL 51 01/08/2010   LDLCALC 126 (H) 01/08/2010   TRIG 88 07/12/2012   CHOLHDL 3.8 Ratio 01/08/2010    Significant Diagnostic Results in last 30 days:  No results found.  Assessment/Plan Dacari was seen today for medical management of chronic issues.  Diagnoses and all  orders for this visit:  Cerebral artery occlusion with cerebral infarction [facility])  Pancreatic necrosis  Gastroesophageal reflux disease without esophagitis   Above listed conditions stable  Continue current medication regimen  Monitor for safety as pt is a fall risk  Have pt in the common areas as much as possible for monitoring  Monitor for signs of hyper/hypoglycemia   Monitor for weight loss  Family/ staff Communication:   Total Time:  Documentation:  Face to Face:  Family/Phone:   Labs/tests ordered:  Not due  Medication list reviewed and assessed for continued appropriateness. Monthly medication orders reviewed and signed.  Brynda Rim, NP-C [facility] [phone] [address], [place] [address] Cell Phone (Mon-Fri 8am-5pm):  [phone] On Call:  [phone] & follow prompts after 5pm & weekends Office Phone:  [phone] Office Fax:  [phone]

## 2017-05-17 NOTE — Assessment & Plan Note (Signed)
Stable. No medicinal treatment.  

## 2017-05-17 NOTE — Assessment & Plan Note (Signed)
Stable. No alcohol intake since admission. Unable to perform ADLs or accurately verbalize needs.

## 2017-05-17 NOTE — Assessment & Plan Note (Signed)
Stable. No indication of subsequent CVA. Not on anticoagulant d/t fall risk

## 2017-05-17 NOTE — Assessment & Plan Note (Signed)
Stable. Symptoms managed on Protonix 20 mg po Q Day.

## 2017-06-08 ENCOUNTER — Encounter
Admission: RE | Admit: 2017-06-08 | Discharge: 2017-06-08 | Disposition: A | Discharge status: Home / Self Care | Payer: Medicare Other | Source: Ambulatory Visit | Attending: Internal Medicine | Admitting: Internal Medicine

## 2017-06-15 ENCOUNTER — Encounter: Payer: Self-pay | Admitting: Gerontology

## 2017-06-15 ENCOUNTER — Non-Acute Institutional Stay (SKILLED_NURSING_FACILITY): Payer: Medicare Other | Admitting: Gerontology

## 2017-06-15 DIAGNOSIS — S42202G Unspecified fracture of upper end of left humerus, subsequent encounter for fracture with delayed healing: Secondary | ICD-10-CM

## 2017-06-15 DIAGNOSIS — M810 Age-related osteoporosis without current pathological fracture: Secondary | ICD-10-CM | POA: Diagnosis not present

## 2017-06-15 DIAGNOSIS — M47817 Spondylosis without myelopathy or radiculopathy, lumbosacral region: Secondary | ICD-10-CM

## 2017-06-15 NOTE — Progress Notes (Signed)
Location:    [facility] Home Room Number: 323A Place of Service:  SNF (31) Provider:  Lorenso Quarry, NP-C  Lauro Regulus, MD  Patient Care Team: Lauro Regulus, MD as PCP - General (Unknown Physician Specialty) Durene Romans, MD as Consulting Physician (Orthopedic Surgery)  Extended Emergency Contact Information Primary Emergency Contact: Tula Nakayama Address: [address]          [place], [place] [address] D[place] of [place] Home Phone: [phone] Mobile Phone: [phone] Relation: Sister  Code Status:  FULL Goals of care: Advanced Directive information Advanced Directives 06/15/2017  Does Patient Have a Medical Advance Directive? No  Does patient want to make changes to medical advance directive? -  Would patient like information on creating a medical advance directive? No - Patient declined  Pre-existing out of facility DNR order (yellow form or pink MOST form) -     Chief Complaint  Patient presents with  . Medical Management of Chronic Issues    Routine Visit    HPI:  Pt is a 67 y.o. male seen today for medical management of chronic diseases.    SPONDYLOSIS, LUMBAR Stable. No recent complaints of pain or worsening behaviors possibly r/t pain. Symptoms managed with Tramadol 50 mg po BID scheduled and BID PRN, Neurontin 100 mg TID, Baclofen 10 mg TID and APAP 1000 mg po TID  Fracture of left humerus Stable. No treatment. Chronic/ impaired healing. Pain managed with APAP, Tramadol, Neurontin and baclofen. Pt has full use of the arm  Age-related osteoporosis without current pathological fracture Stable. No recent fractures, except impaired healing of humerus fracture- chronic. On Cholecalciferol 2,000 units po Q Day. Last Vitamin D level WNL.  Please note pt with limited verbal/cognitive ability. Unable to obtain complete ROS. Some ROS info obtained from staff and documentation.   Past Medical History:  Diagnosis Date  . Alcoholic hepatitis  2001  . Anxiety   . Chronic pain   . 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 Depression, major, in remission (HCC) 05/10/2015  . Epistaxis 2001   required cauterization  . Fracture of left humerus   . GERD (gastroesophageal reflux disease)   . History of alcoholism (HCC)   . History of pancreatitis 05/10/2015  . Hypertonicity of bladder   . Lumbar spondylitis (HCC)   . Osteoporosis 05/10/2015  . Stroke (HCC)   . 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   . Thrombus of left atrial appendage 2003  . Urinary incontinence    Past Surgical History:  Procedure Laterality Date  . FEMUR IM NAIL Left 06/25/2012   Procedure: INTRAMEDULLARY (IM) NAIL FEMORAL;  Surgeon: Shelda Pal, MD;  Location: [facility];  Service: Orthopedics;  Laterality: Left;  . HAND SURGERY Bilateral 2003   crush injuries with multitrauma  . KNEE SURGERY  1996    Allergies  Allergen Reactions  . Cefazolin Rash  . Penicillins     REACTION: unknown allergy - hx since childhood    Allergies as of 06/15/2017      Reactions   Cefazolin Rash   Penicillins    REACTION: unknown allergy - hx since childhood      Medication List        Accurate as of 06/15/17 10:31 PM. Always use your most recent med list.          acetaminophen 500 MG tablet Commonly known as:  TYLENOL Take 2 tablets (1,000 mg total) by mouth 3 (three)  times daily.   baclofen 10 MG tablet Commonly known as:  LIORESAL Take 10 mg by mouth 3 (three) times daily.   cholecalciferol 1000 units tablet Commonly known as:  VITAMIN D Take 2,000 Units by mouth every morning. 2 tabs   cloNIDine 0.1 MG tablet Commonly known as:  CATAPRES Take 0.05 mg by mouth daily. 1/2 tablet   clopidogrel 75 MG tablet Commonly known as:  PLAVIX Take 75 mg by mouth every morning.   FLUoxetine 20 MG tablet Commonly known as:  PROZAC Take 20 mg by mouth daily.   folic acid 400 MCG tablet Commonly known as:   FOLVITE Take 400 mcg by mouth daily.   gabapentin 100 MG capsule Commonly known as:  NEURONTIN Take 100 mg by mouth 3 (three) times daily.   lamoTRIgine 25 MG tablet Commonly known as:  LAMICTAL Take 25 mg by mouth 3 (three) times daily.   LORazepam 0.5 MG tablet Commonly known as:  ATIVAN Take 0.25 mg by mouth daily. Hold for sedation. Attempting GDR   magnesium hydroxide 400 MG/5ML suspension Commonly known as:  MILK OF MAGNESIA Take 30 mLs by mouth every 4 (four) hours as needed for mild constipation. Constipation / no BM for 2 days   Magnesium Oxide 250 MG Tabs Take 250 mg by mouth daily.   pantoprazole 20 MG tablet Commonly known as:  PROTONIX Take 20 mg by mouth every morning.   polycarbophil 625 MG tablet Commonly known as:  FIBERCON Take 1,250 mg by mouth 2 (two) times daily. Take with 8 oz of water   potassium chloride 10 MEQ tablet Commonly known as:  K-DUR,KLOR-CON Take 20 mEq by mouth daily. 2 caps = 20 meq , May open and sprinkle on food   propranolol 10 MG tablet Commonly known as:  INDERAL Take 10 mg by mouth 3 (three) times daily.   sennosides-docusate sodium 8.6-50 MG tablet Commonly known as:  SENOKOT-S Take 1 tablet by mouth 2 (two) times daily.   tamsulosin 0.4 MG Caps capsule Commonly known as:  FLOMAX Take 0.4 mg by mouth at bedtime.   traMADol 50 MG tablet Commonly known as:  ULTRAM Take 50 mg by mouth 2 (two) times daily as needed.   traMADol 50 MG tablet Commonly known as:  ULTRAM Take 1 tablet (50 mg total) by mouth 2 (two) times daily. Hold for sedation       Review of Systems  Unable to perform ROS: Dementia  Constitutional: Negative for activity change, appetite change, chills, diaphoresis and fever.  HENT: Negative for congestion, mouth sores, nosebleeds, postnasal drip, sneezing, sore throat, trouble swallowing and voice change.   Respiratory: Negative for apnea, cough, choking, chest tightness, shortness of breath and  wheezing.   Cardiovascular: Negative for chest pain, palpitations and leg swelling.  Gastrointestinal: Negative for abdominal distention, abdominal pain, constipation, diarrhea and nausea.  Genitourinary: Negative for difficulty urinating, dysuria, frequency and urgency.  Musculoskeletal: Positive for arthralgias (typical arthritis), back pain and gait problem. Negative for myalgias.  Skin: Negative for color change, pallor, rash and wound.  Neurological: Negative for dizziness, tremors, syncope, speech difficulty, weakness, numbness and headaches.  Psychiatric/Behavioral: Positive for agitation (at times), behavioral problems and confusion.  All other systems reviewed and are negative.   Immunization History  Administered Date(s) Administered  . Influenza Whole 01/02/2010  . Influenza-Unspecified 12/04/2013, 11/24/2014, 12/05/2015, 12/04/2016  . PPD Test 12/21/2015, 12/08/2016  . Pneumococcal-Unspecified 10/14/2012  . Td 01/10/2010   Pertinent  Health Maintenance Due  Topic Date Due  . COLONOSCOPY  08/23/2000  . PNA vac Low Risk Adult (1 of 2 - PCV13) 08/24/2015  . INFLUENZA VACCINE  10/08/2017   Fall Risk  07/27/2012  Falls in the past year? No   Functional Status Survey:    Vitals:   06/15/17 1319  BP: (!) 148/90  Pulse: 86  Resp: (!) 22  Temp: 98.1 F (36.7 C)  TempSrc: Oral  SpO2: 97%  Weight: 145 lb 4.8 oz (65.9 kg)  Height:  (1.651 m)   Body mass index is 24.18 kg/m. Physical Exam  Constitutional: Vital signs are normal. He appears well-developed and well-nourished. He is active and cooperative. He does not appear ill. No distress.  HENT:  Head: Normocephalic and atraumatic.  Mouth/Throat: Uvula is midline, oropharynx is clear and moist and mucous membranes are normal. Mucous membranes are not pale, not dry and not cyanotic.  Eyes: Pupils are equal, round, and reactive to light. Conjunctivae, EOM and lids are normal.  Neck: Trachea normal, normal range of  motion and full passive range of motion without pain. Neck supple. No JVD present. No tracheal deviation, no edema and no erythema present. No thyromegaly present.  Cardiovascular: Normal rate, regular rhythm, normal heart sounds, intact distal pulses and normal pulses. Exam reveals no gallop, no distant heart sounds and no friction rub.  No murmur heard. Pulses:      Dorsalis pedis pulses are 2+ on the right side, and 2+ on the left side.  No edema  Pulmonary/Chest: Effort normal and breath sounds normal. No accessory muscle usage. No respiratory distress. He has no decreased breath sounds. He has no wheezes. He has no rhonchi. He has no rales. He exhibits no tenderness.  Abdominal: Soft. Normal appearance and bowel sounds are normal. He exhibits no distension and no ascites. There is no tenderness.  Musculoskeletal: Normal range of motion. He exhibits no edema or tenderness.  Expected osteoarthritis, stiffness; Bilateral Calves soft, supple. Negative Homan's Sign. B- pedal pulses equal; generalized weakness; transfers with mechanical lift  Neurological: He is alert. He has normal strength. He displays atrophy. A cranial nerve deficit and sensory deficit is present. He exhibits abnormal muscle tone. Coordination and gait abnormal.  Skin: Skin is warm, dry and intact. He is not diaphoretic. No cyanosis. No pallor. Nails show no clubbing.  Psychiatric: Thought content normal. His affect is blunt. He is agitated (at times). Cognition and memory are impaired. He expresses impulsivity. He is noncommunicative. He exhibits abnormal recent memory and abnormal remote memory.  Nursing note and vitals reviewed.   Labs reviewed: Recent Labs    07/08/16 0809 01/22/17 0812  NA 138 138  K 4.3 4.4  CL 103 103  CO2 29 27  GLUCOSE 86 95  BUN 25* 31*  CREATININE 0.94 0.98  CALCIUM 9.6 9.6  MG 2.0  --    Recent Labs    07/08/16 0809 01/22/17 0812  AST 17 14*  ALT 18 15*  ALKPHOS 44 51  BILITOT 0.7  0.6  PROT 8.0 7.5  ALBUMIN 4.8 4.3   Recent Labs    07/08/16 0809 01/22/17 0812  WBC 5.3 6.3  NEUTROABS 3.1 3.6  HGB 14.0 13.6  HCT 43.0 41.8  MCV 88.4 89.4  PLT 141* 131*   Lab Results  Component Value Date   TSH 3.069 01/22/2017   No results found for: HGBA1C Lab Results  Component Value Date   CHOL 86 07/12/2012   HDL 51 01/08/2010  LDLCALC 126 (H) 01/08/2010   TRIG 88 07/12/2012   CHOLHDL 3.8 Ratio 01/08/2010    Significant Diagnostic Results in last 30 days:  No results found.   Assessment/Plan Torben was seen today for medical management of chronic issues.  Diagnoses and all orders for this visit:  Age-related osteoporosis without current pathological fracture  SPONDYLOSIS, LUMBAR  Closed fracture of proximal end of left humerus with delayed healing, unspecified fracture morphology, subsequent encounter   Above listed conditions stable  Continue current medication regimen  Monitor for safety  Fall precautions  Use caution with use of Left arm  Assist with ADLs as appropriate  Monitor for s/s of pain  Family/ staff Communication:   Total Time:  Documentation:  Face to Face:  Family/Phone:   Labs/tests ordered:  Not due  Medication list reviewed and assessed for continued appropriateness. Monthly medication orders reviewed and signed.  Brynda Rim, NP-C [facility] [id number], [place] [address] Cell Phone (Mon-Fri 8am-5pm):  [phone] On Call:  [phone] & follow prompts after 5pm & weekends Office Phone:  [phone] Office Fax:  [phone]

## 2017-06-15 NOTE — Assessment & Plan Note (Signed)
Stable. No treatment. Chronic/ impaired healing. Pain managed with APAP, Tramadol, Neurontin and baclofen. Pt has full use of the arm

## 2017-06-15 NOTE — Assessment & Plan Note (Signed)
Stable. No recent complaints of pain or worsening behaviors possibly r/t pain. Symptoms managed with Tramadol 50 mg po BID scheduled and BID PRN, Neurontin 100 mg TID, Baclofen 10 mg TID and APAP 1000 mg po TID

## 2017-06-15 NOTE — Assessment & Plan Note (Signed)
Stable. No recent fractures, except impaired healing of humerus fracture- chronic. On Cholecalciferol 2,000 units po Q Day. Last Vitamin D level WNL.

## 2017-07-08 ENCOUNTER — Other Ambulatory Visit: Payer: Self-pay

## 2017-07-08 ENCOUNTER — Encounter
Admission: RE | Admit: 2017-07-08 | Discharge: 2017-07-08 | Disposition: A | Discharge status: Home / Self Care | Payer: Medicare Other | Source: Ambulatory Visit | Attending: Internal Medicine | Admitting: Internal Medicine

## 2017-07-08 MED ORDER — TRAMADOL HCL 50 MG PO TABS: 50.0000 mg | ORAL_TABLET | Freq: Two times a day (BID) | ORAL | 3 refills | Status: DC

## 2017-07-08 NOTE — Telephone Encounter (Signed)
Rx sent to [facility] phone : [phone] , fax : [phone]  

## 2017-07-14 ENCOUNTER — Other Ambulatory Visit
Admission: RE | Admit: 2017-07-14 | Discharge: 2017-07-14 | Disposition: A | Discharge status: Home / Self Care | Payer: Medicare Other | Source: Skilled Nursing Facility | Attending: Gerontology | Admitting: Gerontology

## 2017-07-14 DIAGNOSIS — R109 Unspecified abdominal pain: Secondary | ICD-10-CM | POA: Insufficient documentation

## 2017-07-14 LAB — URINALYSIS, COMPLETE (UACMP) WITH MICROSCOPIC
BILIRUBIN URINE: NEGATIVE
Glucose, UA: NEGATIVE mg/dL
KETONES UR: NEGATIVE mg/dL
Nitrite: NEGATIVE
PH: 6 (ref 5.0–8.0)
Protein, ur: 30 mg/dL — AB
SPECIFIC GRAVITY, URINE: 1.024 (ref 1.005–1.030)
SQUAMOUS EPITHELIAL / LPF: NONE SEEN (ref 0–5)

## 2017-07-15 ENCOUNTER — Other Ambulatory Visit: Payer: Self-pay

## 2017-07-15 MED ORDER — TRAMADOL HCL 50 MG PO TABS: 50.0000 mg | ORAL_TABLET | Freq: Three times a day (TID) | ORAL | 3 refills | Status: DC

## 2017-07-15 NOTE — Telephone Encounter (Signed)
Rx sent to [facility] phone : [phone] , fax : [phone]  

## 2017-07-18 LAB — URINE CULTURE

## 2017-07-22 ENCOUNTER — Non-Acute Institutional Stay (SKILLED_NURSING_FACILITY): Payer: Medicare Other | Admitting: Gerontology

## 2017-07-22 ENCOUNTER — Encounter: Payer: Self-pay | Admitting: Gerontology

## 2017-07-22 DIAGNOSIS — G8929 Other chronic pain: Secondary | ICD-10-CM | POA: Diagnosis not present

## 2017-07-22 DIAGNOSIS — F39 Unspecified mood [affective] disorder: Secondary | ICD-10-CM

## 2017-07-22 DIAGNOSIS — N3000 Acute cystitis without hematuria: Secondary | ICD-10-CM | POA: Diagnosis not present

## 2017-07-23 NOTE — Assessment & Plan Note (Signed)
Worsening behaviors as of late. Periodic GDR's done on lorazepam and lamictal. Pt appears to be increasing with aggressive/sexual behaviors. Not easily re-directed at times. Typically "behaves" if he is sitting in the common area or by the windows. On Prozac 20 mg po Q Day, Lamictal 25 mg po TID for mood.

## 2017-07-23 NOTE — Assessment & Plan Note (Signed)
Seems to likely be worsening as pt's behaviors seem to be worsening as of late. More restless. Says his "balls hurt." (He is most likely sitting on them when in the chair d/t pendulous anatomy.) Pt is mostly non-verbal, except says "damn, damn" repeatedly. Unable to fully verbalize needs to characteristics of pain or discomfort/distress. On Tramadol 50 mg po BID scheduled and BID prn and APAP 1,000 mg po TID along with Neurontin 100 mg TID for pain.

## 2017-07-23 NOTE — Progress Notes (Signed)
Location:    Nursing Home Room Number: 323A Place of Service:  SNF (31) Provider:  Toni Arthurs, NP-C  Kirk Ruths, MD  Patient Care Team: Kirk Ruths, MD as PCP - General (Unknown Physician Specialty) Paralee Cancel, MD as Consulting Physician (Orthopedic Surgery)  Extended Emergency Contact Information Primary Emergency Contact: Suzan Garibaldi Address: [address]          [place], [place] [address] [name] of [place] Phone: [phone] Mobile Phone: [phone] Relation: Sister  Code Status:  FULL Goals of care: Advanced Directive information Advanced Directives 07/22/2017  Does Patient Have a Medical Advance Directive? No  Does patient want to make changes to medical advance directive? -  Would patient like information on creating a medical advance directive? No - Patient declined  Pre-existing out of facility DNR order (yellow form or pink MOST form) -     Chief Complaint  Patient presents with  . Medical Management of Chronic Issues    Routine Visit    HPI:  Pt is a 67 y.o. male seen today for medical management of chronic diseases.    Mood disorder ([place]) Worsening behaviors as of late. Periodic GDR's done on lorazepam and lamictal. Pt appears to be increasing with aggressive/sexual behaviors. Not easily re-directed at times. Typically "behaves" if he is sitting in the common area or by the windows. On Prozac 20 mg po Q Day, Lamictal 25 mg po TID for mood.   Chronic pain Seems to likely be worsening as pt's behaviors seem to be worsening as of late. More restless. Says his "balls hurt." (He is most likely sitting on them when in the chair d/t pendulous anatomy.) Pt is mostly non-verbal, except says "damn, damn" repeatedly. Unable to fully verbalize needs to characteristics of pain or discomfort/distress. On Tramadol 50 mg po BID scheduled and BID prn and APAP 1,000 mg po TID along with Neurontin 100 mg TID for pain.   Acute cystitis  without hematuria Re-check. Behaviors improving. Appears to be resolving. On antibiotics appropriate for bacteria  Please note pt with limited verbal/cognitive ability. Unable to obtain complete ROS. Some ROS info obtained from staff and documentation.    Past Medical History:  Diagnosis Date  . Alcoholic hepatitis 1884  . Anxiety   . Chronic pain   . 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 Depression, major, in remission ([place]) 05/10/2015  . Epistaxis 2001   required cauterization  . Fracture of left humerus   . GERD (gastroesophageal reflux disease)   . History of alcoholism ([place])   . History of pancreatitis 05/10/2015  . Hypertonicity of bladder   . Lumbar spondylitis ([place])   . Osteoporosis 05/10/2015  . Stroke ([place])   . 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   . Thrombus of left atrial appendage 2003  . Urinary incontinence    Past Surgical History:  Procedure Laterality Date  . FEMUR IM NAIL Left 06/25/2012   Procedure: INTRAMEDULLARY (IM) NAIL FEMORAL;  Surgeon: Mauri Pole, MD;  Location: [facility];  Service: Orthopedics;  Laterality: Left;  . HAND SURGERY Bilateral 2003   crush injuries with multitrauma  . KNEE SURGERY  1996    Allergies  Allergen Reactions  . Cefazolin Rash  . Penicillins     REACTION: unknown allergy - hx since childhood    Allergies as of 07/22/2017      Reactions   Cefazolin Rash   Penicillins  REACTION: unknown allergy - hx since childhood      Medication List        Accurate as of 07/22/17 11:59 PM. Always use your most recent med list.          acetaminophen 500 MG tablet Commonly known as:  TYLENOL Take 2 tablets (1,000 mg total) by mouth 3 (three) times daily.   baclofen 10 MG tablet Commonly known as:  LIORESAL Take 10 mg by mouth 3 (three) times daily.   cholecalciferol 1000 units tablet Commonly known as:  VITAMIN D Take 2,000 Units by mouth every morning. 2  tabs   cloNIDine 0.1 MG tablet Commonly known as:  CATAPRES Take 0.05 mg by mouth daily. 1/2 tablet   clopidogrel 75 MG tablet Commonly known as:  PLAVIX Take 75 mg by mouth every morning.   DERMACLOUD EX Apply liberal amount topically to area of skin irritation prn. OK to leave at bedside.   FLUoxetine 20 MG tablet Commonly known as:  PROZAC Take 20 mg by mouth daily.   folic acid 161 MCG tablet Commonly known as:  FOLVITE Take 400 mcg by mouth daily.   gabapentin 100 MG capsule Commonly known as:  NEURONTIN Take 100 mg by mouth 3 (three) times daily.   lamoTRIgine 25 MG tablet Commonly known as:  LAMICTAL Take 25 mg by mouth 3 (three) times daily.   magnesium hydroxide 400 MG/5ML suspension Commonly known as:  MILK OF MAGNESIA Take 30 mLs by mouth every 4 (four) hours as needed for mild constipation. Constipation / no BM for 2 days   Magnesium Oxide 250 MG Tabs Take 250 mg by mouth daily.   pantoprazole 20 MG tablet Commonly known as:  PROTONIX Take 20 mg by mouth every morning.   polycarbophil 625 MG tablet Commonly known as:  FIBERCON Take 1,250 mg by mouth 2 (two) times daily. Take with 8 oz of water   potassium chloride 10 MEQ tablet Commonly known as:  K-DUR,KLOR-CON Take 20 mEq by mouth daily. 2 caps = 20 meq , May open and sprinkle on food   propranolol 10 MG tablet Commonly known as:  INDERAL Take 10 mg by mouth 3 (three) times daily.   sennosides-docusate sodium 8.6-50 MG tablet Commonly known as:  SENOKOT-S Take 1 tablet by mouth 2 (two) times daily.   sulfamethoxazole-trimethoprim 800-160 MG tablet Commonly known as:  BACTRIM DS,SEPTRA DS Take 1 tablet by mouth 2 (two) times daily.   tamsulosin 0.4 MG Caps capsule Commonly known as:  FLOMAX Take 0.4 mg by mouth at bedtime.   traMADol 50 MG tablet Commonly known as:  ULTRAM Take 50 mg by mouth 2 (two) times daily as needed.   traMADol 50 MG tablet Commonly known as:  ULTRAM Take 1  tablet (50 mg total) by mouth 3 (three) times daily. Hold for sedation       Review of Systems  Unable to perform ROS: Patient nonverbal  Constitutional: Negative for activity change, appetite change, chills, diaphoresis and fever.  HENT: Negative for congestion, mouth sores, nosebleeds, postnasal drip, sneezing, sore throat, trouble swallowing and voice change.   Respiratory: Negative for apnea, cough, choking, chest tightness, shortness of breath and wheezing.   Cardiovascular: Negative for chest pain, palpitations and leg swelling.  Gastrointestinal: Negative for abdominal distention, abdominal pain, constipation, diarrhea and nausea.  Genitourinary: Negative for difficulty urinating, dysuria, frequency and urgency.  Musculoskeletal: Positive for arthralgias (typical arthritis) and gait problem. Negative for back pain and myalgias.  Skin: Negative for color change, pallor, rash and wound.  Neurological: Positive for weakness. Negative for dizziness, tremors, syncope, speech difficulty, numbness and headaches.  Psychiatric/Behavioral: Positive for agitation, behavioral problems and confusion. The patient is nervous/anxious.   All other systems reviewed and are negative.   Immunization History  Administered Date(s) Administered  . Influenza Whole 01/02/2010  . Influenza-Unspecified 12/04/2013, 11/24/2014, 12/05/2015, 12/04/2016  . PPD Test 12/21/2015, 12/08/2016  . Pneumococcal-Unspecified 10/14/2012  . Td 01/10/2010   Pertinent  Health Maintenance Due  Topic Date Due  . COLONOSCOPY  08/23/2000  . PNA vac Low Risk Adult (1 of 2 - PCV13) 08/24/2015  . INFLUENZA VACCINE  10/08/2017   Fall Risk  07/27/2012  Falls in the past year? No   Functional Status Survey:    Vitals:   07/22/17 0929  BP: 110/64  Pulse: 79  Resp: 20  Temp: (!) 97.4 F (36.3 C)  TempSrc: Oral  SpO2: 97%  Weight: 147 lb 3.2 oz (66.8 kg)  Height: 5' 5"  (1.651 m)   Body mass index is 24.5  kg/m. Physical Exam  Constitutional: Vital signs are normal. He appears well-developed and well-nourished. He is active and cooperative. He does not appear ill. No distress.  HENT:  Head: Normocephalic and atraumatic.  Mouth/Throat: Uvula is midline, oropharynx is clear and moist and mucous membranes are normal. Mucous membranes are not pale, not dry and not cyanotic.  Eyes: Pupils are equal, round, and reactive to light. Conjunctivae, EOM and lids are normal.  Neck: Trachea normal, normal range of motion and full passive range of motion without pain. Neck supple. No JVD present. No tracheal deviation, no edema and no erythema present. No thyromegaly present.  Cardiovascular: Normal rate, regular rhythm, normal heart sounds, intact distal pulses and normal pulses. Exam reveals no gallop, no distant heart sounds and no friction rub.  No murmur heard. Pulses:      Dorsalis pedis pulses are 2+ on the right side, and 2+ on the left side.  No edema  Pulmonary/Chest: Effort normal and breath sounds normal. No accessory muscle usage. No respiratory distress. He has no decreased breath sounds. He has no wheezes. He has no rhonchi. He has no rales. He exhibits no tenderness.  Abdominal: Soft. Normal appearance and bowel sounds are normal. He exhibits no distension and no ascites. There is no tenderness. There is no rigidity, no guarding and no CVA tenderness.  Musculoskeletal: Normal range of motion. He exhibits no edema or tenderness.  Expected osteoarthritis, stiffness; Bilateral Calves soft, supple. Negative Homan's Sign. B- pedal pulses equal; generalized weakness, non-ambulatory; mobile with hoyer-lift  Neurological: He is alert. He has normal strength. He displays atrophy. He exhibits abnormal muscle tone. Coordination and gait abnormal.  Skin: Skin is warm, dry and intact. He is not diaphoretic. No cyanosis. No pallor. Nails show no clubbing.  Psychiatric: Thought content normal. His affect is  blunt. He is agitated and hyperactive. Cognition and memory are impaired. He expresses impulsivity and inappropriate judgment. He is noncommunicative. He exhibits abnormal recent memory.  Nursing note and vitals reviewed.   Labs reviewed: Recent Labs    01/22/17 0812  NA 138  K 4.4  CL 103  CO2 27  GLUCOSE 95  BUN 31*  CREATININE 0.98  CALCIUM 9.6   Recent Labs    01/22/17 0812  AST 14*  ALT 15*  ALKPHOS 51  BILITOT 0.6  PROT 7.5  ALBUMIN 4.3   Recent Labs    01/22/17 7371  WBC 6.3  NEUTROABS 3.6  HGB 13.6  HCT 41.8  MCV 89.4  PLT 131*   Lab Results  Component Value Date   TSH 3.069 01/22/2017   No results found for: HGBA1C Lab Results  Component Value Date   CHOL 86 07/12/2012   HDL 51 01/08/2010   LDLCALC 126 (H) 01/08/2010   TRIG 88 07/12/2012   CHOLHDL 3.8 Ratio 01/08/2010    Significant Diagnostic Results in last 30 days:  No results found.  Assessment/Plan Brodrick was seen today for medical management of chronic issues.  Diagnoses and all orders for this visit:  Other chronic pain  Mood disorder ([place])  Acute cystitis without hematuria   Above listed conditions variable, progressive  Continue current medication regimen, except  Increase Tramadol to 50 mg po TID scheduled  Increase Prozac to 30 mg po Q Day  Continue Bactrim DS po Q 12 hours x 14 days for UTI  Elevate scrotum, if possible, with rolled washcloth, etc to prevent sitting on them  Re-direct behaviors as necessary  Have pt in the common area for monitoring as much as possible  Labs next week  Family/ staff Communication:   Total Time:  Documentation:  Face to Face:  Family/Phone:   Labs/tests ordered:  CBC, Met C, Mag+, TSH, B12, D  Medication list reviewed and assessed for continued appropriateness. Monthly medication orders reviewed and signed.  Vikki Ports, NP-C Geriatrics [facility] [phone] [address], [place] [address] Cell Phone (Mon-Fri 8am-5pm):  [phone] On Call:  [phone] & follow prompts after 5pm & weekends Office Phone:  [phone] Office Fax:  [phone]

## 2017-07-28 ENCOUNTER — Other Ambulatory Visit
Admission: RE | Admit: 2017-07-28 | Discharge: 2017-07-28 | Disposition: A | Discharge status: Home / Self Care | Payer: Medicare Other | Source: Ambulatory Visit | Attending: Gerontology | Admitting: Gerontology

## 2017-07-28 DIAGNOSIS — F1097 Alcohol use, unspecified with alcohol-induced persisting dementia: Secondary | ICD-10-CM | POA: Insufficient documentation

## 2017-07-28 LAB — COMPREHENSIVE METABOLIC PANEL
ALBUMIN: 4.1 g/dL (ref 3.5–5.0)
ALK PHOS: 47 U/L (ref 38–126)
ALT: 18 U/L (ref 17–63)
AST: 22 U/L (ref 15–41)
Anion gap: 5 (ref 5–15)
BUN: 27 mg/dL — ABNORMAL HIGH (ref 6–20)
CALCIUM: 9.1 mg/dL (ref 8.9–10.3)
CO2: 25 mmol/L (ref 22–32)
CREATININE: 0.97 mg/dL (ref 0.61–1.24)
Chloride: 105 mmol/L (ref 101–111)
GFR calc non Af Amer: >60 mL/min (ref >60)
GLUCOSE: 92 mg/dL (ref 65–99)
Potassium: 4.1 mmol/L (ref 3.5–5.1)
SODIUM: 135 mmol/L (ref 135–145)
Total Bilirubin: 0.7 mg/dL (ref 0.3–1.2)
Total Protein: 7.4 g/dL (ref 6.5–8.1)

## 2017-07-28 LAB — CBC WITH DIFFERENTIAL/PLATELET
Basophils Absolute: 0 10*3/uL (ref 0–0.1)
Basophils Relative: 0 %
EOS ABS: 0.1 10*3/uL (ref 0–0.7)
Eosinophils Relative: 2 %
HEMATOCRIT: 39.5 % — AB (ref 40.0–52.0)
HEMOGLOBIN: 13.3 g/dL (ref 13.0–18.0)
LYMPHS ABS: 1.7 10*3/uL (ref 1.0–3.6)
Lymphocytes Relative: 32 %
MCH: 29.9 pg (ref 26.0–34.0)
MCHC: 33.7 g/dL (ref 32.0–36.0)
MCV: 88.7 fL (ref 80.0–100.0)
Monocytes Absolute: 0.4 10*3/uL (ref 0.2–1.0)
Monocytes Relative: 7 %
NEUTROS ABS: 3.2 10*3/uL (ref 1.4–6.5)
NEUTROS PCT: 59 %
Platelets: 162 10*3/uL (ref 150–440)
RBC: 4.45 MIL/uL (ref 4.40–5.90)
RDW: 12.9 % (ref 11.5–14.5)
WBC: 5.4 10*3/uL (ref 3.8–10.6)

## 2017-07-28 LAB — VITAMIN B12: Vitamin B-12: 347 pg/mL (ref 180–914)

## 2017-07-28 LAB — MAGNESIUM: Magnesium: 1.6 mg/dL — ABNORMAL LOW (ref 1.7–2.4)

## 2017-07-28 LAB — TSH: TSH: 1.839 u[IU]/mL (ref 0.350–4.500)

## 2017-07-29 LAB — VITAMIN D 25 HYDROXY (VIT D DEFICIENCY, FRACTURES): VIT D 25 HYDROXY: 31.9 ng/mL (ref 30.0–100.0)

## 2017-08-08 ENCOUNTER — Encounter
Admission: RE | Admit: 2017-08-08 | Discharge: 2017-08-08 | Disposition: A | Discharge status: Home / Self Care | Payer: Medicare Other | Source: Ambulatory Visit | Attending: Internal Medicine | Admitting: Internal Medicine

## 2017-08-21 ENCOUNTER — Non-Acute Institutional Stay (SKILLED_NURSING_FACILITY): Payer: Medicare Other | Admitting: Gerontology

## 2017-08-21 ENCOUNTER — Encounter: Payer: Self-pay | Admitting: Gerontology

## 2017-08-21 DIAGNOSIS — R350 Frequency of micturition: Secondary | ICD-10-CM

## 2017-08-21 DIAGNOSIS — F419 Anxiety disorder, unspecified: Secondary | ICD-10-CM

## 2017-08-21 DIAGNOSIS — N3281 Overactive bladder: Secondary | ICD-10-CM | POA: Diagnosis not present

## 2017-08-21 DIAGNOSIS — N401 Enlarged prostate with lower urinary tract symptoms: Secondary | ICD-10-CM

## 2017-09-05 NOTE — Progress Notes (Signed)
Location:    Nursing Home Room Number: 323A Place of Service:  SNF (31) Provider:  Toni Arthurs, NP-C  Kirk Ruths, MD  Patient Care Team: Kirk Ruths, MD as PCP - General (Unknown Physician Specialty) Paralee Cancel, MD as Consulting Physician (Orthopedic Surgery)  Extended Emergency Contact Information Primary Emergency Contact: Suzan Garibaldi Address: [address]          [place],  [address] [name] of [place] Phone: [phone] Mobile Phone: [phone] Relation: Sister  Code Status:  FULL Goals of care: Advanced Directive information Advanced Directives 08/21/2017  Does Patient Have a Medical Advance Directive? No  Does patient want to make changes to medical advance directive? -  Would patient like information on creating a medical advance directive? No - Patient declined  Pre-existing out of facility DNR order (yellow form or pink MOST form) -     Chief Complaint  Patient presents with  . Medical Management of Chronic Issues    Routine Visit    HPI:  Pt is a 67 y.o. male seen today for medical management of chronic diseases.    BPH (benign prostatic hyperplasia) Stable. Incontinent. On Flomax 0.4 mg Q Day  Overactive bladder No change. Incontinent of urine and bowel.  Anxiety disorder Stable. Persistent. On Lamictal 25 mg TID and Prozac 20 mg po Q Day as well as Propranolol 10 mg TID.   Please note pt with limited verbal/cognitive ability. Unable to obtain complete ROS. Some ROS info obtained from staff and documentation.    Past Medical History:  Diagnosis Date  . Alcoholic hepatitis 9432  . Anxiety   . Chronic pain   . 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 Depression, major, in remission ([place]) 05/10/2015  . Epistaxis 2001   required cauterization  . Fracture of left humerus   . GERD (gastroesophageal reflux disease)   . History of alcoholism ([place])   . History of  pancreatitis 05/10/2015  . Hypertonicity of bladder   . Lumbar spondylitis ([place])   . Osteoporosis 05/10/2015  . Stroke ([place])   . 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   . Thrombus of left atrial appendage 2003  . Urinary incontinence    Past Surgical History:  Procedure Laterality Date  . FEMUR IM NAIL Left 06/25/2012   Procedure: INTRAMEDULLARY (IM) NAIL FEMORAL;  Surgeon: Mauri Pole, MD;  Location: [facility];  Service: Orthopedics;  Laterality: Left;  . HAND SURGERY Bilateral 2003   crush injuries with multitrauma  . KNEE SURGERY  1996    Allergies  Allergen Reactions  . Cefazolin Rash  . Penicillins     REACTION: unknown allergy - hx since childhood    Allergies as of 08/21/2017      Reactions   Cefazolin Rash   Penicillins    REACTION: unknown allergy - hx since childhood      Medication List        Accurate as of 08/21/17 11:59 PM. Always use your most recent med list.          acetaminophen 500 MG tablet Commonly known as:  TYLENOL Take 2 tablets (1,000 mg total) by mouth 3 (three) times daily.   baclofen 10 MG tablet Commonly known as:  LIORESAL Take 10 mg by mouth 3 (three) times daily.   cholecalciferol 1000 units tablet Commonly known as:  VITAMIN D Take 2,000 Units by mouth every morning. 2 tabs   cloNIDine 0.1 MG  tablet Commonly known as:  CATAPRES Take 0.05 mg by mouth daily. 1/2 tablet   clopidogrel 75 MG tablet Commonly known as:  PLAVIX Take 75 mg by mouth every morning.   cyanocobalamin 1000 MCG tablet Take 1,000 mcg by mouth daily.   DERMACLOUD EX Apply liberal amount topically to area of skin irritation prn. OK to leave at bedside.   FLUoxetine 20 MG tablet Commonly known as:  PROZAC Take 20 mg by mouth daily.   folic acid 973 MCG tablet Commonly known as:  FOLVITE Take 400 mcg by mouth daily.   gabapentin 100 MG capsule Commonly known as:  NEURONTIN Take 100 mg by mouth 3 (three) times daily.   lamoTRIgine 25 MG  tablet Commonly known as:  LAMICTAL Take 25 mg by mouth 3 (three) times daily. FOR MOOD, document any behaviors with medication administration (attempted GDR)   magnesium hydroxide 400 MG/5ML suspension Commonly known as:  MILK OF MAGNESIA Take 30 mLs by mouth every 4 (four) hours as needed for mild constipation. Constipation / no BM for 2 days   Magnesium Oxide 250 MG Tabs Take 250 mg by mouth daily.   pantoprazole 20 MG tablet Commonly known as:  PROTONIX Take 20 mg by mouth every morning.   polycarbophil 625 MG tablet Commonly known as:  FIBERCON Take 1,250 mg by mouth 2 (two) times daily. Take with 8 oz of water   potassium chloride 10 MEQ tablet Commonly known as:  K-DUR,KLOR-CON Take 20 mEq by mouth daily. 2 caps = 20 meq , May open and sprinkle on food   propranolol 10 MG tablet Commonly known as:  INDERAL Take 10 mg by mouth 3 (three) times daily.   sennosides-docusate sodium 8.6-50 MG tablet Commonly known as:  SENOKOT-S Take 1 tablet by mouth 2 (two) times daily.   tamsulosin 0.4 MG Caps capsule Commonly known as:  FLOMAX Take 0.4 mg by mouth at bedtime.   traMADol 50 MG tablet Commonly known as:  ULTRAM Take 50 mg by mouth 2 (two) times daily as needed.   traMADol 50 MG tablet Commonly known as:  ULTRAM Take 1 tablet (50 mg total) by mouth 3 (three) times daily. Hold for sedation       Review of Systems  Unable to perform ROS: Dementia  Constitutional: Negative for activity change, appetite change, chills, diaphoresis and fever.  HENT: Negative for congestion, mouth sores, nosebleeds, postnasal drip, sneezing, sore throat, trouble swallowing and voice change.   Respiratory: Negative for apnea, cough, choking, chest tightness, shortness of breath and wheezing.   Cardiovascular: Negative for chest pain, palpitations and leg swelling.  Gastrointestinal: Negative for abdominal distention, abdominal pain, constipation, diarrhea and nausea.  Genitourinary:  Negative for difficulty urinating, dysuria, frequency and urgency.  Musculoskeletal: Negative for back pain, gait problem and myalgias. Arthralgias: typical arthritis.  Skin: Negative for color change, pallor, rash and wound.  Neurological: Positive for weakness. Negative for dizziness, tremors, syncope, speech difficulty, numbness and headaches.  Psychiatric/Behavioral: Positive for agitation and behavioral problems. The patient is nervous/anxious.   All other systems reviewed and are negative.   Immunization History  Administered Date(s) Administered  . Influenza Whole 01/02/2010  . Influenza-Unspecified 12/04/2013, 11/24/2014, 12/05/2015, 12/04/2016  . PPD Test 12/21/2015, 12/08/2016  . Pneumococcal-Unspecified 10/14/2012  . Td 01/10/2010   Pertinent  Health Maintenance Due  Topic Date Due  . COLONOSCOPY  08/23/2000  . PNA vac Low Risk Adult (1 of 2 - PCV13) 08/24/2015  . INFLUENZA VACCINE  10/08/2017   Fall Risk  07/27/2012  Falls in the past year? No   Functional Status Survey:    Vitals:   08/21/17 1453  BP: 132/80  Pulse: 88  Resp: 20  Temp: 97.6 F (36.4 C)  TempSrc: Oral  SpO2: 95%  Weight: 147 lb 9.6 oz (67 kg)  Height: 5' 5"  (1.651 m)   Body mass index is 24.56 kg/m. Physical Exam  Constitutional: Vital signs are normal. He appears well-developed and well-nourished. He is active and cooperative. He does not appear ill. No distress.  HENT:  Head: Normocephalic and atraumatic.  Mouth/Throat: Uvula is midline, oropharynx is clear and moist and mucous membranes are normal. Mucous membranes are not pale, not dry and not cyanotic.  Eyes: Pupils are equal, round, and reactive to light. Conjunctivae, EOM and lids are normal.  Neck: Trachea normal, normal range of motion and full passive range of motion without pain. Neck supple. No JVD present. No tracheal deviation, no edema and no erythema present. No thyromegaly present.  Cardiovascular: Normal rate, regular  rhythm, normal heart sounds, intact distal pulses and normal pulses. Exam reveals no gallop, no distant heart sounds and no friction rub.  No murmur heard. Pulses:      Dorsalis pedis pulses are 2+ on the right side, and 2+ on the left side.  No edema  Pulmonary/Chest: Effort normal and breath sounds normal. No accessory muscle usage. No respiratory distress. He has no decreased breath sounds. He has no wheezes. He has no rhonchi. He has no rales. He exhibits no tenderness.  Abdominal: Soft. Normal appearance and bowel sounds are normal. He exhibits no distension and no ascites. There is no tenderness.  Genitourinary:  Genitourinary Comments: incontinent  Musculoskeletal: Normal range of motion. He exhibits no edema or tenderness.  Expected osteoarthritis, stiffness; Bilateral Calves soft, supple. Negative Homan's Sign. B- pedal pulses equal; generalized weakness. Mobile with hoyer lift  Neurological: He is alert. He displays atrophy. He exhibits abnormal muscle tone. Coordination and gait abnormal.  Skin: Skin is warm, dry and intact. He is not diaphoretic. No cyanosis. No pallor. Nails show no clubbing.  Psychiatric: Thought content normal. His affect is blunt. He is agitated (at times). Cognition and memory are impaired. He expresses impulsivity and inappropriate judgment. He is noncommunicative. He exhibits abnormal recent memory.  Nursing note and vitals reviewed.   Labs reviewed: Recent Labs    01/22/17 0812 07/28/17 0600  NA 138 135  K 4.4 4.1  CL 103 105  CO2 27 25  GLUCOSE 95 92  BUN 31* 27*  CREATININE 0.98 0.97  CALCIUM 9.6 9.1  MG  --  1.6*   Recent Labs    01/22/17 0812 07/28/17 0600  AST 14* 22  ALT 15* 18  ALKPHOS 51 47  BILITOT 0.6 0.7  PROT 7.5 7.4  ALBUMIN 4.3 4.1   Recent Labs    01/22/17 0812 07/28/17 0600  WBC 6.3 5.4  NEUTROABS 3.6 3.2  HGB 13.6 13.3  HCT 41.8 39.5*  MCV 89.4 88.7  PLT 131* 162   Lab Results  Component Value Date   TSH  1.839 07/28/2017   No results found for: HGBA1C Lab Results  Component Value Date   CHOL 86 07/12/2012   HDL 51 01/08/2010   LDLCALC 126 (H) 01/08/2010   TRIG 88 07/12/2012   CHOLHDL 3.8 Ratio 01/08/2010    Significant Diagnostic Results in last 30 days:  No results found.  Assessment/Plan Siddhant was seen today  for medical management of chronic issues.  Diagnoses and all orders for this visit:  Benign prostatic hyperplasia with urinary frequency  Overactive bladder  Anxiety disorder, unspecified type   Above listed conditions stable  Continue current medication regimen  Continue to assist pt with ADLs, meals as needed  Redirect pt as necessary  Safety Precautions  Fall precautions  Monitor for worsening behaviors  Family/ staff Communication:   Total Time:  Documentation:  Face to Face:  Family/Phone:   Labs/tests ordered:  CBC, met C, mag+, TSH, B12, D  Medication list reviewed and assessed for continued appropriateness. Monthly medication orders reviewed and signed.  Vikki Ports, NP-C Geriatrics [facility] [phone],  [address] Cell Phone (Mon-Fri 8am-5pm):  [phone] On Call:  [phone] & follow prompts after 5pm & weekends Office Phone:  [phone] Office Fax:  [phone]

## 2017-09-05 NOTE — Assessment & Plan Note (Signed)
Stable. Incontinent. On Flomax 0.4 mg Q Day

## 2017-09-05 NOTE — Assessment & Plan Note (Signed)
Stable. Persistent. On Lamictal 25 mg TID and Prozac 20 mg po Q Day as well as Propranolol 10 mg TID.

## 2017-09-05 NOTE — Assessment & Plan Note (Signed)
No change. Incontinent of urine and bowel.

## 2017-09-07 ENCOUNTER — Encounter
Admission: RE | Admit: 2017-09-07 | Discharge: 2017-09-07 | Disposition: A | Discharge status: Home / Self Care | Payer: Medicare Other | Source: Ambulatory Visit | Attending: Internal Medicine | Admitting: Internal Medicine

## 2017-09-24 ENCOUNTER — Encounter: Payer: Self-pay | Admitting: Adult Health

## 2017-09-24 ENCOUNTER — Non-Acute Institutional Stay (SKILLED_NURSING_FACILITY): Payer: Medicare Other | Admitting: Adult Health

## 2017-09-24 DIAGNOSIS — I110 Hypertensive heart disease with heart failure: Secondary | ICD-10-CM | POA: Diagnosis not present

## 2017-09-24 DIAGNOSIS — K219 Gastro-esophageal reflux disease without esophagitis: Secondary | ICD-10-CM

## 2017-09-24 DIAGNOSIS — E876 Hypokalemia: Secondary | ICD-10-CM

## 2017-09-24 DIAGNOSIS — G8929 Other chronic pain: Secondary | ICD-10-CM

## 2017-09-24 DIAGNOSIS — I6350 Cerebral infarction due to unspecified occlusion or stenosis of unspecified cerebral artery: Secondary | ICD-10-CM

## 2017-09-24 DIAGNOSIS — R52 Pain, unspecified: Secondary | ICD-10-CM

## 2017-09-24 DIAGNOSIS — F3340 Major depressive disorder, recurrent, in remission, unspecified: Secondary | ICD-10-CM

## 2017-09-24 DIAGNOSIS — N401 Enlarged prostate with lower urinary tract symptoms: Secondary | ICD-10-CM

## 2017-09-24 DIAGNOSIS — I5022 Chronic systolic (congestive) heart failure: Secondary | ICD-10-CM

## 2017-09-24 DIAGNOSIS — R350 Frequency of micturition: Secondary | ICD-10-CM

## 2017-09-24 NOTE — Progress Notes (Signed)
Location:   [facility] Room Number: 323-A Place of Service:  SNF ([id number])   CODE STATUS: Full Code  Allergies  Allergen Reactions  . Cefazolin Rash  . Penicillins     REACTION: unknown allergy - hx since childhood    Chief Complaint  Patient presents with  . Medical Management of Chronic Issues    Cva; chf; hypertension     HPI:  He is a 67 year old long term resident of this facility being seen for the management of his chronic illnesses; cva; chf; hypertension. He is unable to participate in the hpi or ros. There are no reports of edema; changes in appetite; no uncontrolled pain. There are no nursing concerns at this time.   Past Medical History:  Diagnosis Date  . Alcoholic hepatitis 2001  . Anxiety   . Chronic pain   . 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 Depression, major, in remission (HCC) 05/10/2015  . Epistaxis 2001   required cauterization  . Fracture of left humerus   . GERD (gastroesophageal reflux disease)   . History of alcoholism (HCC)   . History of pancreatitis 05/10/2015  . Hypertonicity of bladder   . Lumbar spondylitis (HCC)   . Osteoporosis 05/10/2015  . Stroke (HCC)   . 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   . Thrombus of left atrial appendage 2003  . Urinary incontinence     Past Surgical History:  Procedure Laterality Date  . FEMUR IM NAIL Left 06/25/2012   Procedure: INTRAMEDULLARY (IM) NAIL FEMORAL;  Surgeon: Shelda Pal, MD;  Location: [facility];  Service: Orthopedics;  Laterality: Left;  . HAND SURGERY Bilateral 2003   crush injuries with multitrauma  . KNEE SURGERY  1996    Social History   Socioeconomic History  . Marital status: Divorced    Spouse name: Not on file  . Number of children: 0  . Years of education: Not on file  . Highest education level: Not on file  Occupational History  . Not on file  Social Needs  . Financial resource  strain: Not on file  . Food insecurity:    Worry: Not on file    Inability: Not on file  . Transportation needs:    Medical: Not on file    Non-medical: Not on file  Tobacco Use  . Smoking status: Never Smoker  . Smokeless tobacco: Never Used  Substance and Sexual Activity  . Alcohol use: No    Comment: former drinker (6 pack a week)  . Drug use: No  . Sexual activity: Not on file  Lifestyle  . Physical activity:    Days per week: Not on file    Minutes per session: Not on file  . Stress: Not on file  Relationships  . Social connections:    Talks on phone: Not on file    Gets together: Not on file    Attends religious service: Not on file    Active member of club or organization: Not on file    Attends meetings of clubs or organizations: Not on file    Relationship status: Not on file  . Intimate partner violence:    Fear of current or ex partner: Not on file    Emotionally abused: Not on file    Physically abused: Not on file    Forced sexual activity: Not on file  Other Topics Concern  . Not on  file  Social History Narrative   [facility]   Divorced   No children   Former drinker   Never smoker   Full Code   Family History  Problem Relation Age of Onset  . Diabetes type II Mother   . Hypertension Mother   . Diabetes type II Father   . Hypertension Father   . Dementia Father   . Hypertension Sister       VITAL SIGNS BP 122/66   Pulse 78   Temp 97.7 F (36.5 C)   Resp 18   Ht 5\' 5"  (1.651 m)   Wt 146 lb (66.2 kg)   SpO2 100%   BMI 24.30 kg/m   Outpatient Encounter Medications as of 09/24/2017  Medication Sig  . acetaminophen (TYLENOL) 500 MG tablet Take 2 tablets (1,000 mg total) by mouth 3 (three) times daily.  . baclofen (LIORESAL) 10 MG tablet Take 10 mg by mouth 3 (three) times daily.  . cholecalciferol (VITAMIN D) 1000 UNITS tablet Take 2,000 Units by mouth every morning. 2 tabs  . cloNIDine (CATAPRES) 0.1 MG  tablet Take 0.05 mg by mouth daily. 1/2 tablet  . clopidogrel (PLAVIX) 75 MG tablet Take 75 mg by mouth every morning.   . cyanocobalamin 1000 MCG tablet Take 1,000 mcg by mouth daily.  Marland Kitchen. FLUoxetine (PROZAC) 20 MG tablet Take 30 mg by mouth daily.   . folic acid (FOLVITE) 400 MCG tablet Take 400 mcg by mouth daily.  Marland Kitchen. gabapentin (NEURONTIN) 100 MG capsule Take 100 mg by mouth 3 (three) times daily.  . Infant Care Products [facility](DERMACLOUD EX) Apply liberal amount topically to area of skin irritation prn. OK to leave at bedside.  . lamoTRIgine (LAMICTAL) 25 MG tablet Take 25 mg by mouth 3 (three) times daily. FOR MOOD, document any behaviors with medication administration (attempted GDR)  . magnesium hydroxide (MILK OF MAGNESIA) 400 MG/5ML suspension Take 30 mLs by mouth every 4 (four) hours as needed for mild constipation. Constipation / no BM for 2 days  . Magnesium Oxide 250 MG TABS Take 250 mg by mouth daily.  . pantoprazole (PROTONIX) 20 MG tablet Take 20 mg by mouth every morning.   . polycarbophil (FIBERCON) 625 MG tablet Take 1,250 mg by mouth 2 (two) times daily. Take with 8 oz of water  . potassium chloride (K-DUR,KLOR-CON) 10 MEQ tablet Take 20 mEq by mouth daily. 2 caps = 20 meq , May open and sprinkle on food  . propranolol (INDERAL) 10 MG tablet Take 10 mg by mouth 3 (three) times daily.  . sennosides-docusate sodium (SENOKOT-S) 8.6-50 MG tablet Take 1 tablet by mouth 2 (two) times daily.  . tamsulosin (FLOMAX) 0.4 MG CAPS Take 0.4 mg by mouth at bedtime.   . traMADol (ULTRAM) 50 MG tablet Take 50 mg by mouth 2 (two) times daily as needed.  . traMADol (ULTRAM) 50 MG tablet Take 1 tablet (50 mg total) by mouth 3 (three) times daily. Hold for sedation   No facility-administered encounter medications on file as of 09/24/2017.      SIGNIFICANT DIAGNOSTIC EXAMS  LABS REVIEWED: TODAY:   07-28-17: wbc 5.4; hgb 13.3; hct 39.5; mcv 88.7; plt 162; glucose 92; bun 27; creat 0.97; k+ 4.1; na++  135; ca 9.1 liver normal albumin 4.1; mag 1.6; tsh 1.839; vit B 12: 347; vit D 31.9  Review of Systems  Unable to perform ROS: Dementia (unable to participate )    Physical Exam  Constitutional: He appears well-developed and well-nourished. No distress.  Neck: No thyromegaly present.  Cardiovascular: Normal rate, regular rhythm, normal heart sounds and intact distal pulses.  Pulmonary/Chest: Effort normal and breath sounds normal. No respiratory distress.  Abdominal: Soft. Bowel sounds are normal. He exhibits no distension. There is no tenderness.  Musculoskeletal: He exhibits no edema.  Able to move all extremities   Lymphadenopathy:    He has no cervical adenopathy.  Neurological: He is alert.  Skin: Skin is warm and dry. He is not diaphoretic.  Psychiatric: He has a normal mood and affect.     ASSESSMENT/ PLAN:  TODAY:   1. Cerebral artery occlusion with cerebral infarction: is stable will continue plavix 75 mg daily   2. Systolic congestive heart failure, chronic: is stable will continue inderal 10 mg three times daily   3. Hypertensive heart disease with chronic systolic congestive heart failure: is stable b/p 122/66: will continue clonidine 0.05 mg daily   4. Gastroesophageal reflux disease without esophagitis: is stable will continue protonix 20 mg daily   5. 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 Chronic constipation: is stable will continue fibercon 1250 mg twice daily and senna s twice daily   7. BPH with urinary frequency: is stable will continue flomax 0.4 mg daily   8.  Major depressive disorder, recurrent, in remission/mood disorder: is stable will continue prozac 30 mg daily lamictal 25 mg three times daily   9. Chronic generalized pain disorder: is stable will continue tylenol 1 gm three times daily ultram 50 mg three times daily and twice  daily as needed; neurontin 100 mg three times daily and baclofen 10 mg three times daily   10. Hypokalemia: is stable will continue k+ 20 meq daily     MD is aware of resident's narcotic use and is in agreement with current plan of care. We will attempt to wean resident as apropriate   Synthia Innocent NP [facility]  Contact [phone] Monday through Friday 8am- 5pm  After hours call [phone]

## 2017-10-04 DIAGNOSIS — I110 Hypertensive heart disease with heart failure: Secondary | ICD-10-CM | POA: Insufficient documentation

## 2017-10-04 DIAGNOSIS — E876 Hypokalemia: Secondary | ICD-10-CM | POA: Insufficient documentation

## 2017-10-04 DIAGNOSIS — I5020 Unspecified systolic (congestive) heart failure: Secondary | ICD-10-CM

## 2017-10-08 ENCOUNTER — Encounter
Admission: RE | Admit: 2017-10-08 | Discharge: 2017-10-08 | Disposition: A | Discharge status: Home / Self Care | Payer: Medicare Other | Source: Ambulatory Visit | Attending: Internal Medicine | Admitting: Internal Medicine

## 2017-10-27 ENCOUNTER — Encounter: Payer: Self-pay | Admitting: Adult Health

## 2017-10-27 ENCOUNTER — Non-Acute Institutional Stay (SKILLED_NURSING_FACILITY): Payer: Medicare Other | Admitting: Adult Health

## 2017-10-27 DIAGNOSIS — N401 Enlarged prostate with lower urinary tract symptoms: Secondary | ICD-10-CM

## 2017-10-27 DIAGNOSIS — F419 Anxiety disorder, unspecified: Secondary | ICD-10-CM

## 2017-10-27 DIAGNOSIS — I6350 Cerebral infarction due to unspecified occlusion or stenosis of unspecified cerebral artery: Secondary | ICD-10-CM

## 2017-10-27 DIAGNOSIS — F3340 Major depressive disorder, recurrent, in remission, unspecified: Secondary | ICD-10-CM

## 2017-10-27 DIAGNOSIS — F1097 Alcohol use, unspecified with alcohol-induced persisting dementia: Secondary | ICD-10-CM

## 2017-10-27 DIAGNOSIS — R350 Frequency of micturition: Secondary | ICD-10-CM

## 2017-10-27 DIAGNOSIS — M47817 Spondylosis without myelopathy or radiculopathy, lumbosacral region: Secondary | ICD-10-CM | POA: Diagnosis not present

## 2017-10-27 DIAGNOSIS — F39 Unspecified mood [affective] disorder: Secondary | ICD-10-CM | POA: Diagnosis not present

## 2017-10-27 NOTE — Progress Notes (Signed)
[facility]  [facility] Room Number: 323A Place of Service:  SNF [phone]) Provider:  Kenard GowerMedina-Vargas, Keaghan Bowens, NP  Patient Care Team: Lauro RegulusAnderson, Marshall W, MD as PCP - General (Unknown Physician Specialty) Durene Romanslin, Matthew, MD as Consulting Physician (Orthopedic Surgery)  Extended Emergency Contact Information Primary Emergency Contact: Tula NakayamaSockwell,[name] Address: [address]          [place], [place] [address] [place] Home Phone: [phone] Mobile Phone: [phone] Relation: Sister  Code Status:  FULL  Goals of care: Advanced Directive information Advanced Directives 10/27/2017  Does Patient Have a Medical Advance Directive? No  Does patient want to make changes to medical advance directive? -  Would patient like information on creating a medical advance directive? No - Patient declined  Pre-existing out of facility DNR order (yellow form or pink MOST form) -     Chief Complaint  Patient presents with  . Medical Management of Chronic Issues    Routine Visit    HPI:  Pt is a 67 y.o. male seen today for medical management of chronic diseases. He has PMH of alcoholic hepatitis, anxiety, chronic pain, alcoholism, pancreatitis, substance abuse (MJA and cocaine), dementia associated with alcoholism, and depression. He was reported to be verbally abusive to staff. He was seen by the station and no inappropriate behavior was noted. He agreed that he will not be verbally abusive to staff.   Past Medical History:  Diagnosis Date  . Alcoholic hepatitis 2001  . Anxiety   . Chronic pain   . 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 Depression, major, in remission (HCC) 05/10/2015  . Epistaxis 2001   required cauterization  . Fracture of left humerus   . GERD (gastroesophageal reflux disease)   . History of alcoholism (HCC)   . History of pancreatitis 05/10/2015  . Hypertonicity of bladder   . Lumbar  spondylitis (HCC)   . Osteoporosis 05/10/2015  . Stroke (HCC)   . 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   . Thrombus of left atrial appendage 2003  . Urinary incontinence    Past Surgical History:  Procedure Laterality Date  . FEMUR IM NAIL Left 06/25/2012   Procedure: INTRAMEDULLARY (IM) NAIL FEMORAL;  Surgeon: Shelda PalMatthew D Olin, MD;  Location: [facility];  Service: Orthopedics;  Laterality: Left;  . HAND SURGERY Bilateral 2003   crush injuries with multitrauma  . KNEE SURGERY  1996    Allergies  Allergen Reactions  . Cefazolin Rash  . Penicillins     REACTION: unknown allergy - hx since childhood    Outpatient Encounter Medications as of 10/27/2017  Medication Sig  . acetaminophen (TYLENOL) 500 MG tablet Take 2 tablets (1,000 mg total) by mouth 3 (three) times daily.  . baclofen (LIORESAL) 10 MG tablet Take 10 mg by mouth 3 (three) times daily.  . cholecalciferol (VITAMIN D) 1000 UNITS tablet Take 2,000 Units by mouth every morning. 2 tabs  . cloNIDine (CATAPRES) 0.1 MG tablet Take 0.05 mg by mouth daily. 1/2 tablet  . clopidogrel (PLAVIX) 75 MG tablet Take 75 mg by mouth every morning.   . cyanocobalamin 1000 MCG tablet Take 1,000 mcg by mouth daily.  Marland Kitchen. FLUoxetine (PROZAC) 10 MG tablet Take 10 mg by mouth daily. Give in addition to 20 mg tab to = 30 mg total  . FLUoxetine (PROZAC) 20 MG tablet Take 20 mg by mouth daily.   . folic acid (FOLVITE) 400 MCG tablet  Take 400 mcg by mouth daily.  Marland Kitchen gabapentin (NEURONTIN) 100 MG capsule Take 200 mg by mouth 3 (three) times daily.   . Infant Care Products [facility] EX) Apply liberal amount topically to area of skin irritation prn. OK to leave at bedside.  . lamoTRIgine (LAMICTAL) 25 MG tablet Take 25 mg by mouth 3 (three) times daily. FOR MOOD, document any behaviors with medication administration (attempted GDR)  . magnesium hydroxide (MILK OF MAGNESIA) 400 MG/5ML suspension Take 30 mLs by mouth every 4 (four) hours as needed for  mild constipation. Constipation / no BM for 2 days  . Magnesium Oxide 250 MG TABS Take 250 mg by mouth daily.  . NON FORMULARY Diet Type : Mechanical Soft Diet (All upper teeth are now extracted; no plans for Dentures)  . pantoprazole (PROTONIX) 20 MG tablet Take 20 mg by mouth every morning.   . polycarbophil (FIBERCON) 625 MG tablet Take 1,250 mg by mouth 2 (two) times daily. Take with 8 oz of water  . potassium chloride (K-DUR,KLOR-CON) 10 MEQ tablet Take 20 mEq by mouth daily. 2 caps = 20 meq , May open and sprinkle on food  . propranolol (INDERAL) 10 MG tablet Take 10 mg by mouth 3 (three) times daily.  . sennosides-docusate sodium (SENOKOT-S) 8.6-50 MG tablet Take 1 tablet by mouth 2 (two) times daily.  . tamsulosin (FLOMAX) 0.4 MG CAPS Take 0.4 mg by mouth at bedtime.   . traMADol (ULTRAM) 50 MG tablet Take 50 mg by mouth 2 (two) times daily as needed.  . traMADol (ULTRAM) 50 MG tablet Take 1 tablet (50 mg total) by mouth 3 (three) times daily. Hold for sedation   No facility-administered encounter medications on file as of 10/27/2017.     Review of Systems  GENERAL: No change in appetite, no fatigue, no weight changes, no fever, chills or weakness MOUTH and THROAT: Denies oral discomfort, gingival pain or bleeding RESPIRATORY: no cough, SOB, DOE, wheezing, hemoptysis CARDIAC: No chest pain, edema or palpitations GI: No abdominal pain, diarrhea, constipation, heart burn, nausea or vomiting PSYCHIATRIC: Denies feelings of depression or anxiety. No report of hallucinations, insomnia, paranoia, or agitation   Immunization History  Administered Date(s) Administered  . Influenza Whole 01/02/2010  . Influenza-Unspecified 12/04/2013, 11/24/2014, 12/05/2015, 12/04/2016  . PPD Test 12/21/2015, 12/08/2016  . Pneumococcal-Unspecified 10/14/2012  . Td 01/10/2010   Pertinent  Health Maintenance Due  Topic Date Due  . COLONOSCOPY  08/23/2000  . PNA vac Low Risk Adult (1 of 2 - PCV13)  08/24/2015  . INFLUENZA VACCINE  10/08/2017   Fall Risk  07/27/2012  Falls in the past year? No      Vitals:   10/27/17 1140  BP: 122/64  Pulse: 78  Resp: 20  Temp: (!) 97.4 F (36.3 C)  TempSrc: Oral  SpO2: 98%  Weight: 141 lb (64 kg)  Height: 5\' 5"  (1.651 m)   Body mass index is 23.46 kg/m.  Physical Exam  GENERAL APPEARANCE: Well nourished. In no acute distress. Normal body habitus SKIN:  Skin is warm and dry.  MOUTH and THROAT: Lips are without lesions. Oral mucosa is moist and without lesions. Tongue is normal in shape, size, and color and without lesions RESPIRATORY: Breathing is even & unlabored, BS CTAB CARDIAC: RRR, no murmur,no extra heart sounds, no edema GI: Abdomen soft, normal BS, no masses, no tenderness EXTREMITIES:  Able to move X 4 extremities PSYCHIATRIC: Alert to self, disoriented to time and place. Affect and behavior  are appropriate   Labs reviewed: Recent Labs    01/22/17 0812 07/28/17 0600  NA 138 135  K 4.4 4.1  CL 103 105  CO2 27 25  GLUCOSE 95 92  BUN 31* 27*  CREATININE 0.98 0.97  CALCIUM 9.6 9.1  MG  --  1.6*   Recent Labs    01/22/17 0812 07/28/17 0600  AST 14* 22  ALT 15* 18  ALKPHOS 51 47  BILITOT 0.6 0.7  PROT 7.5 7.4  ALBUMIN 4.3 4.1   Recent Labs    01/22/17 0812 07/28/17 0600  WBC 6.3 5.4  NEUTROABS 3.6 3.2  HGB 13.6 13.3  HCT 41.8 39.5*  MCV 89.4 88.7  PLT 131* 162   Lab Results  Component Value Date   TSH 1.839 07/28/2017   No results found for: HGBA1C Lab Results  Component Value Date   CHOL 86 07/12/2012   HDL 51 01/08/2010   LDLCALC 126 (H) 01/08/2010   TRIG 88 07/12/2012   CHOLHDL 3.8 Ratio 01/08/2010     Assessment/Plan  1. Cerebral artery occlusion with cerebral infarction (HCC) - stable, continue Plavix 75 mg daily   2. SPONDYLOSIS, LUMBAR - continue Baclofen 10 mg 1 tab TID, Gabapentin 100 mg 2 capsules = 200 mg TID, Tramadol 50 mg TID and BID PRN   3. Benign prostatic  hyperplasia with urinary frequency - stable, continue Tamsulosin 0.4 mg 1 capsule daily   4. Mood disorder (HCC) - mood is stable, continue Lamotrigine 25 mg TID   5. Anxiety - mood is stable, continue Propanolol 10 mg 1 tab TID   6. Major depressive disorder, recurrent, in remission (HCC) - has periods of being verbally abusive to staff, will continue Prozac 30 mg (20 mg + 10 mg) daily   7. 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  Labs/tests ordered:  None  Goals of care:   Long-term care   Kenard GowerMonina Medina-Vargas, NP [facility] [phone] (Monday-Friday 8:00 a.m. - 5:00 p.m.) [phone] (after hours)

## 2017-11-04 ENCOUNTER — Encounter: Payer: Self-pay | Admitting: Adult Health

## 2017-11-04 ENCOUNTER — Non-Acute Institutional Stay (SKILLED_NURSING_FACILITY): Payer: Medicare Other | Admitting: Adult Health

## 2017-11-04 DIAGNOSIS — F0151 Vascular dementia with behavioral disturbance: Secondary | ICD-10-CM

## 2017-11-04 DIAGNOSIS — F3340 Major depressive disorder, recurrent, in remission, unspecified: Secondary | ICD-10-CM

## 2017-11-04 DIAGNOSIS — F01518 Vascular dementia, unspecified severity, with other behavioral disturbance: Secondary | ICD-10-CM

## 2017-11-04 DIAGNOSIS — F1097 Alcohol use, unspecified with alcohol-induced persisting dementia: Secondary | ICD-10-CM
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
## 2017-11-08 ENCOUNTER — Encounter
Admission: RE | Admit: 2017-11-08 | Discharge: 2017-11-08 | Disposition: A | Discharge status: Home / Self Care | Payer: Medicare Other | Source: Ambulatory Visit | Attending: Internal Medicine | Admitting: Internal Medicine

## 2017-11-23 ENCOUNTER — Other Ambulatory Visit: Payer: Self-pay

## 2017-11-23 MED ORDER — TRAMADOL HCL 50 MG PO TABS: 50.0000 mg | ORAL_TABLET | Freq: Three times a day (TID) | ORAL | 0 refills | Status: DC

## 2017-11-23 NOTE — Telephone Encounter (Signed)
Rx sent to [facility] phone : [phone] , fax : [phone]  

## 2017-11-25 ENCOUNTER — Other Ambulatory Visit: Payer: Self-pay

## 2017-11-25 MED ORDER — TRAMADOL HCL 50 MG PO TABS: 50.0000 mg | ORAL_TABLET | Freq: Three times a day (TID) | ORAL | 0 refills | Status: DC

## 2017-11-25 MED ORDER — TRAMADOL HCL 50 MG PO TABS: 50.0000 mg | ORAL_TABLET | Freq: Two times a day (BID) | ORAL | 0 refills | Status: DC | PRN

## 2017-11-25 NOTE — Telephone Encounter (Signed)
Rx sent to [facility] phone : [phone] , fax : [phone]  

## 2017-11-26 ENCOUNTER — Encounter: Payer: Self-pay | Admitting: Adult Health

## 2017-11-26 ENCOUNTER — Non-Acute Institutional Stay (SKILLED_NURSING_FACILITY): Payer: Medicare Other | Admitting: Adult Health

## 2017-11-26 DIAGNOSIS — I6350 Cerebral infarction due to unspecified occlusion or stenosis of unspecified cerebral artery: Secondary | ICD-10-CM | POA: Diagnosis not present

## 2017-11-26 DIAGNOSIS — K219 Gastro-esophageal reflux disease without esophagitis: Secondary | ICD-10-CM | POA: Diagnosis not present

## 2017-11-26 DIAGNOSIS — I5022 Chronic systolic (congestive) heart failure: Secondary | ICD-10-CM | POA: Diagnosis not present

## 2017-11-26 DIAGNOSIS — I110 Hypertensive heart disease with heart failure: Secondary | ICD-10-CM | POA: Diagnosis not present

## 2017-11-26 NOTE — Progress Notes (Signed)
Location:   [facility] Room Number: 323A Place of Service:  SNF (31)   CODE STATUS: DNR  Allergies  Allergen Reactions  . Cefazolin Rash  . Penicillins     REACTION: unknown allergy - hx since childhood    Chief Complaint  Patient presents with  . Medical Management of Chronic Issues    Cva; systolic heart failure; hypertensive heart disease; gerd.     HPI:  He is a 67 year old long term resident of this facility being seen for the management of his chronic illnesses: cva; systolic heart failure; hypertensive heart disease gerd. He is unable to participate in the hpi or ros. There are no reports of any further behavioral issues. No reports of heart burn; no uncontrolled pain.   Past Medical History:  Diagnosis Date  . Alcoholic hepatitis 2001  . Anxiety   . Chronic pain   . 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 Depression, major, in remission (HCC) 05/10/2015  . Epistaxis 2001   required cauterization  . Fracture of left humerus   . GERD (gastroesophageal reflux disease)   . History of alcoholism (HCC)   . History of pancreatitis 05/10/2015  . Hypertonicity of bladder   . Lumbar spondylitis (HCC)   . Osteoporosis 05/10/2015  . Stroke (HCC)   . 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   . Thrombus of left atrial appendage 2003  . Urinary incontinence     Past Surgical History:  Procedure Laterality Date  . FEMUR IM NAIL Left 06/25/2012   Procedure: INTRAMEDULLARY (IM) NAIL FEMORAL;  Surgeon: Shelda Pal, MD;  Location: [facility];  Service: Orthopedics;  Laterality: Left;  . HAND SURGERY Bilateral 2003   crush injuries with multitrauma  . KNEE SURGERY  1996    Social History   Socioeconomic History  . Marital status: Divorced    Spouse name: Not on file  . Number of children: 0  . Years of education: Not on file  . Highest education level: Not on file  Occupational History  .  Not on file  Social Needs  . Financial resource strain: Not on file  . Food insecurity:    Worry: Not on file    Inability: Not on file  . Transportation needs:    Medical: Not on file    Non-medical: Not on file  Tobacco Use  . Smoking status: Never Smoker  . Smokeless tobacco: Never Used  Substance and Sexual Activity  . Alcohol use: No    Comment: former drinker (6 pack a week)  . Drug use: No  . Sexual activity: Not on file  Lifestyle  . Physical activity:    Days per week: Not on file    Minutes per session: Not on file  . Stress: Not on file  Relationships  . Social connections:    Talks on phone: Not on file    Gets together: Not on file    Attends religious service: Not on file    Active member of club or organization: Not on file    Attends meetings of clubs or organizations: Not on file    Relationship status: Not on file  . Intimate partner violence:    Fear of current or ex partner: Not on file    Emotionally abused: Not on file    Physically abused: Not on file    Forced sexual activity: Not on file  Other  Topics Concern  . Not on file  Social History Narrative   Admitted to [facility] 07/15/2012   Divorced   No children   Former drinker   Never smoker   Full Code   Family History  Problem Relation Age of Onset  . Diabetes type II Mother   . Hypertension Mother   . Diabetes type II Father   . Hypertension Father   . Dementia Father   . Hypertension Sister       VITAL SIGNS BP 132/88   Pulse 90   Temp 97.9 F (36.6 C) (Oral)   Resp 18   Ht 5\' 5"  (1.651 m)   Wt 140 lb (63.5 kg)   SpO2 94%   BMI 23.30 kg/m   Outpatient Encounter Medications as of 11/26/2017  Medication Sig  . acetaminophen (TYLENOL) 500 MG tablet Take 2 tablets (1,000 mg total) by mouth 3 (three) times daily.  . baclofen (LIORESAL) 10 MG tablet Take 10 mg by mouth 3 (three) times daily.  . cholecalciferol (VITAMIN D) 1000 UNITS tablet Take 2,000 Units by mouth  every morning. 2 tabs  . cloNIDine (CATAPRES) 0.1 MG tablet Take 0.05 mg by mouth daily. 1/2 tablet  . clopidogrel (PLAVIX) 75 MG tablet Take 75 mg by mouth every morning.   . cyanocobalamin 1000 MCG tablet Take 1,000 mcg by mouth daily.  Marland Kitchen FLUoxetine (PROZAC) 10 MG tablet Take 10 mg by mouth daily. Give in addition to 20 mg tab to = 30 mg total  . FLUoxetine (PROZAC) 20 MG tablet Take 20 mg by mouth daily.   . folic acid (FOLVITE) 400 MCG tablet Take 400 mcg by mouth daily.  Marland Kitchen gabapentin (NEURONTIN) 100 MG capsule Take 200 mg by mouth 3 (three) times daily.   . Infant Care Products [facility] EX) Apply liberal amount topically to area of skin irritation prn. OK to leave at bedside.  . lamoTRIgine (LAMICTAL) 25 MG tablet Take 25 mg by mouth every 12 (twelve) hours. FOR MOOD, document any behaviors with medication administration (attempted GDR)  . lamoTRIgine (LAMICTAL) 25 MG tablet Take 50 mg by mouth daily. 2 tabs (50 mg)  At 2 pm  . magnesium hydroxide (MILK OF MAGNESIA) 400 MG/5ML suspension Take 30 mLs by mouth every 4 (four) hours as needed for mild constipation. Constipation / no BM for 2 days  . Magnesium Oxide 250 MG TABS Take 250 mg by mouth 2 (two) times daily.   . NON FORMULARY Diet Type : Mechanical Soft Diet (All upper teeth are now extracted; no plans for Dentures)  . pantoprazole (PROTONIX) 20 MG tablet Take 20 mg by mouth every morning.   . polycarbophil (FIBERCON) 625 MG tablet Take 1,250 mg by mouth 2 (two) times daily. Take with 8 oz of water  . potassium chloride (K-DUR,KLOR-CON) 10 MEQ tablet Take 20 mEq by mouth daily. 2 caps = 20 meq , May open and sprinkle on food  . propranolol (INDERAL) 10 MG tablet Take 10 mg by mouth 3 (three) times daily.  . sennosides-docusate sodium (SENOKOT-S) 8.6-50 MG tablet Take 1 tablet by mouth 2 (two) times daily.  . tamsulosin (FLOMAX) 0.4 MG CAPS Take 0.4 mg by mouth at bedtime.   . traMADol (ULTRAM) 50 MG tablet Take 1 tablet (50 mg  total) by mouth 2 (two) times daily as needed.  . traMADol (ULTRAM) 50 MG tablet Take 1 tablet (50 mg total) by mouth 3 (three) times daily. Hold for sedation  . [  DISCONTINUED] propranolol (INDERAL) 10 MG tablet Take 10 mg by mouth 3 (three) times daily.   No facility-administered encounter medications on file as of 11/26/2017.      SIGNIFICANT DIAGNOSTIC EXAMS   LABS REVIEWED: PREVIOUS:   07-28-17: wbc 5.4; hgb 13.3; hct 39.5; mcv 88.7; plt 162; glucose 92; bun 27; creat 0.97; k+ 4.1; na++ 135; ca 9.1 liver normal albumin 4.1; mag 1.6; tsh 1.839; vit B 12: 347; vit D 31.9  NO NEW LABS.    Review of Systems  Unable to perform ROS: Dementia (unable to participate )    Physical Exam  Constitutional: He appears well-developed and well-nourished. No distress.  Neck: No thyromegaly present.  Cardiovascular: Normal rate, regular rhythm, normal heart sounds and intact distal pulses.  Pulmonary/Chest: Effort normal and breath sounds normal. No respiratory distress.  Abdominal: Soft. Bowel sounds are normal. He exhibits no distension. There is no tenderness.  Musculoskeletal: Normal range of motion. He exhibits no edema.  Lymphadenopathy:    He has no cervical adenopathy.  Neurological: He is alert.  Skin: Skin is warm and dry. He is not diaphoretic.  Psychiatric: He has a normal mood and affect.     ASSESSMENT/ PLAN:  TODAY:   1. Cerebral artery occlusion with cerebral infarction: is stable will continue plavix 75 mg daily   2. Systolic congestive heart failure, chronic: is stable will continue inderal 10 mg three times daily   3. Hypertensive heart disease with chronic systolic congestive heart failure: is stable b/p 122/66: will continue clonidine 0.05 mg daily   4. Gastroesophageal reflux disease without esophagitis: is stable will continue protonix 20 mg daily   PREVIOUS   5. Alcohol use with alcohol-induced persisting dementia/vascular dementia with behavioral  disturbance: is without change: weight is 140 pounds will monitor   6. Chronic constipation: is stable will continue fibercon 1250 mg twice daily and senna s twice daily   7. BPH with urinary frequency: is stable will continue flomax 0.4 mg daily   8.  Major depressive disorder, recurrent, in remission/mood disorder: is stable will continue prozac 30 mg daily lamictal 25 mg twice daily and 50 mg in the afternoon   9. Chronic generalized pain disorder: is stable will continue tylenol 1 gm three times daily ultram 50 mg three times daily and twice daily as needed; neurontin 100 mg three times daily and baclofen 10 mg three times daily   10. Hypokalemia: is stable will continue k+ 20 meq daily     MD is aware of resident's narcotic use and is in agreement with current plan of care. We will attempt to wean resident as apropriate   Synthia Innocenteborah Green NP [facility]  Contact [phone] Monday through Friday 8am- 5pm  After hours call [phone]

## 2017-12-08 ENCOUNTER — Encounter
Admission: RE | Admit: 2017-12-08 | Discharge: 2017-12-08 | Disposition: A | Discharge status: Home / Self Care | Payer: Medicare Other | Source: Ambulatory Visit | Attending: Internal Medicine | Admitting: Internal Medicine

## 2018-01-08 ENCOUNTER — Encounter
Admission: RE | Admit: 2018-01-08 | Discharge: 2018-01-08 | Disposition: A | Discharge status: Home / Self Care | Payer: Medicare Other | Source: Ambulatory Visit | Attending: Internal Medicine | Admitting: Internal Medicine

## 2018-01-11 ENCOUNTER — Non-Acute Institutional Stay (SKILLED_NURSING_FACILITY): Payer: Medicare Other | Admitting: Adult Health

## 2018-01-11 ENCOUNTER — Encounter: Payer: Self-pay | Admitting: Adult Health

## 2018-01-11 DIAGNOSIS — K5909 Other constipation: Secondary | ICD-10-CM | POA: Insufficient documentation

## 2018-01-11 DIAGNOSIS — F1097 Alcohol use, unspecified with alcohol-induced persisting dementia: Secondary | ICD-10-CM

## 2018-01-11 DIAGNOSIS — F3340 Major depressive disorder, recurrent, in remission, unspecified: Secondary | ICD-10-CM

## 2018-01-11 DIAGNOSIS — F39 Unspecified mood [affective] disorder: Secondary | ICD-10-CM

## 2018-01-11 DIAGNOSIS — R350 Frequency of micturition: Secondary | ICD-10-CM

## 2018-01-11 DIAGNOSIS — N401 Enlarged prostate with lower urinary tract symptoms: Secondary | ICD-10-CM

## 2018-01-11 NOTE — Progress Notes (Signed)
[facility]:   [facility] Room Number: [id number] Place of Service:  SNF (31)   CODE STATUS: Full Code  Allergies  Allergen Reactions  . Cefazolin Rash  . Penicillins     REACTION: unknown allergy - hx since childhood    Chief Complaint  Patient presents with  . Medical Management of Chronic Issues    Alcohol use with alcohol-induced persisting dementia; chronic constipation; benign prostatic hyperplasia with urinary frequency; major depressive disorder recurrent in remission; mood disorder.     HPI:  He is a 67 year old long term resident of this facility being seen for the management of his chronic illnesses: alcohol induced dementia; constipation; bph; depression. He is unable to fully participate in the hpi or ros. There are no reports of agitation or anxiety present. There are no reports of changes in appetite; no constipation; no uncontrolled pain.   Past Medical History:  Diagnosis Date  . Alcoholic hepatitis 2001  . Anxiety   . Chronic pain   . 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 Depression, major, in remission (HCC) 05/10/2015  . Epistaxis 2001   required cauterization  . Fracture of left humerus   . GERD (gastroesophageal reflux disease)   . History of alcoholism (HCC)   . History of pancreatitis 05/10/2015  . Hypertonicity of bladder   . Lumbar spondylitis (HCC)   . Osteoporosis 05/10/2015  . Stroke (HCC)   . 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   . Thrombus of left atrial appendage 2003  . Urinary incontinence     Past Surgical History:  Procedure Laterality Date  . FEMUR IM NAIL Left 06/25/2012   Procedure: INTRAMEDULLARY (IM) NAIL FEMORAL;  Surgeon: Shelda Pal, MD;  Location: [facility];  Service: Orthopedics;  Laterality: Left;  . HAND SURGERY Bilateral 2003   crush injuries with multitrauma  . KNEE SURGERY  1996    Social History   Socioeconomic History  . Marital  status: Divorced    Spouse name: Not on file  . Number of children: 0  . Years of education: Not on file  . Highest education level: Not on file  Occupational History  . Not on file  Social Needs  . Financial resource strain: Not on file  . Food insecurity:    Worry: Not on file    Inability: Not on file  . Transportation needs:    Medical: Not on file    Non-medical: Not on file  Tobacco Use  . Smoking status: Never Smoker  . Smokeless tobacco: Never Used  Substance and Sexual Activity  . Alcohol use: No    Comment: former drinker (6 pack a week)  . Drug use: No  . Sexual activity: Not on file  Lifestyle  . Physical activity:    Days per week: Not on file    Minutes per session: Not on file  . Stress: Not on file  Relationships  . Social connections:    Talks on phone: Not on file    Gets together: Not on file    Attends religious service: Not on file    Active member of club or organization: Not on file    Attends meetings of clubs or organizations: Not on file    Relationship status: Not on file  . Intimate partner violence:    Fear of current or ex partner: Not on file    Emotionally abused: Not on  file    Physically abused: Not on file    Forced sexual activity: Not on file  Other Topics Concern  . Not on file  Social History Narrative   Admitted to [facility] 07/15/2012   Divorced   No children   Former drinker   Never smoker   Full Code   Family History  Problem Relation Age of Onset  . Diabetes type II Mother   . Hypertension Mother   . Diabetes type II Father   . Hypertension Father   . Dementia Father   . Hypertension Sister       VITAL SIGNS BP 109/70   Pulse 86   Temp 97.7 F (36.5 C)   Resp 18   Ht 5\' 5"  (1.651 m)   Wt 140 lb (63.5 kg)   SpO2 97%   BMI 23.30 kg/m   Outpatient Encounter Medications as of 01/11/2018  Medication Sig  . acetaminophen (TYLENOL) 500 MG tablet Take 2 tablets (1,000 mg total) by mouth 3 (three)  times daily.  . baclofen (LIORESAL) 10 MG tablet Take 10 mg by mouth 3 (three) times daily.  . cholecalciferol (VITAMIN D) 1000 UNITS tablet Take 2,000 Units by mouth every morning. 2 tabs  . cloNIDine (CATAPRES) 0.1 MG tablet Take 0.05 mg by mouth daily. 1/2 tablet  . clopidogrel (PLAVIX) 75 MG tablet Take 75 mg by mouth every morning.   . cyanocobalamin 1000 MCG tablet Take 1,000 mcg by mouth daily.  Marland Kitchen FLUoxetine (PROZAC) 10 MG tablet Take 10 mg by mouth daily. Give in addition to 20 mg tab to = 30 mg total  . FLUoxetine (PROZAC) 20 MG tablet Take 20 mg by mouth daily.   . folic acid (FOLVITE) 400 MCG tablet Take 400 mcg by mouth daily.  Marland Kitchen gabapentin (NEURONTIN) 100 MG capsule Take 200 mg by mouth 3 (three) times daily.   . Infant Care Products [facility] EX) Apply liberal amount topically to area of skin irritation prn. OK to leave at bedside.  . lamoTRIgine (LAMICTAL) 25 MG tablet Take 25 mg by mouth every 12 (twelve) hours. FOR MOOD, document any behaviors with medication administration (attempted GDR)  . lamoTRIgine (LAMICTAL) 25 MG tablet Take 50 mg by mouth daily. 2 tabs (50 mg)  At 2 pm  . magnesium hydroxide (MILK OF MAGNESIA) 400 MG/5ML suspension Take 30 mLs by mouth every 4 (four) hours as needed for mild constipation. Constipation / no BM for 2 days  . Magnesium Oxide 250 MG TABS Take 250 mg by mouth 2 (two) times daily.   . NON FORMULARY Diet Type : Mechanical Soft Diet (All upper teeth are now extracted; no plans for Dentures)  . pantoprazole (PROTONIX) 20 MG tablet Take 20 mg by mouth every morning.   . polycarbophil (FIBERCON) 625 MG tablet Take 1,250 mg by mouth 2 (two) times daily. Take with 8 oz of water  . potassium chloride (K-DUR,KLOR-CON) 10 MEQ tablet Take 20 mEq by mouth daily. 2 caps = 20 meq , May open and sprinkle on food  . propranolol (INDERAL) 10 MG tablet Take 10 mg by mouth 3 (three) times daily.  . sennosides-docusate sodium (SENOKOT-S) 8.6-50 MG tablet Take 1  tablet by mouth 2 (two) times daily.  . tamsulosin (FLOMAX) 0.4 MG CAPS Take 0.4 mg by mouth at bedtime.   . traMADol (ULTRAM) 50 MG tablet Take 1 tablet (50 mg total) by mouth 2 (two) times daily as needed.  . traMADol (ULTRAM) 50  MG tablet Take 1 tablet (50 mg total) by mouth 3 (three) times daily. Hold for sedation   No facility-administered encounter medications on file as of 01/11/2018.      SIGNIFICANT DIAGNOSTIC EXAMS  LABS REVIEWED: PREVIOUS:   07-28-17: wbc 5.4; hgb 13.3; hct 39.5; mcv 88.7; plt 162; glucose 92; bun 27; creat 0.97; k+ 4.1; na++ 135; ca 9.1 liver normal albumin 4.1; mag 1.6; tsh 1.839; vit B 12: 347; vit D 31.9  NO NEW LABS.    Review of Systems  Unable to perform ROS: Dementia (unable to fully participate )    Physical Exam  Constitutional: He appears well-developed and well-nourished. No distress.  Neck: No thyromegaly present.  Cardiovascular: Normal rate, regular rhythm, normal heart sounds and intact distal pulses.  Pulmonary/Chest: Effort normal and breath sounds normal. No respiratory distress.  Abdominal: Soft. Bowel sounds are normal. He exhibits no distension. There is no tenderness.  Musculoskeletal: He exhibits no edema.  Is able to move all extremities Is out of bed to geri-chair   Lymphadenopathy:    He has no cervical adenopathy.  Neurological: He is alert.  Skin: Skin is warm and dry. He is not diaphoretic.  Psychiatric: He has a normal mood and affect.      ASSESSMENT/ PLAN:  TODAY:   1. 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 Chronic constipation: is stable will continue fibercon 1250 mg twice daily and senna s twice daily   3. BPH with urinary frequency: is stable will continue flomax 0.4 mg daily   4.  Major depressive disorder, recurrent, in remission/mood disorder: is stable will continue prozac 30 mg daily  lamictal 25 mg twice daily and 50 mg in the afternoon   PREVIOUS   5. Chronic generalized pain disorder: is stable will continue tylenol 1 gm three times daily ultram 50 mg three times daily and twice daily as needed; neurontin 100 mg three times daily and baclofen 10 mg three times daily   6. Hypokalemia: is stable will continue k+ 20 meq daily   7. Cerebral artery occlusion with cerebral infarction: is stable will continue plavix 75 mg daily   8. Systolic congestive heart failure, chronic: is stable will continue inderal 10 mg three times daily   9. Hypertensive heart disease with chronic systolic congestive heart failure: is stable b/p 109/70: will continue clonidine 0.05 mg daily   10. Gastroesophageal reflux disease without esophagitis: is stable will continue protonix 20 mg daily   Will check PSA: hep C; FOBT: will give prevnar 13  MD is aware of resident's narcotic use and is in agreement with current plan of care. We will attempt to wean resident as apropriate   Synthia Innocent NP [facility]  Contact [phone] Monday through Friday 8am- 5pm  After hours call [phone]

## 2018-01-12 ENCOUNTER — Other Ambulatory Visit
Admission: RE | Admit: 2018-01-12 | Discharge: 2018-01-12 | Disposition: A | Discharge status: Home / Self Care | Payer: Medicare Other | Source: Ambulatory Visit | Attending: Adult Health | Admitting: Adult Health

## 2018-01-12 DIAGNOSIS — F1097 Alcohol use, unspecified with alcohol-induced persisting dementia: Secondary | ICD-10-CM | POA: Diagnosis present

## 2018-01-12 LAB — PSA: Prostatic Specific Antigen: 0.77 ng/mL (ref 0.00–4.00)

## 2018-01-13 LAB — HEPATITIS C ANTIBODY: HCV Ab: <0.1 s/co ratio (ref 0.0–0.9)

## 2018-02-01 ENCOUNTER — Other Ambulatory Visit
Admission: RE | Admit: 2018-02-01 | Discharge: 2018-02-01 | Disposition: A | Discharge status: Home / Self Care | Payer: Medicare Other | Source: Skilled Nursing Facility | Attending: Internal Medicine | Admitting: Internal Medicine

## 2018-02-01 DIAGNOSIS — D739 Disease of spleen, unspecified: Secondary | ICD-10-CM | POA: Insufficient documentation

## 2018-02-01 LAB — OCCULT BLOOD X 1 CARD TO LAB, STOOL: Fecal Occult Bld: NEGATIVE

## 2018-02-03 ENCOUNTER — Other Ambulatory Visit: Payer: Self-pay | Admitting: Adult Health

## 2018-02-03 MED ORDER — TRAMADOL HCL 50 MG PO TABS: 50.0000 mg | ORAL_TABLET | Freq: Three times a day (TID) | ORAL | 0 refills | Status: DC

## 2018-02-03 MED ORDER — TRAMADOL HCL 50 MG PO TABS: 50.0000 mg | ORAL_TABLET | Freq: Two times a day (BID) | ORAL | 0 refills | Status: DC | PRN

## 2018-02-07 ENCOUNTER — Encounter
Admission: RE | Admit: 2018-02-07 | Discharge: 2018-02-07 | Disposition: A | Discharge status: Home / Self Care | Payer: Medicare Other | Source: Ambulatory Visit | Attending: Internal Medicine | Admitting: Internal Medicine

## 2018-02-09 ENCOUNTER — Non-Acute Institutional Stay (SKILLED_NURSING_FACILITY): Payer: Medicare Other | Admitting: Adult Health

## 2018-02-09 ENCOUNTER — Encounter: Payer: Self-pay | Admitting: Adult Health

## 2018-02-09 DIAGNOSIS — I6350 Cerebral infarction due to unspecified occlusion or stenosis of unspecified cerebral artery: Secondary | ICD-10-CM | POA: Diagnosis not present

## 2018-02-09 DIAGNOSIS — R52 Pain, unspecified: Secondary | ICD-10-CM | POA: Diagnosis not present

## 2018-02-09 DIAGNOSIS — I5022 Chronic systolic (congestive) heart failure: Secondary | ICD-10-CM | POA: Diagnosis not present

## 2018-02-09 DIAGNOSIS — E876 Hypokalemia: Secondary | ICD-10-CM | POA: Diagnosis not present

## 2018-02-09 DIAGNOSIS — G8929 Other chronic pain: Secondary | ICD-10-CM

## 2018-02-09 NOTE — Progress Notes (Addendum)
Location:   [facility] Room Number: 320 Place of Service:  SNF (31)   CODE STATUS: full code   Allergies  Allergen Reactions  . Cefazolin Rash  . Penicillins     REACTION: unknown allergy - hx since childhood    Chief Complaint  Patient presents with  . Medical Management of Chronic Issues    Chronic systolic congestive heart failure; cerebral artery occlusion with cerebral infarction; chronic generalized pain disorder; hypokalemia.     HPI:  He is a 67 year old long term resident of this facility being seen for the management of her chronic illnesses: heart failure; cva; chronic pain; hypokalemia. He is unable to fully participate in the hpi or ros.  There are no reports of agitation or anxiety; no uncontrolled pain; no changes in appetite.   Past Medical History:  Diagnosis Date  . Alcoholic hepatitis 2001  . Anxiety   . Chronic pain   . 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 Depression, major, in remission (HCC) 05/10/2015  . Epistaxis 2001   required cauterization  . Fracture of left humerus   . GERD (gastroesophageal reflux disease)   . History of alcoholism (HCC)   . History of pancreatitis 05/10/2015  . Hypertonicity of bladder   . Lumbar spondylitis (HCC)   . Osteoporosis 05/10/2015  . Stroke (HCC)   . 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   . Thrombus of left atrial appendage 2003  . Urinary incontinence     Past Surgical History:  Procedure Laterality Date  . FEMUR IM NAIL Left 06/25/2012   Procedure: INTRAMEDULLARY (IM) NAIL FEMORAL;  Surgeon: Shelda Pal, MD;  Location: [facility];  Service: Orthopedics;  Laterality: Left;  . HAND SURGERY Bilateral 2003   crush injuries with multitrauma  . KNEE SURGERY  1996    Social History   Socioeconomic History  . Marital status: Divorced    Spouse name: Not on file  . Number of children: 0  . Years of education: Not on file  . Highest  education level: Not on file  Occupational History  . Not on file  Social Needs  . Financial resource strain: Not on file  . Food insecurity:    Worry: Not on file    Inability: Not on file  . Transportation needs:    Medical: Not on file    Non-medical: Not on file  Tobacco Use  . Smoking status: Never Smoker  . Smokeless tobacco: Never Used  Substance and Sexual Activity  . Alcohol use: Not Currently    Comment: former drinker (6 pack a week)  . Drug use: No  . Sexual activity: Not on file  Lifestyle  . Physical activity:    Days per week: Not on file    Minutes per session: Not on file  . Stress: Not on file  Relationships  . Social connections:    Talks on phone: Not on file    Gets together: Not on file    Attends religious service: Not on file    Active member of club or organization: Not on file    Attends meetings of clubs or organizations: Not on file    Relationship status: Not on file  . Intimate partner violence:    Fear of current or ex partner: Not on file    Emotionally abused: Not on file    Physically abused: Not on file    Forced  sexual activity: Not on file  Other Topics Concern  . Not on file  Social History Narrative   [facility]   Divorced   No children   Former drinker   Never smoker   Full Code   Family History  Problem Relation Age of Onset  . Diabetes type II Mother   . Hypertension Mother   . Diabetes type II Father   . Hypertension Father   . Dementia Father   . Hypertension Sister       VITAL SIGNS BP 122/90   Pulse 88   Temp (!) 97.1 F (36.2 C)   Resp 18   Ht 5\' 5"  (1.651 m)   Wt 153 lb 6.4 oz (69.6 kg)   SpO2 96%   BMI 25.53 kg/m   Outpatient Encounter Medications as of 02/09/2018  Medication Sig  . acetaminophen (TYLENOL) 500 MG tablet Take 2 tablets (1,000 mg total) by mouth 3 (three) times daily.  . baclofen (LIORESAL) 10 MG tablet Take 10 mg by mouth 3 (three) times daily.  .  cholecalciferol (VITAMIN D) 1000 UNITS tablet Take 2,000 Units by mouth every morning. 2 tabs  . cloNIDine (CATAPRES) 0.1 MG tablet Take 0.05 mg by mouth daily. 1/2 tablet  . clopidogrel (PLAVIX) 75 MG tablet Take 75 mg by mouth every morning.   . cyanocobalamin 1000 MCG tablet Take 1,000 mcg by mouth daily.  Marland Kitchen. FLUoxetine (PROZAC) 10 MG tablet Take 10 mg by mouth daily. Give in addition to 20 mg tab to = 30 mg total  . FLUoxetine (PROZAC) 20 MG tablet Take 20 mg by mouth daily.   . folic acid (FOLVITE) 400 MCG tablet Take 400 mcg by mouth daily.  Marland Kitchen. gabapentin (NEURONTIN) 100 MG capsule Take 200 mg by mouth 3 (three) times daily.   . Infant Care Products [facility] EX) Apply liberal amount topically to area of skin irritation prn. OK to leave at bedside.  . lamoTRIgine (LAMICTAL) 25 MG tablet Take 25 mg by mouth every 12 (twelve) hours. FOR MOOD, document any behaviors with medication administration (attempted GDR)  . lamoTRIgine (LAMICTAL) 25 MG tablet Take 50 mg by mouth daily. 2 tabs (50 mg)  At 2 pm  . magnesium hydroxide (MILK OF MAGNESIA) 400 MG/5ML suspension Take 30 mLs by mouth every 4 (four) hours as needed for mild constipation. Constipation / no BM for 2 days  . Magnesium Oxide 250 MG TABS Take 250 mg by mouth 2 (two) times daily.   . NON FORMULARY Diet Type : Mechanical Soft Diet (All upper teeth are now extracted; no plans for Dentures)  . pantoprazole (PROTONIX) 20 MG tablet Take 20 mg by mouth every morning.   . polycarbophil (FIBERCON) 625 MG tablet Take 1,250 mg by mouth 2 (two) times daily. Take with 8 oz of water  . potassium chloride (K-DUR,KLOR-CON) 10 MEQ tablet Take 20 mEq by mouth daily. 2 caps = 20 meq , May open and sprinkle on food  . propranolol (INDERAL) 10 MG tablet Take 10 mg by mouth 3 (three) times daily.  . sennosides-docusate sodium (SENOKOT-S) 8.6-50 MG tablet Take 1 tablet by mouth 2 (two) times daily.  . tamsulosin (FLOMAX) 0.4 MG CAPS Take 0.4 mg by mouth at  bedtime.   . traMADol (ULTRAM) 50 MG tablet Take 1 tablet (50 mg total) by mouth 3 (three) times daily. And also 50 mg twice daily as needed   No facility-administered encounter medications on file as  of 02/09/2018.      SIGNIFICANT DIAGNOSTIC EXAMS   LABS REVIEWED: PREVIOUS:   07-28-17: wbc 5.4; hgb 13.3; hct 39.5; mcv 88.7; plt 162; glucose 92; bun 27; creat 0.97; k+ 4.1; na++ 135; ca 9.1 liver normal albumin 4.1; mag 1.6; tsh 1.839; vit B 12: 347; vit D 31.9  TODAY:   01-12-18: hepatitis C <0.1  PSA 0.77 01-31-18: FOBT:  Neg    Review of Systems  Unable to perform ROS: Dementia (unable to participate )    Physical Exam  Constitutional: He appears well-developed and well-nourished. No distress.  Neck: No thyromegaly present.  Cardiovascular: Normal rate, regular rhythm, normal heart sounds and intact distal pulses.  Pulmonary/Chest: Effort normal and breath sounds normal. No respiratory distress.  Abdominal: Soft. Bowel sounds are normal. He exhibits no distension. There is no tenderness.  Musculoskeletal: He exhibits no edema.  Is able to move all extremities Is out of bed to geri-chair    Lymphadenopathy:    He has no cervical adenopathy.  Neurological: He is alert.  Skin: Skin is warm and dry. He is not diaphoretic.  Psychiatric: He has a normal mood and affect.     ASSESSMENT/ PLAN:  TODAY:   1. Chronic generalized pain disorder: is stable will continue tylenol 1 gm three times daily ultram 50 mg three times dily and twice daily as needed; neurontin 200 mg three times daily and baclofen 10 mg three times daily   2. Hypokalemia: is stable k+ 4.1  will continue k+ 20 meq daily   3. Cerebral artery occlusion with cerebral infarction: is stable will continue plavix 75 mg daily   4. Systolic congestive heart failure, chronic: is stable will continue inderal 10 mg three times daily    PREVIOUS   5. Hypertensive heart disease with chronic systolic congestive heart  failure: is stable b/p 122/90: will continue clonidine 0.05 mg daily   6. Gastroesophageal reflux disease without esophagitis: is stable will continue protonix 20 mg daily   7. 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 Chronic constipation: is stable will continue fibercon 1250 mg twice daily and senna s twice daily   9. BPH with urinary frequency: PSA 0.77 is stable will continue flomax 0.4 mg daily   10.  Major depressive disorder, recurrent, in remission/mood disorder: is stable will continue prozac 30 mg daily lamictal 25 mg twice daily and 50 mg in the afternoon    Will check cbc; cmp ;vit B 12 and vit D   MD is aware of resident's narcotic use and is in agreement with current plan of care. We will attempt to wean resident as apropriate   Synthia Innocent NP [facility]  Contact [phone] Monday through Friday 8am- 5pm  After hours call [phone]

## 2018-02-10 ENCOUNTER — Other Ambulatory Visit
Admission: RE | Admit: 2018-02-10 | Discharge: 2018-02-10 | Disposition: A | Discharge status: Home / Self Care | Payer: Medicare Other | Source: Ambulatory Visit | Attending: Adult Health | Admitting: Adult Health

## 2018-02-10 DIAGNOSIS — E871 Hypo-osmolality and hyponatremia: Secondary | ICD-10-CM | POA: Insufficient documentation

## 2018-02-10 LAB — CBC WITH DIFFERENTIAL/PLATELET
Abs Immature Granulocytes: 0.02 10*3/uL (ref 0.00–0.07)
BASOS ABS: 0 10*3/uL (ref 0.0–0.1)
BASOS PCT: 0 %
EOS ABS: 0.1 10*3/uL (ref 0.0–0.5)
EOS PCT: 2 %
HCT: 39.8 % (ref 39.0–52.0)
Hemoglobin: 13.4 g/dL (ref 13.0–17.0)
Immature Granulocytes: 0 %
LYMPHS PCT: 39 %
Lymphs Abs: 2.1 10*3/uL (ref 0.7–4.0)
MCH: 30.7 pg (ref 26.0–34.0)
MCHC: 33.7 g/dL (ref 30.0–36.0)
MCV: 91.1 fL (ref 80.0–100.0)
MONO ABS: 0.4 10*3/uL (ref 0.1–1.0)
Monocytes Relative: 7 %
NRBC: 0 % (ref 0.0–0.2)
Neutro Abs: 2.8 10*3/uL (ref 1.7–7.7)
Neutrophils Relative %: 52 %
Platelets: 150 10*3/uL (ref 150–400)
RBC: 4.37 MIL/uL (ref 4.22–5.81)
RDW: 11.9 % (ref 11.5–15.5)
WBC: 5.4 10*3/uL (ref 4.0–10.5)

## 2018-02-10 LAB — COMPREHENSIVE METABOLIC PANEL
ALBUMIN: 3.9 g/dL (ref 3.5–5.0)
ALT: 16 U/L (ref 0–44)
ANION GAP: 7 (ref 5–15)
AST: 15 U/L (ref 15–41)
Alkaline Phosphatase: 52 U/L (ref 38–126)
BUN: 34 mg/dL — ABNORMAL HIGH (ref 8–23)
CALCIUM: 9.3 mg/dL (ref 8.9–10.3)
CO2: 25 mmol/L (ref 22–32)
Chloride: 109 mmol/L (ref 98–111)
Creatinine, Ser: 0.89 mg/dL (ref 0.61–1.24)
GFR calc non Af Amer: >60 mL/min (ref >60)
GLUCOSE: 95 mg/dL (ref 70–99)
POTASSIUM: 4.1 mmol/L (ref 3.5–5.1)
Sodium: 141 mmol/L (ref 135–145)
TOTAL PROTEIN: 6.8 g/dL (ref 6.5–8.1)
Total Bilirubin: 0.6 mg/dL (ref 0.3–1.2)

## 2018-02-10 LAB — VITAMIN B12: Vitamin B-12: 692 pg/mL (ref 180–914)

## 2018-02-11 LAB — VITAMIN D 25 HYDROXY (VIT D DEFICIENCY, FRACTURES): VIT D 25 HYDROXY: 27.8 ng/mL — AB (ref 30.0–100.0)

## 2018-02-16 ENCOUNTER — Non-Acute Institutional Stay (SKILLED_NURSING_FACILITY): Payer: Medicare Other | Admitting: Adult Health

## 2018-02-16 ENCOUNTER — Encounter: Payer: Self-pay | Admitting: Adult Health

## 2018-02-16 DIAGNOSIS — F1097 Alcohol use, unspecified with alcohol-induced persisting dementia: Secondary | ICD-10-CM

## 2018-02-16 DIAGNOSIS — I5022 Chronic systolic (congestive) heart failure: Secondary | ICD-10-CM | POA: Diagnosis not present

## 2018-02-16 DIAGNOSIS — I6350 Cerebral infarction due to unspecified occlusion or stenosis of unspecified cerebral artery: Secondary | ICD-10-CM | POA: Diagnosis not present

## 2018-02-16 NOTE — Progress Notes (Signed)
Location:   [facility] Room Number: 323 A Place of Service:  SNF (31)   CODE STATUS: Full Code  Allergies  Allergen Reactions  . Cefazolin Rash  . Penicillins     REACTION: unknown allergy - hx since childhood    Chief Complaint  Patient presents with  . Acute Visit    Care Plan Meeting    HPI:  We have come together for his routine care plan meeting. He is unable to participate and does not have family present. We have reviewed his care plan; his activities; his medications. He has had one fall without any injury; he has been slowly gaining weight over the past several months. There are no reports of agitation; no reports of uncontrolled pain; he has a good appetite. He continues to be followed for his chronic illnesses including: cva; hypertensive heart disease; systolic heart failure.   Past Medical History:  Diagnosis Date  . Alcoholic hepatitis 2001  . Anxiety   . Chronic pain   . 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 Depression, major, in remission (HCC) 05/10/2015  . Epistaxis 2001   required cauterization  . Fracture of left humerus   . GERD (gastroesophageal reflux disease)   . History of alcoholism (HCC)   . History of pancreatitis 05/10/2015  . Hypertonicity of bladder   . Lumbar spondylitis (HCC)   . Osteoporosis 05/10/2015  . Stroke (HCC)   . 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   . Thrombus of left atrial appendage 2003  . Urinary incontinence     Past Surgical History:  Procedure Laterality Date  . FEMUR IM NAIL Left 06/25/2012   Procedure: INTRAMEDULLARY (IM) NAIL FEMORAL;  Surgeon: Shelda Pal, MD;  Location: [facility];  Service: Orthopedics;  Laterality: Left;  . HAND SURGERY Bilateral 2003   crush injuries with multitrauma  . KNEE SURGERY  1996    Social History   Socioeconomic History  . Marital status: Divorced    Spouse name: Not on file  . Number of  children: 0  . Years of education: Not on file  . Highest education level: Not on file  Occupational History  . Not on file  Social Needs  . Financial resource strain: Not on file  . Food insecurity:    Worry: Not on file    Inability: Not on file  . Transportation needs:    Medical: Not on file    Non-medical: Not on file  Tobacco Use  . Smoking status: Never Smoker  . Smokeless tobacco: Never Used  Substance and Sexual Activity  . Alcohol use: Not Currently    Comment: former drinker (6 pack a week)  . Drug use: No  . Sexual activity: Not on file  Lifestyle  . Physical activity:    Days per week: Not on file    Minutes per session: Not on file  . Stress: Not on file  Relationships  . Social connections:    Talks on phone: Not on file    Gets together: Not on file    Attends religious service: Not on file    Active member of club or organization: Not on file    Attends meetings of clubs or organizations: Not on file    Relationship status: Not on file  . Intimate partner violence:    Fear of current or ex partner: Not on file    Emotionally abused: Not  on file    Physically abused: Not on file    Forced sexual activity: Not on file  Other Topics Concern  . Not on file  Social History Narrative   Admitted to [facility] 07/15/2012   Divorced   No children   Former drinker   Never smoker   Full Code   Family History  Problem Relation Age of Onset  . Diabetes type II Mother   . Hypertension Mother   . Diabetes type II Father   . Hypertension Father   . Dementia Father   . Hypertension Sister       VITAL SIGNS BP 120/68   Pulse 80   Temp (!) 97.2 F (36.2 C)   Resp 19   Ht 5\' 5"  (1.651 m)   Wt 153 lb 6.4 oz (69.6 kg)   SpO2 99%   BMI 25.53 kg/m   Outpatient Encounter Medications as of 02/16/2018  Medication Sig  . acetaminophen (TYLENOL) 500 MG tablet Take 2 tablets (1,000 mg total) by mouth 3 (three) times daily.  . baclofen (LIORESAL)  10 MG tablet Take 10 mg by mouth 3 (three) times daily.  . cholecalciferol (VITAMIN D) 1000 UNITS tablet Take 2,000 Units by mouth every morning. 2 tabs  . cloNIDine (CATAPRES) 0.1 MG tablet Take 0.05 mg by mouth daily. 1/2 tablet  . clopidogrel (PLAVIX) 75 MG tablet Take 75 mg by mouth every morning.   . cyanocobalamin 1000 MCG tablet Take 1,000 mcg by mouth daily.  Marland Kitchen FLUoxetine (PROZAC) 10 MG tablet Take 10 mg by mouth daily. Give in addition to 20 mg tab to = 30 mg total  . FLUoxetine (PROZAC) 20 MG tablet Take 20 mg by mouth daily.   . folic acid (FOLVITE) 400 MCG tablet Take 400 mcg by mouth daily.  Marland Kitchen gabapentin (NEURONTIN) 100 MG capsule Take 200 mg by mouth 3 (three) times daily.   . Infant Care Products [facility] EX) Apply liberal amount topically to area of skin irritation prn. OK to leave at bedside.  . lamoTRIgine (LAMICTAL) 25 MG tablet Take 25 mg by mouth every 12 (twelve) hours. FOR MOOD, document any behaviors with medication administration (attempted GDR)  . lamoTRIgine (LAMICTAL) 25 MG tablet Take 50 mg by mouth daily. 2 tabs (50 mg)  At 2 pm  . magnesium hydroxide (MILK OF MAGNESIA) 400 MG/5ML suspension Take 30 mLs by mouth every 4 (four) hours as needed for mild constipation. Constipation / no BM for 2 days  . Magnesium Oxide 250 MG TABS Take 250 mg by mouth 2 (two) times daily.   . NON FORMULARY Diet Type : Mechanical Soft Diet (All upper teeth are now extracted; no plans for Dentures)  . pantoprazole (PROTONIX) 20 MG tablet Take 20 mg by mouth every morning.   . polycarbophil (FIBERCON) 625 MG tablet Take 1,250 mg by mouth 2 (two) times daily. Take with 8 oz of water  . potassium chloride (K-DUR,KLOR-CON) 10 MEQ tablet Take 20 mEq by mouth daily. 2 caps = 20 meq , May open and sprinkle on food  . propranolol (INDERAL) 10 MG tablet Take 10 mg by mouth 3 (three) times daily.  . sennosides-docusate sodium (SENOKOT-S) 8.6-50 MG tablet Take 1 tablet by mouth 2 (two) times daily.   . tamsulosin (FLOMAX) 0.4 MG CAPS Take 0.4 mg by mouth at bedtime.   . traMADol (ULTRAM) 50 MG tablet Take 1 tablet (50 mg total) by mouth 3 (three) times daily. And also 50  mg twice daily as needed   No facility-administered encounter medications on file as of 02/16/2018.      SIGNIFICANT DIAGNOSTIC EXAMS  LABS REVIEWED: PREVIOUS:   07-28-17: wbc 5.4; hgb 13.3; hct 39.5; mcv 88.7; plt 162; glucose 92; bun 27; creat 0.97; k+ 4.1; na++ 135; ca 9.1 liver normal albumin 4.1; mag 1.6; tsh 1.839; vit B 12: 347; vit D 31.9 01-12-18: hepatitis C <0.1  PSA 0.77 01-31-18: FOBT:  Neg  TODAY:   02-10-18: wbc  5.4; hgb 13.4; hct 39.8; mcv: 91.1; plt 150; glucose 95; bun 34; creat 0.89; k+ 4.1; na++ 141; ca 9.3; liver normal albumin 3.8; vit D 27.8; vit B 12: 692  Review of Systems  Unable to perform ROS: Dementia (unable to participate )   Physical Exam Constitutional:      General: He is not in acute distress.    Appearance: Normal appearance. He is well-developed. He is not diaphoretic.  Neck:     Musculoskeletal: Neck supple.     Thyroid: No thyromegaly.  Cardiovascular:     Rate and Rhythm: Normal rate and regular rhythm.     Pulses: Normal pulses.     Heart sounds: Normal heart sounds.  Pulmonary:     Effort: Pulmonary effort is normal. No respiratory distress.     Breath sounds: Normal breath sounds.  Abdominal:     General: Bowel sounds are normal. There is no distension.     Palpations: Abdomen is soft.     Tenderness: There is no abdominal tenderness.  Musculoskeletal:     Right lower leg: No edema.     Left lower leg: No edema.     Comments: Is able to move all extremities Is out of bed to geri-chair    Lymphadenopathy:     Cervical: No cervical adenopathy.  Skin:    General: Skin is warm and dry.  Neurological:     Mental Status: He is alert. Mental status is at baseline.  Psychiatric:        Mood and Affect: Mood normal.      ASSESSMENT/ PLAN:  TODAY:   1.  Chronic generalized pain disorder: 2. Cerebral artery occlusion with cerebral infarction:  3. Systolic congestive heart failure, chronic:  4.  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    MD is aware of resident's narcotic use and is in agreement with current plan of care. We will attempt to wean resident as apropriate   Synthia Innocenteborah Green NP [facility]  Contact [phone] Monday through Friday 8am- 5pm  After hours call [phone]

## 2018-03-15 ENCOUNTER — Other Ambulatory Visit: Payer: Self-pay | Admitting: Adult Health

## 2018-03-15 ENCOUNTER — Non-Acute Institutional Stay (SKILLED_NURSING_FACILITY): Payer: Medicare Other | Admitting: Adult Health

## 2018-03-15 ENCOUNTER — Encounter: Payer: Self-pay | Admitting: Adult Health

## 2018-03-15 DIAGNOSIS — F1097 Alcohol use, unspecified with alcohol-induced persisting dementia: Secondary | ICD-10-CM

## 2018-03-15 DIAGNOSIS — R310 Gross hematuria: Secondary | ICD-10-CM | POA: Diagnosis not present

## 2018-03-15 DIAGNOSIS — K219 Gastro-esophageal reflux disease without esophagitis: Secondary | ICD-10-CM

## 2018-03-15 DIAGNOSIS — I110 Hypertensive heart disease with heart failure: Secondary | ICD-10-CM

## 2018-03-15 DIAGNOSIS — I5022 Chronic systolic (congestive) heart failure: Secondary | ICD-10-CM

## 2018-03-15 MED ORDER — TRAMADOL HCL 50 MG PO TABS: 50.0000 mg | ORAL_TABLET | Freq: Three times a day (TID) | ORAL | 0 refills | Status: DC

## 2018-03-15 NOTE — Progress Notes (Signed)
Location:   [facility] Room Number: 323 A Place of Service:  SNF (31)   CODE STATUS: Full Code  Allergies  Allergen Reactions  . Cefazolin Rash  . Penicillins     REACTION: unknown allergy - hx since childhood    Chief Complaint  Patient presents with  . Medical Management of Chronic Issues    Hypertensive heart disease with chronic systolic congestive heart failure; gastroesophageal reflux disease without esophagitis; alcohol use with alcohol-induced persistent dementia.    HPI:  He is a 68 year old long term resident of this facility being seen for the management of his chronic illnesses: hypertensive heart disease; gerd; dementia. Staff reports that he is having hematuria. He has had this in the past; with a workup done at this facility his family have decided for no further workup. There are no other signs/symptoms of UTI. There are no reports of uncontrolled pain; no reports of agitation.   Past Medical History:  Diagnosis Date  . Alcoholic hepatitis 2001  . Anxiety   . Chronic pain   . 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 Depression, major, in remission (HCC) 05/10/2015  . Epistaxis 2001   required cauterization  . Fracture of left humerus   . GERD (gastroesophageal reflux disease)   . History of alcoholism (HCC)   . History of pancreatitis 05/10/2015  . Hypertonicity of bladder   . Lumbar spondylitis (HCC)   . Osteoporosis 05/10/2015  . Stroke (HCC)   . 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   . Thrombus of left atrial appendage 2003  . Urinary incontinence     Past Surgical History:  Procedure Laterality Date  . FEMUR IM NAIL Left 06/25/2012   Procedure: INTRAMEDULLARY (IM) NAIL FEMORAL;  Surgeon: Shelda Pal, MD;  Location: [facility];  Service: Orthopedics;  Laterality: Left;  . HAND SURGERY Bilateral 2003   crush injuries with multitrauma  . KNEE SURGERY  1996    Social  History   Socioeconomic History  . Marital status: Divorced    Spouse name: Not on file  . Number of children: 0  . Years of education: Not on file  . Highest education level: Not on file  Occupational History  . Not on file  Social Needs  . Financial resource strain: Not on file  . Food insecurity:    Worry: Not on file    Inability: Not on file  . Transportation needs:    Medical: Not on file    Non-medical: Not on file  Tobacco Use  . Smoking status: Never Smoker  . Smokeless tobacco: Never Used  Substance and Sexual Activity  . Alcohol use: Not Currently    Comment: former drinker (6 pack a week)  . Drug use: No  . Sexual activity: Not on file  Lifestyle  . Physical activity:    Days per week: Not on file    Minutes per session: Not on file  . Stress: Not on file  Relationships  . Social connections:    Talks on phone: Not on file    Gets together: Not on file    Attends religious service: Not on file    Active member of club or organization: Not on file    Attends meetings of clubs or organizations: Not on file    Relationship status: Not on file  . Intimate partner violence:    Fear of current or ex  partner: Not on file    Emotionally abused: Not on file    Physically abused: Not on file    Forced sexual activity: Not on file  Other Topics Concern  . Not on file  Social History Narrative   [facility]   Divorced   No children   Former drinker   Never smoker   Full Code   Family History  Problem Relation Age of Onset  . Diabetes type II Mother   . Hypertension Mother   . Diabetes type II Father   . Hypertension Father   . Dementia Father   . Hypertension Sister       VITAL SIGNS BP (!) 148/60   Pulse 90   Temp 98 F (36.7 C)   Resp 20   Ht 5\' 5"  (1.651 m)   Wt 153 lb 6.4 oz (69.6 kg)   SpO2 95%   BMI 25.53 kg/m   Outpatient Encounter Medications as of 03/15/2018  Medication Sig  . acetaminophen (TYLENOL)  500 MG tablet Take 2 tablets (1,000 mg total) by mouth 3 (three) times daily.  . baclofen (LIORESAL) 10 MG tablet Take 10 mg by mouth 3 (three) times daily.  . cholecalciferol (VITAMIN D) 1000 UNITS tablet Take 2,000 Units by mouth every morning. 2 tabs  . cloNIDine (CATAPRES) 0.1 MG tablet Take 0.05 mg by mouth daily. 1/2 tablet  . clopidogrel (PLAVIX) 75 MG tablet Take 75 mg by mouth every morning.   . cyanocobalamin 1000 MCG tablet Take 1,000 mcg by mouth daily.  Marland Kitchen. FLUoxetine (PROZAC) 10 MG tablet Take 10 mg by mouth daily. Give in addition to 20 mg tab to = 30 mg total  . FLUoxetine (PROZAC) 20 MG tablet Take 20 mg by mouth daily.   . folic acid (FOLVITE) 400 MCG tablet Take 400 mcg by mouth daily.  Marland Kitchen. gabapentin (NEURONTIN) 100 MG capsule Take 200 mg by mouth 3 (three) times daily.   . Infant Care Products [facility] EX) Apply liberal amount topically to area of skin irritation prn. OK to leave at bedside.  . lamoTRIgine (LAMICTAL) 25 MG tablet Take 25 mg by mouth every 12 (twelve) hours. FOR MOOD, document any behaviors with medication administration (attempted GDR)  . lamoTRIgine (LAMICTAL) 25 MG tablet Take 50 mg by mouth daily. 2 tabs (50 mg)  At 2 pm  . magnesium hydroxide (MILK OF MAGNESIA) 400 MG/5ML suspension Take 30 mLs by mouth every 4 (four) hours as needed for mild constipation. Constipation / no BM for 2 days  . Magnesium Oxide 250 MG TABS Take 250 mg by mouth 2 (two) times daily.   . NON FORMULARY Diet Type : Mechanical Soft Diet (All upper teeth are now extracted; no plans for Dentures)  . pantoprazole (PROTONIX) 20 MG tablet Take 20 mg by mouth every morning.   . polycarbophil (FIBERCON) 625 MG tablet Take 1,250 mg by mouth 2 (two) times daily. Take with 8 oz of water  . potassium chloride (K-DUR,KLOR-CON) 10 MEQ tablet Take 20 mEq by mouth daily. 2 caps = 20 meq , May open and sprinkle on food  . propranolol (INDERAL) 10 MG tablet Take 10 mg by mouth 3 (three) times daily.    . sennosides-docusate sodium (SENOKOT-S) 8.6-50 MG tablet Take 1 tablet by mouth 2 (two) times daily.  . tamsulosin (FLOMAX) 0.4 MG CAPS Take 0.4 mg by mouth at bedtime.   . traMADol (ULTRAM) 50 MG tablet Take 1 tablet (50  mg total) by mouth 3 (three) times daily. And also 50 mg twice daily as needed   No facility-administered encounter medications on file as of 03/15/2018.      SIGNIFICANT DIAGNOSTIC EXAMS  LABS REVIEWED: PREVIOUS:   07-28-17: wbc 5.4; hgb 13.3; hct 39.5; mcv 88.7; plt 162; glucose 92; bun 27; creat 0.97; k+ 4.1; na++ 135; ca 9.1 liver normal albumin 4.1; mag 1.6; tsh 1.839; vit B 12: 347; vit D 31.9 01-12-18: hepatitis C <0.1  PSA 0.77 01-31-18: FOBT:  Neg 02-10-18: wbc  5.4; hgb 13.4; hct 39.8; mcv: 91.1; plt 150; glucose 95; bun 34; creat 0.89; k+ 4.1; na++ 141; ca 9.3; liver normal albumin 3.8; vit D 27.8; vit B 12: 692   NO NEW LABS    Review of Systems  Unable to perform ROS: Dementia (unable to participate )    Physical Exam Constitutional:      General: He is not in acute distress.    Appearance: He is well-developed. He is not diaphoretic.  Neck:     Musculoskeletal: Neck supple.     Thyroid: No thyromegaly.  Cardiovascular:     Rate and Rhythm: Normal rate and regular rhythm.     Pulses: Normal pulses.     Heart sounds: Normal heart sounds.  Pulmonary:     Effort: Pulmonary effort is normal. No respiratory distress.     Breath sounds: Normal breath sounds.  Abdominal:     General: Bowel sounds are normal. There is no distension.     Palpations: Abdomen is soft.     Tenderness: There is no abdominal tenderness.  Genitourinary:    Comments: Did have small amount of blood present in brief.  Musculoskeletal:     Right lower leg: No edema.     Left lower leg: No edema.     Comments:  Is able to move all extremities Is out of bed to geri-chair     Lymphadenopathy:     Cervical: No cervical adenopathy.  Skin:    General: Skin is warm and dry.   Neurological:     Mental Status: He is alert. Mental status is at baseline.  Psychiatric:        Mood and Affect: Mood normal.      ASSESSMENT/ PLAN:  TODAY:   1. Hypertensive heart disease with chronic systolic congestive heart failure: is stable b/p 148/60: will continue clonidine 0.05 mg daily   2. Gastroesophageal reflux disease without esophagitis: is stable will continue protonix 20 mg daily   3. 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 Gross hematuria: is without change: has long history of intermittent hematuria; his family has decided for no further workup.    PREVIOUS   5. Chronic constipation: is stable will continue fibercon 1250 mg twice daily and senna s twice daily   6. BPH with urinary frequency: PSA 0.77 is stable will continue flomax 0.4 mg daily   7.  Major depressive disorder, recurrent, in remission/mood disorder: is stable will continue prozac 30 mg daily lamictal 25 mg twice daily and 50 mg in the afternoon   8. Chronic generalized pain disorder: is stable will continue tylenol 1 gm three times daily ultram 50 mg three times dily and twice daily as needed; neurontin 200 mg three times daily and baclofen 10 mg three times daily   9. Hypokalemia: is stable k+ 4.1  will continue  k+ 20 meq daily   10. Cerebral artery occlusion with cerebral infarction: is stable will continue plavix 75 mg daily   11. Systolic congestive heart failure, chronic: is stable will continue inderal 10 mg three times daily   12. Chronic pancreatitis: is stable no recent flares will monitor    MD is aware of resident's narcotic use and is in agreement with current plan of care. We will attempt to wean resident as apropriate   Synthia Innocenteborah  NP [facility]  Contact [phone] Monday through Friday 8am- 5pm  After hours call  [phone]

## 2018-03-16 DIAGNOSIS — R310 Gross hematuria: Secondary | ICD-10-CM | POA: Insufficient documentation

## 2018-04-10 ENCOUNTER — Encounter
Admission: RE | Admit: 2018-04-10 | Discharge: 2018-04-10 | Disposition: A | Discharge status: Home / Self Care | Payer: Medicare Other | Source: Ambulatory Visit | Attending: Internal Medicine | Admitting: Internal Medicine

## 2018-04-15 ENCOUNTER — Non-Acute Institutional Stay (SKILLED_NURSING_FACILITY): Payer: Medicare Other | Admitting: Adult Health

## 2018-04-15 ENCOUNTER — Encounter: Payer: Self-pay | Admitting: Adult Health

## 2018-04-15 DIAGNOSIS — K5909 Other constipation: Secondary | ICD-10-CM | POA: Diagnosis not present

## 2018-04-15 DIAGNOSIS — R52 Pain, unspecified: Secondary | ICD-10-CM

## 2018-04-15 DIAGNOSIS — G8929 Other chronic pain: Secondary | ICD-10-CM

## 2018-04-15 DIAGNOSIS — F3340 Major depressive disorder, recurrent, in remission, unspecified: Secondary | ICD-10-CM | POA: Diagnosis not present

## 2018-04-15 DIAGNOSIS — N401 Enlarged prostate with lower urinary tract symptoms: Secondary | ICD-10-CM | POA: Diagnosis not present

## 2018-04-15 DIAGNOSIS — R350 Frequency of micturition: Secondary | ICD-10-CM

## 2018-04-15 NOTE — Progress Notes (Signed)
[facility]   [facility] Room Number: 323 A Place of Service:  SNF (31)   CODE STATUS: Full Code  Allergies  Allergen Reactions  . Cefazolin Rash  . Penicillins     REACTION: unknown allergy - hx since childhood    Chief Complaint  Patient presents with  . Medical Management of Chronic Issues    Chronic generalized pain; major depressive disorder recurrent in remission; benign prostatic hyperplasia with urinary frequency; chronic constipation.     HPI:  He is a 68 year old long term resident of this facility being seen for the management of his chronic illnesses: pain; depression; bph; constipation. There are no reports of constipation; no changes in appetite; no reports of anxiety or agitation.   Past Medical History:  Diagnosis Date  . Alcoholic hepatitis 2001  . Anxiety   . Chronic pain   . 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 Depression, major, in remission (HCC) 05/10/2015  . Epistaxis 2001   required cauterization  . Fracture of left humerus   . GERD (gastroesophageal reflux disease)   . History of alcoholism (HCC)   . History of pancreatitis 05/10/2015  . Hypertonicity of bladder   . Lumbar spondylitis (HCC)   . Osteoporosis 05/10/2015  . Stroke (HCC)   . 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   . Thrombus of left atrial appendage 2003  . Urinary incontinence     Past Surgical History:  Procedure Laterality Date  . FEMUR IM NAIL Left 06/25/2012   Procedure: INTRAMEDULLARY (IM) NAIL FEMORAL;  Surgeon: Shelda Pal, MD;  Location: [facility];  Service: Orthopedics;  Laterality: Left;  . HAND SURGERY Bilateral 2003   crush injuries with multitrauma  . KNEE SURGERY  1996    Social History   Socioeconomic History  . Marital status: Divorced    Spouse name: Not on file  . Number of children: 0  . Years of education: Not on file  . Highest education level: Not on file    Occupational History  . Not on file  Social Needs  . Financial resource strain: Not on file  . Food insecurity:    Worry: Not on file    Inability: Not on file  . Transportation needs:    Medical: Not on file    Non-medical: Not on file  Tobacco Use  . Smoking status: Never Smoker  . Smokeless tobacco: Never Used  Substance and Sexual Activity  . Alcohol use: Not Currently    Comment: former drinker (6 pack a week)  . Drug use: No  . Sexual activity: Not on file  Lifestyle  . Physical activity:    Days per week: Not on file    Minutes per session: Not on file  . Stress: Not on file  Relationships  . Social connections:    Talks on phone: Not on file    Gets together: Not on file    Attends religious service: Not on file    Active member of club or organization: Not on file    Attends meetings of clubs or organizations: Not on file    Relationship status: Not on file  . Intimate partner violence:    Fear of current or ex partner: Not on file    Emotionally abused: Not on file    Physically abused: Not on file    Forced sexual activity: Not on file  Other Topics Concern  .  Not on file  Social History Narrative   Admitted to [facility] 07/15/2012   Divorced   No children   Former drinker   Never smoker   Full Code   Family History  Problem Relation Age of Onset  . Diabetes type II Mother   . Hypertension Mother   . Diabetes type II Father   . Hypertension Father   . Dementia Father   . Hypertension Sister       VITAL SIGNS BP 134/67   Pulse 73   Temp (!) 97.4 F (36.3 C)   Resp 18   Ht 5\' 5"  (1.651 m)   Wt 153 lb 6.4 oz (69.6 kg)   SpO2 94%   BMI 25.53 kg/m   Outpatient Encounter Medications as of 04/15/2018  Medication Sig  . acetaminophen (TYLENOL) 500 MG tablet Take 2 tablets (1,000 mg total) by mouth 3 (three) times daily.  . baclofen (LIORESAL) 10 MG tablet Take 10 mg by mouth 3 (three) times daily.  . cholecalciferol (VITAMIN D)  1000 UNITS tablet Take 4,000 Units by mouth every morning.   . cloNIDine (CATAPRES) 0.1 MG tablet Take 0.05 mg by mouth daily. 1/2 tablet  . clopidogrel (PLAVIX) 75 MG tablet Take 75 mg by mouth every morning.   . cyanocobalamin 1000 MCG tablet Take 1,000 mcg by mouth daily.  Marland Kitchen FLUoxetine (PROZAC) 10 MG tablet Take 10 mg by mouth daily. Give in addition to 20 mg tab to = 30 mg total  . FLUoxetine (PROZAC) 20 MG tablet Take 20 mg by mouth daily.   . folic acid (FOLVITE) 400 MCG tablet Take 400 mcg by mouth daily.  Marland Kitchen gabapentin (NEURONTIN) 100 MG capsule Take 200 mg by mouth 3 (three) times daily.   . Infant Care Products [facility] EX) Apply liberal amount topically to area of skin irritation prn. OK to leave at bedside.  . lamoTRIgine (LAMICTAL) 25 MG tablet Take 25 mg by mouth every 12 (twelve) hours. FOR MOOD, document any behaviors with medication administration (attempted GDR)  . lamoTRIgine (LAMICTAL) 25 MG tablet Take 50 mg by mouth daily. 2 tabs (50 mg)  At 2 pm  . magnesium hydroxide (MILK OF MAGNESIA) 400 MG/5ML suspension Take 30 mLs by mouth every 4 (four) hours as needed for mild constipation. Constipation / no BM for 2 days  . Magnesium Oxide 250 MG TABS Take 250 mg by mouth 2 (two) times daily.   . NON FORMULARY Diet Type : Mechanical Soft Diet (All upper teeth are now extracted; no plans for Dentures)  . pantoprazole (PROTONIX) 20 MG tablet Take 20 mg by mouth every morning.   . polycarbophil (FIBERCON) 625 MG tablet Take 1,250 mg by mouth 2 (two) times daily. Take with 8 oz of water  . potassium chloride (K-DUR,KLOR-CON) 10 MEQ tablet Take 20 mEq by mouth daily. 2 caps = 20 meq , May open and sprinkle on food  . propranolol (INDERAL) 10 MG tablet Take 10 mg by mouth 3 (three) times daily.  . sennosides-docusate sodium (SENOKOT-S) 8.6-50 MG tablet Take 1 tablet by mouth 2 (two) times daily.  . tamsulosin (FLOMAX) 0.4 MG CAPS Take 0.4 mg by mouth at bedtime.   . traMADol (ULTRAM)  50 MG tablet Take 1 tablet (50 mg total) by mouth 3 (three) times daily. And also 50 mg twice daily as needed   No facility-administered encounter medications on file as of 04/15/2018.      SIGNIFICANT DIAGNOSTIC EXAMS  LABS  REVIEWED: PREVIOUS:   07-28-17: wbc 5.4; hgb 13.3; hct 39.5; mcv 88.7; plt 162; glucose 92; bun 27; creat 0.97; k+ 4.1; na++ 135; ca 9.1 liver normal albumin 4.1; mag 1.6; tsh 1.839; vit B 12: 347; vit D 31.9 01-12-18: hepatitis C <0.1  PSA 0.77 01-31-18: FOBT:  Neg 02-10-18: wbc  5.4; hgb 13.4; hct 39.8; mcv: 91.1; plt 150; glucose 95; bun 34; creat 0.89; k+ 4.1; na++ 141; ca 9.3; liver normal albumin 3.8; vit D 27.8; vit B 12: 692   NO NEW LABS   Review of Systems  Unable to perform ROS: Dementia (unable to participate )    Physical Exam Constitutional:      General: He is not in acute distress.    Appearance: He is well-developed. He is not diaphoretic.  Neck:     Thyroid: No thyromegaly.  Cardiovascular:     Rate and Rhythm: Normal rate and regular rhythm.     Pulses: Normal pulses.     Heart sounds: Normal heart sounds.  Pulmonary:     Effort: Pulmonary effort is normal. No respiratory distress.     Breath sounds: Normal breath sounds.  Abdominal:     General: Bowel sounds are normal. There is no distension.     Palpations: Abdomen is soft.     Tenderness: There is no abdominal tenderness.  Musculoskeletal:     Right lower leg: No edema.     Left lower leg: No edema.     Comments: Is able to move all extremities Is out of bed to geri-chair      Lymphadenopathy:     Cervical: No cervical adenopathy.  Skin:    General: Skin is warm and dry.  Neurological:     Mental Status: He is alert. Mental status is at baseline.  Psychiatric:        Mood and Affect: Mood normal.      ASSESSMENT/ PLAN:  TODAY:   1. Chronic constipation: is stable will continue fibercon 1250 mg twice daily and senna s twice daily   2. BPH with urinary frequency: PSA  0.77 is stable will continue flomax 0.4 mg daily   3.  Major depressive disorder, recurrent, in remission/mood disorder: is stable will continue prozac 30 mg daily lamictal 25 mg twice daily and 50 mg in the afternoon   4. Chronic generalized pain disorder: is stable will continue tylenol 1 gm three times daily ultram 50 mg three times daily and twice daily as needed; neurontin 200 mg three times daily and baclofen 10 mg three times daily   PREVIOUS   5. Hypokalemia: is stable k+ 4.1  will continue k+ 20 meq daily   6. Cerebral artery occlusion with cerebral infarction: is stable will continue plavix 75 mg daily   7. Systolic congestive heart failure, chronic: is stable will continue inderal 10 mg three times daily   8. Chronic pancreatitis: is stable no recent flares will monitor   9. Hypertensive heart disease with chronic systolic congestive heart failure: is stable b/p 134/67: will continue clonidine 0.05 mg daily   10. Gastroesophageal reflux disease without esophagitis: is stable will continue protonix 20 mg daily   11. 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 Gross hematuria: is without change: has long history of intermittent hematuria; his family has decided for no further workup.   Not a candidate for a colonoscopy; will get  FOBT and will give a prevnar 13.    MD is aware of resident's narcotic use and is in agreement with current plan of care. We will attempt to wean resident as apropriate   Synthia Innocenteborah Green NP [facility]  Contact [phone] Monday through Friday 8am- 5pm  After hours call [phone]

## 2018-04-23 ENCOUNTER — Other Ambulatory Visit: Payer: Self-pay | Admitting: Adult Health

## 2018-04-23 MED ORDER — TRAMADOL HCL 50 MG PO TABS: 50.0000 mg | ORAL_TABLET | Freq: Three times a day (TID) | ORAL | 0 refills | Status: DC

## 2018-04-26 ENCOUNTER — Other Ambulatory Visit
Admission: RE | Admit: 2018-04-26 | Discharge: 2018-04-26 | Disposition: A | Discharge status: Home / Self Care | Payer: Medicare Other | Source: Ambulatory Visit | Attending: Adult Health | Admitting: Adult Health

## 2018-04-26 DIAGNOSIS — D62 Acute posthemorrhagic anemia: Secondary | ICD-10-CM | POA: Diagnosis present

## 2018-04-26 LAB — OCCULT BLOOD X 1 CARD TO LAB, STOOL: FECAL OCCULT BLD: NEGATIVE

## 2018-05-03 ENCOUNTER — Non-Acute Institutional Stay (SKILLED_NURSING_FACILITY): Payer: Medicare Other | Admitting: Adult Health

## 2018-05-03 ENCOUNTER — Encounter: Payer: Self-pay | Admitting: Adult Health

## 2018-05-03 DIAGNOSIS — K219 Gastro-esophageal reflux disease without esophagitis: Secondary | ICD-10-CM

## 2018-05-03 DIAGNOSIS — R52 Pain, unspecified: Secondary | ICD-10-CM | POA: Diagnosis not present

## 2018-05-03 DIAGNOSIS — I6350 Cerebral infarction due to unspecified occlusion or stenosis of unspecified cerebral artery: Secondary | ICD-10-CM | POA: Diagnosis not present

## 2018-05-03 DIAGNOSIS — R350 Frequency of micturition: Secondary | ICD-10-CM

## 2018-05-03 DIAGNOSIS — F3340 Major depressive disorder, recurrent, in remission, unspecified: Secondary | ICD-10-CM

## 2018-05-03 DIAGNOSIS — F01518 Vascular dementia, unspecified severity, with other behavioral disturbance: Secondary | ICD-10-CM

## 2018-05-03 DIAGNOSIS — E876 Hypokalemia: Secondary | ICD-10-CM

## 2018-05-03 DIAGNOSIS — N401 Enlarged prostate with lower urinary tract symptoms: Secondary | ICD-10-CM | POA: Diagnosis not present

## 2018-05-03 DIAGNOSIS — F0151 Vascular dementia with behavioral disturbance: Secondary | ICD-10-CM

## 2018-05-03 DIAGNOSIS — I10 Essential (primary) hypertension: Secondary | ICD-10-CM

## 2018-05-03 DIAGNOSIS — E538 Deficiency of other specified B group vitamins: Secondary | ICD-10-CM

## 2018-05-03 DIAGNOSIS — G8929 Other chronic pain: Secondary | ICD-10-CM

## 2018-05-03 MED ORDER — LAMOTRIGINE 25 MG PO TABS: 50.0000 mg | ORAL_TABLET | Freq: Every day | ORAL | 0 refills | Status: AC

## 2018-05-03 MED ORDER — LAMOTRIGINE 25 MG PO TABS: 25.0000 mg | ORAL_TABLET | Freq: Two times a day (BID) | ORAL | 0 refills | Status: AC

## 2018-05-03 MED ORDER — TRAMADOL HCL 50 MG PO TABS: 50.0000 mg | ORAL_TABLET | Freq: Three times a day (TID) | ORAL | 0 refills | Status: AC

## 2018-05-03 NOTE — Progress Notes (Signed)
[facility] Room Number: 323-A Place of Service:  SNF ([phone]) Provider:  Kenard Gower, NP  Patient Care Team: Lauro Regulus, MD as PCP - General (Unknown Physician Specialty) Durene Romans, MD as Consulting Physician (Orthopedic Surgery) Sharee Holster, NP as Nurse Practitioner (Geriatric Medicine)  Extended Emergency Contact Information Primary Emergency Contact: Tula Nakayama Address: [address]          [place], [place] [address] [place] of [place] Home Phone: [phone] Mobile Phone: [phone] Relation: Sister  Code Status:  Full Code  Goals of care: Advanced Directive information Advanced Directives 04/15/2018  Does Patient Have a Medical Advance Directive? No  Does patient want to make changes to medical advance directive? -  Would patient like information on creating a medical advance directive? No - Patient declined  Pre-existing out of facility DNR order (yellow form or pink MOST form) -     Chief Complaint  Patient presents with  . Discharge Note    Patient is to transfer to [facility]    HPI:  Pt is a 68 y.o. male seen today for a discharge visit.  He is transferring to [facility] in [place] on 05/04/2018.  He had been a long-term care resident of [facility].  He has a PMH of alcoholic hepatitis, anxiety, chronic pain, alcoholism, pancreatitis, substance abuse (marijuana and cocaine), dementia associated with alcoholism, and depression.     Past Medical History:  Diagnosis Date  . Alcoholic hepatitis 2001  . Anxiety   . Chronic pain   . 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 Depression, major, in remission (HCC) 05/10/2015  . Epistaxis 2001   required cauterization  . Fracture of left humerus   . GERD (gastroesophageal reflux disease)   . History of alcoholism (HCC)   . History of pancreatitis 05/10/2015  . Hypertonicity of bladder   .  Lumbar spondylitis (HCC)   . Osteoporosis 05/10/2015  . Stroke (HCC)   . 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   . Thrombus of left atrial appendage 2003  . Urinary incontinence    Past Surgical History:  Procedure Laterality Date  . FEMUR IM NAIL Left 06/25/2012   Procedure: INTRAMEDULLARY (IM) NAIL FEMORAL;  Surgeon: Shelda Pal, MD;  Location: [facility];  Service: Orthopedics;  Laterality: Left;  . HAND SURGERY Bilateral 2003   crush injuries with multitrauma  . KNEE SURGERY  1996    Allergies  Allergen Reactions  . Cefazolin Rash  . Penicillins     REACTION: unknown allergy - hx since childhood    Outpatient Encounter Medications as of 05/03/2018  Medication Sig  . acetaminophen (TYLENOL) 500 MG tablet Take 2 tablets (1,000 mg total) by mouth 3 (three) times daily.  . baclofen (LIORESAL) 10 MG tablet Take 10 mg by mouth 3 (three) times daily.  . cholecalciferol (VITAMIN D) 1000 UNITS tablet Take 4,000 Units by mouth every morning.   . cloNIDine (CATAPRES) 0.1 MG tablet Take 0.05 mg by mouth daily. 1/2 tablet  . clopidogrel (PLAVIX) 75 MG tablet Take 75 mg by mouth every morning.   . cyanocobalamin 1000 MCG tablet Take 1,000 mcg by mouth daily.  Marland Kitchen FLUoxetine (PROZAC) 10 MG tablet Take 10 mg by mouth daily. Give in addition to 20 mg tab to = 30 mg total  . FLUoxetine (PROZAC) 20 MG tablet Take 20 mg by mouth daily.   . folic acid (  FOLVITE) 400 MCG tablet Take 400 mcg by mouth daily.  Marland Kitchen gabapentin (NEURONTIN) 100 MG capsule Take 200 mg by mouth 3 (three) times daily.   . Infant Care Products [facility] EX) Apply liberal amount topically to area of skin irritation prn. OK to leave at bedside.  . lamoTRIgine (LAMICTAL) 25 MG tablet Take 1 tablet (25 mg total) by mouth every 12 (twelve) hours for 5 days.  Marland Kitchen lamoTRIgine (LAMICTAL) 25 MG tablet Take 2 tablets (50 mg total) by mouth daily for 5 days. 2 tabs (50 mg)  At 2 pm  . magnesium hydroxide (MILK OF MAGNESIA) 400 MG/5ML  suspension Take 30 mLs by mouth every 4 (four) hours as needed for mild constipation. Constipation / no BM for 2 days  . Magnesium Oxide 250 MG TABS Take 250 mg by mouth 2 (two) times daily.   . NON FORMULARY Diet Type : Mechanical Soft Diet (All upper teeth are now extracted; no plans for Dentures)  . pantoprazole (PROTONIX) 20 MG tablet Take 20 mg by mouth every morning.   . polycarbophil (FIBERCON) 625 MG tablet Take 1,250 mg by mouth 2 (two) times daily. Take with 8 oz of water  . potassium chloride (MICRO-K) 10 MEQ CR capsule Take 20 mEq by mouth daily. Two capsules to = 20 mEq qd.  May open and sprinkle on food.  . propranolol (INDERAL) 10 MG tablet Take 10 mg by mouth 3 (three) times daily.  . sennosides-docusate sodium (SENOKOT-S) 8.6-50 MG tablet Take 1 tablet by mouth 2 (two) times daily.  . tamsulosin (FLOMAX) 0.4 MG CAPS Take 0.4 mg by mouth at bedtime.   . traMADol (ULTRAM) 50 MG tablet Take 1 tablet (50 mg total) by mouth 3 (three) times daily for 5 days.  . [DISCONTINUED] lamoTRIgine (LAMICTAL) 25 MG tablet Take 25 mg by mouth every 12 (twelve) hours. FOR MOOD, document any behaviors with medication administration (attempted GDR)  . [DISCONTINUED] lamoTRIgine (LAMICTAL) 25 MG tablet Take 50 mg by mouth daily. 2 tabs (50 mg)  At 2 pm  . [DISCONTINUED] traMADol (ULTRAM) 50 MG tablet Take 1 tablet (50 mg total) by mouth 3 (three) times daily. And also 50 mg twice daily as needed (Patient taking differently: Take 50 mg by mouth 3 (three) times daily. )  . [DISCONTINUED] traMADol (ULTRAM) 50 MG tablet Take 50 mg by mouth 4 (four) times daily.  . [DISCONTINUED] potassium chloride (K-DUR,KLOR-CON) 10 MEQ tablet Take 20 mEq by mouth daily. 2 caps = 20 meq , May open and sprinkle on food   No facility-administered encounter medications on file as of 05/03/2018.     Review of Systems  Unable to obtain due to dementia   Immunization History  Administered Date(s) Administered  . Influenza  Whole 01/02/2010  . Influenza-Unspecified 12/04/2013, 11/24/2014, 12/05/2015, 12/04/2016, 01/08/2018  . PPD Test 12/21/2015, 12/08/2016  . Pneumococcal-Unspecified 10/14/2012  . Td 01/10/2010   Pertinent  Health Maintenance Due  Topic Date Due  . COLONOSCOPY  05/14/2018 (Originally 08/23/2000)  . PNA vac Low Risk Adult (1 of 2 - PCV13) 05/14/2018 (Originally 08/24/2015)  . INFLUENZA VACCINE  Completed   Fall Risk  02/19/2018 07/27/2012  Falls in the past year? 1 No  Number falls in past yr: 0 -  Injury with Fall? 0 -  Risk for fall due to : Impaired mobility;Medication side effect;Impaired balance/gait -  Follow up Falls evaluation completed -     Vitals:   05/03/18 0824  BP: 132/90  Pulse:  89  Resp: 18  Temp: (!) 97 F (36.1 C)  TempSrc: Oral  SpO2: 95%  Weight: 153 lb 6.4 oz (69.6 kg)  Height: 5\' 5"  (1.651 m)   Body mass index is 25.53 kg/m.  Physical Exam  GENERAL APPEARANCE: Well nourished. In no acute distress. Normal body habitus SKIN:  Skin is warm and dry.  MOUTH and THROAT: Lips are without lesions. Oral mucosa is moist and without lesions.  RESPIRATORY: Breathing is even & unlabored, BS CTAB CARDIAC: RRR, no murmur,no extra heart sounds, no edema GI: Abdomen soft, normal BS, no masses, no tenderness NEUROLOGICAL: There is no tremor. Speech is clear. Disoriented X 3. Left arm weakness. PSYCHIATRIC:  Affect and behavior are appropriate  Labs reviewed: Recent Labs    07/28/17 0600 02/10/18 0600  NA 135 141  K 4.1 4.1  CL 105 109  CO2 25 25  GLUCOSE 92 95  BUN 27* 34*  CREATININE 0.97 0.89  CALCIUM 9.1 9.3  MG 1.6*  --    Recent Labs    07/28/17 0600 02/10/18 0600  AST 22 15  ALT 18 16  ALKPHOS 47 52  BILITOT 0.7 0.6  PROT 7.4 6.8  ALBUMIN 4.1 3.9   Recent Labs    07/28/17 0600 02/10/18 0600  WBC 5.4 5.4  NEUTROABS 3.2 2.8  HGB 13.3 13.4  HCT 39.5* 39.8  MCV 88.7 91.1  PLT 162 150   Lab Results  Component Value Date   TSH 1.839  07/28/2017    Lab Results  Component Value Date   CHOL 86 07/12/2012   HDL 51 01/08/2010   LDLCALC 126 (H) 01/08/2010   TRIG 88 07/12/2012   CHOLHDL 3.8 Ratio 01/08/2010     Assessment/Plan  1. Cerebral artery occlusion with cerebral infarction (HCC) -Stable, continue Plavix 75 mg 1 tab daily  2. Benign prostatic hyperplasia with urinary frequency -Continue tamsulosin ER 24-hour 0.4 mg 1 capsule at bedtime  3. Major depressive disorder, recurrent, in remission (HCC) -Continue Prozac 10 mg 1 capsule and 20 mg 1 capsule = 30 mg total daily - lamoTRIgine (LAMICTAL) 25 MG tablet; Take 1 tablet (25 mg total) by mouth every 12 (twelve) hours for 5 days.  Dispense: 10 tablet; Refill: 0 - lamoTRIgine (LAMICTAL) 25 MG tablet; Take 2 tablets (50 mg total) by mouth daily for 5 days. 2 tabs (50 mg)  At 2 pm  Dispense: 10 tablet; Refill: 0  4. Chronic generalized pain disorder - traMADol (ULTRAM) 50 MG tablet; Take 1 tablet (50 mg total) by mouth 3 (three) times daily for 5 days.  Dispense: 15 tablet; Refill: 0 -Continue baclofen 10 mg 1 tab 3 times a day, Tylenol extra strength 1000 mg 3 times a day -Continue gabapentin 100 mg 2 capsules 3 times a day  5. Gastroesophageal reflux disease without esophagitis -Continue pantoprazole DR 20 mg 1 tablet daily  6. Vitamin B12 deficiency -Continue cyanocobalamin 1000 mcg 1 tab daily  7. Hypomagnesemia -Continue magnesium oxide 250 mg 1 tab twice a day  8. Hypokalemia - potassium chloride (MICRO-K) 10 MEQ CR capsule; Take 20 mEq by mouth daily. Two capsules to = 20 mEq qd.  May open and sprinkle on food.  9. Essential hypertension -Well-controlled, continue clonidine 0.1 mg 1/2 tab = 0.05 mg daily, propanolol 10 mg 1 tab 3 times a day   10. Vascular dementia without behavioral disturbance  -Continue supportive care and fall precautions   I have filled out patient's discharge paperwork and  e-prescribed Tramadol.  Total discharge time:  Greater than 30 minutes Greater than 50% was spent in counseling and coordination of care.   Discharge time involved coordination of the discharge process with Child psychotherapist and  nursing staff.   Kenard Gower, NP [facility] [phone] (Monday-Friday 8:00 a.m. - 5:00 p.m.) [phone] (after hours)
